# Patient Record
Sex: Female | Born: 1945
Health system: Southern US, Community
[De-identification: ages and names within clinical notes are randomized; demographics above are authoritative.]

## PROBLEM LIST (undated history)

## (undated) DIAGNOSIS — R7611 Nonspecific reaction to tuberculin skin test without active tuberculosis: Secondary | ICD-10-CM

## (undated) DIAGNOSIS — Z8719 Personal history of other diseases of the digestive system: Secondary | ICD-10-CM

## (undated) DIAGNOSIS — K649 Unspecified hemorrhoids: Secondary | ICD-10-CM

## (undated) DIAGNOSIS — K219 Gastro-esophageal reflux disease without esophagitis: Secondary | ICD-10-CM

## (undated) DIAGNOSIS — R112 Nausea with vomiting, unspecified: Secondary | ICD-10-CM

## (undated) DIAGNOSIS — E785 Hyperlipidemia, unspecified: Secondary | ICD-10-CM

## (undated) DIAGNOSIS — Z808 Family history of malignant neoplasm of other organs or systems: Secondary | ICD-10-CM

## (undated) DIAGNOSIS — Z9889 Other specified postprocedural states: Secondary | ICD-10-CM

## (undated) DIAGNOSIS — R898 Other abnormal findings in specimens from other organs, systems and tissues: Secondary | ICD-10-CM

## (undated) DIAGNOSIS — I10 Essential (primary) hypertension: Secondary | ICD-10-CM

## (undated) DIAGNOSIS — C50919 Malignant neoplasm of unspecified site of unspecified female breast: Secondary | ICD-10-CM

## (undated) DIAGNOSIS — M8580 Other specified disorders of bone density and structure, unspecified site: Secondary | ICD-10-CM

## (undated) DIAGNOSIS — M1990 Unspecified osteoarthritis, unspecified site: Secondary | ICD-10-CM

## (undated) DIAGNOSIS — C50911 Malignant neoplasm of unspecified site of right female breast: Secondary | ICD-10-CM

## (undated) DIAGNOSIS — R0602 Shortness of breath: Secondary | ICD-10-CM

## (undated) DIAGNOSIS — K589 Irritable bowel syndrome without diarrhea: Secondary | ICD-10-CM

## (undated) DIAGNOSIS — Z9289 Personal history of other medical treatment: Secondary | ICD-10-CM

## (undated) HISTORY — DX: Other specified disorders of bone density and structure, unspecified site: M85.80

## (undated) HISTORY — PX: TONSILLECTOMY AND ADENOIDECTOMY: SUR1326

## (undated) HISTORY — DX: Unspecified osteoarthritis, unspecified site: M19.90

## (undated) HISTORY — PX: UVULOPALATOPHARYNGOPLASTY, TONSILLECTOMY AND SEPTOPLASTY: SHX2632

## (undated) HISTORY — DX: Malignant neoplasm of unspecified site of right female breast: C50.911

## (undated) HISTORY — DX: Other abnormal findings in specimens from other organs, systems and tissues: R89.8

## (undated) HISTORY — DX: Essential (primary) hypertension: I10

## (undated) HISTORY — DX: Hyperlipidemia, unspecified: E78.5

## (undated) HISTORY — DX: Family history of malignant neoplasm of other organs or systems: Z80.8

---

## 1966-01-02 HISTORY — PX: DILATION AND CURETTAGE OF UTERUS: SHX78

## 1974-01-02 HISTORY — PX: TUBAL LIGATION: SHX77

## 1990-01-02 HISTORY — PX: CHOLECYSTECTOMY: SHX55

## 1992-01-03 HISTORY — PX: ESOPHAGOGASTRODUODENOSCOPY: SHX1529

## 1993-01-02 DIAGNOSIS — C50919 Malignant neoplasm of unspecified site of unspecified female breast: Secondary | ICD-10-CM

## 1993-01-02 HISTORY — PX: BREAST BIOPSY: SHX20

## 1993-01-02 HISTORY — DX: Malignant neoplasm of unspecified site of unspecified female breast: C50.919

## 1993-01-02 HISTORY — PX: BREAST LUMPECTOMY: SHX2

## 1993-11-08 DIAGNOSIS — Z853 Personal history of malignant neoplasm of breast: Secondary | ICD-10-CM

## 1993-12-06 HISTORY — PX: COLONOSCOPY: SHX174

## 1997-01-02 DIAGNOSIS — R7611 Nonspecific reaction to tuberculin skin test without active tuberculosis: Secondary | ICD-10-CM

## 1997-01-02 HISTORY — DX: Nonspecific reaction to tuberculin skin test without active tuberculosis: R76.11

## 1997-06-11 ENCOUNTER — Encounter: Admission: RE | Admit: 1997-06-11 | Discharge: 1997-09-09 | Payer: Self-pay | Admitting: Hematology and Oncology

## 1998-06-01 ENCOUNTER — Other Ambulatory Visit: Admission: RE | Admit: 1998-06-01 | Discharge: 1998-06-01 | Payer: Self-pay | Admitting: Obstetrics and Gynecology

## 1998-08-23 ENCOUNTER — Other Ambulatory Visit: Admission: RE | Admit: 1998-08-23 | Discharge: 1998-08-23 | Payer: Self-pay | Admitting: Obstetrics and Gynecology

## 1998-11-30 ENCOUNTER — Ambulatory Visit (HOSPITAL_COMMUNITY): Admission: RE | Admit: 1998-11-30 | Discharge: 1998-11-30 | Payer: Self-pay | Admitting: Gastroenterology

## 1998-11-30 ENCOUNTER — Encounter (INDEPENDENT_AMBULATORY_CARE_PROVIDER_SITE_OTHER): Payer: Self-pay | Admitting: *Deleted

## 1998-11-30 HISTORY — PX: COLONOSCOPY: SHX174

## 1999-03-16 ENCOUNTER — Other Ambulatory Visit: Admission: RE | Admit: 1999-03-16 | Discharge: 1999-03-16 | Payer: Self-pay | Admitting: Obstetrics and Gynecology

## 2000-05-07 ENCOUNTER — Other Ambulatory Visit: Admission: RE | Admit: 2000-05-07 | Discharge: 2000-05-07 | Payer: Self-pay | Admitting: Obstetrics and Gynecology

## 2001-11-27 ENCOUNTER — Ambulatory Visit: Admission: RE | Admit: 2001-11-27 | Discharge: 2001-11-27 | Payer: Self-pay | Admitting: Cardiovascular Disease

## 2002-04-08 ENCOUNTER — Encounter (HOSPITAL_COMMUNITY): Admission: RE | Admit: 2002-04-08 | Discharge: 2002-05-08 | Payer: Self-pay | Admitting: Pulmonary Disease

## 2002-11-06 ENCOUNTER — Ambulatory Visit (HOSPITAL_COMMUNITY): Admission: RE | Admit: 2002-11-06 | Discharge: 2002-11-06 | Payer: Self-pay | Admitting: Pulmonary Disease

## 2003-06-03 ENCOUNTER — Encounter (HOSPITAL_COMMUNITY): Admission: RE | Admit: 2003-06-03 | Discharge: 2003-07-03 | Payer: Self-pay | Admitting: Pulmonary Disease

## 2003-07-03 ENCOUNTER — Encounter (HOSPITAL_COMMUNITY): Admission: RE | Admit: 2003-07-03 | Discharge: 2003-08-02 | Payer: Self-pay | Admitting: Pulmonary Disease

## 2003-11-18 ENCOUNTER — Ambulatory Visit (HOSPITAL_COMMUNITY): Admission: RE | Admit: 2003-11-18 | Discharge: 2003-11-18 | Payer: Self-pay | Admitting: Obstetrics and Gynecology

## 2004-11-30 ENCOUNTER — Ambulatory Visit (HOSPITAL_COMMUNITY): Admission: RE | Admit: 2004-11-30 | Discharge: 2004-11-30 | Payer: Self-pay | Admitting: Pulmonary Disease

## 2005-05-24 ENCOUNTER — Emergency Department (HOSPITAL_COMMUNITY): Admission: EM | Admit: 2005-05-24 | Discharge: 2005-05-24 | Payer: Self-pay | Admitting: Emergency Medicine

## 2005-12-04 ENCOUNTER — Ambulatory Visit (HOSPITAL_COMMUNITY): Admission: RE | Admit: 2005-12-04 | Discharge: 2005-12-04 | Payer: Self-pay | Admitting: Pulmonary Disease

## 2005-12-28 ENCOUNTER — Ambulatory Visit (HOSPITAL_COMMUNITY): Admission: RE | Admit: 2005-12-28 | Discharge: 2005-12-28 | Payer: Self-pay | Admitting: Pulmonary Disease

## 2006-02-02 ENCOUNTER — Ambulatory Visit (HOSPITAL_COMMUNITY): Admission: RE | Admit: 2006-02-02 | Discharge: 2006-02-03 | Payer: Self-pay | Admitting: Otolaryngology

## 2006-02-02 ENCOUNTER — Encounter (INDEPENDENT_AMBULATORY_CARE_PROVIDER_SITE_OTHER): Payer: Self-pay | Admitting: Specialist

## 2006-02-19 ENCOUNTER — Observation Stay (HOSPITAL_COMMUNITY): Admission: EM | Admit: 2006-02-19 | Discharge: 2006-02-19 | Payer: Self-pay | Admitting: *Deleted

## 2006-03-01 ENCOUNTER — Ambulatory Visit (HOSPITAL_COMMUNITY): Admission: RE | Admit: 2006-03-01 | Discharge: 2006-03-01 | Payer: Self-pay | Admitting: Pulmonary Disease

## 2006-10-19 ENCOUNTER — Ambulatory Visit (HOSPITAL_COMMUNITY): Admission: RE | Admit: 2006-10-19 | Discharge: 2006-10-19 | Payer: Self-pay | Admitting: Pulmonary Disease

## 2006-10-26 ENCOUNTER — Ambulatory Visit (HOSPITAL_COMMUNITY): Admission: RE | Admit: 2006-10-26 | Discharge: 2006-10-26 | Payer: Self-pay | Admitting: Otolaryngology

## 2006-11-16 ENCOUNTER — Ambulatory Visit: Admission: RE | Admit: 2006-11-16 | Discharge: 2006-11-16 | Payer: Self-pay | Admitting: Otolaryngology

## 2006-11-26 ENCOUNTER — Ambulatory Visit (HOSPITAL_COMMUNITY): Admission: RE | Admit: 2006-11-26 | Discharge: 2006-11-26 | Payer: Self-pay | Admitting: Gastroenterology

## 2006-11-26 ENCOUNTER — Encounter (INDEPENDENT_AMBULATORY_CARE_PROVIDER_SITE_OTHER): Payer: Self-pay | Admitting: Gastroenterology

## 2006-11-26 HISTORY — PX: COLONOSCOPY: SHX5424

## 2006-12-06 ENCOUNTER — Ambulatory Visit: Payer: Self-pay | Admitting: Pulmonary Disease

## 2006-12-10 ENCOUNTER — Ambulatory Visit (HOSPITAL_COMMUNITY): Admission: RE | Admit: 2006-12-10 | Discharge: 2006-12-10 | Payer: Self-pay | Admitting: Obstetrics and Gynecology

## 2008-01-21 ENCOUNTER — Ambulatory Visit (HOSPITAL_COMMUNITY): Admission: RE | Admit: 2008-01-21 | Discharge: 2008-01-21 | Payer: Self-pay | Admitting: Obstetrics and Gynecology

## 2009-01-28 ENCOUNTER — Ambulatory Visit (HOSPITAL_COMMUNITY): Admission: RE | Admit: 2009-01-28 | Discharge: 2009-01-28 | Payer: Self-pay | Admitting: Obstetrics and Gynecology

## 2010-01-31 ENCOUNTER — Ambulatory Visit (HOSPITAL_COMMUNITY)
Admission: RE | Admit: 2010-01-31 | Discharge: 2010-01-31 | Payer: Self-pay | Source: Home / Self Care | Attending: Pulmonary Disease | Admitting: Pulmonary Disease

## 2010-03-21 ENCOUNTER — Other Ambulatory Visit (HOSPITAL_COMMUNITY): Payer: Self-pay | Admitting: Pulmonary Disease

## 2010-03-23 ENCOUNTER — Ambulatory Visit (HOSPITAL_COMMUNITY)
Admission: RE | Admit: 2010-03-23 | Discharge: 2010-03-23 | Disposition: A | Discharge status: Home / Self Care | Payer: 59 | Source: Ambulatory Visit | Attending: Pulmonary Disease | Admitting: Pulmonary Disease

## 2010-03-23 ENCOUNTER — Other Ambulatory Visit (HOSPITAL_COMMUNITY): Payer: Self-pay | Admitting: Pulmonary Disease

## 2010-03-23 DIAGNOSIS — Z853 Personal history of malignant neoplasm of breast: Secondary | ICD-10-CM | POA: Insufficient documentation

## 2010-03-23 DIAGNOSIS — N63 Unspecified lump in unspecified breast: Secondary | ICD-10-CM | POA: Insufficient documentation

## 2010-04-07 ENCOUNTER — Other Ambulatory Visit: Payer: Self-pay | Admitting: Surgery

## 2010-05-17 NOTE — Op Note (Signed)
NAMEDANA, DEBO              ACCOUNT NO.:  [id number]   MEDICAL RECORD NO.:  [id number]          PATIENT TYPE:  AMB   LOCATION:  ENDO                         FACILITY:  [facility]   PHYSICIAN:  John C. Madilyn Fireman, M.D.    DATE OF BIRTH:  1945/01/28   DATE OF PROCEDURE:  11/26/2006  DATE OF DISCHARGE:                               OPERATIVE REPORT   PROCEDURE:  Colonoscopy.   INDICATIONS FOR PROCEDURE:  Rectal bleeding in a patient with no colon  screening in the last 8 years.   PROCEDURE:  The patient was placed in the left lateral decubitus  position and placed on the pulse monitor, with continuous low-flow  oxygen delivered by nasal cannula.  She was sedated with 75 mcg IV  fentanyl and 7 mg IV Versed.  The Olympus video colonoscope was inserted  into the rectum and advanced to the cecum, confirmed by  transillumination at McBurney's point and visualization of the ileocecal  valve and appendiceal orifice.  Prep was excellent.  Cecum, ascending,  transverse, and descending colon all appeared normal, with no masses,  polyps, diverticula, or other mucosal abnormalities.  In the sigmoid  colon, there was an 8 mm polyp which was removed by snare.  The  remainder of the sigmoid and rectum appeared normal down to the anus,  where retroflexed view revealed some small internal hemorrhoids.  The  scope was then withdrawn, and the patient returned to the recovery room  in stable condition.  She tolerated the procedure well, and there were  no immediate complications.   IMPRESSION:  1. Sigmoid colon polyp.  2. Small internal hemorrhoids.   PLAN:  We will await histology to determine interval for future colon  screening.   Compartment           ______________________________  Everardo All. Madilyn Fireman, M.D.     J[facility]/MEDQ  D:  11/26/2006  T:  11/26/2006  Job:  [id number]   cc:   Ramon Dredge L. Juanetta Gosling, M.D.

## 2010-05-17 NOTE — Procedures (Signed)
Amy Houston, Amy Houston              ACCOUNT NO.:  [id number]   MEDICAL RECORD NO.:  [id number]          PATIENT TYPE:  OUT   LOCATION:  SLEEP LAB                     FACILITY:  [facility]   PHYSICIAN:  Barbaraann Share, MD,FCCPDATE OF BIRTH:  01/08/1946   DATE OF STUDY:  11/16/2006                            NOCTURNAL POLYSOMNOGRAM   REFERRING PHYSICIAN:  Suzanna Obey, M.D.   REFERRING PHYSICIAN:  Dr. Suzanna Obey.   INDICATIONS FOR STUDY:  Hypersomnia with sleep apnea.  The patient is  status post upper airway surgery.   EPWORTH SCORE:  18   SLEEP ARCHITECTURE:  The patient has a total sleep time of 376 minutes  with very little slow-wave sleep and also decreased REM.  Sleep onset  latency was normal and REM onset was prolonged at 324 minutes.  Sleep  efficiency was fairly good at 92%.   RESPIRATORY DATA:  The patient was found to have 3 obstructive apneas  and 32 obstructive hypopneas for an apnea-hypopnea index of 6 events per  hour.  She was also found to have 12 respiratory effort-related  arousals, which gave her a respiratory disturbance index of 7.5 events  per hour.  The events were not positional and there was mild to moderate  snoring throughout.  The patient did not meet split night criteria  secondary to the small numbers of events.   OXYGEN DATA:  There was O2 desaturation as low as 85% with the patient's  obstructive events.   CARDIAC DATA:  Rare PACs, but no clinically significant arrhythmias were  noted.   MOVEMENT/PARASOMNIA:  The patient was found to have moderate numbers of  leg jerks with no significant sleep disruption.   IMPRESSION/RECOMMENDATIONS:  1. Mild obstructive sleep apnea/hypopnea syndrome with an apnea-      hypopnea index of 6 events per hour and a respiratory disturbance      index when including respiratory effort related arousals to 7.5 per      hour.  Treatment for this degree of sleep apnea can include weight      loss, upper airway surgery,  oral appliance, and also continuous      positive airway pressure.  Clinical correlation is suggested to see      if this is impacting the patient's quality of life.  This mild      degree of sleep apnea is not a cardiovascular risk for the patient.      Split night criteria were not      met secondary to the small numbers of events.  2. Rare premature atrial contractions without clinically significant      arrhythmia.      Barbaraann Share, MD,FCCP  Diplomate, American Board of Sleep  Medicine  Electronically Signed     [facility]/MEDQ  D:  12/06/2006 04:54:09  T:  12/06/2006 09:56:32  Job:  [id number]

## 2010-05-20 NOTE — Op Note (Signed)
Amy Houston, Amy Houston              ACCOUNT NO.:  [id number]   MEDICAL RECORD NO.:  [id number]          PATIENT TYPE:  OIB   LOCATION:  2550                         FACILITY:  [facility]   PHYSICIAN:  Suzanna Obey, M.D.       DATE OF BIRTH:  01/29/1945   DATE OF PROCEDURE:  02/02/2006  DATE OF DISCHARGE:                               OPERATIVE REPORT   PREOPERATIVE DIAGNOSIS:  Obstructive sleep apnea.   POSTOPERATIVE DIAGNOSIS:  Obstructive sleep apnea.   SURGICAL PROCEDURES:  Septoplasty, submucous resection of inferior  turbinates, uvulopharyngeopalatoplasty, and tonsillectomy.   SURGEON:  Suzanna Obey, M.D.   ANESTHESIA:  General.   ESTIMATED BLOOD LOSS:  Approximately 15 mL.   INDICATIONS:  This is a 65 year old, who has had obstructive sleep apnea  that has been refractory to CPAP treatment.  The patient now requires  surgery because she is having quite a lot of symptoms with respect to  the sleep apnea and needs treatment.  She was informed of the risks and  benefits of the procedure, including bleeding, infection, velopharyngeal  insufficiency, change in the voice, chronic pain, septal perforation,  change in the external appearance of the nose, chronic crusting and  drying, numbness of the teeth, and risks of the anesthetic.  All  questions were answered and consent was obtained.   OPERATION:  The patient was taken to the operating room and placed in  supine position.  After adequate general endotracheal tube anesthesia,  she was placed in the supine position and draped in the usual sterile  manner.  The nose was injected with 1% lidocaine with 1:100,000  epinephrine in the septum and inferior turbinates, and then a right  hemitransfixion incision was performed, raising a mucoperichondrial and  ostial flap.  The cartilage was divided about 2 cm posterior to the  caudal strut and the cartilage was removed with a Therapist, nutritional.  The  Jansen-Middleton forceps were used to remove  the deviated portion of the  bone.  A 4-mm osteotome was used to remove the septal spur.  The  hemitransfixion incision was closed with interrupted 4-0 chromic and a 4-  0 plain gut quilting stitch placed through the septum. The turbinates  were outfractured.  A midline incision was made with a 15 blade.  A  mucosal flap was elevated superiorly, and the inferior mucosa and bone  were removed with a turbinate scissors.  The edge was cauterized with  suction cautery and both flaps were laid back down over the raw surface  and outfractured.  Telfa rolls soaked in Bactroban were placed into the  nose bilaterally and secured with a 3-0 nylon.  The patient then had the  Crowe-Davis mouth gag inserted, retracted and suspended from the Mayo  stand.  An incision was begun at the base of the uvula and carried into  the left tonsillar fossa, where the capsule of the tonsil was identified  and removed with electrocautery dissection.  Then, the dissection was  carried across the midline to the right side, where, again, the tonsil  was removed along its capsule with electrocautery  dissection.  The  tonsils and uvula were removed en bloc as 1 specimen.  The suction  cautery was used to gain hemostasis.  The palate and tonsillar fossae  were  closed with interrupted 3-0 Vicryl.  The hypopharynx, esophagus and  stomach were suctioned with an NG tube.  The Crowe-Davis was released  and resuspended, and there was hemostasis present in all locations.  The  patient was awakened and brought to recovery in stable condition.  Counts were correct.           ______________________________  Suzanna Obey, M.D.     JB/MEDQ  D:  02/02/2006  T:  02/02/2006  Job:  [id number]

## 2010-05-20 NOTE — Consult Note (Signed)
Amy Houston, Amy Houston              ACCOUNT NO.:  [id number]   MEDICAL RECORD NO.:  [id number]          PATIENT TYPE:  OBV   LOCATION:  2550                         FACILITY:  [facility]   PHYSICIAN:  Zola Button T. Lazarus Salines, M.D. DATE OF BIRTH:  1945-10-21   DATE OF CONSULTATION:  02/19/2006  DATE OF DISCHARGE:                                 CONSULTATION   CHIEF COMPLAINT:  Right epistaxis.   HISTORY:  A 65 year old white female underwent septoplasty, reduction of  turbinates, tonsillectomy, and uvulectomy on February1 for sleep  apnea.  She was observed overnight in the hospital.  She has been doing  nicely since then was of throat pain exceeding the nose pain.  She has  started to breath though the nose to some degree.  Beginning last night,  she had bleeding from the right side of the nose which was copious.  She  went to the [facility] emergency room after several interventions, they  were unable to get control and sent her down here for further  consideration.  She has no known bleeding tendencies.  With the advice  of Dr. Jearld Fenton' nurse, she has been taking Motrin for additional pain  control over the past week and admits to taking as much as 800 mg q.4 h.  Two or three days ago, prior to the nose bleed, she noted that her stool  had turned dark black.  No belly pain.  No nausea or vomiting.  No  bright red blood in the stool.   PHYSICAL EXAMINATION:  She has blood clots in the right nose and a small  amount of old blood in the left side.  Also some old blood in the  pharynx.  Mental status appropriate.  She is breathing comfortably  through the mouth.   IMPRESSION:  Postoperative epistaxis, right, probably from the  turbinate.   PLAN:  She has been manipulated several times overnight and would prefer  to have a general anesthesia.  I think this is reasonable.  She has been  unable to evacuate the clots voluntarily which we can do better in the  operating room.   I discussed the  proposed procedure, namely cautery under anesthesia,  right nose.  Risks and complications were discussed.  Questions were  answered and informed consent was obtained.  A routine preoperative  history and physical was recorded without contraindications.  She  understands and agrees with the discussion and plans.      Gloris Manchester. Lazarus Salines, M.D.  Electronically Signed     KTW/MEDQ  D:  02/19/2006  T:  02/19/2006  Job:  [id number]   cc:   Suzanna Obey, M.D.  Edward L. Juanetta Gosling, M.D.

## 2010-05-20 NOTE — Procedures (Signed)
[place]  Patient:    Amy Houston                      MRN: [id number] Proc. Date: 11/30/98 Adm. Date:  [id number] Attending:  Louie Bun CC:         Elvina Sidle, M.D.                           Procedure Report  NAME OF PROCEDURE: Colonoscopy with polypectomy.  ENDOSCOPIST: Everardo All. Madilyn Fireman, M.D.  INDICATIONS FOR PROCEDURE: Patient with intermittent rectal bleeding, history of breast cancer and no colon screening times five years.  DESCRIPTION OF PROCEDURE: The patient was placed in the left lateral decubitus position and placed on the pulse monitor with continuous low flow oxygen delivered via nasal cannula. The patient was sedated with 75 mg of intravenous Demerol and 7 mg of intravenous Versed.  The Olympus video colonoscope was inserted into the rectum and advanced to the cecum, confirmed by transillumination in McBurneys point and visualization of the ileocecal valve and appendiceal orifice.  The prep was excellent.  The cecum, ascending, transverse, descending, and sigmoid colon all appeared normal.  There were no masses, polyps, diverticula or other mucosal abnormalities. The rectum likewise appeared normal to about 5 cm from the anal verge where a 5 cm sessile polyp was seen and fulgurated by hot biopsy.  There were also some small hemorrhoids seen on retroflex view.  The colonoscope was then withdrawn.  The patient was taken to the Recovery Room in stable condition.  She tolerated he procedure well and there were no immediate complications.  IMPRESSION: 1. Small rectal polyp. 2. Internal hemorrhoids.  PLAN: 1. Treat hemorrhoids symptomatically. 2. We will await histology on the polyp. DD:  11/30/98 TD:  11/30/98 Job: [id number]

## 2010-05-20 NOTE — Op Note (Signed)
NAMENEGAR, SIELER              ACCOUNT NO.:  [id number]   MEDICAL RECORD NO.:  [id number]          PATIENT TYPE:  OBV   LOCATION:  2550                         FACILITY:  [facility]   PHYSICIAN:  Zola Button T. Lazarus Salines, M.D. DATE OF BIRTH:  [birth year]   DATE OF PROCEDURE:  02/19/2006  DATE OF DISCHARGE:                               OPERATIVE REPORT   PREOPERATIVE DIAGNOSIS:  Right postoperative epistaxis.   POSTOPERATIVE DIAGNOSIS:  Right postoperative epistaxis.   PROCEDURE PERFORMED:  Cautery under anesthesia, right inferior  turbinate.   SURGEON:  Gloris Manchester. Lazarus Salines, M.D.   ANESTHESIA:  General orotracheal.   BLOOD LOSS:  Minimal.   COMPLICATIONS:  None.   FINDINGS:  The right nose filled with the stiff gelatinous clots  attached to clots in the nasopharynx.  These were finally evacuated with  suction.  The septum is still somewhat edematous but basically straight.  Moderate maxillary crest spurring on both sides.  Surgical reduction of  the inferior turbinates.  Bleeding from the mid-posterior aspect of the  mucosal edges of the right inferior turbinate.   PROCEDURE:  With the patient in a comfortable supine position, general  orotracheal anesthesia was induced without difficulty.  At an  appropriate level, the patient was placed in a slight sitting position.  A saline-moistened throat pack was placed.  Using a 10 Frazier tip  suction, clots were evacuated from the right nose piecemeal and finally  from the right pharynx.  There was moderate oozing from the inferior  turbinate.  Using suction cautery, the mucosal edges of the posterior  and midportion of the right inferior turbinate were coagulated, with  control of bleeding.  She was observed for several minutes, with no  additional bleeding.  The left side showed some residual eschar along  the inferior turbinate, which was cleared away, and some early synechia  formation between the inferior turbinate and the maxillary  crest, which  was also lysed, but otherwise no attention required on the left side.   After observing hemostasis, the pharynx was suctioned free, and the  throat pack was removed.  The patient was returned to anesthesia,  awakened, extubated, and transferred to recovery in stable condition.   COMMENT:  A 65 year old white female, now 2 weeks status post surgery to  the nose and throat for sleep apnea with onset of brisk right-sided  epistaxis consistent with turbinate bleeding was the indication for  today's procedure.  Anticipate a routine postoperative recovery with attention to ice,  elevation, and analgesia.  No antibiosis required.  She will need to be  back in touch with Dr. Juanetta Gosling for evaluation of melena probably  secondary to nonsteroidal anti-inflammatory therapy.      Gloris Manchester. Lazarus Salines, M.D.  Electronically Signed     KTW/MEDQ  D:  02/19/2006  T:  02/19/2006  Job:  [id number]   cc:   Ramon Dredge L. Juanetta Gosling, M.D.  Suzanna Obey, M.D.

## 2010-08-01 ENCOUNTER — Other Ambulatory Visit (HOSPITAL_COMMUNITY): Payer: Self-pay | Admitting: Pulmonary Disease

## 2010-08-01 ENCOUNTER — Ambulatory Visit (HOSPITAL_COMMUNITY)
Admission: RE | Admit: 2010-08-01 | Discharge: 2010-08-01 | Disposition: A | Discharge status: Home / Self Care | Payer: 59 | Source: Ambulatory Visit | Attending: Pulmonary Disease | Admitting: Pulmonary Disease

## 2010-08-01 DIAGNOSIS — M25569 Pain in unspecified knee: Secondary | ICD-10-CM | POA: Insufficient documentation

## 2010-08-01 DIAGNOSIS — M25561 Pain in right knee: Secondary | ICD-10-CM

## 2010-08-01 DIAGNOSIS — M6740 Ganglion, unspecified site: Secondary | ICD-10-CM | POA: Insufficient documentation

## 2010-08-03 ENCOUNTER — Other Ambulatory Visit (HOSPITAL_COMMUNITY): Payer: 59

## 2010-08-18 ENCOUNTER — Ambulatory Visit (INDEPENDENT_AMBULATORY_CARE_PROVIDER_SITE_OTHER): Payer: 59 | Admitting: Orthopedic Surgery

## 2010-08-18 ENCOUNTER — Encounter: Payer: Self-pay | Admitting: Orthopedic Surgery

## 2010-08-18 VITALS — Resp 16 | Ht 60.0 in | Wt 164.0 lb

## 2010-08-18 DIAGNOSIS — M7120 Synovial cyst of popliteal space [Baker], unspecified knee: Secondary | ICD-10-CM | POA: Insufficient documentation

## 2010-08-18 NOTE — Progress Notes (Signed)
CONSULT: REQUESTED BY Dr Shaune Pollack  Chief complaint: RIGHT knee pain HPI:(4) The patient reports the sudden onset of pain in the RIGHT knee after getting out of a chair back in July on the 28th.  Otherwise there is no other injury.  For approximately 1 week she took Aleve 3 times a day.  Initially had throbbing and burning with 8/10 pain which was worse at night.  The pain tends to come and go now and is worse when she is lying flat and or walking.  The pain is better when she is bending the knee.  ROS:(2)   She says she feels some tingling in her toes.She does have some wandering in her eyes.  She has some heart palpitations and heartburn.  Stiffness of the joint is reported as well.  PFSH: (1)   Diabetes diet controlled.  Spine arthritis.  Status postcholecystectomy and lumpectomy.  Physical Exam(12) GENERAL: normal development   CDV: pulses are normal   Skin: normal  Lymph: nodes were not palpable/normal  Psychiatric: awake, alert and oriented  Neuro: normal sensation  MSK RIGHT knee evaluation.  Ambulation is normal. 1 Inspection reveals no tenderness along the joint lines but there is some tenderness over the iliotibial band and over the popliteal tendon 2 Knee range of motion is normal 3 Strength is normal 4 Stability is normal 5 McMurray's test is negative  Imaging: MRI [facility]:   Small nonspecific joint effusion with a small ganglion cyst at  the posterior lateral corner which I suspect is leaking. The  possibility of synovitis should be considered since there is no  apparent internal derangement of the knee.   X-rays knee ( office) Very minimal degenerative changes with symmetric mild joint space no narrowing and normal alignment  Separately identifiable x-ray report  Knee pain  AP lateral and sunrise view of the patella  The joint alignment is normal.  The bone quality is normal. The joint spaces are preserved  The impression is normal knee.   Assessment:  Resolving popliteal cyst    Plan: Observation

## 2010-11-01 ENCOUNTER — Other Ambulatory Visit (HOSPITAL_COMMUNITY): Payer: Self-pay | Admitting: Pulmonary Disease

## 2010-11-01 DIAGNOSIS — Z09 Encounter for follow-up examination after completed treatment for conditions other than malignant neoplasm: Secondary | ICD-10-CM

## 2010-11-02 ENCOUNTER — Ambulatory Visit (HOSPITAL_COMMUNITY)
Admission: RE | Admit: 2010-11-02 | Discharge: 2010-11-02 | Disposition: A | Discharge status: Home / Self Care | Payer: 59 | Source: Ambulatory Visit | Attending: Dermatology | Admitting: Dermatology

## 2010-11-02 ENCOUNTER — Other Ambulatory Visit (HOSPITAL_COMMUNITY): Payer: Self-pay | Admitting: Dermatology

## 2010-11-02 DIAGNOSIS — Z09 Encounter for follow-up examination after completed treatment for conditions other than malignant neoplasm: Secondary | ICD-10-CM

## 2010-11-02 DIAGNOSIS — Z853 Personal history of malignant neoplasm of breast: Secondary | ICD-10-CM | POA: Insufficient documentation

## 2010-11-02 DIAGNOSIS — L940 Localized scleroderma [morphea]: Secondary | ICD-10-CM

## 2010-11-02 DIAGNOSIS — N6489 Other specified disorders of breast: Secondary | ICD-10-CM | POA: Insufficient documentation

## 2011-02-15 ENCOUNTER — Encounter: Payer: Self-pay | Admitting: *Deleted

## 2011-02-15 ENCOUNTER — Encounter: Payer: 59 | Attending: Pulmonary Disease | Admitting: *Deleted

## 2011-02-15 NOTE — Patient Instructions (Signed)
Patient will attend Core Diabetes Courses II and III as scheduled or follow up prn.  

## 2011-02-15 NOTE — Progress Notes (Signed)
  Patient was seen on 02/15/2011 for the first of a series of three diabetes self-management courses at the [facility]. The following learning objectives were met by the patient during this course:   Defines the role of glucose and insulin  Identifies type of diabetes and pathophysiology  Defines the diagnostic criteria for diabetes and prediabetes  States the risk factors for Type 2 Diabetes  States the symptoms of Type 2 Diabetes  Defines Type 2 Diabetes treatment goals  Defines Type 2 Diabetes treatment options  States the rationale for glucose monitoring  Identifies A1C, glucose targets, and testing times  Identifies proper sharps disposal  Defines the purpose of a diabetes food plan  Identifies carbohydrate food groups  Defines effects of carbohydrate foods on glucose levels  Identifies carbohydrate choices/grams/food labels  States benefits of physical activity and effect on glucose  Review of suggested activity guidelines  Last A1c: 6.5% (per [facility] referral)  Handouts given during class include:  Type 2 Diabetes: Basics Book  My Food Plan Book  Food and Activity Log  Patient has established the following initial goals:  Increase exercise.  Follow a diabetes meal plan.  Lose weight.  Follow-Up Plan: Patient will attend Core Diabetes Courses II and III March 2013.

## 2011-02-17 ENCOUNTER — Encounter: Payer: Self-pay | Admitting: *Deleted

## 2011-03-16 ENCOUNTER — Encounter: Payer: 59 | Attending: Pulmonary Disease | Admitting: *Deleted

## 2011-03-17 ENCOUNTER — Encounter: Payer: Self-pay | Admitting: *Deleted

## 2011-03-17 NOTE — Progress Notes (Signed)
  Patient was seen on 03/16/2011 for the second of a series of three diabetes self-management courses at the [facility]. The following learning objectives were met by the patient during this course:   Explain basic nutrition maintenance and quality assurance  Describe causes, symptoms and treatment of hypoglycemia and hyperglycemia  Explain how to manage diabetes during illness  Describe the importance of good nutrition for health and healthy eating strategies  List strategies to follow meal plan when dining out  Describe the effects of alcohol on glucose and how to use it safely  Describe problem solving skills for day-to-day glucose challenges  Describe strategies to use when treatment plan needs to change  Identify important factors involved in successful weight loss  Describe ways to remain physically active  Describe the impact of regular activity on insulin resistance  Patient updated their initials goals from Core Class I to include:  Increase my activity at least 5 days a week  Be active 15 minutes or more 3 days a week with Walking Tape  To help manage my stress I will do quiet time 3 times a week   Handouts given in class:  Refrigerator magnet for Sick Day Guidelines  [facility] Oral medication/insulin handout  Follow-Up Plan: Patient will attend the final class of the ADA Diabetes Self-Care Education.

## 2011-03-23 ENCOUNTER — Encounter: Payer: 59 | Admitting: *Deleted

## 2011-03-23 ENCOUNTER — Encounter: Payer: Self-pay | Admitting: *Deleted

## 2011-03-23 NOTE — Progress Notes (Signed)
  Patient was seen on 03/23/2011 for the third of a series of three diabetes self-management courses at the [facility]. The following learning objectives were met by the patient during this course:    Describe how diabetes changes over time   Identify diabetes complications and ways to prevent them   Describe strategies that can promote heart health including lowering blood pressure and cholesterol   Describe strategies to lower dietary fat and sodium in the diet   Identify physical activities that benefit cardiovascular health   Evaluate success in meeting personal goal   Describe the belief that they can live successfully with diabetes day to day   Establish 2-3 goals that they will plan to diligently work on until they return for the free 58-month follow-up visit  The following handouts were given in class:  3 Month Follow Up Visit handout  Goal setting handout  Class evaluation form  Your patient has established the following 3 month goals for diabetes self-care:  Count carbs at most of my meals and snacks      Follow-Up Plan: Patient will attend a 3 month follow-up visit for diabetes self-management education.

## 2011-06-13 LAB — HM DIABETES EYE EXAM

## 2012-02-09 ENCOUNTER — Other Ambulatory Visit (HOSPITAL_COMMUNITY): Payer: Self-pay | Admitting: Pulmonary Disease

## 2012-02-09 DIAGNOSIS — Z139 Encounter for screening, unspecified: Secondary | ICD-10-CM

## 2012-02-12 ENCOUNTER — Ambulatory Visit (HOSPITAL_COMMUNITY): Payer: 59

## 2012-02-12 ENCOUNTER — Ambulatory Visit (HOSPITAL_COMMUNITY)
Admission: RE | Admit: 2012-02-12 | Discharge: 2012-02-12 | Disposition: A | Discharge status: Home / Self Care | Payer: Medicare Other | Source: Ambulatory Visit | Attending: Pulmonary Disease | Admitting: Pulmonary Disease

## 2012-02-12 DIAGNOSIS — Z139 Encounter for screening, unspecified: Secondary | ICD-10-CM

## 2012-02-12 DIAGNOSIS — Z1231 Encounter for screening mammogram for malignant neoplasm of breast: Secondary | ICD-10-CM | POA: Insufficient documentation

## 2012-02-20 ENCOUNTER — Other Ambulatory Visit: Payer: Self-pay | Admitting: Pulmonary Disease

## 2012-02-20 DIAGNOSIS — R928 Other abnormal and inconclusive findings on diagnostic imaging of breast: Secondary | ICD-10-CM

## 2012-02-21 ENCOUNTER — Other Ambulatory Visit: Payer: Self-pay | Admitting: Pulmonary Disease

## 2012-02-21 DIAGNOSIS — R928 Other abnormal and inconclusive findings on diagnostic imaging of breast: Secondary | ICD-10-CM

## 2012-02-28 ENCOUNTER — Ambulatory Visit
Admission: RE | Admit: 2012-02-28 | Discharge: 2012-02-28 | Disposition: A | Discharge status: Home / Self Care | Payer: Medicare Other | Source: Ambulatory Visit | Attending: Pulmonary Disease | Admitting: Pulmonary Disease

## 2012-02-28 ENCOUNTER — Other Ambulatory Visit: Payer: Self-pay | Admitting: Pulmonary Disease

## 2012-02-28 DIAGNOSIS — R928 Other abnormal and inconclusive findings on diagnostic imaging of breast: Secondary | ICD-10-CM

## 2012-03-02 HISTORY — PX: BREAST BIOPSY: SHX20

## 2012-03-04 ENCOUNTER — Ambulatory Visit
Admission: RE | Admit: 2012-03-04 | Discharge: 2012-03-04 | Disposition: A | Discharge status: Home / Self Care | Payer: Medicare Other | Source: Ambulatory Visit | Attending: Pulmonary Disease | Admitting: Pulmonary Disease

## 2012-03-04 DIAGNOSIS — R928 Other abnormal and inconclusive findings on diagnostic imaging of breast: Secondary | ICD-10-CM

## 2012-03-05 ENCOUNTER — Other Ambulatory Visit: Payer: Self-pay | Admitting: Pulmonary Disease

## 2012-03-05 DIAGNOSIS — C50911 Malignant neoplasm of unspecified site of right female breast: Secondary | ICD-10-CM

## 2012-03-06 ENCOUNTER — Other Ambulatory Visit (INDEPENDENT_AMBULATORY_CARE_PROVIDER_SITE_OTHER): Payer: Self-pay | Admitting: Surgery

## 2012-03-06 ENCOUNTER — Encounter (HOSPITAL_COMMUNITY): Payer: 59

## 2012-03-06 ENCOUNTER — Telehealth (INDEPENDENT_AMBULATORY_CARE_PROVIDER_SITE_OTHER): Payer: Self-pay | Admitting: General Surgery

## 2012-03-06 DIAGNOSIS — C50911 Malignant neoplasm of unspecified site of right female breast: Secondary | ICD-10-CM

## 2012-03-06 NOTE — Telephone Encounter (Signed)
MR order signed per Dr Jamey Ripa and faxed to [facility]. Confirmation received.

## 2012-03-06 NOTE — Telephone Encounter (Signed)
Message copied by Liliana Cline on Wed Mar 06, 2012  1:41 PM ------      Message from: Matilde Sprang      Created: Wed Mar 06, 2012  9:24 AM      Regarding: ? order MRI       Olegario Messier, [facility], called to ask if Dr. Jamey Ripa would like to order an MRI for this pt he will see for the first time on Monday?  Dr. Juanetta Gosling (referring MD) is out of the office until 03/11/12 and his office is closed.  The MRI has been prelimanarily scheduled already; they just need the actual order.  Please call Olegario Messier and advise:  [phone].  Thanks, BP ------

## 2012-03-08 ENCOUNTER — Other Ambulatory Visit: Payer: Medicare Other

## 2012-03-12 ENCOUNTER — Other Ambulatory Visit (INDEPENDENT_AMBULATORY_CARE_PROVIDER_SITE_OTHER): Payer: Self-pay | Admitting: Surgery

## 2012-03-12 ENCOUNTER — Telehealth (INDEPENDENT_AMBULATORY_CARE_PROVIDER_SITE_OTHER): Payer: Self-pay | Admitting: General Surgery

## 2012-03-12 ENCOUNTER — Ambulatory Visit
Admission: RE | Admit: 2012-03-12 | Discharge: 2012-03-12 | Disposition: A | Discharge status: Home / Self Care | Payer: Medicare Other | Source: Ambulatory Visit | Attending: Surgery | Admitting: Surgery

## 2012-03-12 ENCOUNTER — Ambulatory Visit (INDEPENDENT_AMBULATORY_CARE_PROVIDER_SITE_OTHER): Payer: MEDICARE | Admitting: Surgery

## 2012-03-12 ENCOUNTER — Encounter (INDEPENDENT_AMBULATORY_CARE_PROVIDER_SITE_OTHER): Payer: Self-pay | Admitting: Surgery

## 2012-03-12 VITALS — BP 160/82 | HR 72 | Temp 97.2°F | Resp 12 | Ht 60.0 in | Wt 171.0 lb

## 2012-03-12 DIAGNOSIS — C50911 Malignant neoplasm of unspecified site of right female breast: Secondary | ICD-10-CM

## 2012-03-12 DIAGNOSIS — C50919 Malignant neoplasm of unspecified site of unspecified female breast: Secondary | ICD-10-CM

## 2012-03-12 DIAGNOSIS — N6320 Unspecified lump in the left breast, unspecified quadrant: Secondary | ICD-10-CM | POA: Insufficient documentation

## 2012-03-12 DIAGNOSIS — N63 Unspecified lump in unspecified breast: Secondary | ICD-10-CM

## 2012-03-12 HISTORY — DX: Malignant neoplasm of unspecified site of right female breast: C50.911

## 2012-03-12 MED ORDER — GADOBENATE DIMEGLUMINE 529 MG/ML IV SOLN
15.0000 mL | Freq: Once | INTRAVENOUS | Status: AC | PRN
  Administered 2012-03-12: 15 mL via INTRAVENOUS

## 2012-03-12 NOTE — Patient Instructions (Signed)
We will arrange a "second look" ultrasound and possible biopsy of the other areas seen on the recent MRI

## 2012-03-12 NOTE — Telephone Encounter (Signed)
Left message for [facility] at [facility] 03/27/12 8 am

## 2012-03-12 NOTE — Progress Notes (Signed)
Patient ID: Amy Houston, female   DOB: Oct 19, 1945, 67 y.o.   MRN: [id number]  Chief Complaint  Patient presents with  . Breast Cancer    rt br DCIS    HPI Amy Houston is a 67 y.o. female.  She recent had a mammogram and a focal area of abnormality was found in the right lower outer quadrant of the breast. A biopsy was done showing DCIS receptor positive. In 1995 she had chemotherapy, lumpectomy, node dissection for breast cancer, invasive ductal, of the upper outer quadrant of the left breast. She has been apparently disease-free since then. She is not having any current breast symptoms. She did develop lymphedema after her radiation.  HPI  Past Medical History  Diagnosis Date  . Arthritis   . Diabetes mellitus   . HTN (hypertension)   . Anxiety and depression   . Sleep apnea   . Cancer 1995    Breast cancer    Past Surgical History  Procedure Laterality Date  . Gallbladder surgery  1992  . Breast lumpectomy  1995  . Tubal ligation  1976  . Tonsillectomy and adenoidectomy      Family History  Problem Relation Age of Onset  . Arthritis    . Cancer    . Diabetes    . Cancer Mother     lung  . Cancer Father     lung  . Cancer Sister     lung  . Cancer Brother     thyroid  . Cancer Brother     lymphoma    Social History History  Substance Use Topics  . Smoking status: Never Smoker   . Smokeless tobacco: Not on file  . Alcohol Use: No    Allergies  Allergen Reactions  . Penicillins Rash    Entire body covered in rash after taking Amoxicillin; told never to take any Penicillins again.    Current Outpatient Prescriptions  Medication Sig Dispense Refill  . LISINOPRIL PO Take 20 mg by mouth daily.       Marland Kitchen METOPROLOL SUCCINATE PO Take 25 mg by mouth daily.       . Venlafaxine HCl (EFFEXOR PO) Take 25 mg by mouth 2 (two) times daily.        No current facility-administered medications for this visit.    Review of Systems Review of Systems    Constitutional: Negative for fever, chills and unexpected weight change.  HENT: Negative for hearing loss, congestion, sore throat, trouble swallowing and voice change.   Eyes: Negative for visual disturbance.  Respiratory: Negative for cough and wheezing.   Cardiovascular: Negative for chest pain, palpitations and leg swelling.  Gastrointestinal: Negative for nausea, vomiting, abdominal pain, diarrhea, constipation, blood in stool, abdominal distention and anal bleeding.  Genitourinary: Negative for hematuria, vaginal bleeding and difficulty urinating.  Musculoskeletal: Negative for arthralgias.  Skin: Negative for rash and wound.  Neurological: Negative for seizures, syncope and headaches.  Hematological: Negative for adenopathy. Does not bruise/bleed easily.  Psychiatric/Behavioral: Negative for confusion.    Blood pressure 160/82, pulse 72, temperature 97.2 F (36.2 C), temperature source Temporal, resp. rate 12, height 5' (1.524 m), weight 171 lb (77.565 kg).  Physical Exam Physical Exam  Vitals reviewed. Constitutional: She is oriented to person, place, and time. She appears well-developed and well-nourished. No distress.  HENT:  Head: Normocephalic and atraumatic.  Mouth/Throat: Oropharynx is clear and moist.  Eyes: Conjunctivae and EOM are normal. Pupils are equal, round, and reactive to  light. No scleral icterus.  Neck: Normal range of motion. Neck supple. No tracheal deviation present. No thyromegaly present.  Cardiovascular: Normal rate, regular rhythm, normal heart sounds and intact distal pulses.  Exam reveals no gallop and no friction rub.   No murmur heard. Pulmonary/Chest: Effort normal and breath sounds normal. No respiratory distress. She has no wheezes. She has no rales. Left breast exhibits no skin change. Breasts are asymmetrical.    Left breast pale and smaller secondary to radiation. No dominalnt mass  Abdominal: Soft. Bowel sounds are normal. She exhibits no  distension and no mass. There is no tenderness. There is no rebound and no guarding.  Musculoskeletal: Normal range of motion. She exhibits no edema and no tenderness.  Lymphadenopathy:    She has no cervical adenopathy.    She has no axillary adenopathy.       Right: No supraclavicular adenopathy present.       Left: No supraclavicular adenopathy present.  Neurological: She is alert and oriented to person, place, and time.  Skin: Skin is warm and dry. No rash noted. She is not diaphoretic. No erythema.     Mild left arm lymphedema  Psychiatric: She has a normal mood and affect. Her behavior is normal. Judgment and thought content normal.    Data Reviewed Mammogram and path noted. MRI shows a seconf worrisome area in the right, UOQ and an even more worrisome area in the left inferior,deeep and abutting the pectoral muscle  Assessment    Clinical Stage 0 right breast cancer, lower outer quadrant History of Left breast cancer, IDC, UOQ (1995) Suspicious area right UOQ Suspicious mass left breast FH cancer First breast cancer dx age 28     Plan    Will obtain second look sono of both areas and schedule genetic evaluation. Patient requests PET scan, but will wait to see result of these studies first.  She would like to see Dr Mariel Sleet for oncologist        STRECK,CHRISTIAN J 03/12/2012, 2:51 PM

## 2012-03-13 NOTE — Addendum Note (Signed)
Addended byLiliana Cline on: 03/13/2012 10:20 AM   Modules accepted: Orders

## 2012-03-14 ENCOUNTER — Telehealth: Payer: Self-pay | Admitting: *Deleted

## 2012-03-14 NOTE — Telephone Encounter (Signed)
Confirmed 05/20/12 genetic appt w/ pt.  Emailed Jade to make her aware.

## 2012-03-19 ENCOUNTER — Ambulatory Visit
Admission: RE | Admit: 2012-03-19 | Discharge: 2012-03-19 | Disposition: A | Discharge status: Home / Self Care | Payer: Medicare Other | Source: Ambulatory Visit | Attending: Surgery | Admitting: Surgery

## 2012-03-19 ENCOUNTER — Other Ambulatory Visit (INDEPENDENT_AMBULATORY_CARE_PROVIDER_SITE_OTHER): Payer: Self-pay | Admitting: Surgery

## 2012-03-19 DIAGNOSIS — N6320 Unspecified lump in the left breast, unspecified quadrant: Secondary | ICD-10-CM

## 2012-03-19 DIAGNOSIS — C50911 Malignant neoplasm of unspecified site of right female breast: Secondary | ICD-10-CM

## 2012-03-20 ENCOUNTER — Encounter (INDEPENDENT_AMBULATORY_CARE_PROVIDER_SITE_OTHER): Payer: Self-pay

## 2012-03-22 ENCOUNTER — Telehealth (INDEPENDENT_AMBULATORY_CARE_PROVIDER_SITE_OTHER): Payer: Self-pay | Admitting: General Surgery

## 2012-03-22 NOTE — Telephone Encounter (Signed)
Dr Manson Passey discussed with patient. Dr Jamey Ripa made aware - but final pathology not available for him to speak with patient about. Patient made aware of that and requested appointment for discussion. Appt made for next Tuesday. She would still like a call from Dr Jamey Ripa today if we get final path prior to the end of the day. I told her I would forward the message to Dr Jamey Ripa and he would call if he could.

## 2012-03-22 NOTE — Telephone Encounter (Signed)
Message copied by Liliana Cline on Fri Mar 22, 2012 11:11 AM ------      Message from: Consuelo Pandy      Created: Fri Mar 22, 2012  9:15 AM       Nedra Hai from [facility] called to let us know that a second cancer has been found in the right breast.  It is 7.3cm away from the first found.  Also a new area was found in the left breast but it is benign.  Dr. Manson Passey is going to call and speak with the patient regarding the new results so at this time patient is not aware.  Nedra Hai wanted to check to see if Streck MD would want to see patient again due to new findings, if so please don't call patient until later today since she is currently not aware.            Thanks      Haig Prophet ------

## 2012-03-26 ENCOUNTER — Encounter (INDEPENDENT_AMBULATORY_CARE_PROVIDER_SITE_OTHER): Payer: Self-pay | Admitting: Surgery

## 2012-03-26 ENCOUNTER — Ambulatory Visit (INDEPENDENT_AMBULATORY_CARE_PROVIDER_SITE_OTHER): Payer: Self-pay | Admitting: Surgery

## 2012-03-26 VITALS — BP 158/72 | HR 76 | Resp 16 | Ht 60.0 in | Wt 172.0 lb

## 2012-03-26 DIAGNOSIS — C50911 Malignant neoplasm of unspecified site of right female breast: Secondary | ICD-10-CM

## 2012-03-26 DIAGNOSIS — C50919 Malignant neoplasm of unspecified site of unspecified female breast: Secondary | ICD-10-CM

## 2012-03-26 NOTE — Patient Instructions (Signed)
We will arrange for a plastic surgeon consultation. Will see if we can get the data genetic counseling moved up. You will followup with her oncologist about recommendations for metastatic workup.

## 2012-03-26 NOTE — Progress Notes (Signed)
NAME: Amy Houston       DOB: 1945/04/07           DATE: 03/26/2012       OZH:[id number]  CC:  Chief Complaint  Patient presents with  . Breast Cancer    discuss surgery    HPI: office a few weeks ago with a newly diagnosed right breast cancer in the lower outer quadrant which appear to be DCIS. Her MRI showed a second suspicious area in the 10:00 position of the right breast the suspicious area in the left breast. She then had both of these areas biopsied.the left biopsy showed some fat necrosis and inflammatory changes. The right breast shows invasive ductal carcinoma with associated DCIS. She comes today to discuss her options. EXAM: Vital signs: BP 158/72  Pulse 76  Resp 16  Ht 5' (1.524 m)  Wt 172 lb (78.019 kg)  BMI 33.59 kg/m2  General: Patient alert, oriented, NAD   IMP: Multicentric right breast cancer  PLAN: had a long discussion with the patient and her family. We have a multicentric ductal carcinoma the right I think she should have a mastectomy. We discussed potential a reconstruction done immediately. She asked about a prophylactic left mastectomy. We discussed that. I did recommend one at this point in time. I do think it would be important to get her genetic testing done to make a decision. We'll try to elucidate up on that. Should like to talk to a plastic surgeon to see what evolves further reconstruction. She also is interested in following up with the oncologist prior to making a final decision about what to do. As noted her current pathology report is not have prognostic indicators yet. 30 minutes in direct face-to-face counseling about all these issues.  Machael Raine J 03/26/2012

## 2012-03-27 ENCOUNTER — Other Ambulatory Visit: Payer: Medicare Other

## 2012-03-27 ENCOUNTER — Telehealth: Payer: Self-pay | Admitting: *Deleted

## 2012-03-27 ENCOUNTER — Telehealth (INDEPENDENT_AMBULATORY_CARE_PROVIDER_SITE_OTHER): Payer: Self-pay | Admitting: General Surgery

## 2012-03-27 NOTE — Telephone Encounter (Signed)
Message copied by Liliana Cline on Wed Mar 27, 2012  9:46 AM ------      Message from: Isaias Sakai K      Created: Wed Mar 27, 2012  9:07 AM      Regarding: Dr Jamey Ripa      Contact: [phone]       Has information about plastic surgeon per you conversation on yesterday ------

## 2012-03-27 NOTE — Telephone Encounter (Signed)
Called pt to schedule a sooner genetic appt and confirmed 04/01/12 appt w/ pt.  Emailed Jani Files, Dr. Jamey Ripa & Dr. Alvy Bimler to make aware.

## 2012-03-27 NOTE — Telephone Encounter (Signed)
Spoke with patient. She wants to see Dr Stephens November. I she is going to check with her insurance to see if he is in network and call me back. She was made aware her ER/PR are not back yet but her HER 2 is negative. She is aware they will call her today about moving up her genetics appt.

## 2012-03-29 ENCOUNTER — Telehealth (INDEPENDENT_AMBULATORY_CARE_PROVIDER_SITE_OTHER): Payer: Self-pay | Admitting: General Surgery

## 2012-03-29 DIAGNOSIS — C50911 Malignant neoplasm of unspecified site of right female breast: Secondary | ICD-10-CM

## 2012-03-29 NOTE — Telephone Encounter (Signed)
Spoke with patient. She has decided she wants to see Dr Kelly Splinter for plastic surgery. We will make the referral. She was made aware that her ER/PR were positive and Ki67 at 60%. Questions answered about this. She will call with any additional questions.

## 2012-04-01 ENCOUNTER — Other Ambulatory Visit: Payer: Medicare Other

## 2012-04-01 ENCOUNTER — Ambulatory Visit (HOSPITAL_BASED_OUTPATIENT_CLINIC_OR_DEPARTMENT_OTHER): Payer: Medicare Other | Admitting: Genetic Counselor

## 2012-04-01 DIAGNOSIS — IMO0002 Reserved for concepts with insufficient information to code with codable children: Secondary | ICD-10-CM

## 2012-04-01 DIAGNOSIS — C50911 Malignant neoplasm of unspecified site of right female breast: Secondary | ICD-10-CM

## 2012-04-01 DIAGNOSIS — C50519 Malignant neoplasm of lower-outer quadrant of unspecified female breast: Secondary | ICD-10-CM

## 2012-04-01 DIAGNOSIS — Z807 Family history of other malignant neoplasms of lymphoid, hematopoietic and related tissues: Secondary | ICD-10-CM

## 2012-04-03 ENCOUNTER — Encounter: Payer: Self-pay | Admitting: Genetic Counselor

## 2012-04-03 NOTE — Progress Notes (Signed)
Dr.  Cyndia Bent requested a consultation for genetic counseling and risk assessment for Amy Houston, a 67 y.o. female, for discussion of her personal history of breast cancer and family history of lymphoma, leukemia, thyroid, lung and breast cancer. She presents to clinic today to discuss the possibility of a genetic predisposition to cancer, and to further clarify her risks, as well as her family members' risks for cancer.   HISTORY OF PRESENT ILLNESS: In 1996, at the age of 22, Amy Houston was diagnosed with breast cancer of the left breast.  This was treated with lumpectomy.  In 2014, at the age of 10 she was diagnosed with right breast cancer.    Past Medical History  Diagnosis Date  . Arthritis   . Diabetes mellitus   . HTN (hypertension)   . Anxiety and depression   . Sleep apnea   . Cancer 1995    Breast cancer  . Breast cancer, right breast, IDC, Multifocal 03/12/2012    Multifocal, 10 and 8 left breast     Past Surgical History  Procedure Laterality Date  . Gallbladder surgery  1992  . Breast lumpectomy  1995  . Tubal ligation  1976  . Tonsillectomy and adenoidectomy      History  Substance Use Topics  . Smoking status: Never Smoker   . Smokeless tobacco: Not on file  . Alcohol Use: Yes     Comment: occ    REPRODUCTIVE HISTORY AND PERSONAL RISK ASSESSMENT FACTORS: Menarche was at age 4.   Menopause at 52 Uterus Intact: Yes Ovaries Intact: Yes G3P2A1 , first live birth at age 16  She has not previously undergone treatment for infertility.   Never used OCPs   She has not used HRT in the past.    FAMILY HISTORY:  We obtained a detailed, 4-generation family history.  Significant diagnoses are listed below: Family History  Problem Relation Age of Onset  . Arthritis    . Cancer    . Diabetes    . Lung cancer Mother 79    smoker  . Lymphoma Father   . Leukemia Father   . Lung cancer Sister 50  . Thyroid cancer Brother 1  . Lymphoma  Brother 60  . Breast cancer Maternal Aunt     mother's paternal half sister  . Cancer Maternal Uncle     unknown type; mother's paternal half brother  . Lung cancer Maternal Grandmother   . Cancer Maternal Aunt     unknown cancer; mother's maternal half sister  The patient was diagnosed with breast cancer at age 84 and again ag 24.  She has three brothers and two sisters.  One sister died of lung cancer at 68, a brother died of thyroid cancer at 66 and another brother was diagnosed with lymphoma at 53.  Her father was diagnosed with leukemia and then eventually was diagnosed with lymphoma.  He had one brother who was diagnosed with a cancer (unknown type) in his 26s.  The patient's mother was a heavy smoker and was diagnosed with lung cancer at 69.  She has a paternal half sister who had breast cancer and a brother with an unknown cancer.  She also had a maternal half sister with an unknown cancer in her 34s.  There is no other reported history of cancer on either side of the family.  Patient's maternal ancestors are of unknown descent, and paternal ancestors are of unknown descent. There is no reported Ashkenazi  Jewish ancestry. There is no  known consanguinity.  GENETIC COUNSELING RISK ASSESSMENT, DISCUSSION, AND SUGGESTED FOLLOW UP: We reviewed the natural history and genetic etiology of sporadic, familial and hereditary cancer syndromes.  About 5-10% of breast cancer is hereditary.  Of this, about 85% is the result of a BRCA1 or BRCA2 mutation.  We reviewed the red flags of hereditary cancer syndromes and the dominant inheritance patterns.  If the BRCA testing is negative, we discussed that we could be testing for the wrong gene.  We discussed gene panels, and that several cancer genes that are associated with different cancers can be tested at the same time.  Because of the different types of cancer that are in the patient's family, we will consider one of the panel tests if she is negative for  BRCA mutations.   The patient's personal history of bilateral breast cancer and family history of thyroid, lymphoma, and breast cancer is suggestive of the following possible diagnosis: hereditary cancer syndrome  We discussed that identification of a hereditary cancer syndrome may help her care providers tailor the patients medical management. If a mutation indicating BRCA is detected in this case, the [employer] recommendations would include increased cancer surveillance and possible prophylactic surgery. If a mutation is detected, the patient will be referred back to the referring provider and to any additional appropriate care providers to discuss the relevant options.   If a mutation is not found in the patient, this will decrease the likelihood of a hereditary cancer syndroem as the explanation for her bilateral breast cancer. Cancer surveillance options would be discussed for the patient according to the appropriate standard National Comprehensive Cancer Network and American Cancer Society guidelines, with consideration of their personal and family history risk factors. In this case, the patient will be referred back to their care providers for discussions of management.   After considering the risks, benefits, and limitations, the patient provided informed consent for  the following  testing: [facility]isk through [facility].  Although [facility] called and indicated that she would have $3400 out of pocket because she has not met her deductible.  We will try to preauthorize her test through [facility]. Patient understands that the test result turn around time is longer through [place].  Per the patient's request, we will contact her by telephone to discuss these results. A follow up genetic counseling visit will be scheduled if indicated.  The patient was seen for a total of 60 minutes, greater than 50% of which was spent face-to-face counseling.  This plan is being  carried out per Dr. Barbaraann Share recommendations.  This note will also be sent to the referring provider via the electronic medical record. The patient will be supplied with a summary of this genetic counseling discussion as well as educational information on the discussed hereditary cancer syndromes following the conclusion of their visit.   Patient was discussed with Dr. Drue Second.   _______________________________________________________________________ For Office Staff:  Number of people involved in session: 1 Was an Intern/ student involved with case: no }

## 2012-04-05 ENCOUNTER — Telehealth (INDEPENDENT_AMBULATORY_CARE_PROVIDER_SITE_OTHER): Payer: Self-pay | Admitting: *Deleted

## 2012-04-05 NOTE — Telephone Encounter (Signed)
Patient called to state that there are some issues with the genetic testing and her insurance.  Patient wants to know if she wants too can she go ahead and move forward with a bilateral mastectomy with no reconstruction without having the genetic testing back.

## 2012-04-08 ENCOUNTER — Other Ambulatory Visit (INDEPENDENT_AMBULATORY_CARE_PROVIDER_SITE_OTHER): Payer: Self-pay | Admitting: Surgery

## 2012-04-08 ENCOUNTER — Other Ambulatory Visit (HOSPITAL_COMMUNITY): Payer: Self-pay | Admitting: Obstetrics and Gynecology

## 2012-04-08 DIAGNOSIS — Z780 Asymptomatic menopausal state: Secondary | ICD-10-CM

## 2012-04-08 DIAGNOSIS — C50911 Malignant neoplasm of unspecified site of right female breast: Secondary | ICD-10-CM

## 2012-04-08 DIAGNOSIS — Z139 Encounter for screening, unspecified: Secondary | ICD-10-CM

## 2012-04-08 NOTE — Telephone Encounter (Signed)
Spoke with patient and aware we are sending orders to our scheduling department. She wants to meet with Dr Stephens November instead of Dr Kelly Splinter since she can not see her until the end of [place]. I told her I would look into this and try to get her referred. She also states she did get the insurance straightened out with the genetic testing and she is going through with it but she does not want it to delay her surgery further. She will call back with any other questions.

## 2012-04-09 ENCOUNTER — Telehealth (INDEPENDENT_AMBULATORY_CARE_PROVIDER_SITE_OTHER): Payer: Self-pay | Admitting: General Surgery

## 2012-04-09 NOTE — Telephone Encounter (Signed)
Message copied by Liliana Cline on Tue Apr 09, 2012  9:43 AM ------      Message from: Docia Chuck      Created: Mon Apr 08, 2012  3:32 PM      Regarding: Please call pt       We have the pt sch for 07 May but she has some questions about how long before she will be able to do anything.  Can you please call her.  If she can not do these events we may need to change the date so please let me know after you have spoken to her.  Her cell number is [phone]. ------

## 2012-04-09 NOTE — Telephone Encounter (Signed)
Spoke with patient and she has an event on 05/15/2012 and 05/18/2012. Her surgery is scheduled for 05/08/2012. I made her aware she still may have drains in place and may not feel like going to an event a week after surgery and I would advise against it. She is going to think it over and contact surgery scheduling back when she has made up her mind about a surgery date. She will keep date she has for now.

## 2012-04-10 ENCOUNTER — Ambulatory Visit (HOSPITAL_COMMUNITY)
Admission: RE | Admit: 2012-04-10 | Discharge: 2012-04-10 | Disposition: A | Discharge status: Home / Self Care | Payer: Medicare Other | Source: Ambulatory Visit | Attending: Obstetrics and Gynecology | Admitting: Obstetrics and Gynecology

## 2012-04-10 DIAGNOSIS — Z780 Asymptomatic menopausal state: Secondary | ICD-10-CM

## 2012-04-10 DIAGNOSIS — Z853 Personal history of malignant neoplasm of breast: Secondary | ICD-10-CM | POA: Insufficient documentation

## 2012-04-10 DIAGNOSIS — Z139 Encounter for screening, unspecified: Secondary | ICD-10-CM

## 2012-04-10 DIAGNOSIS — M810 Age-related osteoporosis without current pathological fracture: Secondary | ICD-10-CM | POA: Insufficient documentation

## 2012-04-10 DIAGNOSIS — M899 Disorder of bone, unspecified: Secondary | ICD-10-CM | POA: Insufficient documentation

## 2012-04-10 DIAGNOSIS — E559 Vitamin D deficiency, unspecified: Secondary | ICD-10-CM | POA: Insufficient documentation

## 2012-04-23 ENCOUNTER — Telehealth (INDEPENDENT_AMBULATORY_CARE_PROVIDER_SITE_OTHER): Payer: Self-pay | Admitting: General Surgery

## 2012-04-23 NOTE — Telephone Encounter (Signed)
Called Dr Stephens November at [phone] to check on getting a note from patient's 04/17/2012 appt. They state patient cancelled appt because they were out of network.

## 2012-04-24 ENCOUNTER — Encounter (INDEPENDENT_AMBULATORY_CARE_PROVIDER_SITE_OTHER): Payer: Self-pay | Admitting: Surgery

## 2012-04-24 ENCOUNTER — Encounter (HOSPITAL_COMMUNITY): Payer: Self-pay | Admitting: Pharmacy Technician

## 2012-04-26 ENCOUNTER — Encounter (HOSPITAL_COMMUNITY)
Admission: RE | Admit: 2012-04-26 | Discharge: 2012-04-26 | Disposition: A | Discharge status: Home / Self Care | Payer: Medicare Other | Source: Ambulatory Visit | Attending: Surgery | Admitting: Surgery

## 2012-04-26 ENCOUNTER — Encounter (HOSPITAL_COMMUNITY): Payer: Self-pay

## 2012-04-26 ENCOUNTER — Ambulatory Visit (HOSPITAL_COMMUNITY)
Admission: RE | Admit: 2012-04-26 | Discharge: 2012-04-26 | Disposition: A | Discharge status: Home / Self Care | Payer: Medicare Other | Source: Ambulatory Visit | Attending: Anesthesiology | Admitting: Anesthesiology

## 2012-04-26 DIAGNOSIS — E119 Type 2 diabetes mellitus without complications: Secondary | ICD-10-CM | POA: Insufficient documentation

## 2012-04-26 DIAGNOSIS — I10 Essential (primary) hypertension: Secondary | ICD-10-CM | POA: Insufficient documentation

## 2012-04-26 DIAGNOSIS — Z01810 Encounter for preprocedural cardiovascular examination: Secondary | ICD-10-CM | POA: Insufficient documentation

## 2012-04-26 DIAGNOSIS — Z01818 Encounter for other preprocedural examination: Secondary | ICD-10-CM | POA: Insufficient documentation

## 2012-04-26 DIAGNOSIS — Z01812 Encounter for preprocedural laboratory examination: Secondary | ICD-10-CM | POA: Insufficient documentation

## 2012-04-26 DIAGNOSIS — C50919 Malignant neoplasm of unspecified site of unspecified female breast: Secondary | ICD-10-CM | POA: Insufficient documentation

## 2012-04-26 HISTORY — DX: Unspecified hemorrhoids: K64.9

## 2012-04-26 HISTORY — DX: Nonspecific reaction to tuberculin skin test without active tuberculosis: R76.11

## 2012-04-26 HISTORY — DX: Personal history of other medical treatment: Z92.89

## 2012-04-26 HISTORY — DX: Irritable bowel syndrome, unspecified: K58.9

## 2012-04-26 HISTORY — DX: Gastro-esophageal reflux disease without esophagitis: K21.9

## 2012-04-26 LAB — BASIC METABOLIC PANEL
CO2: 27 mEq/L (ref 19–32)
Calcium: 9.9 mg/dL (ref 8.4–10.5)
Chloride: 101 mEq/L (ref 96–112)
Glucose, Bld: 170 mg/dL — ABNORMAL HIGH (ref 70–99)
Potassium: 3.9 mEq/L (ref 3.5–5.1)
Sodium: 138 mEq/L (ref 135–145)

## 2012-04-26 LAB — CBC
HCT: 37.4 % (ref 36.0–46.0)
Hemoglobin: 12.8 g/dL (ref 12.0–15.0)
MCH: 31.2 pg (ref 26.0–34.0)
MCV: 91.2 fL (ref 78.0–100.0)
RBC: 4.1 MIL/uL (ref 3.87–5.11)
WBC: 8.2 10*3/uL (ref 4.0–10.5)

## 2012-04-26 LAB — SURGICAL PCR SCREEN: Staphylococcus aureus: NEGATIVE

## 2012-04-26 NOTE — Pre-Procedure Instructions (Addendum)
TAEJA DEBELLIS  04/26/2012   Your procedure is scheduled on:  Wednesday, May 7th.  Report to [facility] at 6:30AM.  Call this number if you have problems the morning of surgery: [phone]   Remember:   Do not eat food or drink liquids after midnight.   Take these medicines the morning of surgery with A SIP OF WATER:  metoprolol succinate (TOPROL-XL) venlafaxine [facility])   Stop taking Aspirin, Coumadin, Plavix, Effient and Herbal medications.  Do not take any NSAIDs ie: Ibuprofen,  Advil,Naproxen or any medication containing Aspirin.   Do not wear jewelry, make-up or nail polish.  Do not wear lotions, powders, or perfumes. You may wear deodorant.  Do not shave 48 hours prior to surgery.   Do not bring valuables to the hospital.  Contacts, dentures or bridgework may not be worn into surgery.  Leave suitcase in the car. After surgery it may be brought to your room.  For patients admitted to the hospital, checkout time is 11:00 AM the day of discharge.     Special Instructions: Shower using CHG 2 nights before surgery and the night before surgery.  If you shower the day of surgery use CHG.  Use special wash - you have one bottle of CHG for all showers.  You should use approximately 1/3 of the bottle for each shower.   Please read over the following fact sheets that you were given: Pain Booklet, Coughing and Deep Breathing and Surgical Site Infection Prevention

## 2012-04-30 ENCOUNTER — Encounter (HOSPITAL_COMMUNITY): Payer: Self-pay | Admitting: *Deleted

## 2012-05-03 ENCOUNTER — Ambulatory Visit (INDEPENDENT_AMBULATORY_CARE_PROVIDER_SITE_OTHER): Payer: Medicare Other | Admitting: Surgery

## 2012-05-03 ENCOUNTER — Encounter (INDEPENDENT_AMBULATORY_CARE_PROVIDER_SITE_OTHER): Payer: Self-pay | Admitting: Surgery

## 2012-05-03 VITALS — BP 142/66 | HR 76 | Temp 96.8°F | Resp 16 | Ht 60.0 in | Wt 170.0 lb

## 2012-05-03 DIAGNOSIS — C50911 Malignant neoplasm of unspecified site of right female breast: Secondary | ICD-10-CM

## 2012-05-03 DIAGNOSIS — C50919 Malignant neoplasm of unspecified site of unspecified female breast: Secondary | ICD-10-CM

## 2012-05-03 NOTE — Progress Notes (Signed)
NAME: Amy Houston       DOB: Mar 17, 1945           DATE: 05/03/2012       AVW:[id number]  CC:  Chief Complaint  Patient presents with  . Pre-op Exam    discuss masty    HPI: she was diagnosed a few weeks ago with multicentric right breast cancer, one area of invasive ductal and one area of DCIS. This prior history of a left breast cancer with a benign mass now in the left breast. She's elected to have bilateral mastectomies. She comes in for preoperative evaluation and consultation about her options.  EXAM: VITAL SIGNS:  BP 142/66  Pulse 76  Temp(Src) 96.8 F (36 C) (Oral)  Resp 16  Ht 5' (1.524 m)  Wt 170 lb (77.111 kg)  BMI 33.2 kg/m2  GENERAL:  The patient is alert, oriented, and generally healthy-appearing, NAD. Mood and affect are normal.  HEENT:  The head is normocephalic, the eyes nonicteric, the pupils were round regular and equal. EOMs are normal. Pharynx normal. Dentition good.  NECK:  The neck is supple and there are no masses or thyromegaly.  LUNGS: Normal respirations and clear to auscultation.  HEART: Regular rhythm, with no murmurs rubs or gallops. Pulses are intact carotid dorsalis pedis and posterior tibial. No significant varicosities are noted.  BREASTS:  the left breast is much smaller than the right due to her prior surgery and radiation. There is no palpable mass in either breast.  LYMPHATICS:no axillary or supraclavicular adenopathy noted  ABDOMEN: Soft, flat, and nontender. No masses or organomegaly is noted. No hernias are noted. Bowel sounds are normal.  EXTREMITIES:  Good range of motion, no edema.  IMP: multifocal right breast cancer, receptor positive; history of left breast cancer  PLAN: we plan bilateral total mastectomies with right sentinel lymph node evaluation and no reconstructions. I spent a lengthy time with the patient and her friend going overall of her questions reviewing options et Karie Soda.  [name]  J 05/03/2012

## 2012-05-06 ENCOUNTER — Telehealth: Payer: Self-pay | Admitting: Genetic Counselor

## 2012-05-06 NOTE — Telephone Encounter (Signed)
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  Patient decided to have a dbl mastectomy and will have that on Wed, 5/7.  She wants to bring family members in to discuss the test results so she will call them and coordinate a time.

## 2012-05-07 MED ORDER — CIPROFLOXACIN IN D5W 400 MG/200ML IV SOLN
400.0000 mg | INTRAVENOUS | Status: AC
  Administered 2012-05-08: 400 mg via INTRAVENOUS
  Filled 2012-05-07: qty 200

## 2012-05-08 ENCOUNTER — Observation Stay (HOSPITAL_COMMUNITY)
Admission: RE | Admit: 2012-05-08 | Discharge: 2012-05-09 | Disposition: A | Discharge status: Home / Self Care | Payer: Medicare Other | Source: Ambulatory Visit | Attending: Surgery | Admitting: Surgery

## 2012-05-08 ENCOUNTER — Encounter (HOSPITAL_COMMUNITY): Payer: Self-pay | Admitting: Anesthesiology

## 2012-05-08 ENCOUNTER — Encounter (HOSPITAL_COMMUNITY): Payer: Self-pay | Admitting: *Deleted

## 2012-05-08 ENCOUNTER — Encounter (HOSPITAL_COMMUNITY)
Admission: RE | Admit: 2012-05-08 | Discharge: 2012-05-08 | Disposition: A | Discharge status: Home / Self Care | Payer: Medicare Other | Source: Ambulatory Visit | Attending: Surgery | Admitting: Surgery

## 2012-05-08 ENCOUNTER — Ambulatory Visit (HOSPITAL_COMMUNITY): Payer: Medicare Other | Admitting: Anesthesiology

## 2012-05-08 ENCOUNTER — Encounter (HOSPITAL_COMMUNITY)
Admission: RE | Disposition: A | Discharge status: Home / Self Care | Payer: Self-pay | Source: Ambulatory Visit | Attending: Surgery

## 2012-05-08 DIAGNOSIS — Z170 Estrogen receptor positive status [ER+]: Secondary | ICD-10-CM | POA: Insufficient documentation

## 2012-05-08 DIAGNOSIS — C50911 Malignant neoplasm of unspecified site of right female breast: Secondary | ICD-10-CM

## 2012-05-08 DIAGNOSIS — Z853 Personal history of malignant neoplasm of breast: Secondary | ICD-10-CM | POA: Insufficient documentation

## 2012-05-08 DIAGNOSIS — I10 Essential (primary) hypertension: Secondary | ICD-10-CM | POA: Insufficient documentation

## 2012-05-08 DIAGNOSIS — E119 Type 2 diabetes mellitus without complications: Secondary | ICD-10-CM | POA: Insufficient documentation

## 2012-05-08 DIAGNOSIS — D0590 Unspecified type of carcinoma in situ of unspecified breast: Secondary | ICD-10-CM

## 2012-05-08 DIAGNOSIS — C50919 Malignant neoplasm of unspecified site of unspecified female breast: Principal | ICD-10-CM | POA: Insufficient documentation

## 2012-05-08 HISTORY — DX: Personal history of other diseases of the digestive system: Z87.19

## 2012-05-08 HISTORY — DX: Nausea with vomiting, unspecified: Z98.890

## 2012-05-08 HISTORY — PX: MASTECTOMY COMPLETE / SIMPLE W/ SENTINEL NODE BIOPSY: SUR846

## 2012-05-08 HISTORY — PX: SIMPLE MASTECTOMY WITH AXILLARY SENTINEL NODE BIOPSY: SHX6098

## 2012-05-08 HISTORY — PX: MASTECTOMY COMPLETE / SIMPLE: SUR845

## 2012-05-08 HISTORY — DX: Malignant neoplasm of unspecified site of unspecified female breast: C50.919

## 2012-05-08 HISTORY — DX: Shortness of breath: R06.02

## 2012-05-08 HISTORY — DX: Nausea with vomiting, unspecified: R11.2

## 2012-05-08 LAB — GLUCOSE, CAPILLARY: Glucose-Capillary: 124 mg/dL — ABNORMAL HIGH (ref 70–99)

## 2012-05-08 SURGERY — SIMPLE MASTECTOMY WITH AXILLARY SENTINEL NODE BIOPSY
Anesthesia: General | Site: Breast | Laterality: Right | Wound class: Clean

## 2012-05-08 MED ORDER — PROPOFOL 10 MG/ML IV BOLUS
INTRAVENOUS | Status: DC | PRN
  Administered 2012-05-08: 100 mg via INTRAVENOUS
  Administered 2012-05-08: 70 mg via INTRAVENOUS
  Administered 2012-05-08: 200 mg via INTRAVENOUS
  Administered 2012-05-08: 30 mg via INTRAVENOUS

## 2012-05-08 MED ORDER — LACTATED RINGERS IV SOLN
INTRAVENOUS | Status: DC | PRN
  Administered 2012-05-08 (×2): via INTRAVENOUS

## 2012-05-08 MED ORDER — LACTATED RINGERS IV SOLN
INTRAVENOUS | Status: DC | PRN
  Administered 2012-05-08: 09:00:00 via INTRAVENOUS

## 2012-05-08 MED ORDER — NEOSTIGMINE METHYLSULFATE 1 MG/ML IJ SOLN
INTRAMUSCULAR | Status: DC | PRN
  Administered 2012-05-08: 3 mg via INTRAVENOUS

## 2012-05-08 MED ORDER — ESMOLOL HCL 10 MG/ML IV SOLN
INTRAVENOUS | Status: DC | PRN
  Administered 2012-05-08 (×3): 10 mg via INTRAVENOUS

## 2012-05-08 MED ORDER — MIDAZOLAM HCL 5 MG/5ML IJ SOLN
INTRAMUSCULAR | Status: DC | PRN
  Administered 2012-05-08: 2 mg via INTRAVENOUS

## 2012-05-08 MED ORDER — METHYLENE BLUE 1 % INJ SOLN
INTRAMUSCULAR | Status: AC
  Filled 2012-05-08: qty 10

## 2012-05-08 MED ORDER — ARTIFICIAL TEARS OP OINT
TOPICAL_OINTMENT | OPHTHALMIC | Status: DC | PRN
  Administered 2012-05-08: 1 via OPHTHALMIC

## 2012-05-08 MED ORDER — OXYCODONE HCL 5 MG PO TABS: 5.0000 mg | ORAL_TABLET | Freq: Once | ORAL | Status: DC | PRN

## 2012-05-08 MED ORDER — ROCURONIUM BROMIDE 100 MG/10ML IV SOLN
INTRAVENOUS | Status: DC | PRN
  Administered 2012-05-08: 50 mg via INTRAVENOUS

## 2012-05-08 MED ORDER — KCL IN DEXTROSE-NACL 20-5-0.45 MEQ/L-%-% IV SOLN
INTRAVENOUS | Status: DC
  Administered 2012-05-08: 16:00:00 via INTRAVENOUS
  Filled 2012-05-08 (×5): qty 1000

## 2012-05-08 MED ORDER — OXYCODONE-ACETAMINOPHEN 5-325 MG PO TABS
1.0000 | ORAL_TABLET | ORAL | Status: DC | PRN
  Administered 2012-05-09: 2 via ORAL
  Administered 2012-05-09: 1 via ORAL
  Filled 2012-05-08: qty 2
  Filled 2012-05-08: qty 1

## 2012-05-08 MED ORDER — LIDOCAINE HCL (CARDIAC) 20 MG/ML IV SOLN
INTRAVENOUS | Status: DC | PRN
  Administered 2012-05-08: 100 mg via INTRAVENOUS

## 2012-05-08 MED ORDER — ONDANSETRON HCL 4 MG/2ML IJ SOLN
4.0000 mg | Freq: Four times a day (QID) | INTRAMUSCULAR | Status: DC | PRN
  Administered 2012-05-08: 4 mg via INTRAVENOUS
  Filled 2012-05-08: qty 2

## 2012-05-08 MED ORDER — CIPROFLOXACIN IN D5W 400 MG/200ML IV SOLN
400.0000 mg | Freq: Two times a day (BID) | INTRAVENOUS | Status: AC
  Administered 2012-05-08: 400 mg via INTRAVENOUS
  Filled 2012-05-08 (×2): qty 200

## 2012-05-08 MED ORDER — HYDROMORPHONE HCL PF 1 MG/ML IJ SOLN
INTRAMUSCULAR | Status: AC
  Filled 2012-05-08: qty 1

## 2012-05-08 MED ORDER — SODIUM CHLORIDE 0.9 % IJ SOLN
INTRAMUSCULAR | Status: DC | PRN
  Administered 2012-05-08: 11:00:00

## 2012-05-08 MED ORDER — LIDOCAINE HCL 4 % MT SOLN
OROMUCOSAL | Status: DC | PRN
  Administered 2012-05-08: 4 mL via TOPICAL

## 2012-05-08 MED ORDER — 0.9 % SODIUM CHLORIDE (POUR BTL) OPTIME
TOPICAL | Status: DC | PRN
  Administered 2012-05-08: 1000 mL

## 2012-05-08 MED ORDER — METOPROLOL SUCCINATE ER 25 MG PO TB24
25.0000 mg | ORAL_TABLET | Freq: Every day | ORAL | Status: DC
  Administered 2012-05-08: 25 mg via ORAL
  Filled 2012-05-08 (×2): qty 1

## 2012-05-08 MED ORDER — CHLORHEXIDINE GLUCONATE 4 % EX LIQD: 1.0000 "application " | Freq: Once | CUTANEOUS | Status: DC

## 2012-05-08 MED ORDER — PHENOL 1.4 % MT LIQD
1.0000 | OROMUCOSAL | Status: DC | PRN
  Administered 2012-05-08: 1 via OROMUCOSAL
  Filled 2012-05-08: qty 177

## 2012-05-08 MED ORDER — HYDROMORPHONE HCL PF 1 MG/ML IJ SOLN
2.0000 mg | INTRAMUSCULAR | Status: DC | PRN
Start: 2012-05-08 — End: 2012-05-09
  Administered 2012-05-08: 2 mg via INTRAVENOUS
  Filled 2012-05-08: qty 2

## 2012-05-08 MED ORDER — EPHEDRINE SULFATE 50 MG/ML IJ SOLN
INTRAMUSCULAR | Status: DC | PRN
  Administered 2012-05-08 (×2): 10 mg via INTRAVENOUS

## 2012-05-08 MED ORDER — GLYCOPYRROLATE 0.2 MG/ML IJ SOLN
INTRAMUSCULAR | Status: DC | PRN
  Administered 2012-05-08: 0.2 mg via INTRAVENOUS
  Administered 2012-05-08: 0.6 mg via INTRAVENOUS

## 2012-05-08 MED ORDER — ONDANSETRON HCL 4 MG PO TABS
4.0000 mg | ORAL_TABLET | Freq: Four times a day (QID) | ORAL | Status: DC | PRN
  Administered 2012-05-09: 4 mg via ORAL
  Filled 2012-05-08: qty 1

## 2012-05-08 MED ORDER — HYDROMORPHONE HCL PF 1 MG/ML IJ SOLN
0.2500 mg | INTRAMUSCULAR | Status: DC | PRN
  Administered 2012-05-08 (×2): 0.5 mg via INTRAVENOUS

## 2012-05-08 MED ORDER — VENLAFAXINE HCL 50 MG PO TABS
50.0000 mg | ORAL_TABLET | Freq: Two times a day (BID) | ORAL | Status: DC
  Administered 2012-05-08 – 2012-05-09 (×3): 50 mg via ORAL
  Filled 2012-05-08 (×5): qty 1

## 2012-05-08 MED ORDER — HEPARIN SODIUM (PORCINE) 5000 UNIT/ML IJ SOLN
5000.0000 [IU] | Freq: Three times a day (TID) | INTRAMUSCULAR | Status: DC
  Administered 2012-05-09: 5000 [IU] via SUBCUTANEOUS
  Filled 2012-05-08 (×3): qty 1

## 2012-05-08 MED ORDER — FENTANYL CITRATE 0.05 MG/ML IJ SOLN
INTRAMUSCULAR | Status: DC | PRN
  Administered 2012-05-08: 50 ug via INTRAVENOUS
  Administered 2012-05-08: 100 ug via INTRAVENOUS
  Administered 2012-05-08: 50 ug via INTRAVENOUS
  Administered 2012-05-08: 100 ug via INTRAVENOUS

## 2012-05-08 MED ORDER — TECHNETIUM TC 99M SULFUR COLLOID FILTERED
1.0000 | Freq: Once | INTRAVENOUS | Status: AC | PRN
  Administered 2012-05-08: 1 via INTRADERMAL

## 2012-05-08 MED ORDER — ONDANSETRON HCL 4 MG/2ML IJ SOLN: 4.0000 mg | Freq: Four times a day (QID) | INTRAMUSCULAR | Status: DC | PRN

## 2012-05-08 MED ORDER — LISINOPRIL 20 MG PO TABS
20.0000 mg | ORAL_TABLET | Freq: Every day | ORAL | Status: DC
  Administered 2012-05-08: 20 mg via ORAL
  Filled 2012-05-08 (×2): qty 1

## 2012-05-08 MED ORDER — ONDANSETRON HCL 4 MG/2ML IJ SOLN
INTRAMUSCULAR | Status: DC | PRN
  Administered 2012-05-08 (×2): 4 mg via INTRAVENOUS

## 2012-05-08 MED ORDER — OXYCODONE HCL 5 MG/5ML PO SOLN: 5.0000 mg | Freq: Once | ORAL | Status: DC | PRN

## 2012-05-08 SURGICAL SUPPLY — 58 items
APPLIER CLIP 9.375 MED OPEN (MISCELLANEOUS) ×3
APPLIER CLIP 9.375 SM OPEN (CLIP) ×3
BINDER BREAST LRG (GAUZE/BANDAGES/DRESSINGS) ×6 IMPLANT
BINDER BREAST XLRG (GAUZE/BANDAGES/DRESSINGS) IMPLANT
BIOPATCH RED 1 DISK 7.0 (GAUZE/BANDAGES/DRESSINGS) ×9 IMPLANT
CANISTER SUCTION 2500CC (MISCELLANEOUS) ×3 IMPLANT
CHLORAPREP W/TINT 26ML (MISCELLANEOUS) ×6 IMPLANT
CLIP APPLIE 9.375 MED OPEN (MISCELLANEOUS) ×2 IMPLANT
CLIP APPLIE 9.375 SM OPEN (CLIP) ×2 IMPLANT
CLOTH BEACON ORANGE TIMEOUT ST (SAFETY) ×3 IMPLANT
CONT SPEC 4OZ CLIKSEAL STRL BL (MISCELLANEOUS) ×3 IMPLANT
COVER LIGHT HANDLE  DEROYL (MISCELLANEOUS) ×3 IMPLANT
COVER PROBE W GEL 5X96 (DRAPES) ×6 IMPLANT
COVER SURGICAL LIGHT HANDLE (MISCELLANEOUS) ×3 IMPLANT
DERMABOND ADHESIVE PROPEN (GAUZE/BANDAGES/DRESSINGS) ×1
DERMABOND ADVANCED (GAUZE/BANDAGES/DRESSINGS) ×1
DERMABOND ADVANCED .7 DNX12 (GAUZE/BANDAGES/DRESSINGS) ×2 IMPLANT
DERMABOND ADVANCED .7 DNX6 (GAUZE/BANDAGES/DRESSINGS) ×2 IMPLANT
DRAIN CHANNEL 19F RND (DRAIN) ×9 IMPLANT
DRAPE CHEST BREAST 15X10 FENES (DRAPES) ×3 IMPLANT
DRAPE PROXIMA HALF (DRAPES) ×3 IMPLANT
DRAPE SURG 17X23 STRL (DRAPES) ×12 IMPLANT
DRAPE UTILITY 15X26 W/TAPE STR (DRAPE) ×6 IMPLANT
DRSG PAD ABDOMINAL 8X10 ST (GAUZE/BANDAGES/DRESSINGS) ×3 IMPLANT
DRSG TEGADERM 4X4.75 (GAUZE/BANDAGES/DRESSINGS) ×3 IMPLANT
ELECT BLADE 4.0 EZ CLEAN MEGAD (MISCELLANEOUS) ×3
ELECT CAUTERY BLADE 6.4 (BLADE) ×3 IMPLANT
ELECT REM PT RETURN 9FT ADLT (ELECTROSURGICAL) ×3
ELECTRODE BLDE 4.0 EZ CLN MEGD (MISCELLANEOUS) ×2 IMPLANT
ELECTRODE REM PT RTRN 9FT ADLT (ELECTROSURGICAL) ×2 IMPLANT
EVACUATOR SILICONE 100CC (DRAIN) ×9 IMPLANT
GAUZE VASELINE 3X9 (GAUZE/BANDAGES/DRESSINGS) ×3 IMPLANT
GLOVE BIOGEL PI IND STRL 7.0 (GLOVE) ×8 IMPLANT
GLOVE BIOGEL PI INDICATOR 7.0 (GLOVE) ×4
GLOVE EUDERMIC 7 POWDERFREE (GLOVE) ×3 IMPLANT
GOWN PREVENTION PLUS XLARGE (GOWN DISPOSABLE) ×3 IMPLANT
GOWN STRL NON-REIN LRG LVL3 (GOWN DISPOSABLE) ×6 IMPLANT
KIT BASIN OR (CUSTOM PROCEDURE TRAY) ×3 IMPLANT
KIT ROOM TURNOVER OR (KITS) ×3 IMPLANT
NEEDLE 18GX1X1/2 (RX/OR ONLY) (NEEDLE) ×3 IMPLANT
NEEDLE HYPO 25GX1X1/2 BEV (NEEDLE) ×3 IMPLANT
NEEDLE SPNL 22GX3.5 QUINCKE BK (NEEDLE) ×3 IMPLANT
NS IRRIG 1000ML POUR BTL (IV SOLUTION) ×3 IMPLANT
PACK GENERAL/GYN (CUSTOM PROCEDURE TRAY) ×3 IMPLANT
PAD ARMBOARD 7.5X6 YLW CONV (MISCELLANEOUS) ×3 IMPLANT
SPECIMEN JAR X LARGE (MISCELLANEOUS) ×3 IMPLANT
SPONGE GAUZE 4X4 12PLY (GAUZE/BANDAGES/DRESSINGS) ×3 IMPLANT
SPONGE LAP 18X18 X RAY DECT (DISPOSABLE) ×6 IMPLANT
SPONGE LAP 4X18 X RAY DECT (DISPOSABLE) ×3 IMPLANT
STAPLER VISISTAT 35W (STAPLE) ×6 IMPLANT
SUT ETHILON 2 0 FS 18 (SUTURE) ×6 IMPLANT
SUT ETHILON 3 0 FSL (SUTURE) ×3 IMPLANT
SUT MNCRL AB 3-0 PS2 18 (SUTURE) ×3 IMPLANT
SUT VIC AB 3-0 SH 18 (SUTURE) ×3 IMPLANT
SYR 50ML LL SCALE MARK (SYRINGE) ×3 IMPLANT
SYR CONTROL 10ML LL (SYRINGE) ×3 IMPLANT
TOWEL OR 17X24 6PK STRL BLUE (TOWEL DISPOSABLE) ×3 IMPLANT
TOWEL OR 17X26 10 PK STRL BLUE (TOWEL DISPOSABLE) ×3 IMPLANT

## 2012-05-08 NOTE — Interval H&P Note (Signed)
History and Physical Interval Note:  05/08/2012 8:07 AM  Amy Houston  has presented today for surgery, with the diagnosis of right breast cancer, history left breast cancer  The various methods of treatment have been discussed with the patient and family. After consideration of risks, benefits and other options for treatment, the patient has consented to  Procedure(s): RIGHT TOTAL MASTECTOMY WITH AXILLARY SENTINEL NODE BIOPSY (Right) LEFT TOTAL MASTECTOMY (Left) as a surgical intervention .  The patient's history has been reviewed, patient examined, no change in status, stable for surgery.  I have reviewed the patient's chart and labs.  Questions were answered to the patient's satisfaction.    I have marked the right side as the side for the sentinel node. I discussed her genetic results briefly with her > she was already aware of them  [name]

## 2012-05-08 NOTE — Anesthesia Preprocedure Evaluation (Signed)
Anesthesia Evaluation  Patient identified by MRN, date of birth, ID band Patient awake    Reviewed: Allergy & Precautions, H&P , NPO status , Patient's Chart, lab work & pertinent test results  Airway Mallampati: II  Neck ROM: full    Dental   Pulmonary sleep apnea ,          Cardiovascular hypertension,     Neuro/Psych    GI/Hepatic GERD-  ,  Endo/Other  diabetes, Type 2  Renal/GU      Musculoskeletal  (+) Arthritis -,   Abdominal   Peds  Hematology   Anesthesia Other Findings   Reproductive/Obstetrics                           Anesthesia Physical Anesthesia Plan  ASA: II  Anesthesia Plan: General   Post-op Pain Management:    Induction: Intravenous  Airway Management Planned: Oral ETT  Additional Equipment:   Intra-op Plan:   Post-operative Plan: Extubation in OR  Informed Consent: I have reviewed the patients History and Physical, chart, labs and discussed the procedure including the risks, benefits and alternatives for the proposed anesthesia with the patient or authorized representative who has indicated his/her understanding and acceptance.     Plan Discussed with: CRNA, Anesthesiologist and Surgeon  Anesthesia Plan Comments:         Anesthesia Quick Evaluation

## 2012-05-08 NOTE — Op Note (Signed)
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 Axel Filler  Anesthesia: General   Clinical History and Indications: this patient was treated for a left breast cancer many years ago with lumpectomy and radiation. She has a resultant small contracted hard breast. A recent malady was seen on MRI but appeared to be fat necrosis. She also is found to have both DCIS and invasive ductal carcinoma in the right breast in separate quadrants. After discussion with the patient she elected to proceed to bilateral total mastectomies, right sentinel node excision, without reconstructions.    Description of Procedure: the patient is seen in the preoperative area in the right breast marked as the side for the sentinel node.the patient was then taken to the operating room and after satisfactory general endotracheal anesthesia was obtained a timeout was done. Of note is she was a difficult intubation. After the timeout was done I injected 5 cc of methylene blue in the subareolar area on the right. A full prep and drape was then done.  I did the left mastectomy first. I outlined an elliptical incision raise the usual skin flaps to sternum, clavicle, inframammary fold, and latissimus. There significant radiation changes in the subjacent tissue with edema. The breast was then removed from medial to lateral. There significant fibrotic change from the radiation here as well. Once the breast was disconnected I irrigated and made sure everything was dry. I placed a 19 Blake drain, irrigated again, and  closed staples.  I then approached the right side. I outlined an elliptical incision and made the superior incision. A recent superior skin flap until I could get into the axillary area were identified 3 sentinel lymph nodes which were sent to pathology. They eventually came back as all negative. They all seem to be soft and somewhat friable.  While waiting for the pathologist report on [name] and made the inferior incision and racing inferior flap and remove the breast from medial to lateral.I spent several minutes irrigating and making sure everything was dry. 219 Blake drains were placed and secured with 20 nylons. A final irrigation and checked for hemostasis was done and the wound was closed with staples. The pathologist reported the nodes were negative procedure was complete. Sterile dressings were applied. The patient tolerated the procedure well. Counts were correct.estimated blood loss was about 100 cc. There were no complications. Currie Paris, MD, FACS 05/08/2012 11:16 AM

## 2012-05-08 NOTE — Anesthesia Procedure Notes (Addendum)
Procedure Name: Intubation Date/Time: 05/08/2012 8:48 AM Performed by: Fransisca Kaufmann Pre-anesthesia Checklist: Patient identified, Emergency Drugs available, Suction available, Patient being monitored and Timeout performed Patient Re-evaluated:Patient Re-evaluated prior to inductionOxygen Delivery Method: Circle system utilized Preoxygenation: Pre-oxygenation with 100% oxygen Intubation Type: IV induction Ventilation: Mask ventilation without difficulty Laryngoscope Size: Miller, 2, Mac and 3 Grade View: Grade III Tube type: Oral Tube size: 6.5 mm Number of attempts: 4 Airway Equipment and Method: Stylet and Video-laryngoscopy Placement Confirmation: ETT inserted through vocal cords under direct vision,  positive ETCO2 and breath sounds checked- equal and bilateral Secured at: 21 cm Tube secured with: Tape Dental Injury: Teeth and Oropharynx as per pre-operative assessment  Difficulty Due To: Difficult Airway- due to reduced neck mobility, Difficult Airway- due to dentition, Difficult Airway- due to limited oral opening, Difficult Airway- due to anterior larynx and Difficulty was unanticipated Comments: Pt had decreased ROM, protruding upper dentition. E T Tube larger than 6.5 would not advance. Easy to ventilate with mask. Sao2 remained 98 to 100 %

## 2012-05-08 NOTE — Preoperative (Signed)
Beta Blockers   Reason not to administer Beta Blockers:Not Applicable 

## 2012-05-08 NOTE — Transfer of Care (Signed)
Immediate Anesthesia Transfer of Care Note  Patient: Amy Houston  Procedure(s) Performed: Procedure(s): RIGHT TOTAL MASTECTOMY WITH AXILLARY SENTINEL NODE BIOPSY (Right) LEFT TOTAL MASTECTOMY (Left)  Patient Location: PACU  Anesthesia Type:General  Level of Consciousness: awake, alert , oriented and sedated  Airway & Oxygen Therapy: Patient Spontanous Breathing and Patient connected to nasal cannula oxygen  Post-op Assessment: Report given to PACU RN, Post -op Vital signs reviewed and stable and Patient moving all extremities  Post vital signs: Reviewed and stable  Complications: No apparent anesthesia complications

## 2012-05-08 NOTE — H&P (View-Only) (Signed)
NAME: Amy Houston       DOB: 05/12/1945           DATE: 05/03/2012       MRN:[id number]  CC:  Chief Complaint  Patient presents with  . Pre-op Exam    discuss masty    HPI: she was diagnosed a few weeks ago with multicentric right breast cancer, one area of invasive ductal and one area of DCIS. This prior history of a left breast cancer with a benign mass now in the left breast. She's elected to have bilateral mastectomies. She comes in for preoperative evaluation and consultation about her options.  EXAM: VITAL SIGNS:  BP 142/66  Pulse 76  Temp(Src) 96.8 F (36 C) (Oral)  Resp 16  Ht 5' (1.524 m)  Wt 170 lb (77.111 kg)  BMI 33.2 kg/m2  GENERAL:  The patient is alert, oriented, and generally healthy-appearing, NAD. Mood and affect are normal.  HEENT:  The head is normocephalic, the eyes nonicteric, the pupils were round regular and equal. EOMs are normal. Pharynx normal. Dentition good.  NECK:  The neck is supple and there are no masses or thyromegaly.  LUNGS: Normal respirations and clear to auscultation.  HEART: Regular rhythm, with no murmurs rubs or gallops. Pulses are intact carotid dorsalis pedis and posterior tibial. No significant varicosities are noted.  BREASTS:  the left breast is much smaller than the right due to her prior surgery and radiation. There is no palpable mass in either breast.  LYMPHATICS:no axillary or supraclavicular adenopathy noted  ABDOMEN: Soft, flat, and nontender. No masses or organomegaly is noted. No hernias are noted. Bowel sounds are normal.  EXTREMITIES:  Good range of motion, no edema.  IMP: multifocal right breast cancer, receptor positive; history of left breast cancer  PLAN: we plan bilateral total mastectomies with right sentinel lymph node evaluation and no reconstructions. I spent a lengthy time with the patient and her friend going overall of her questions reviewing options et cetera.  Sawyer Mentzer  J 05/03/2012   

## 2012-05-09 ENCOUNTER — Encounter (HOSPITAL_COMMUNITY): Payer: Self-pay | Admitting: Surgery

## 2012-05-09 LAB — GLUCOSE, CAPILLARY
Glucose-Capillary: 168 mg/dL — ABNORMAL HIGH (ref 70–99)
Glucose-Capillary: 122 mg/dL — ABNORMAL HIGH (ref 70–99)
Glucose-Capillary: 118 mg/dL — ABNORMAL HIGH (ref 70–99)

## 2012-05-09 MED ORDER — LISINOPRIL 20 MG PO TABS: 20.0000 mg | ORAL_TABLET | Freq: Every day | ORAL | Status: DC

## 2012-05-09 MED ORDER — OXYCODONE-ACETAMINOPHEN 5-325 MG PO TABS: 1.0000 | ORAL_TABLET | ORAL | Status: DC | PRN

## 2012-05-09 NOTE — Progress Notes (Signed)
1 Day Post-Op   Assessment: s/p Procedure(s): RIGHT TOTAL MASTECTOMY WITH AXILLARY SENTINEL NODE BIOPSY LEFT TOTAL MASTECTOMY Patient Active Problem List   Diagnosis Date Noted  . Breast cancer, right breast, IDC, Multifocal 03/12/2012    Priority: High  . Breast mass, left 03/12/2012  . Popliteal cyst 08/18/2010  . Hx Left breast cancer, UOQ, IDC, receptor negative 11/08/1993    Overall doing well, BP (diastolic in particular) has been a bit low, but has been taken in leg and forearm. She has no hypotensive sx and systolic has been OK. Ambulating well, knnows JP drain management  Plan: Discharge If stable the rest of the AM Will hold AM Lisinopril  Subjective: Feels OK, ambulating, minimal pain, tolerating diet,  Objective: Vital signs in last 24 hours: Temp:  [97.3 F (36.3 C)-98.6 F (37 C)] 98.2 F (36.8 C) (05/08 0859) Pulse Rate:  [70-101] 83 (05/08 0859) Resp:  [9-24] 18 (05/08 0859) BP: (95-178)/(28-80) 129/40 mmHg (05/08 0859) SpO2:  [92 %-100 %] 98 % (05/08 0859) Weight:  [183 lb 10.3 oz (83.3 kg)] 183 lb 10.3 oz (83.3 kg) (05/07 1334)   Intake/Output from previous day: 05/07 0701 - 05/08 0700 In: 3007.5 [I.V.:3007.5] Out: 1295 [Urine:900; Drains:270; Blood:125]  General appearance: alert, cooperative and no distress Resp: clear to auscultation bilaterally  Incision: Dressing dry, JP's thin  Lab Results:  No results found for this basename: WBC, HGB, HCT, PLT,  in the last 72 hours BMET No results found for this basename: NA, K, CL, CO2, GLUCOSE, BUN, CREATININE, CALCIUM,  in the last 72 hours  MEDS, Scheduled . heparin  5,000 Units Subcutaneous Q8H  . lisinopril  20 mg Oral Daily  . metoprolol succinate  25 mg Oral Daily  . venlafaxine  50 mg Oral BID    Studies/Results: Nm Sentinel Node Inj-no Rpt (breast)  05/08/2012  CLINICAL DATA: right breast cancer   Sulfur colloid was injected intradermally by the nuclear medicine  technologist for breast  cancer sentinel node localization.        LOS: 1 day     Currie Paris, MD, [facility], [place] [phone]   05/09/2012 9:07 AM

## 2012-05-09 NOTE — Progress Notes (Signed)
Pt feeling much better this afternoon/evening.  Last BP 133/54  HR=90.  Minimal c/o pain.  Pt ambulating in the halls well.  Pt and husband given JP drain instructions/demonstrations and they verbalized understanding of JP care.  Also gave other discharge instructions and prescription for Percocet.  No questions at this time.  Pt taken down for discharge via wheelchair to go home with husband.

## 2012-05-09 NOTE — Progress Notes (Signed)
Pt's bp's taken repeatedly from left leg using dinamap have been low.  Attempted to take manually in LLE but unsure if reading is accurate.  Dr. Lindie Spruce notified of pt being hypotensive, of pt's iv that infiltrated, and that pt is tolerating fluid w/o n/v, and is voiding large amounts w/o difficulty.  Pt asymptomatic of low b/p.  Order received to take b/p in right arm.  Read to dr. Lindie Spruce about axillary sentinel node taken from rt extremity.  B/p reading from arm read 112/60.  Pt and daughter in room laughing and in high spirits.  No c/o pain from pt.

## 2012-05-09 NOTE — Anesthesia Postprocedure Evaluation (Signed)
Anesthesia Post Note  Patient: Amy Houston  Procedure(s) Performed: Procedure(s) (LRB): RIGHT TOTAL MASTECTOMY WITH AXILLARY SENTINEL NODE BIOPSY (Right) LEFT TOTAL MASTECTOMY (Left)  Anesthesia type: General  Patient location: PACU  Post pain: Pain level controlled and Adequate analgesia  Post assessment: Post-op Vital signs reviewed, Patient's Cardiovascular Status Stable, Respiratory Function Stable, Patent Airway and Pain level controlled  Last Vitals:  Filed Vitals:   05/09/12 0643  BP: 118/45  Pulse: 88  Temp: 36.8 C  Resp: 17    Post vital signs: Reviewed and stable  Level of consciousness: awake, alert  and oriented  Complications: No apparent anesthesia complications

## 2012-05-13 NOTE — Discharge Summary (Signed)
  Patient ID: Amy Houston [id number] 67 y.o. 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  Consults: None  Significant Diagnostic Studies: PATH Diagnosis 1. Lymph node, sentinel, biopsy, Right axillary #1 - THERE IS NO EVIDENCE OF CARCINOMA IN 1 OF 1 LYMPH NODE (0/1). 2. Lymph node, sentinel, biopsy, Right axillary #2 - THERE IS NO EVIDENCE OF CARCINOMA IN 1 OF 1 LYMPH NODE (0/1). 3. Lymph node, sentinel, biopsy, Right axillary #3 - THERE IS NO EVIDENCE OF CARCINOMA IN 1 OF 1 LYMPH NODE (0/1). 4. Breast, simple mastectomy, Left - FINDINGS SUGGESTIVE OF IGG-4 DISEASE. - BACKGROUND BENIGN BREAST PARENCHYMA. - SEE COMMENT. 5. Lymph nodes, regional resection, Right axillary - THERE IS NO EVIDENCE OF CARCINOMA IN 1 OF 1 LYMPH NODE (0/1). 6. Breast, simple mastectomy, Right - INVASIVE DUCTAL CARCINOMA, GRADE II/III, SPANNING 1.2 CM. - DUCTAL CARCINOMA IN SITU, HIGH GRADE. - LYMPHOVASCULAR INVASION IS IDENTIFIED. - THE SURGICAL RESECTION MARGINS ARE NEGATIVE FOR CARCINOMA. - SEE ONCOLOGY TABLE BELOW.   Disposition: Home

## 2012-05-14 ENCOUNTER — Encounter (INDEPENDENT_AMBULATORY_CARE_PROVIDER_SITE_OTHER): Payer: Self-pay | Admitting: Surgery

## 2012-05-14 ENCOUNTER — Ambulatory Visit (INDEPENDENT_AMBULATORY_CARE_PROVIDER_SITE_OTHER): Payer: Medicare Other | Admitting: Surgery

## 2012-05-14 ENCOUNTER — Encounter: Payer: Self-pay | Admitting: Genetic Counselor

## 2012-05-14 VITALS — BP 138/62 | HR 72 | Temp 97.2°F | Resp 16 | Ht 60.0 in | Wt 170.0 lb

## 2012-05-14 DIAGNOSIS — Z09 Encounter for follow-up examination after completed treatment for conditions other than malignant neoplasm: Secondary | ICD-10-CM

## 2012-05-14 NOTE — Patient Instructions (Addendum)
Keep an antibioitc ointment and sterile gauze on the drain sites See me Friday

## 2012-05-14 NOTE — Progress Notes (Signed)
CARNETTA LOSADA    [id number] 05/14/2012    30-Sep-1945   CC:   Chief Complaint  Patient presents with  . Routine Post Op    1st po bil maste     HPI:  The patient returns for post op follow-up. She underwent a bilataeral mastectomy and right SLN on 05/08/2012. Over all she feels that she is doing well. No particular problems  PE: VITAL SIGNS: BP 138/62  Pulse 72  Temp(Src) 97.2 F (36.2 C) (Temporal)  Resp 16  Ht 5' (1.524 m)  Wt 170 lb (77.111 kg)  BMI 33.2 kg/m2  Breast: The incision is healing nicely and there is no evidence of infection or hematoma.  The drains are still too much to remove, sero sang drainage.  DATA REVIEWED: Pathology report: Diagnosis 1. Lymph node, sentinel, biopsy, Right axillary #1 - THERE IS NO EVIDENCE OF CARCINOMA IN 1 OF 1 LYMPH NODE (0/1). 2. Lymph node, sentinel, biopsy, Right axillary #2 - THERE IS NO EVIDENCE OF CARCINOMA IN 1 OF 1 LYMPH NODE (0/1). 3. Lymph node, sentinel, biopsy, Right axillary #3 - THERE IS NO EVIDENCE OF CARCINOMA IN 1 OF 1 LYMPH NODE (0/1). 4. Breast, simple mastectomy, Left - FINDINGS SUGGESTIVE OF IGG-4 DISEASE. - BACKGROUND BENIGN BREAST PARENCHYMA. - SEE COMMENT. 5. Lymph nodes, regional resection, Right axillary - THERE IS NO EVIDENCE OF CARCINOMA IN 1 OF 1 LYMPH NODE (0/1). 6. Breast, simple mastectomy, Right - INVASIVE DUCTAL CARCINOMA, GRADE II/III, SPANNING 1.2 CM. - DUCTAL CARCINOMA IN SITU, HIGH GRADE. - LYMPHOVASCULAR INVASION IS IDENTIFIED. - THE SURGICAL RESECTION MARGINS ARE NEGATIVE FOR CARCINOMA. - SEE ONCOLOGY TABLE BELOW.  IMPRESSION: Patient doing well.   PLAN: Her next visit will be in three days to see if we can get at least one drain and some staples out. She will keep sterile guaze and antibiotic ointment on each drain site. Gave her a copy of path and genetic testing

## 2012-05-17 ENCOUNTER — Ambulatory Visit (INDEPENDENT_AMBULATORY_CARE_PROVIDER_SITE_OTHER): Payer: Medicare Other | Admitting: Surgery

## 2012-05-17 ENCOUNTER — Encounter (HOSPITAL_COMMUNITY): Payer: Medicare Other | Attending: Oncology | Admitting: Oncology

## 2012-05-17 ENCOUNTER — Telehealth (INDEPENDENT_AMBULATORY_CARE_PROVIDER_SITE_OTHER): Payer: Self-pay

## 2012-05-17 ENCOUNTER — Encounter (INDEPENDENT_AMBULATORY_CARE_PROVIDER_SITE_OTHER): Payer: Self-pay | Admitting: Surgery

## 2012-05-17 VITALS — BP 108/72 | HR 87 | Temp 96.2°F | Ht 60.0 in | Wt 167.4 lb

## 2012-05-17 VITALS — BP 180/80 | HR 86 | Resp 18 | Wt 168.7 lb

## 2012-05-17 DIAGNOSIS — C50419 Malignant neoplasm of upper-outer quadrant of unspecified female breast: Secondary | ICD-10-CM

## 2012-05-17 DIAGNOSIS — E559 Vitamin D deficiency, unspecified: Secondary | ICD-10-CM

## 2012-05-17 DIAGNOSIS — Z853 Personal history of malignant neoplasm of breast: Secondary | ICD-10-CM

## 2012-05-17 DIAGNOSIS — C50911 Malignant neoplasm of unspecified site of right female breast: Secondary | ICD-10-CM

## 2012-05-17 DIAGNOSIS — Z1501 Genetic susceptibility to malignant neoplasm of breast: Secondary | ICD-10-CM

## 2012-05-17 DIAGNOSIS — Z09 Encounter for follow-up examination after completed treatment for conditions other than malignant neoplasm: Secondary | ICD-10-CM

## 2012-05-17 NOTE — Progress Notes (Signed)
#1 right sided breast cancer,GRADE II, 1.2 cm invasive ductal carcinoma, ER +99%, PR +72%, grade 2, LV I positive, Ki-67 marker high at 60%, but 4 sentinel lymph nodes negative for metastatic disease. HER-2/neu nonamplified.  #2 left-sided breast cancer 1996, treated with lumpectomy followed by chemotherapy consisting of CMF for 8 cycles. followed by radiation therapy. She was not given tamoxifen. There was some question of Megace one tablet a day but she is not sure of the duration. This may have been for hot flashes which she remembers were severe. She was eventually placed on Effexor for hot flashes but is now on the Effexor for depression.  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 #4 Two positive gene mutations,Chek2 and Palb1 #5 excess weight #6 history of a popliteal cyst #7 vitamin D deficiency on replacement  Very pleasant 67 year old woman who I she works here in [employer] who was found to have abnormalities on growth breast exams. Eventual workup led to definitive surgery consisting of bilateral mastectomies in 05/08/2012. She had a prior history as mentioned above of left breast cancer.  She now has an invasive ductal carcinoma with the above-mentioned findings. She has a family history of thyroid cancer brother who is deceased and a sister who had lung cancer, nonsmoker who is also deceased. Father died of lymphoma and her mother died of lung cancer. Her mother however was a smoker. She has a brother who survived lymphoma   She is accompanied today by her one daughter. She has 1 other daughter. Her husband is also with her today. Her daughter who is with her has had a leiomyoma that was large resected from her chest by Dr. Dewayne Shorter. This was in 2008.  Jalee is recovering from the surgery well. Her oncology review of systems is noncontributory otherwise. She is not a smoker. She's not a drinker.  BP 180/80  Pulse 86  Resp 18  Wt 168 lb 11.2 oz (76.522  kg)  BMI 32.95 kg/m2  Pleasant woman in no acute distress. She is alert oriented and very bright. She has no obvious head and neck lesions. Throat is clear. Tongue is normal in the midline. Facial symmetry is intact. She is a right-handed woman. She has no lymphadenopathy in cervical, supraclavicular, infraclavicular, axillary or inguinal areas. She has no arm edema or leg edema. Pulses are 1+ and symmetrical. She has tubes draining from both chest walls bilaterally. Scars appear to be healing well. There does not appear to be tumor nodularity anywhere. Heart shows a regular rhythm and rate. Skin shows multiple benign lesions. Lungs are clear to auscultation and percussion. Abdomen is obese but without obvious hepatosplenomegaly. Bowel sounds are normal.  This lady does have a stage I cancer which has several worrisome features namely grade 2, LV I. positivity, and a high Ki-67 marker. An Oncotype is pending but I suspect she should be treated with adjuvant chemotherapy. She was treated with CMF as well as radiation. I would like to avoid Cytoxan or other alkylating agents. I would prefer her to consider carboplatinum and Taxotere for 6 cycles followed by tamoxifen or an AI for 5 years.  She is very amenable to this. It will still be of interest to see her Oncotype.  I would like to stage her CAT scans of the chest abdomen and pelvis and a bone scan. She of course was very worried after discussing her findings with Dr. Jamey Ripa about hematogenously for an from the  positive LV I.  Staging her we'll also gives a chance to look at her pancreas, and other organs since she is genetically abnormal.  We will stage her in early June try to get her treatment started by mid June to the third week in June

## 2012-05-17 NOTE — Telephone Encounter (Signed)
Pt will need a PAC placed.

## 2012-05-17 NOTE — Patient Instructions (Addendum)
[facility] Discharge Instructions  RECOMMENDATIONS MADE BY THE CONSULTANT AND ANY TEST RESULTS WILL BE SENT TO YOUR REFERRING PHYSICIAN.  EXAM FINDINGS BY THE PHYSICIAN TODAY AND SIGNS OR SYMPTOMS TO REPORT TO CLINIC OR PRIMARY PHYSICIAN: Exam and discussion by MD.  Oncotyping is pending - that will be looking at your genes to determine your risk for recurrence.  Your prognostic factors (ER, PR receptors and HER 2) are good but your tumor grade is a little worrisome.  Goal would be to get scans for staging, port a cath placed, then chemotherapy administration using Carboplatin and Taxotere then hormonal therapy for 5 years.  Your chemotherapy would be every 21 days for 6 cycles and would also give your neulasta following chemotherapy.  MEDICATIONS PRESCRIBED:  none  INSTRUCTIONS GIVEN AND DISCUSSED: Will also get an appointment with Rosana Berger our nurse navigator for teaching about your chemotherapy.  SPECIAL INSTRUCTIONS/FOLLOW-UP: CT of chest, abdomen and pelvis and Bone Scan, then chemo teaching.  Will schedule follow-up after your first cycle of chemotherapy.  Thank you for choosing [facility] to provide your oncology and hematology care.  To afford each patient quality time with our providers, please arrive at least 15 minutes before your scheduled appointment time.  With your help, our goal is to use those 15 minutes to complete the necessary work-up to ensure our physicians have the information they need to help with your evaluation and healthcare recommendations.    Effective January 1st, 2014, we ask that you re-schedule your appointment with our physicians should you arrive 10 or more minutes late for your appointment.  We strive to give you quality time with our providers, and arriving late affects you and other patients whose appointments are after yours.    Again, thank you for choosing [facility].  Our hope is that these requests  will decrease the amount of time that you wait before being seen by our physicians.       _____________________________________________________________  Should you have questions after your visit to [facility], please contact our office at [phone] between the hours of 8:30 a.m. and 5:00 p.m.  Voicemails left after 4:30 p.m. will not be returned until the following business day.  For prescription refill requests, have your pharmacy contact our office with your prescription refill request.     Carboplatin injection What is this medicine? CARBOPLATIN (KAR boe pla tin) is a chemotherapy drug. It targets fast dividing cells, like cancer cells, and causes these cells to die. This medicine is used to treat ovarian cancer and many other cancers. This medicine may be used for other purposes; ask your health care provider or pharmacist if you have questions. What should I tell my health care provider before I take this medicine? They need to know if you have any of these conditions: -blood disorders -hearing problems -kidney disease -recent or ongoing radiation therapy -an unusual or allergic reaction to carboplatin, cisplatin, other chemotherapy, other medicines, foods, dyes, or preservatives -pregnant or trying to get pregnant -breast-feeding How should I use this medicine? This drug is usually given as an infusion into a vein. It is administered in a hospital or clinic by a specially trained health care professional. Talk to your pediatrician regarding the use of this medicine in children. Special care may be needed. Overdosage: If you think you have taken too much of this medicine contact a poison control center or emergency room at once. NOTE:  This medicine is only for you. Do not share this medicine with others. What if I miss a dose? It is important not to miss a dose. Call your doctor or health care professional if you are unable to keep an appointment. What may interact  with this medicine? -medicines for seizures -medicines to increase blood counts like filgrastim, pegfilgrastim, sargramostim -some antibiotics like amikacin, gentamicin, neomycin, streptomycin, tobramycin -vaccines Talk to your doctor or health care professional before taking any of these medicines: -acetaminophen -aspirin -ibuprofen -ketoprofen -naproxen This list may not describe all possible interactions. Give your health care provider a list of all the medicines, herbs, non-prescription drugs, or dietary supplements you use. Also tell them if you smoke, drink alcohol, or use illegal drugs. Some items may interact with your medicine. What should I watch for while using this medicine? Your condition will be monitored carefully while you are receiving this medicine. You will need important blood work done while you are taking this medicine. This drug may make you feel generally unwell. This is not uncommon, as chemotherapy can affect healthy cells as well as cancer cells. Report any side effects. Continue your course of treatment even though you feel ill unless your doctor tells you to stop. In some cases, you may be given additional medicines to help with side effects. Follow all directions for their use. Call your doctor or health care professional for advice if you get a fever, chills or sore throat, or other symptoms of a cold or flu. Do not treat yourself. This drug decreases your body's ability to fight infections. Try to avoid being around people who are sick. This medicine may increase your risk to bruise or bleed. Call your doctor or health care professional if you notice any unusual bleeding. Be careful brushing and flossing your teeth or using a toothpick because you may get an infection or bleed more easily. If you have any dental work done, tell your dentist you are receiving this medicine. Avoid taking products that contain aspirin, acetaminophen, ibuprofen, naproxen, or ketoprofen  unless instructed by your doctor. These medicines may hide a fever. Do not become pregnant while taking this medicine. Women should inform their doctor if they wish to become pregnant or think they might be pregnant. There is a potential for serious side effects to an unborn child. Talk to your health care professional or pharmacist for more information. Do not breast-feed an infant while taking this medicine. What side effects may I notice from receiving this medicine? Side effects that you should report to your doctor or health care professional as soon as possible: -allergic reactions like skin rash, itching or hives, swelling of the face, lips, or tongue -signs of infection - fever or chills, cough, sore throat, pain or difficulty passing urine -signs of decreased platelets or bleeding - bruising, pinpoint red spots on the skin, black, tarry stools, nosebleeds -signs of decreased red blood cells - unusually weak or tired, fainting spells, lightheadedness -breathing problems -changes in hearing -changes in vision -chest pain -high blood pressure -low blood counts - This drug may decrease the number of white blood cells, red blood cells and platelets. You may be at increased risk for infections and bleeding. -nausea and vomiting -pain, swelling, redness or irritation at the injection site -pain, tingling, numbness in the hands or feet -problems with balance, talking, walking -trouble passing urine or change in the amount of urine Side effects that usually do not require medical attention (report to your doctor or  health care professional if they continue or are bothersome): -hair loss -loss of appetite -metallic taste in the mouth or changes in taste This list may not describe all possible side effects. Call your doctor for medical advice about side effects. You may report side effects to FDA at 1-800-FDA-1088. Where should I keep my medicine? This drug is given in a hospital or clinic and  will not be stored at home. NOTE: This sheet is a summary. It may not cover all possible information. If you have questions about this medicine, talk to your doctor, pharmacist, or health care provider.  2012, Elsevier/Gold Standard. (03/26/2007 2:38:05 PM)Docetaxel injection What is this medicine? DOCETAXEL (doe se TAX el) is a chemotherapy drug. It targets fast dividing cells, like cancer cells, and causes these cells to die. This medicine is used to treat many types of cancers like breast cancer, certain stomach cancers, head and neck cancer, lung cancer, and prostate cancer. This medicine may be used for other purposes; ask your health care provider or pharmacist if you have questions. What should I tell my health care provider before I take this medicine? They need to know if you have any of these conditions: -infection (especially a virus infection such as chickenpox, cold sores, or herpes) -liver disease -low blood counts, like low white cell, platelet, or red cell counts -an unusual or allergic reaction to docetaxel, polysorbate 80, other chemotherapy agents, other medicines, foods, dyes, or preservatives -pregnant or trying to get pregnant -breast-feeding How should I use this medicine? This drug is given as an infusion into a vein. It is administered in a hospital or clinic by a specially trained health care professional. Talk to your pediatrician regarding the use of this medicine in children. Special care may be needed. Overdosage: If you think you have taken too much of this medicine contact a poison control center or emergency room at once. NOTE: This medicine is only for you. Do not share this medicine with others. What if I miss a dose? It is important not to miss your dose. Call your doctor or health care professional if you are unable to keep an appointment. What may interact with this medicine? -cyclosporine -erythromycin -ketoconazole -medicines to increase blood counts  like filgrastim, pegfilgrastim, sargramostim -vaccines Talk to your doctor or health care professional before taking any of these medicines: -acetaminophen -aspirin -ibuprofen -ketoprofen -naproxen This list may not describe all possible interactions. Give your health care provider a list of all the medicines, herbs, non-prescription drugs, or dietary supplements you use. Also tell them if you smoke, drink alcohol, or use illegal drugs. Some items may interact with your medicine. What should I watch for while using this medicine? Your condition will be monitored carefully while you are receiving this medicine. You will need important blood work done while you are taking this medicine. This drug may make you feel generally unwell. This is not uncommon, as chemotherapy can affect healthy cells as well as cancer cells. Report any side effects. Continue your course of treatment even though you feel ill unless your doctor tells you to stop. In some cases, you may be given additional medicines to help with side effects. Follow all directions for their use. Call your doctor or health care professional for advice if you get a fever, chills or sore throat, or other symptoms of a cold or flu. Do not treat yourself. This drug decreases your body's ability to fight infections. Try to avoid being around people who are sick.  This medicine may increase your risk to bruise or bleed. Call your doctor or health care professional if you notice any unusual bleeding. Be careful brushing and flossing your teeth or using a toothpick because you may get an infection or bleed more easily. If you have any dental work done, tell your dentist you are receiving this medicine. Avoid taking products that contain aspirin, acetaminophen, ibuprofen, naproxen, or ketoprofen unless instructed by your doctor. These medicines may hide a fever. Do not become pregnant while taking this medicine. Women should inform their doctor if they  wish to become pregnant or think they might be pregnant. There is a potential for serious side effects to an unborn child. Talk to your health care professional or pharmacist for more information. Do not breast-feed an infant while taking this medicine. What side effects may I notice from receiving this medicine? Side effects that you should report to your doctor or health care professional as soon as possible: -allergic reactions like skin rash, itching or hives, swelling of the face, lips, or tongue -low blood counts - This drug may decrease the number of white blood cells, red blood cells and platelets. You may be at increased risk for infections and bleeding. -signs of infection - fever or chills, cough, sore throat, pain or difficulty passing urine -signs of decreased platelets or bleeding - bruising, pinpoint red spots on the skin, black, tarry stools, nosebleeds -signs of decreased red blood cells - unusually weak or tired, fainting spells, lightheadedness -breathing problems -fast or irregular heartbeat -low blood pressure -mouth sores -nausea and vomiting -pain, swelling, redness or irritation at the injection site -pain, tingling, numbness in the hands or feet -swelling of the ankle, feet, hands -weight gain Side effects that usually do not require medical attention (report to your prescriber or health care professional if they continue or are bothersome): -bone pain -complete hair loss including hair on your head, underarms, pubic hair, eyebrows, and eyelashes -diarrhea -excessive tearing -changes in the color of fingernails -loosening of the fingernails -nausea -muscle pain -red flush to skin -sweating -weak or tired This list may not describe all possible side effects. Call your doctor for medical advice about side effects. You may report side effects to FDA at 1-800-FDA-1088. Where should I keep my medicine? This drug is given in a hospital or clinic and will not be  stored at home. NOTE: This sheet is a summary. It may not cover all possible information. If you have questions about this medicine, talk to your doctor, pharmacist, or health care provider.  2013, Elsevier/Gold Standard. (12/02/2007 11:52:10 AM)

## 2012-05-17 NOTE — Patient Instructions (Signed)
Synetta Fail back next week to see if we can remove the other drains and some more of the wound staples

## 2012-05-17 NOTE — Progress Notes (Signed)
The patient comes back for followup after bilateral mastectomies. She feels that she's doing okay.  On exam the wounds are healing okay. There is a small area of skin necrosis on the left side I think this will heal without problems. The drainage is slowing down and I removed the medial one from the right side.  She'll come back next week and we will hopefully get the remaining drains out and perhaps more of the staples. Alternate staples were removed today.

## 2012-05-20 ENCOUNTER — Encounter: Payer: Medicare Other | Admitting: Genetic Counselor

## 2012-05-20 ENCOUNTER — Other Ambulatory Visit: Payer: Medicare Other | Admitting: Lab

## 2012-05-21 ENCOUNTER — Ambulatory Visit (INDEPENDENT_AMBULATORY_CARE_PROVIDER_SITE_OTHER): Payer: Medicare Other | Admitting: General Surgery

## 2012-05-21 ENCOUNTER — Encounter (INDEPENDENT_AMBULATORY_CARE_PROVIDER_SITE_OTHER): Payer: Self-pay | Admitting: General Surgery

## 2012-05-21 VITALS — BP 104/70 | HR 72 | Resp 14 | Ht 60.0 in | Wt 167.6 lb

## 2012-05-21 DIAGNOSIS — Z9889 Other specified postprocedural states: Secondary | ICD-10-CM

## 2012-05-21 NOTE — Progress Notes (Signed)
She returns to have her drains removed and staples taken out following her bilateral mastectomies. She feels a little tightness around the scars in her chest.  Output from the 2 remaining drains is less than 30 cc per day.  On exam, both chest wall incisions are healing nicely. Some staples are in. All drains were removed.  Remaining staples were removed and some benzoin Steri-Strips were applied.  Assessment: Both remaining drains have been removed his staples have been removed from the chest wall incisions.  Plan: Activities as tolerated. Return visit with Dr. Jamey Ripa in one month.

## 2012-05-21 NOTE — Patient Instructions (Signed)
Activities as tolerated. 

## 2012-05-22 ENCOUNTER — Encounter (INDEPENDENT_AMBULATORY_CARE_PROVIDER_SITE_OTHER): Payer: Self-pay | Admitting: Surgery

## 2012-05-22 ENCOUNTER — Ambulatory Visit (INDEPENDENT_AMBULATORY_CARE_PROVIDER_SITE_OTHER): Payer: Medicare Other | Admitting: Surgery

## 2012-05-22 ENCOUNTER — Telehealth (INDEPENDENT_AMBULATORY_CARE_PROVIDER_SITE_OTHER): Payer: Self-pay | Admitting: *Deleted

## 2012-05-22 VITALS — BP 122/74 | HR 66 | Temp 97.9°F | Resp 16 | Ht 60.0 in | Wt 168.8 lb

## 2012-05-22 DIAGNOSIS — Z9889 Other specified postprocedural states: Secondary | ICD-10-CM

## 2012-05-22 MED ORDER — DOXYCYCLINE HYCLATE 50 MG PO CAPS: 100.0000 mg | ORAL_CAPSULE | Freq: Two times a day (BID) | ORAL | Status: DC

## 2012-05-22 NOTE — Patient Instructions (Signed)
Place maxi pad over drain site and change as needed.

## 2012-05-22 NOTE — Progress Notes (Signed)
Patient returns after bilateral mastectomies by Dr. Jamey Ripa 9 days ago. Dr. Abbey Chatters  removed drains from  left side yesterday. She has had significant drainage from the drain sites  On the left and wished to be checked.  Exam: Mild erythema of the skin on the left chest wall. Serous-appearing drainage from the drain site. Small amount of fibrinous exudate noted  At site. No palpable seroma. Right chest wall incision clean dry and intact. No seroma.  Impression: Questionable mild cellulitis left chest skin after bilateral mastectomy with history of left chest wall radiation 18 years ago and increased drainage from her drain site  Plan: I think most of her issues are secondary to radiation and will place her on doxycycline 100 mg by mouth twice a day since she has a mild amount of redness. I've asked her place of maxi pad over the site for drainage and is diffusely serous. Return in 7-10 days to see Dr. Jamey Ripa unless symptoms worsen.

## 2012-05-22 NOTE — Telephone Encounter (Signed)
Patient called to state she is still having some drainage from where her drain was pulled yesterday.  Encouraged patient to watch the drainage and call back tomorrow morning with an update.  This RN truly believes that the drainage will start to tamper off.  Patient describes no signs of infection; no redness or fevers per patient.

## 2012-05-22 NOTE — Telephone Encounter (Signed)
Patient called back to state that the drainage is actually increasing in amount.  Patient states it not looks like a small amount of pus coming out with the drainage.  Patient scheduled for urgent office this afternoon.

## 2012-05-23 ENCOUNTER — Telehealth (INDEPENDENT_AMBULATORY_CARE_PROVIDER_SITE_OTHER): Payer: Self-pay | Admitting: General Surgery

## 2012-05-23 NOTE — Telephone Encounter (Signed)
Message copied by Liliana Cline on Thu May 23, 2012 11:31 AM ------      Message from: Currie Paris      Created: Thu May 23, 2012 10:55 AM       No - wanted to see her to be sure no seromas or wound issues before scheduling it.      ----- Message -----         From: Liliana Cline, CMA         Sent: 05/23/2012  10:36 AM           To: Currie Paris, MD            Did you send orders for [facility] placement on patient?            Delmont Prosch       ------

## 2012-05-23 NOTE — Telephone Encounter (Signed)
We will see patient on 05/31/2012 to evaluate wounds and schedule PAC surgery at that time if wounds look well healed.

## 2012-05-28 ENCOUNTER — Telehealth (INDEPENDENT_AMBULATORY_CARE_PROVIDER_SITE_OTHER): Payer: Self-pay | Admitting: *Deleted

## 2012-05-28 NOTE — Telephone Encounter (Signed)
Patient called to ask about going into a hot tub while on vacation since she is still on an antibiotic.  Explained to patient that the antibiotic will make her more sensitive to the heat of a hot tub and could cause her to become light headed.  Also reminded her to be careful in the sun since she will be more sensitive.  Patient also stated she is having some mild swelling in her ankles and feet, patient asking about taking something OTC.  This RN advised her not to try OTC medications since she has no history of this and to just stay away from salt, sodas, and other foods that cause retention.  Patient also advised to put her feet up to help with the swelling.  Patient states understanding and agreeable.

## 2012-05-30 ENCOUNTER — Ambulatory Visit: Payer: Medicare Other | Admitting: Gastroenterology

## 2012-05-30 ENCOUNTER — Encounter: Payer: Self-pay | Admitting: *Deleted

## 2012-05-31 ENCOUNTER — Encounter (INDEPENDENT_AMBULATORY_CARE_PROVIDER_SITE_OTHER): Payer: Self-pay | Admitting: Surgery

## 2012-05-31 ENCOUNTER — Encounter (INDEPENDENT_AMBULATORY_CARE_PROVIDER_SITE_OTHER): Payer: Self-pay

## 2012-05-31 ENCOUNTER — Ambulatory Visit (INDEPENDENT_AMBULATORY_CARE_PROVIDER_SITE_OTHER): Payer: Medicare Other | Admitting: Surgery

## 2012-05-31 ENCOUNTER — Telehealth (HOSPITAL_COMMUNITY): Payer: Self-pay

## 2012-05-31 VITALS — BP 110/78 | HR 72 | Temp 97.9°F | Resp 18 | Ht 60.0 in | Wt 171.0 lb

## 2012-05-31 DIAGNOSIS — Z09 Encounter for follow-up examination after completed treatment for conditions other than malignant neoplasm: Secondary | ICD-10-CM

## 2012-05-31 MED ORDER — DOXYCYCLINE HYCLATE 50 MG PO CAPS: 100.0000 mg | ORAL_CAPSULE | Freq: Two times a day (BID) | ORAL | Status: DC

## 2012-05-31 NOTE — Patient Instructions (Signed)
Continue the antibiotic and see me again next week

## 2012-05-31 NOTE — Progress Notes (Signed)
Patient comes in for recheck status post bilateral mastectomies. At her last visit to serve as doxycycline because her some concern about developing infection in the left mastectomy site. However the changes were thought more likely to be due to radiation. She's had no fever. She continues to have some serous drainage from the old drain site.  Exam: She has a seroma the right side. I aspirate that and got 60 cc of clear fluid.on the left there is a smaller stroma aspirated and received 20 cc  Impression: Bilateral postmastectomy seromatous  Plan: I would continue her on antibiotics because of concern about the potential for infection developing in this fluid. I will see her in a week for another aspiration. Hopefully this will only need to be done one or 2 more times. Will need

## 2012-05-31 NOTE — Telephone Encounter (Signed)
Call from patient.  Saw Dr. Jamey Ripa today and was told she needed to stay on antibiotics for infection left mastectomy site and that it would be 2 - 3 weeks before he could place a port a cath.

## 2012-06-03 ENCOUNTER — Ambulatory Visit (INDEPENDENT_AMBULATORY_CARE_PROVIDER_SITE_OTHER): Payer: Medicare Other | Admitting: Gastroenterology

## 2012-06-03 ENCOUNTER — Encounter: Payer: Self-pay | Admitting: Gastroenterology

## 2012-06-03 ENCOUNTER — Encounter (HOSPITAL_COMMUNITY): Payer: Medicare Other | Attending: Oncology | Admitting: Oncology

## 2012-06-03 VITALS — BP 132/63 | HR 88 | Temp 98.0°F | Ht 60.0 in | Wt 168.8 lb

## 2012-06-03 DIAGNOSIS — C50919 Malignant neoplasm of unspecified site of unspecified female breast: Secondary | ICD-10-CM | POA: Insufficient documentation

## 2012-06-03 DIAGNOSIS — D369 Benign neoplasm, unspecified site: Secondary | ICD-10-CM | POA: Insufficient documentation

## 2012-06-03 DIAGNOSIS — Z170 Estrogen receptor positive status [ER+]: Secondary | ICD-10-CM

## 2012-06-03 DIAGNOSIS — C50419 Malignant neoplasm of upper-outer quadrant of unspecified female breast: Secondary | ICD-10-CM

## 2012-06-03 DIAGNOSIS — C50911 Malignant neoplasm of unspecified site of right female breast: Secondary | ICD-10-CM

## 2012-06-03 DIAGNOSIS — K219 Gastro-esophageal reflux disease without esophagitis: Secondary | ICD-10-CM

## 2012-06-03 MED ORDER — EXEMESTANE 25 MG PO TABS: 25.0000 mg | ORAL_TABLET | Freq: Every day | ORAL | Status: DC

## 2012-06-03 MED ORDER — PEG 3350-KCL-NA BICARB-NACL 420 G PO SOLR: 4000.0000 mL | ORAL | Status: DC

## 2012-06-03 MED ORDER — OMEPRAZOLE 20 MG PO CPDR: 20.0000 mg | DELAYED_RELEASE_CAPSULE | Freq: Every day | ORAL | Status: DC

## 2012-06-03 NOTE — Patient Instructions (Addendum)
We have scheduled you for a colonoscopy and upper endoscopy with Dr. Darrick Penna in the near future.  For reflux, start taking Prilosec each morning, 30 minutes before breakfast. I have sent this to your pharmacy.

## 2012-06-03 NOTE — Progress Notes (Signed)
Referring Provider: Bing Plume, MD Primary Care Physician:  Fredirick Maudlin, MD Primary Gastroenterologist:  Dr. Darrick Penna   Chief Complaint  Patient presents with  . Colonoscopy    HPI:   67 year old pleasant female, works at Psychologist, sport and exercise at [employer], presents today at the request of Dr. Mariel Sleet for surveillance colonoscopy and upper endoscopy. Recently had bilateral mastectomy May 2014. Cancer is prevalent in her family to include thyroid, lung, leukemia, lymphoma. No FH of colon cancer. Genetic testing revealed elevated risk for pancreatic and colorectal cancer. No abdominal pain. Hx of IBS. Intermittent constipation noted. Rare rectal bleeding, specifically with straining. Has a history of anal fissures and "terrible" hemorrhoids. +reflux, only takes Tums every night. No dysphagia. Has to clear her throat a lot. Occasional nausea in the morning, specifically on waking, better after eating. No weight loss or lack of appetite.   Last colonoscopy by Dr. Madilyn Fireman in 2008 with adenomatous polyp.   Past Medical History  Diagnosis Date  . HTN (hypertension)   . Anxiety and depression   . GERD (gastroesophageal reflux disease)     tums  . IBS (irritable bowel syndrome)   . Hemorrhoid   . History of blood transfusion     post child birth  . PPD positive, treated 1999  . PONV (postoperative nausea and vomiting)   . Exertional shortness of breath   . Sleep apnea     "gone after I had OR" (05/08/2012)  . Diabetes mellitus 1999    "dx; never on meds" (05/08/2012)  . H/O hiatal hernia   . Arthritis     "of the spine" (05/08/2012)  . Anxiety   . Depression   . Breast cancer 1995    "left" (05/08/2012)  . Breast cancer, right breast, IDC, Multifocal 03/12/2012    Multifocal, 10 and 8 left breast     Past Surgical History  Procedure Laterality Date  . Tubal ligation  1976  . Mastectomy complete / simple w/ sentinel node biopsy Right 05/08/2012  . Mastectomy complete / simple Left  05/08/2012  . Uvulopalatopharyngoplasty, tonsillectomy and septoplasty  ~ 2008  . Cholecystectomy  1992  . Dilation and curettage of uterus  1968  . Tonsillectomy and adenoidectomy  ~ 2008  . Breast lumpectomy Left 1995  . Breast biopsy Left 1995  . Breast biopsy Bilateral 03/2012    "one on the left; 2 on the right" (05/08/2012)  . Simple mastectomy with axillary sentinel node biopsy Right 05/08/2012    Procedure: RIGHT TOTAL MASTECTOMY WITH AXILLARY SENTINEL NODE BIOPSY;  Surgeon: Currie Paris, MD;  Location: [facility] OR;  Service: General;  Laterality: Right;  . Simple mastectomy with axillary sentinel node biopsy Left 05/08/2012    Procedure: LEFT TOTAL MASTECTOMY;  Surgeon: Currie Paris, MD;  Location: [facility] OR;  Service: General;  Laterality: Left;  . Colonoscopy  12/06/93    Dr. Hayes:single diminutive polyp of the descending colon/extrernal hemorrhoids, benign path  . Colonoscopy  11/30/98    Dr Hayes:small rectal polyp/internal hemorrhoids, benign path  . Colonoscopy  11/26/2006    Dr. Hayes:sigmoid polyp/small interal hemorrhoids, adenomatous  . Esophagogastroduodenoscopy  1994    Dr. Matthias Hughs: small hiatal hernia and Schatzki's ring widely patent, CLO test negative    Current Outpatient Prescriptions  Medication Sig Dispense Refill  . Calcium Carbonate-Vitamin D (CALCIUM 600+D3 PO) Take 1 tablet by mouth 2 (two) times daily.      . cholecalciferol (VITAMIN D) 1000 UNITS tablet Take 2,000  Units by mouth daily.      Marland Kitchen doxycycline (VIBRAMYCIN) 50 MG capsule Take 2 capsules (100 mg total) by mouth 2 (two) times daily.  40 capsule  0  . lisinopril (PRINIVIL,ZESTRIL) 20 MG tablet Take 20 mg by mouth daily.      . metoprolol succinate (TOPROL-XL) 25 MG 24 hr tablet Take 25 mg by mouth daily.      . Multiple Vitamin (MULTIVITAMIN WITH MINERALS) TABS Take 1 tablet by mouth daily.      Marland Kitchen venlafaxine (EFFEXOR) 25 MG tablet Take 50 mg by mouth 2 (two) times daily.      Marland Kitchen exemestane (AROMASIN)  25 MG tablet Take 1 tablet (25 mg total) by mouth daily after breakfast.  30 tablet  12  . omeprazole (PRILOSEC) 20 MG capsule Take 1 capsule (20 mg total) by mouth daily.  30 capsule  3  . polyethylene glycol-electrolytes (TRILYTE) 420 G solution Take 4,000 mLs by mouth as directed.  4000 mL  0   No current facility-administered medications for this visit.    Allergies as of 06/03/2012 - Review Complete 06/03/2012  Allergen Reaction Noted  . Penicillins Rash 08/18/2010    Family History  Problem Relation Age of Onset  . Arthritis    . Cancer    . Diabetes    . Lung cancer Mother 41    smoker  . Lymphoma Father   . Leukemia Father   . Lung cancer Sister 44  . Thyroid cancer Brother 65  . Lymphoma Brother 60  . Breast cancer Maternal Aunt     mother's paternal half sister  . Cancer Maternal Uncle     unknown type; mother's paternal half brother  . Lung cancer Maternal Grandmother   . Cancer Maternal Aunt     unknown cancer; mother's maternal half sister  . Colon cancer Neg Hx     History   Social History  . Marital Status: Married    Spouse Name: N/A    Number of Children: N/A  . Years of Education: 12th grade   Occupational History  . 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 Smoking status: Never Smoker   . Smokeless tobacco: Never Used  . Alcohol Use: Yes     Comment: 05/08/2012 "margarita or glass of wine once or twice/yr"  . Drug Use: No  . Sexually Active: No   Other Topics Concern  . Not on file   Social History Narrative  . No narrative on file    Review of Systems: Gen: SEE HPI CV: Denies chest pain, heart palpitations, syncope, peripheral edema. Resp: Denies shortness of breath with rest, cough, wheezing GI: SEE HPI GU : Denies urinary burning, urinary frequency, urinary incontinence.  MS: legs ache at night Derm: +dry skin Psych: Denies depression, anxiety, confusion or memory loss  Heme: Denies bruising, bleeding, and  enlarged lymph nodes.  Physical Exam: BP 132/63  Pulse 88  Temp(Src) 98 F (36.7 C) (Oral)  Ht 5' (1.524 m)  Wt 168 lb 12.8 oz (76.567 kg)  BMI 32.97 kg/m2 General:   Alert and oriented. Well-developed, well-nourished, pleasant and cooperative. Head:  Normocephalic and atraumatic. Eyes:  Conjunctiva pink, sclera clear, no icterus.   Ears:  Normal auditory acuity. Nose:  No deformity, discharge,  or lesions. Mouth:  No deformity or lesions, mucosa pink and moist.  Neck:  Supple, without mass or thyromegaly. Lungs:  Clear to auscultation bilaterally, without wheezing, rales,  or rhonchi.  Heart:  S1, S2 present without murmurs noted.  Abdomen:  +BS, soft, non-tender and non-distended. Without mass or HSM. No rebound or guarding. No hernias noted. Rectal:  Deferred  Msk:  Symmetrical without gross deformities. Normal posture. Extremities:  Without clubbing or edema. Neurologic:  Alert and  oriented x4;  grossly normal neurologically. Skin:  Intact, warm and dry without significant lesions or rashes Cervical Nodes:  No significant cervical adenopathy. Psych:  Alert and cooperative. Normal mood and affect.

## 2012-06-03 NOTE — Progress Notes (Signed)
#  1 stage I, grade 2, breast cancer 1.2 cm, ER +99%, PR +72%, with LV I but 4 negative sentinel lymph nodes. HER-2/neu was nonamplified. Ki-67 marker was 60%.  Her Oncotype DX score has returned at 14 giving her recurrence rate of 9% at 5 years of hormonal therapy. With that in mind she is certainly not the best candidate for systemic chemotherapy.  She therefore would like to not be treated with chemotherapy and just take an aromatase inhibitor at this juncture. At the end of 5 years we should certainly have much more information about 10 years versus 5 years but I think she would be a candidate for switching to tamoxifen at the completion of 5 years of an AI.  She of course also has 2 other gene mutations and we will continue to followup with her staging scans either way. With her history of these mutations a thyroid scan will also be performed.  She had numerous questions which I think we have answered. We've gone over the side effects of therapy she wants to pursue the aromatase inhibitor/hormonal therapy only at this juncture.  We'll start with Aromasin since I think she will tolerate that better but we can switch her if need be to either letrozole, Arimidex, or tamoxifen if need be.

## 2012-06-03 NOTE — Patient Instructions (Addendum)
[facility] Discharge Instructions  RECOMMENDATIONS MADE BY THE CONSULTANT AND ANY TEST RESULTS WILL BE SENT TO YOUR REFERRING PHYSICIAN.  EXAM FINDINGS BY THE PHYSICIAN TODAY AND SIGNS OR SYMPTOMS TO REPORT TO CLINIC OR PRIMARY PHYSICIAN: Discussion of Gene assay by  MD.  Bonita Quin have low recurrence score and with chemotherapy you would only get additional benefit by 1 - 1.5 %.  Recommendation are to take an aromatase inhibitor for 5 years.  MEDICATIONS PRESCRIBED:  Aromasin 25 mg take 1 daily - Side effects discussed by MD.  INSTRUCTIONS GIVEN AND DISCUSSED: Report any problems with the aromasin, any new lumps, bone pain or shortness of breath.  SPECIAL INSTRUCTIONS/FOLLOW-UP: Keep your appointments for the scans that are scheduled, we will get you scheduled for the Thyroid scan and will let you know the date and time of the scan.  We will see you back in 6 months.  Thank you for choosing [facility] to provide your oncology and hematology care.  To afford each patient quality time with our providers, please arrive at least 15 minutes before your scheduled appointment time.  With your help, our goal is to use those 15 minutes to complete the necessary work-up to ensure our physicians have the information they need to help with your evaluation and healthcare recommendations.    Effective January 1st, 2014, we ask that you re-schedule your appointment with our physicians should you arrive 10 or more minutes late for your appointment.  We strive to give you quality time with our providers, and arriving late affects you and other patients whose appointments are after yours.    Again, thank you for choosing [facility].  Our hope is that these requests will decrease the amount of time that you wait before being seen by our physicians.       _____________________________________________________________  Should you have questions after your visit to [facility], please contact our office at [phone] between the hours of 8:30 a.m. and 5:00 p.m.  Voicemails left after 4:30 p.m. will not be returned until the following business day.  For prescription refill requests, have your pharmacy contact our office with your prescription refill request.

## 2012-06-04 ENCOUNTER — Other Ambulatory Visit (HOSPITAL_COMMUNITY): Payer: Self-pay

## 2012-06-04 ENCOUNTER — Ambulatory Visit (INDEPENDENT_AMBULATORY_CARE_PROVIDER_SITE_OTHER): Payer: Medicare Other | Admitting: Surgery

## 2012-06-04 ENCOUNTER — Encounter (INDEPENDENT_AMBULATORY_CARE_PROVIDER_SITE_OTHER): Payer: Self-pay | Admitting: Surgery

## 2012-06-04 VITALS — BP 142/74 | HR 78 | Temp 97.5°F | Resp 16 | Ht 60.0 in | Wt 167.6 lb

## 2012-06-04 DIAGNOSIS — Z09 Encounter for follow-up examination after completed treatment for conditions other than malignant neoplasm: Secondary | ICD-10-CM

## 2012-06-04 NOTE — Progress Notes (Signed)
Patient comes in for recheck status post bilateral mastectomies. She continues to have some serous drainage from the medial and of the incision  Exam: She has a seroma the right side. I aspirated it and got 20 cc of clear fluid.on the left there is a smaller seroma aspirated and received 20 cc  Impression: Bilateral postmastectomy seromatous  Plan:she will come back next week for a recheck and possible re\re aspiration

## 2012-06-04 NOTE — Patient Instructions (Signed)
See me again in a week

## 2012-06-05 ENCOUNTER — Encounter (HOSPITAL_COMMUNITY): Payer: Self-pay | Admitting: *Deleted

## 2012-06-05 ENCOUNTER — Encounter: Payer: Self-pay | Admitting: Gastroenterology

## 2012-06-05 NOTE — Progress Notes (Signed)
Cc PCP 

## 2012-06-05 NOTE — Assessment & Plan Note (Signed)
67 year old pleasant female with history of adenomatous polyps, last surveillance in 2008 by Dr. Madilyn Fireman, presents at the request of Dr. Mariel Sleet for updated lower GI evaluation. She has recently undergone bilateral mastectomy secondary to recurrent breast cancer. Genetic testing revealed an elevated risk of pancreatic and colorectal cancer. She notes rare, low-volume hematochezia in the presence of constipation. Likely benign anorectal source.   Proceed with colonoscopy with Dr. Darrick Penna in the near future. The risks, benefits, and alternatives have been discussed in detail with the patient. They state understanding and desire to proceed.  EGD at time of TCS: see GERD

## 2012-06-05 NOTE — Progress Notes (Signed)
Pt will not be taking chemotherapy d/t oncotype scoring and will only be taking an oral AI. Disregard previous chemo instructions printout.

## 2012-06-05 NOTE — Assessment & Plan Note (Signed)
EGD requested by Oncology due to recent diagnosis of breast cancer. She notes chronic GERD, with occasional nausea in the morning, clearing of her throat but no dysphagia. She is not on a PPI and takes Tums every day. Will start Prilosec daily. May have underlying mild gastroparesis as culprit of vague nausea.   Proceed with upper endoscopy at time of TCS with Dr. Darrick Penna. The risks, benefits, and alternatives have been discussed in detail with patient. They have stated understanding and desire to proceed.

## 2012-06-06 ENCOUNTER — Other Ambulatory Visit (HOSPITAL_COMMUNITY): Payer: Medicare Other

## 2012-06-07 ENCOUNTER — Inpatient Hospital Stay (HOSPITAL_COMMUNITY): Payer: Medicare Other

## 2012-06-07 ENCOUNTER — Encounter (HOSPITAL_COMMUNITY)
Admission: RE | Admit: 2012-06-07 | Discharge: 2012-06-07 | Disposition: A | Discharge status: Home / Self Care | Payer: Medicare Other | Source: Ambulatory Visit | Attending: Oncology | Admitting: Oncology

## 2012-06-07 ENCOUNTER — Encounter (HOSPITAL_COMMUNITY): Payer: Self-pay | Admitting: Pharmacy Technician

## 2012-06-07 ENCOUNTER — Encounter (HOSPITAL_BASED_OUTPATIENT_CLINIC_OR_DEPARTMENT_OTHER): Payer: Medicare Other

## 2012-06-07 ENCOUNTER — Encounter (HOSPITAL_COMMUNITY): Payer: Self-pay

## 2012-06-07 DIAGNOSIS — C50911 Malignant neoplasm of unspecified site of right female breast: Secondary | ICD-10-CM

## 2012-06-07 DIAGNOSIS — C50419 Malignant neoplasm of upper-outer quadrant of unspecified female breast: Secondary | ICD-10-CM

## 2012-06-07 DIAGNOSIS — C50919 Malignant neoplasm of unspecified site of unspecified female breast: Secondary | ICD-10-CM | POA: Insufficient documentation

## 2012-06-07 LAB — CBC WITH DIFFERENTIAL/PLATELET
Basophils Absolute: 0 10*3/uL (ref 0.0–0.1)
HCT: 35.8 % — ABNORMAL LOW (ref 36.0–46.0)
Hemoglobin: 11.8 g/dL — ABNORMAL LOW (ref 12.0–15.0)
Lymphocytes Relative: 37 % (ref 12–46)
Monocytes Absolute: 0.6 10*3/uL (ref 0.1–1.0)
Monocytes Relative: 6 % (ref 3–12)
Neutro Abs: 5.1 10*3/uL (ref 1.7–7.7)
RDW: 12.6 % (ref 11.5–15.5)
WBC: 9.2 10*3/uL (ref 4.0–10.5)

## 2012-06-07 LAB — COMPREHENSIVE METABOLIC PANEL
AST: 17 U/L (ref 0–37)
Alkaline Phosphatase: 58 U/L (ref 39–117)
CO2: 24 mEq/L (ref 19–32)
Chloride: 100 mEq/L (ref 96–112)
Creatinine, Ser: 1.07 mg/dL (ref 0.50–1.10)
GFR calc non Af Amer: 53 mL/min — ABNORMAL LOW (ref >90)
Potassium: 4.3 mEq/L (ref 3.5–5.1)
Total Bilirubin: 0.3 mg/dL (ref 0.3–1.2)

## 2012-06-07 MED ORDER — SODIUM PERTECHNETATE TC 99M INJECTION
10.0000 | Freq: Once | INTRAVENOUS | Status: AC | PRN
  Administered 2012-06-07: 11 via INTRAVENOUS

## 2012-06-07 NOTE — Progress Notes (Signed)
Labs drawn today for cbc/diff,cmp 

## 2012-06-10 ENCOUNTER — Ambulatory Visit (HOSPITAL_COMMUNITY)
Admission: RE | Admit: 2012-06-10 | Discharge: 2012-06-10 | Disposition: A | Discharge status: Home / Self Care | Payer: Medicare Other | Source: Ambulatory Visit | Attending: Oncology | Admitting: Oncology

## 2012-06-10 ENCOUNTER — Ambulatory Visit (HOSPITAL_COMMUNITY): Admission: RE | Admit: 2012-06-10 | Payer: Medicare Other | Source: Ambulatory Visit

## 2012-06-10 DIAGNOSIS — M899 Disorder of bone, unspecified: Secondary | ICD-10-CM | POA: Insufficient documentation

## 2012-06-10 DIAGNOSIS — C50919 Malignant neoplasm of unspecified site of unspecified female breast: Secondary | ICD-10-CM | POA: Insufficient documentation

## 2012-06-10 DIAGNOSIS — Z9221 Personal history of antineoplastic chemotherapy: Secondary | ICD-10-CM | POA: Insufficient documentation

## 2012-06-10 DIAGNOSIS — M5137 Other intervertebral disc degeneration, lumbosacral region: Secondary | ICD-10-CM | POA: Insufficient documentation

## 2012-06-10 DIAGNOSIS — Z9010 Acquired absence of unspecified breast and nipple: Secondary | ICD-10-CM | POA: Insufficient documentation

## 2012-06-10 DIAGNOSIS — M51379 Other intervertebral disc degeneration, lumbosacral region without mention of lumbar back pain or lower extremity pain: Secondary | ICD-10-CM | POA: Insufficient documentation

## 2012-06-10 DIAGNOSIS — C50911 Malignant neoplasm of unspecified site of right female breast: Secondary | ICD-10-CM

## 2012-06-10 DIAGNOSIS — Z923 Personal history of irradiation: Secondary | ICD-10-CM | POA: Insufficient documentation

## 2012-06-10 DIAGNOSIS — K7689 Other specified diseases of liver: Secondary | ICD-10-CM | POA: Insufficient documentation

## 2012-06-10 MED ORDER — IOHEXOL 300 MG/ML  SOLN
100.0000 mL | Freq: Once | INTRAMUSCULAR | Status: AC | PRN
  Administered 2012-06-10: 100 mL via INTRAVENOUS

## 2012-06-11 ENCOUNTER — Ambulatory Visit (INDEPENDENT_AMBULATORY_CARE_PROVIDER_SITE_OTHER): Payer: Medicare Other | Admitting: Surgery

## 2012-06-11 ENCOUNTER — Other Ambulatory Visit (HOSPITAL_COMMUNITY): Payer: Self-pay | Admitting: Oncology

## 2012-06-11 ENCOUNTER — Encounter (INDEPENDENT_AMBULATORY_CARE_PROVIDER_SITE_OTHER): Payer: Self-pay | Admitting: Surgery

## 2012-06-11 VITALS — BP 150/62 | HR 76 | Temp 97.3°F | Resp 16 | Ht 60.0 in | Wt 169.8 lb

## 2012-06-11 DIAGNOSIS — E041 Nontoxic single thyroid nodule: Secondary | ICD-10-CM

## 2012-06-11 DIAGNOSIS — Z09 Encounter for follow-up examination after completed treatment for conditions other than malignant neoplasm: Secondary | ICD-10-CM

## 2012-06-11 NOTE — Patient Instructions (Addendum)
Call me Friday to let me know how you are doing

## 2012-06-11 NOTE — Progress Notes (Signed)
Patient comes in for recheck status post bilateral mastectomies. She continues to have some serous drainage from the medial and of the incision on the left  Exam: She has a seroma the right side. I aspirated it and got 15cc of clear fluid.on the left there is a smaller seroma aspirated and received 5cc I opened and debrides a narrow section of skin on the medial left to allow better drainage  Impression: Bilateral postmastectomy seromatous  Plan:she will come back next week for a recheck and possible re\re aspiration She will call me Friday to let me know if there is nanymor accumulation of fluid

## 2012-06-12 ENCOUNTER — Encounter (HOSPITAL_COMMUNITY): Payer: Self-pay

## 2012-06-12 ENCOUNTER — Encounter (HOSPITAL_COMMUNITY)
Admission: RE | Admit: 2012-06-12 | Discharge: 2012-06-12 | Disposition: A | Discharge status: Home / Self Care | Payer: Medicare Other | Source: Ambulatory Visit | Attending: Oncology | Admitting: Oncology

## 2012-06-12 ENCOUNTER — Ambulatory Visit (HOSPITAL_COMMUNITY)
Admission: RE | Admit: 2012-06-12 | Discharge: 2012-06-12 | Disposition: A | Discharge status: Home / Self Care | Payer: Medicare Other | Source: Ambulatory Visit | Attending: Oncology | Admitting: Oncology

## 2012-06-12 DIAGNOSIS — E041 Nontoxic single thyroid nodule: Secondary | ICD-10-CM | POA: Insufficient documentation

## 2012-06-12 DIAGNOSIS — C50919 Malignant neoplasm of unspecified site of unspecified female breast: Secondary | ICD-10-CM | POA: Insufficient documentation

## 2012-06-12 DIAGNOSIS — Z808 Family history of malignant neoplasm of other organs or systems: Secondary | ICD-10-CM | POA: Insufficient documentation

## 2012-06-12 DIAGNOSIS — C50911 Malignant neoplasm of unspecified site of right female breast: Secondary | ICD-10-CM

## 2012-06-12 DIAGNOSIS — Z853 Personal history of malignant neoplasm of breast: Secondary | ICD-10-CM | POA: Insufficient documentation

## 2012-06-12 MED ORDER — TECHNETIUM TC 99M MEDRONATE IV KIT
25.0000 | PACK | Freq: Once | INTRAVENOUS | Status: AC | PRN
  Administered 2012-06-12: 25 via INTRAVENOUS

## 2012-06-13 ENCOUNTER — Telehealth (INDEPENDENT_AMBULATORY_CARE_PROVIDER_SITE_OTHER): Payer: Self-pay

## 2012-06-13 NOTE — Telephone Encounter (Signed)
Returned pt's call - pt had a thyroid scan yesterday through her oncologist at [facility], was calling to see if Dr. Jamey Ripa had received the report.  Pt stated that she just wanted to "get on the ball" by calling our office to let Dr. Jamey Ripa know what dates she will be available for Bx.

## 2012-06-13 NOTE — Telephone Encounter (Signed)
Patient wanted to inform us of dates she is available for Biopsy or other test to be done. Availability is 06-13-12 thru 06-21-12.

## 2012-06-14 ENCOUNTER — Telehealth (INDEPENDENT_AMBULATORY_CARE_PROVIDER_SITE_OTHER): Payer: Self-pay

## 2012-06-14 ENCOUNTER — Encounter (INDEPENDENT_AMBULATORY_CARE_PROVIDER_SITE_OTHER): Payer: Medicare Other | Admitting: Surgery

## 2012-06-14 NOTE — Telephone Encounter (Signed)
Patient states masty incision site  Is better . Patient reported drainage is much better today. Patient is asking if she should have a 3 rd round of antibiotics she completed 2nd round of ABT 612-14. Patient states she had thyroid biopsy  this am @ [facility] . The oncologist told her she would need a U/S and Dr. Jamey Ripa  Most likely be ordering it. Please Advise

## 2012-06-17 NOTE — Telephone Encounter (Signed)
As long as open and draiing and improving should not need additional antibiotic. I am unclear on what she thinks the oncologist wants [place] to order.

## 2012-06-21 ENCOUNTER — Other Ambulatory Visit (INDEPENDENT_AMBULATORY_CARE_PROVIDER_SITE_OTHER): Payer: Self-pay

## 2012-06-21 ENCOUNTER — Ambulatory Visit (INDEPENDENT_AMBULATORY_CARE_PROVIDER_SITE_OTHER): Payer: Medicare Other | Admitting: Surgery

## 2012-06-21 ENCOUNTER — Encounter (INDEPENDENT_AMBULATORY_CARE_PROVIDER_SITE_OTHER): Payer: Self-pay | Admitting: Surgery

## 2012-06-21 ENCOUNTER — Encounter: Payer: Self-pay | Admitting: Oncology

## 2012-06-21 VITALS — BP 140/84 | HR 70 | Temp 97.0°F | Resp 16 | Ht 60.0 in | Wt 167.2 lb

## 2012-06-21 DIAGNOSIS — Z09 Encounter for follow-up examination after completed treatment for conditions other than malignant neoplasm: Secondary | ICD-10-CM

## 2012-06-21 DIAGNOSIS — E041 Nontoxic single thyroid nodule: Secondary | ICD-10-CM

## 2012-06-21 MED ORDER — DOXYCYCLINE HYCLATE 50 MG PO CAPS: 100.0000 mg | ORAL_CAPSULE | Freq: Two times a day (BID) | ORAL | Status: DC

## 2012-06-21 NOTE — Progress Notes (Signed)
Patient comes in for recheck status post bilateral mastectomies. She continues to have some serous drainage from the medial and of the incision on the left In addition, she has a thyroid nodule that was recently found and radiology recommended a FNA. Of note is that her brother had medullary thyroid cancer.  Exam: She has a seroma the right side. I aspirated it and got 30ccof clear fluid.on the left there is a smaller seroma aspirated and received 5cc I opened and debrides a narrow section of skin on the medial left to allow better drainage and put a wick in as there is a bit of a cavity  Impression: Bilateral postmastectomy seromatous  Plan:She will see me again in two weeks; will order a thyroid FNA based on the sono report.She has a Astronomer appointment scheduled already. Discussed sono report with her. Will continue antibiotic until wound heals

## 2012-06-24 ENCOUNTER — Other Ambulatory Visit (HOSPITAL_COMMUNITY): Payer: Self-pay

## 2012-06-24 DIAGNOSIS — C50911 Malignant neoplasm of unspecified site of right female breast: Secondary | ICD-10-CM

## 2012-06-24 DIAGNOSIS — Z853 Personal history of malignant neoplasm of breast: Secondary | ICD-10-CM

## 2012-06-25 ENCOUNTER — Ambulatory Visit (HOSPITAL_COMMUNITY)
Admission: RE | Admit: 2012-06-25 | Discharge: 2012-06-25 | Disposition: A | Discharge status: Home / Self Care | Payer: Medicare Other | Source: Ambulatory Visit | Attending: Surgery | Admitting: Surgery

## 2012-06-25 ENCOUNTER — Other Ambulatory Visit (HOSPITAL_COMMUNITY): Payer: Self-pay | Admitting: Surgery

## 2012-06-25 ENCOUNTER — Ambulatory Visit (HOSPITAL_COMMUNITY): Admission: RE | Admit: 2012-06-25 | Payer: Medicare Other | Source: Ambulatory Visit

## 2012-06-25 ENCOUNTER — Other Ambulatory Visit (INDEPENDENT_AMBULATORY_CARE_PROVIDER_SITE_OTHER): Payer: Self-pay | Admitting: Surgery

## 2012-06-25 DIAGNOSIS — E041 Nontoxic single thyroid nodule: Secondary | ICD-10-CM | POA: Insufficient documentation

## 2012-06-25 DIAGNOSIS — Z808 Family history of malignant neoplasm of other organs or systems: Secondary | ICD-10-CM | POA: Insufficient documentation

## 2012-06-25 MED ORDER — LIDOCAINE HCL (PF) 2 % IJ SOLN
INTRAMUSCULAR | Status: AC
  Administered 2012-06-25: 3 mL via INTRADERMAL
  Filled 2012-06-25: qty 10

## 2012-06-25 NOTE — Procedures (Signed)
PreOperative Dx: RIGHT thyroid nodule Postoperative Dx: RIGHT thyroid nodule Procedure:   US guided FNA RIGHT thyroid nodule Radiologist:  Tyron Russell Anesthesia:  1.5 ml of 2 lidocaine Specimen:  FNA x 3  EBL:   None Complications: None

## 2012-06-25 NOTE — Progress Notes (Signed)
Lidocaine 2%              3mL injected                 Right thyroid biopsies performed

## 2012-06-26 ENCOUNTER — Telehealth (HOSPITAL_COMMUNITY): Payer: Self-pay

## 2012-06-26 NOTE — Telephone Encounter (Signed)
Amy Houston wanted to let Dr. Mariel Sleet know that she had the biopsy of her thyroid yesterday and wants to know if it would be appropriate/ possible to biopsy one of the pulmonary nodules or do a PET scan to make sure that those areas are not cancer?

## 2012-06-28 ENCOUNTER — Telehealth (HOSPITAL_COMMUNITY): Payer: Self-pay

## 2012-06-28 NOTE — Telephone Encounter (Signed)
Discussed with Dr. Mariel Sleet patient's concerns regarding pulmonary nodules.  Per MD nodules are too small for PET scan and nothing else other than CT in December needs to be done.  Patient notified.

## 2012-07-01 ENCOUNTER — Telehealth: Payer: Self-pay | Admitting: Gastroenterology

## 2012-07-01 ENCOUNTER — Ambulatory Visit (HOSPITAL_COMMUNITY)
Admission: RE | Admit: 2012-07-01 | Discharge: 2012-07-01 | Disposition: A | Discharge status: Home / Self Care | Payer: Medicare Other | Source: Ambulatory Visit | Attending: Gastroenterology | Admitting: Gastroenterology

## 2012-07-01 ENCOUNTER — Other Ambulatory Visit: Payer: Self-pay | Admitting: Gastroenterology

## 2012-07-01 ENCOUNTER — Encounter (HOSPITAL_COMMUNITY)
Admission: RE | Disposition: A | Discharge status: Home / Self Care | Payer: Self-pay | Source: Ambulatory Visit | Attending: Gastroenterology

## 2012-07-01 ENCOUNTER — Encounter (HOSPITAL_COMMUNITY): Payer: Self-pay | Admitting: *Deleted

## 2012-07-01 DIAGNOSIS — Z807 Family history of other malignant neoplasms of lymphoid, hematopoietic and related tissues: Secondary | ICD-10-CM | POA: Insufficient documentation

## 2012-07-01 DIAGNOSIS — K2990 Gastroduodenitis, unspecified, without bleeding: Secondary | ICD-10-CM

## 2012-07-01 DIAGNOSIS — R197 Diarrhea, unspecified: Secondary | ICD-10-CM

## 2012-07-01 DIAGNOSIS — R1310 Dysphagia, unspecified: Secondary | ICD-10-CM

## 2012-07-01 DIAGNOSIS — Z79899 Other long term (current) drug therapy: Secondary | ICD-10-CM | POA: Insufficient documentation

## 2012-07-01 DIAGNOSIS — D369 Benign neoplasm, unspecified site: Secondary | ICD-10-CM

## 2012-07-01 DIAGNOSIS — F329 Major depressive disorder, single episode, unspecified: Secondary | ICD-10-CM | POA: Insufficient documentation

## 2012-07-01 DIAGNOSIS — K5730 Diverticulosis of large intestine without perforation or abscess without bleeding: Secondary | ICD-10-CM | POA: Insufficient documentation

## 2012-07-01 DIAGNOSIS — D126 Benign neoplasm of colon, unspecified: Secondary | ICD-10-CM | POA: Insufficient documentation

## 2012-07-01 DIAGNOSIS — C50919 Malignant neoplasm of unspecified site of unspecified female breast: Secondary | ICD-10-CM | POA: Insufficient documentation

## 2012-07-01 DIAGNOSIS — Z9010 Acquired absence of unspecified breast and nipple: Secondary | ICD-10-CM | POA: Insufficient documentation

## 2012-07-01 DIAGNOSIS — F3289 Other specified depressive episodes: Secondary | ICD-10-CM | POA: Insufficient documentation

## 2012-07-01 DIAGNOSIS — F411 Generalized anxiety disorder: Secondary | ICD-10-CM | POA: Insufficient documentation

## 2012-07-01 DIAGNOSIS — Z806 Family history of leukemia: Secondary | ICD-10-CM | POA: Insufficient documentation

## 2012-07-01 DIAGNOSIS — K219 Gastro-esophageal reflux disease without esophagitis: Secondary | ICD-10-CM

## 2012-07-01 DIAGNOSIS — K648 Other hemorrhoids: Secondary | ICD-10-CM | POA: Insufficient documentation

## 2012-07-01 DIAGNOSIS — K2970 Gastritis, unspecified, without bleeding: Secondary | ICD-10-CM

## 2012-07-01 DIAGNOSIS — G4730 Sleep apnea, unspecified: Secondary | ICD-10-CM | POA: Insufficient documentation

## 2012-07-01 DIAGNOSIS — K3189 Other diseases of stomach and duodenum: Secondary | ICD-10-CM | POA: Insufficient documentation

## 2012-07-01 DIAGNOSIS — E119 Type 2 diabetes mellitus without complications: Secondary | ICD-10-CM | POA: Insufficient documentation

## 2012-07-01 DIAGNOSIS — Z801 Family history of malignant neoplasm of trachea, bronchus and lung: Secondary | ICD-10-CM | POA: Insufficient documentation

## 2012-07-01 DIAGNOSIS — K2940 Chronic atrophic gastritis without bleeding: Secondary | ICD-10-CM | POA: Insufficient documentation

## 2012-07-01 DIAGNOSIS — Z860100 Personal history of colon polyps, unspecified: Secondary | ICD-10-CM | POA: Insufficient documentation

## 2012-07-01 DIAGNOSIS — K589 Irritable bowel syndrome without diarrhea: Secondary | ICD-10-CM | POA: Insufficient documentation

## 2012-07-01 DIAGNOSIS — K222 Esophageal obstruction: Secondary | ICD-10-CM

## 2012-07-01 DIAGNOSIS — I10 Essential (primary) hypertension: Secondary | ICD-10-CM | POA: Insufficient documentation

## 2012-07-01 DIAGNOSIS — Z809 Family history of malignant neoplasm, unspecified: Secondary | ICD-10-CM | POA: Insufficient documentation

## 2012-07-01 DIAGNOSIS — Q393 Congenital stenosis and stricture of esophagus: Secondary | ICD-10-CM | POA: Insufficient documentation

## 2012-07-01 DIAGNOSIS — Q391 Atresia of esophagus with tracheo-esophageal fistula: Secondary | ICD-10-CM | POA: Insufficient documentation

## 2012-07-01 HISTORY — PX: COLONOSCOPY WITH ESOPHAGOGASTRODUODENOSCOPY (EGD): SHX5779

## 2012-07-01 HISTORY — PX: ESOPHAGEAL DILATION: SHX303

## 2012-07-01 SURGERY — COLONOSCOPY WITH ESOPHAGOGASTRODUODENOSCOPY (EGD)
Anesthesia: Moderate Sedation

## 2012-07-01 MED ORDER — MIDAZOLAM HCL 5 MG/5ML IJ SOLN
INTRAMUSCULAR | Status: AC
  Filled 2012-07-01: qty 10

## 2012-07-01 MED ORDER — STERILE WATER FOR IRRIGATION IR SOLN
Status: DC | PRN
  Administered 2012-07-01: 12:00:00

## 2012-07-01 MED ORDER — MEPERIDINE HCL 100 MG/ML IJ SOLN
INTRAMUSCULAR | Status: AC
  Filled 2012-07-01: qty 1

## 2012-07-01 MED ORDER — OMEPRAZOLE 20 MG PO CPDR: DELAYED_RELEASE_CAPSULE | ORAL | Status: DC

## 2012-07-01 MED ORDER — BUTAMBEN-TETRACAINE-BENZOCAINE 2-2-14 % EX AERO
INHALATION_SPRAY | CUTANEOUS | Status: DC | PRN
  Administered 2012-07-01: 2 via TOPICAL

## 2012-07-01 MED ORDER — MIDAZOLAM HCL 5 MG/5ML IJ SOLN
INTRAMUSCULAR | Status: DC | PRN
  Administered 2012-07-01 (×2): 1 mg via INTRAVENOUS
  Administered 2012-07-01: 2 mg via INTRAVENOUS
  Administered 2012-07-01: 1 mg via INTRAVENOUS
  Administered 2012-07-01: 2 mg via INTRAVENOUS

## 2012-07-01 MED ORDER — SODIUM CHLORIDE 0.9 % IV SOLN
INTRAVENOUS | Status: DC
  Administered 2012-07-01: 11:00:00 via INTRAVENOUS

## 2012-07-01 MED ORDER — MEPERIDINE HCL 100 MG/ML IJ SOLN
INTRAMUSCULAR | Status: DC | PRN
  Administered 2012-07-01 (×5): 25 mg via INTRAVENOUS

## 2012-07-01 NOTE — H&P (Addendum)
Primary Care Physician:  HAWKINS,EDWARD L, MD Primary Gastroenterologist:  Dr. Jahnasia Tatum  Pre-Procedure History & Physical: HPI:  Amy Houston is a 67 y.o. female here for DYSPEPSIA/personal history of polyps/CHRONIC INTERMITTENT DIARRHEA. RECENT DIAGNOSIS OF BREAST CA.   Past Medical History  Diagnosis Date  . HTN (hypertension)   . Anxiety and depression   . GERD (gastroesophageal reflux disease)     tums  . IBS (irritable bowel syndrome)   . Hemorrhoid   . History of blood transfusion     post child birth  . PPD positive, treated 1999  . PONV (postoperative nausea and vomiting)   . Exertional shortness of breath   . Sleep apnea     "gone after I had OR" (05/08/2012)  . Diabetes mellitus 1999    "dx; never on meds" (05/08/2012)  . H/O hiatal hernia   . Arthritis     "of the spine" (05/08/2012)  . Anxiety   . Depression   . Breast cancer 1995    "left" (05/08/2012)  . Breast cancer, right breast, IDC, Multifocal 03/12/2012    Multifocal, 10 and 8 left breast     Past Surgical History  Procedure Laterality Date  . Tubal ligation  1976  . Mastectomy complete / simple w/ sentinel node biopsy Right 05/08/2012  . Mastectomy complete / simple Left 05/08/2012  . Uvulopalatopharyngoplasty, tonsillectomy and septoplasty  ~ 2008  . Cholecystectomy  1992  . Dilation and curettage of uterus  1968  . Tonsillectomy and adenoidectomy  ~ 2008  . Breast lumpectomy Left 1995  . Breast biopsy Left 1995  . Breast biopsy Bilateral 03/2012    "one on the left; 2 on the right" (05/08/2012)  . Simple mastectomy with axillary sentinel node biopsy Right 05/08/2012    Procedure: RIGHT TOTAL MASTECTOMY WITH AXILLARY SENTINEL NODE BIOPSY;  Surgeon: Christian J Streck, MD;  Location: [facility] OR;  Service: General;  Laterality: Right;  . Simple mastectomy with axillary sentinel node biopsy Left 05/08/2012    Procedure: LEFT TOTAL MASTECTOMY;  Surgeon: Christian J Streck, MD;  Location: [facility] OR;  Service: General;   Laterality: Left;  . Colonoscopy  12/06/93    Dr. Hayes:single diminutive polyp of the descending colon/extrernal hemorrhoids, benign path  . Colonoscopy  11/30/98    Dr Hayes:small rectal polyp/internal hemorrhoids, benign path  . Colonoscopy  11/26/2006    Dr. Hayes:sigmoid polyp/small interal hemorrhoids, adenomatous  . Esophagogastroduodenoscopy  1994    Dr. Buccini: small hiatal hernia and Schatzki's ring widely patent, CLO test negative    Prior to Admission medications   Medication Sig Start Date End Date Taking? Authorizing Provider  Calcium Carbonate-Vitamin D (CALCIUM 600+D3 PO) Take 1 tablet by mouth 2 (two) times daily.   Yes Historical Provider, MD  cholecalciferol (VITAMIN D) 1000 UNITS tablet Take 2,000 Units by mouth daily.   Yes Historical Provider, MD  exemestane (AROMASIN) 25 MG tablet Take 1 tablet (25 mg total) by mouth daily after breakfast. 06/03/12  Yes Eric S Neijstrom, MD  lisinopril (PRINIVIL,ZESTRIL) 20 MG tablet Take 20 mg by mouth daily.   Yes Historical Provider, MD  metoprolol succinate (TOPROL-XL) 25 MG 24 hr tablet Take 25 mg by mouth daily.   Yes Historical Provider, MD  Multiple Vitamin (MULTIVITAMIN WITH MINERALS) TABS Take 1 tablet by mouth daily.   Yes Historical Provider, MD  omeprazole (PRILOSEC) 20 MG capsule Take 1 capsule (20 mg total) by mouth daily. 06/03/12  Yes Anna W Sams, NP    polyethylene glycol-electrolytes (TRILYTE) 420 G solution Take 4,000 mLs by mouth as directed. 06/03/12  Yes Khalidah Herbold L Mackenzey Crownover, MD  venlafaxine (EFFEXOR) 25 MG tablet Take 50 mg by mouth 2 (two) times daily.   Yes Historical Provider, MD  doxycycline (VIBRAMYCIN) 50 MG capsule Take 2 capsules (100 mg total) by mouth 2 (two) times daily. 06/21/12   Christian J Streck, MD    Allergies as of 06/03/2012 - Review Complete 06/03/2012  Allergen Reaction Noted  . Penicillins Rash 08/18/2010    Family History  Problem Relation Age of Onset  . Arthritis    . Cancer    . Diabetes     . Lung cancer Mother 60    smoker  . Lymphoma Father   . Leukemia Father   . Lung cancer Sister 47  . Thyroid cancer Brother 46  . Lymphoma Brother 60  . Breast cancer Maternal Aunt     mother's paternal half sister  . Cancer Maternal Uncle     unknown type; mother's paternal half brother  . Lung cancer Maternal Grandmother   . Cancer Maternal Aunt     unknown cancer; mother's maternal half sister  . Colon cancer Neg Hx     History   Social History  . Marital Status: Married    Spouse Name: N/A    Number of Children: N/A  . Years of Education: 12th grade   Occupational History  . 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 Smoking status: Never Smoker   . Smokeless tobacco: Never Used  . Alcohol Use: Yes     Comment: 05/08/2012 "margarita or glass of wine once or twice/yr"  . Drug Use: No  . Sexually Active: No   Other Topics Concern  . Not on file   Social History Narrative  . No narrative on file    Review of Systems: See HPI, otherwise negative ROS   Physical Exam: BP 168/76  Pulse 79  Temp(Src) 98.6 F (37 C) (Oral)  Resp 18  SpO2 92% General:   Alert,  pleasant and cooperative in NAD Head:  Normocephalic and atraumatic. Neck:  Supple; Lungs:  Clear throughout to auscultation.    Heart:  Regular rate and rhythm. Abdomen:  Soft, nontender and nondistended. Normal bowel sounds, without guarding, and without rebound.   Neurologic:  Alert and  oriented x4;  grossly normal neurologically.  Impression/Plan:     DYSPEPSIA/personal history of polyps/CHRONIC INTERMITTENT DIARRHEA  PLAN:  TCS/EGD TODAY  

## 2012-07-01 NOTE — Telephone Encounter (Signed)
Patient is scheduled for EGD/ED w/SLF on 7/22 at 8:45

## 2012-07-01 NOTE — Telephone Encounter (Signed)
Amy Houston from short stay called to set up pp fu in 4 months with SF in E30 and also to schedule a EGD w/ED in 2-3 weeks. I have nic'ed the follow up visit in epic, but need someone to call patient to schedule procedure.

## 2012-07-02 ENCOUNTER — Encounter (HOSPITAL_COMMUNITY): Payer: Self-pay | Admitting: Gastroenterology

## 2012-07-02 ENCOUNTER — Other Ambulatory Visit: Payer: Self-pay | Admitting: Gastroenterology

## 2012-07-02 ENCOUNTER — Telehealth: Payer: Self-pay | Admitting: Gastroenterology

## 2012-07-02 DIAGNOSIS — K222 Esophageal obstruction: Secondary | ICD-10-CM

## 2012-07-02 NOTE — Op Note (Addendum)
[facility] [address] [place], [address]   COLONOSCOPY PROCEDURE REPORT  PATIENT: Amy Houston, Amy Houston  MR#: [id number] BIRTHDATE: [birth year] , 67  yrs. old GENDER: Female ENDOSCOPIST: Jonette Eva, MD REFERRED XB:JYNWGN Juanetta Gosling, M.D.  Glenford Peers, M.D. PROCEDURE DATE:  07/01/2012 PROCEDURE:   Colonoscopy with cold biopsy polypectomy  & RANDOM COLD FORCEPS BIOPSIES INDICATIONS:High risk patient with personal history of colonic polyps.  CHRONIC INTERMITTENT DIARRHEA MEDICATIONS: Demerol 100 mg IV and Versed 5 mg IV  DESCRIPTION OF PROCEDURE:    Physical exam was performed.  Informed consent was obtained from the patient after explaining the benefits, risks, and alternatives to procedure.  The patient was connected to monitor and placed in left lateral position. Continuous oxygen was provided by nasal cannula and IV medicine administered through an indwelling cannula.  After administration of sedation and rectal exam, the patients rectum was intubated and the EC-3890Li ([id number]) and EC-3890Li ([id number])  colonoscope was advanced under direct visualization to the ileum.  The scope was removed slowly by carefully examining the color, texture, anatomy, and integrity mucosa on the way out.  The patient was recovered in endoscopy and discharged home in satisfactory condition.    COLON FINDINGS: The mucosa appeared normal in the terminal ileum.  10-15 CM VISUALIZED.  , Two sessile polyps measuring 2 mm in size were found in the transverse colon.  A polypectomy was performed with cold forceps.  , Mild diverticulosis was noted in the sigmoid colon.  OTHERWISE NORMAL COLON. RANDOM BIOPSIES OBTAINED VIA COLD FORCEPS TO EVALUATE FOR MICROSCOPIC COLITIS.  Small internal hemorrhoids were found.  PREP QUALITY: good.  CECAL W/D TIME: 10 minutes      COMPLICATIONS: PT AGITATED DURING TCS/EGD BUT NO RECALL IN POSTOP  ENDOSCOPIC IMPRESSION: 1.   Normal mucosa in the  terminal ileum 2.   Two SMALL COLON POLYPS REMOVED 3.   Mild diverticulosis was noted in the sigmoid colon 4.   Small internal hemorrhoids  RECOMMENDATIONS: FOLLOW A HIGH FIBER/LOW FAT DIET.  AVOID ITEMS THAT CAUSE BLOATING.  BIOPSY WILL BE BACK IN 7 DAYS. Next colonoscopy in 5 years. CONSIDER OVERTUBE.       _______________________________ Rosalie DoctorJonette Eva, MD 07/02/2012 8:58 AM Revised: 07/02/2012 8:58 AM    PATIENT NAME:  Amy, Houston MR#: [id number]

## 2012-07-02 NOTE — Telephone Encounter (Signed)
Message copied by Glendora Score on Tue Jul 02, 2012 11:41 AM ------      Message from: West Bali      Created: Tue Jul 02, 2012  9:04 AM       NEEDS EGD/DIL IN 2-3 WEEKS FOR ESOPHAGEAL STRICTURE            OPV IN 4 MOS E30 DIARRHEA/DYSPHAGIA ------

## 2012-07-02 NOTE — Op Note (Signed)
[facility] [address] [place], [address]   ENDOSCOPY PROCEDURE REPORT  PATIENT: Trace, Cederberg  MR#: [id number] BIRTHDATE: May 09, 1945 , 67  yrs. old GENDER: Female  ENDOSCOPIST: Jonette Eva, MD REFFERED XB:JYNWGN Juanetta Gosling, M.D.  Glenford Peers, M.D.  PROCEDURE DATE:  07/01/2012 PROCEDURE:   EGD with biopsy and EGD with dilatation over guidewire   INDICATIONS:1.  dyspepsia.   2.  dysphagia.   3.  CHRONIC INTERMITTENT DIARRHEA. MEDICATIONS: TCS + Demerol 25 mg IV and Versed 2 mg IV TOPICAL ANESTHETIC: Cetacaine Spray  DESCRIPTION OF PROCEDURE:   After the risks benefits and alternatives of the procedure were thoroughly explained, informed consent was obtained.  The EC-3890Li ([id number]), EG-[id number] ([id number]), and GE-[id number] ([id number])  endoscope was introduced through the mouth and advanced to the second portion of the duodenum. The instrument was slowly withdrawn as the mucosa was carefully examined.  Prior to withdrawal of the scope, the guidwire was placed.  The esophagus was dilated successfully.  The patient was recovered in endoscopy and discharged home in satisfactory condition.   ESOPHAGUS: UNABLE TO INTUBATE ESOPHAGUS WITH DIAGNOSTIC GASTROSCOPE DUE TO PROXIMAL ESOPHAGEAL WEB.  ABLE TO PASS PEDITARIC GASTROSCOPE WITH MILD DIFFICULTY.   A moderately severe Schatzki ring was found at the gastroesophageal junction.   STOMACH: Mild non-erosive gastritis (inflammation) was found in the gastric antrum.  Multiple biopsies were performed using cold forceps.   DUODENUM: The duodenal mucosa showed no abnormalities in the bulb and second portion of the duodenum.  Cold forcep biopsies were taken in the second portion TO EVALUATE FOR CELIAC SPRUE.  Dilation was then performed at the proximal esophagus Dilator: Savary over guidewire Size(s): 10-12 MM Resistance: moderate  COMPLICATIONS: PT AGITATED DURING EGD/TCS WITH NO RECALL IN POST-OP.  ENDOSCOPIC  IMPRESSION: 1.   PROXIMAL ESOPHAGEAL WEB 2.   Schatzki ring at the gastroesophageal junction 3.   MILD Non-erosive gastritis  RECOMMENDATIONS: CONTINUE OMEPRAZOLE once daily 30 MINUTES PRIOR TO BREAKFAST FOREVER. FOLLOW A HIGH FIBER/LOW FAT DIET.  AVOID ITEMS THAT CAUSE BLOATING.  BIOPSY WILL BE BACK IN 7 DAYS. REPEAT EGD/DIL IN 2-3 WEEKS. OPV IN 4 MOS. IF SWALLOWING PROBLEMS CONTINUE, PT NEEDS BPE.      _______________________________ Rosalie DoctorJonette Eva, MD 07/02/2012 9:08 AM      PATIENT NAME:  Kassia, Demarinis MR#: [id number]

## 2012-07-02 NOTE — Telephone Encounter (Signed)
REVIEWED.  

## 2012-07-02 NOTE — Telephone Encounter (Signed)
Patient is scheduled for EGD/ED on July 15th an I have mailed her instructions

## 2012-07-03 ENCOUNTER — Encounter (HOSPITAL_COMMUNITY): Payer: Self-pay

## 2012-07-04 ENCOUNTER — Telehealth: Payer: Self-pay

## 2012-07-04 NOTE — Telephone Encounter (Signed)
Pt would like to talk with you about her up coming EGD. She has some question about it. She is also wanting to know about the bx from the last EGD. She can be reached at  [phone].Thanks

## 2012-07-08 ENCOUNTER — Encounter (INDEPENDENT_AMBULATORY_CARE_PROVIDER_SITE_OTHER): Payer: Self-pay | Admitting: Surgery

## 2012-07-08 ENCOUNTER — Ambulatory Visit (INDEPENDENT_AMBULATORY_CARE_PROVIDER_SITE_OTHER): Payer: Medicare Other | Admitting: Surgery

## 2012-07-08 ENCOUNTER — Telehealth: Payer: Self-pay

## 2012-07-08 VITALS — BP 130/70 | HR 82 | Temp 97.4°F | Resp 16 | Ht 60.0 in | Wt 170.2 lb

## 2012-07-08 DIAGNOSIS — Z09 Encounter for follow-up examination after completed treatment for conditions other than malignant neoplasm: Secondary | ICD-10-CM

## 2012-07-08 NOTE — Telephone Encounter (Signed)
Pt called and asked why she has to have a repeat EGD/ED so soon. She said if it is because scoping was not complete, she is fine with that. However, if it is because of swallowing, she DOES NOT have any problems with swallowing and therefore would not want to do it again. She would like for Dr. Darrick Penna to call her on her cell at [phone] at her convenience. ( She is rescheduled for 07/23/2012 ).

## 2012-07-08 NOTE — Patient Instructions (Signed)
See me again in about a month, but if he get more fluid under the right mastectomy incision or more problems on the left call me and I will see you sooner

## 2012-07-08 NOTE — Progress Notes (Signed)
Patient comes in for recheck status post bilateral mastectomies. She continues to have some serous drainage from the medial and of the incision on the left In addition, she has a thyroid nodule that was recently found and radiology recommended a FNA. Of note is that her brother had medullary thyroid cancer.  Exam: She has a seroma the right side. I aspirated it and got 20ccof clear fluid.on the left there is no residual seroma, although the area is still red. The open area stopped draining  Impression: Bilateral postmastectomy seromas, resolving Thyroid c/w goiter Neg endo and colonoscopy for cancer  Plan RTC one month, although sooner if fluid re accumulates on right or more problems on left.

## 2012-07-10 ENCOUNTER — Encounter (HOSPITAL_COMMUNITY): Payer: Self-pay | Admitting: Pharmacy Technician

## 2012-07-10 ENCOUNTER — Encounter: Payer: Self-pay | Admitting: Genetic Counselor

## 2012-07-10 ENCOUNTER — Ambulatory Visit (HOSPITAL_BASED_OUTPATIENT_CLINIC_OR_DEPARTMENT_OTHER): Payer: Medicare Other | Admitting: Genetic Counselor

## 2012-07-10 DIAGNOSIS — C50911 Malignant neoplasm of unspecified site of right female breast: Secondary | ICD-10-CM

## 2012-07-10 DIAGNOSIS — IMO0002 Reserved for concepts with insufficient information to code with codable children: Secondary | ICD-10-CM

## 2012-07-10 DIAGNOSIS — C50419 Malignant neoplasm of upper-outer quadrant of unspecified female breast: Secondary | ICD-10-CM

## 2012-07-10 NOTE — Telephone Encounter (Addendum)
Please call pt. She had ONE simple adenoma removed from her colon. HER stomach Bx shows gastritis.  HER SMALL BOWEL/RANDOM COLON BIOPSIES ARE NORMAL. HER DIARRHEA IS MOST LIKELY DUE TO IBS.   CONTINUE OMEPRAZOLE once daily 30 MINUTES PRIOR TO BREAKFAST FOREVER.  FOLLOW A HIGH FIBER/LOW FAT DIET. AVOID ITEMS THAT CAUSE BLOATING.   REPEAT EGD ON JUL 22 TO COMPLETE DILATING YOUR ESOPHAGUS.  OPV IN OCT 2014 E30 DIARRHEA, ESOPHAGEAL STRICTURE  Next colonoscopy in 5 years.

## 2012-07-10 NOTE — Telephone Encounter (Signed)
REVIEWED.  

## 2012-07-10 NOTE — Progress Notes (Signed)
Amy Houston, a 67 y.o. 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  She presents to clinic today to discuss the possibility of a genetic predisposition to cancer, and to further clarify her risks, as well as her family members' risks for cancer.   HISTORY OF PRESENT ILLNESS: In 1996, at the age of 15, Amy Houston was diagnosed with breast cancer of the left breast. This was treated with lumpectomy. In 2014, at the age of 40 she was diagnosed with DCIS of the right breast. This was treated with a double mastectomy.  Her tumor was ER+/PR+.  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  Recently she had a colonoscopy where polyps were found.  Past Medical History  Diagnosis Date  . HTN (hypertension)   . Anxiety and depression   . GERD (gastroesophageal reflux disease)     tums  . IBS (irritable bowel syndrome)   . Hemorrhoid   . History of blood transfusion     post child birth  . PPD positive, treated 1999  . PONV (postoperative nausea and vomiting)   . Exertional shortness of breath   . Sleep apnea     "gone after I had OR" (05/08/2012)  . Diabetes mellitus 1999    "dx; never on meds" (05/08/2012)  . H/O hiatal hernia   . Arthritis     "of the spine" (05/08/2012)  . Anxiety   . Depression   . Breast cancer 1995    "left" (05/08/2012)  . Breast cancer, right breast, IDC, Multifocal 03/12/2012    Multifocal, 10 and 8 left breast     Past Surgical History  Procedure Laterality Date  . Tubal ligation  1976  . Mastectomy complete / simple w/ sentinel node biopsy Right 05/08/2012  . Mastectomy complete / simple Left 05/08/2012  . Uvulopalatopharyngoplasty, tonsillectomy and septoplasty  ~ 2008  . Cholecystectomy  1992  . Dilation and curettage of uterus  1968  . Tonsillectomy and adenoidectomy  ~ 2008  . Breast lumpectomy Left 1995  .  Breast biopsy Left 1995  . Breast biopsy Bilateral 03/2012    "one on the left; 2 on the right" (05/08/2012)  . Simple mastectomy with axillary sentinel node biopsy Right 05/08/2012    Procedure: RIGHT TOTAL MASTECTOMY WITH AXILLARY SENTINEL NODE BIOPSY;  Surgeon: Currie Paris, MD;  Location: [facility] OR;  Service: General;  Laterality: Right;  . Simple mastectomy with axillary sentinel node biopsy Left 05/08/2012    Procedure: LEFT TOTAL MASTECTOMY;  Surgeon: Currie Paris, MD;  Location: [facility] OR;  Service: General;  Laterality: Left;  . Colonoscopy  12/06/93    Dr. Hayes:single diminutive polyp of the descending colon/extrernal hemorrhoids, benign path  . Colonoscopy  11/30/98    Dr Hayes:small rectal polyp/internal hemorrhoids, benign path  . Colonoscopy  11/26/2006    Dr. Hayes:sigmoid polyp/small interal hemorrhoids, adenomatous  . Esophagogastroduodenoscopy  1994    Dr. Matthias Hughs: small hiatal hernia and Schatzki's ring widely patent, CLO test negative  . Colonoscopy with esophagogastroduodenoscopy (egd) N/A 07/01/2012    Procedure: COLONOSCOPY WITH ESOPHAGOGASTRODUODENOSCOPY (EGD);  Surgeon: West Bali, MD;  Location: [facility] END[facility] SUITE;  Service: Endoscopy;  Laterality: N/A;  12:30-rescheduled to 1130 [address] notified pt  . Esophageal dilation  07/01/2012    Procedure: ESOPHAGEAL DILATION;  Surgeon: West Bali, MD;  Location: [facility] END[facility];  Service: Endoscopy;;    History   Social History  . Marital Status: Married    Spouse Name: N/A    Number of Children: 2  . Years of Education: 12th grade   Occupational History  . 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 Smoking status: Never Smoker   . Smokeless tobacco: Never Used  . Alcohol Use: Yes     Comment: 05/08/2012 "margarita or glass of wine once or twice/yr"  . Drug Use: No  . Sexually Active: No   Other Topics Concern  . None   Social History Narrative  . None    FAMILY HISTORY:  We obtained a  detailed, 4-generation family history.  Significant diagnoses are listed below: Family History  Problem Relation Age of Onset  . Lung cancer Mother 2    smoker  . Leukemia Father 83    Hairy Cell at 64, CLL at 15  . Lymphoma Father 60    Hodgkins lymphoma  . Lung cancer Sister 67  . Thyroid cancer Brother 46    Medullary  . Lymphoma Brother 60    Follicular lymphoma  . Breast cancer Maternal Aunt     mother's paternal half sister  . Lung cancer Maternal Uncle     mother's paternal half brother  . Lung cancer Maternal Grandmother     smoker  . Cancer Paternal Uncle     oral cancer dx in his 67s  . Other Daughter     leiomyoma of esophagus  . Colon cancer Maternal Aunt     mother's maternal half sister  . Lung cancer Cousin     paternal cousin  . Breast cancer Cousin     paternal cousin died in her 69s  Her family history was updated and several other family members had their cancer's clarified.  Of note, her brother was diagnosed at age 48 with Medullary thyroid cancer, rather than follicular thyroid cancer.  Patient's ancestors are of unknown descent. There is no reported Ashkenazi Jewish ancestry. There is no  known consanguinity.  GENETIC COUNSELING RISK ASSESSMENT, DISCUSSION, AND SUGGESTED FOLLOW UP: We reviewed the natural history and genetic etiology of sporadic, familial and hereditary cancer syndromes.  PALB2 and CHEK2 are moderate risk genes.  Her mutation in CHEK2 is a common deletion called c.1100del, which is typically seen in the [place] european population.  We reviewed the risks for cancer based on these mutations.  We discussed that CHEK2 is associated with an increased risk for breast cancer in both men (~1%) and women (~20-48%), as well as prostate (24-44%) and colon cancer (up to ~10%).  PALB2 mutations increase the risk for breast cancer (25-40%), as well as prostate (not quantified), ovarian (not quantified) and pancreatic cancers (not  quantified).  Neither of these genes has clinical guidelines, so we would follow her based on the general risk for cancer and our experience following for other syndromes.  We discussed that having two deleterious mutations, also called double heterozygote, is uncommon.  There is not a lot of information about the risks associated with this state.  Most of the studies involving double heterozygosity involves the BRCA genes, or a BRCA gene with a moderate risk gene such as CHEK2 or PALB2.  These studies have found that there is not an increased risk in severity for breast cancer specifically, more than that of having one mutation. We discussed that there are  no studies of individuals with PALB2 and CHEK2 mutations to know if this risk is similar. Based on the literature, we would follow Ms. Kuipers, in regards to her breast cancer risk, based on the CHEK2 mutation, as that convey's the greatest risk.  Since Ms. Alvira has had a double mastectomy, she has reduced her risk for breast cancer to the greatest extent.    We also discussed the risk for other family members.  Each of her siblings and daughters has a 50% chance of having a CHEK2 mutation and a 50% chance of having a PALB2 mutation. We also discussed that they each have an increased risk (about a 25% chance) of also being a double heterozygote for these mutations. We reviewed the medical management changes that we would recommend, and that genetic testing would involve a blood test to look for the specific mutations found in the patient, although it would not be performed at the same lab ([facility]) as they do not do single site testing of these two genes.. They voiced understanding of this information.  Per the patient's request, we will contact her by telephone to discuss further testing of other family members once we have found a lab who will do single site testing. A follow up genetic counseling visit will be scheduled if indicated.  The  patient was seen for a total of 45 minutes, greater than 50% of which was spent face-to-face counseling.  This plan is being carried out per Dr. Glenford Peers recommendations.  This note will also be sent to the referring provider via the electronic medical record. The patient will be supplied with a summary of this genetic counseling discussion as well as educational information on the discussed hereditary cancer syndromes following the conclusion of their visit.   Patient was discussed with Dr. Drue Second.   _______________________________________________________________________ For Office Staff:  Number of people involved in session: 4 Was an Intern/ student involved with case: no

## 2012-07-10 NOTE — Telephone Encounter (Signed)
DISCUSSED WITH PT. NEEDS REPEAT EGD/DIL BECAUSE HER ESOPHAGUS IS TOO SMALL AND LARGE PIECES OF FOOD MAY GET STUCK IN HER ESOPHAGUS. SHE NEEDS TO COMPLETE HER DILATION.  PT VOICED HER UNDERSTANDING.

## 2012-07-11 NOTE — Telephone Encounter (Signed)
Pt aware.

## 2012-07-11 NOTE — Telephone Encounter (Signed)
Cc PCP 

## 2012-07-15 NOTE — Telephone Encounter (Signed)
Reminder in epic °

## 2012-07-16 ENCOUNTER — Telehealth (INDEPENDENT_AMBULATORY_CARE_PROVIDER_SITE_OTHER): Payer: Self-pay

## 2012-07-16 ENCOUNTER — Ambulatory Visit: Admit: 2012-07-16 | Payer: Self-pay | Admitting: Gastroenterology

## 2012-07-16 SURGERY — ESOPHAGOGASTRODUODENOSCOPY (EGD) WITH ESOPHAGEAL DILATION
Anesthesia: Moderate Sedation

## 2012-07-16 NOTE — Telephone Encounter (Signed)
Patient states she was instructed to call after she completed her antibiotic doxycycline to informed Dr. Jamey Ripa about her Left breast incision which  remains pale pink in color denies fever, drainage, odor, warmth . She is asking should she start another ABT if so she would like it call to [facility] pharmacy  in [place]. Please advised   # [phone]

## 2012-07-16 NOTE — Telephone Encounter (Signed)
I don't think she should start another antibiotic unless this changes. The area may stay with the light pink discoloration for a while

## 2012-07-16 NOTE — Telephone Encounter (Signed)
Advised patient DR. Streck did not feel she should start another ABT at this time . Advised to call if her condition changes

## 2012-07-23 ENCOUNTER — Encounter (HOSPITAL_COMMUNITY): Payer: Self-pay | Admitting: *Deleted

## 2012-07-23 ENCOUNTER — Ambulatory Visit (HOSPITAL_COMMUNITY)
Admission: RE | Admit: 2012-07-23 | Discharge: 2012-07-23 | Disposition: A | Discharge status: Home / Self Care | Payer: Medicare Other | Source: Ambulatory Visit | Attending: Gastroenterology | Admitting: Gastroenterology

## 2012-07-23 ENCOUNTER — Encounter (HOSPITAL_COMMUNITY)
Admission: RE | Disposition: A | Discharge status: Home / Self Care | Payer: Self-pay | Source: Ambulatory Visit | Attending: Gastroenterology

## 2012-07-23 DIAGNOSIS — K222 Esophageal obstruction: Secondary | ICD-10-CM

## 2012-07-23 DIAGNOSIS — Z860100 Personal history of colon polyps, unspecified: Secondary | ICD-10-CM | POA: Insufficient documentation

## 2012-07-23 DIAGNOSIS — Q393 Congenital stenosis and stricture of esophagus: Secondary | ICD-10-CM

## 2012-07-23 DIAGNOSIS — M479 Spondylosis, unspecified: Secondary | ICD-10-CM | POA: Insufficient documentation

## 2012-07-23 DIAGNOSIS — K3189 Other diseases of stomach and duodenum: Secondary | ICD-10-CM | POA: Insufficient documentation

## 2012-07-23 DIAGNOSIS — Q391 Atresia of esophagus with tracheo-esophageal fistula: Secondary | ICD-10-CM | POA: Insufficient documentation

## 2012-07-23 DIAGNOSIS — K2960 Other gastritis without bleeding: Secondary | ICD-10-CM

## 2012-07-23 DIAGNOSIS — C50919 Malignant neoplasm of unspecified site of unspecified female breast: Secondary | ICD-10-CM | POA: Insufficient documentation

## 2012-07-23 DIAGNOSIS — K449 Diaphragmatic hernia without obstruction or gangrene: Secondary | ICD-10-CM | POA: Insufficient documentation

## 2012-07-23 DIAGNOSIS — Z79899 Other long term (current) drug therapy: Secondary | ICD-10-CM | POA: Insufficient documentation

## 2012-07-23 DIAGNOSIS — K219 Gastro-esophageal reflux disease without esophagitis: Secondary | ICD-10-CM | POA: Insufficient documentation

## 2012-07-23 DIAGNOSIS — I10 Essential (primary) hypertension: Secondary | ICD-10-CM | POA: Insufficient documentation

## 2012-07-23 DIAGNOSIS — Z923 Personal history of irradiation: Secondary | ICD-10-CM | POA: Insufficient documentation

## 2012-07-23 DIAGNOSIS — G4730 Sleep apnea, unspecified: Secondary | ICD-10-CM | POA: Insufficient documentation

## 2012-07-23 DIAGNOSIS — K589 Irritable bowel syndrome without diarrhea: Secondary | ICD-10-CM | POA: Insufficient documentation

## 2012-07-23 DIAGNOSIS — E119 Type 2 diabetes mellitus without complications: Secondary | ICD-10-CM | POA: Insufficient documentation

## 2012-07-23 DIAGNOSIS — Z853 Personal history of malignant neoplasm of breast: Secondary | ICD-10-CM | POA: Insufficient documentation

## 2012-07-23 DIAGNOSIS — Z880 Allergy status to penicillin: Secondary | ICD-10-CM | POA: Insufficient documentation

## 2012-07-23 DIAGNOSIS — Z9010 Acquired absence of unspecified breast and nipple: Secondary | ICD-10-CM | POA: Insufficient documentation

## 2012-07-23 HISTORY — PX: ESOPHAGOGASTRODUODENOSCOPY (EGD) WITH ESOPHAGEAL DILATION: SHX5812

## 2012-07-23 SURGERY — ESOPHAGOGASTRODUODENOSCOPY (EGD) WITH ESOPHAGEAL DILATION
Anesthesia: Moderate Sedation

## 2012-07-23 MED ORDER — MEPERIDINE HCL 100 MG/ML IJ SOLN
INTRAMUSCULAR | Status: AC
  Filled 2012-07-23: qty 1

## 2012-07-23 MED ORDER — MINERAL OIL PO OIL
TOPICAL_OIL | ORAL | Status: AC
  Filled 2012-07-23: qty 30

## 2012-07-23 MED ORDER — SODIUM CHLORIDE 0.9 % IV SOLN
INTRAVENOUS | Status: DC
  Administered 2012-07-23: 08:00:00 via INTRAVENOUS

## 2012-07-23 MED ORDER — STERILE WATER FOR IRRIGATION IR SOLN
Status: DC | PRN
  Administered 2012-07-23: 09:00:00

## 2012-07-23 MED ORDER — BUTAMBEN-TETRACAINE-BENZOCAINE 2-2-14 % EX AERO
INHALATION_SPRAY | CUTANEOUS | Status: DC | PRN
  Administered 2012-07-23: 2 via TOPICAL

## 2012-07-23 MED ORDER — MIDAZOLAM HCL 5 MG/5ML IJ SOLN
INTRAMUSCULAR | Status: AC
  Filled 2012-07-23: qty 10

## 2012-07-23 MED ORDER — MIDAZOLAM HCL 5 MG/5ML IJ SOLN
INTRAMUSCULAR | Status: DC | PRN
  Administered 2012-07-23: 1 mg via INTRAVENOUS
  Administered 2012-07-23 (×2): 2 mg via INTRAVENOUS
  Administered 2012-07-23: 1 mg via INTRAVENOUS

## 2012-07-23 MED ORDER — MEPERIDINE HCL 100 MG/ML IJ SOLN
INTRAMUSCULAR | Status: DC | PRN
  Administered 2012-07-23: 25 mg via INTRAVENOUS
  Administered 2012-07-23: 50 mg via INTRAVENOUS
  Administered 2012-07-23: 25 mg via INTRAVENOUS

## 2012-07-23 NOTE — Interval H&P Note (Signed)
History and Physical Interval Note:  07/23/2012 7:51 AM  Amy Houston  has presented today for surgery, with the diagnosis of Esophageal Stricture  The various methods of treatment have been discussed with the patient and family. After consideration of risks, benefits and other options for treatment, the patient has consented to  Procedure(s) with comments: ESOPHAGOGASTRODUODENOSCOPY (EGD) WITH ESOPHAGEAL DILATION (N/A) - 8:45 as a surgical intervention .  The patient's history has been reviewed, patient examined, no change in status, stable for surgery.  I have reviewed the patient's chart and labs.  Questions were answered to the patient's satisfaction.     [employer]

## 2012-07-23 NOTE — H&P (View-Only) (Signed)
Primary Care Physician:  Fredirick Maudlin, MD Primary Gastroenterologist:  Dr. Darrick Penna  Pre-Procedure History & Physical: HPI:  Amy Houston is a 67 y.o. female here for DYSPEPSIA/personal history of polyps/CHRONIC INTERMITTENT DIARRHEA. RECENT DIAGNOSIS OF BREAST CA.   Past Medical History  Diagnosis Date  . HTN (hypertension)   . Anxiety and depression   . GERD (gastroesophageal reflux disease)     tums  . IBS (irritable bowel syndrome)   . Hemorrhoid   . History of blood transfusion     post child birth  . PPD positive, treated 1999  . PONV (postoperative nausea and vomiting)   . Exertional shortness of breath   . Sleep apnea     "gone after I had OR" (05/08/2012)  . Diabetes mellitus 1999    "dx; never on meds" (05/08/2012)  . H/O hiatal hernia   . Arthritis     "of the spine" (05/08/2012)  . Anxiety   . Depression   . Breast cancer 1995    "left" (05/08/2012)  . Breast cancer, right breast, IDC, Multifocal 03/12/2012    Multifocal, 10 and 8 left breast     Past Surgical History  Procedure Laterality Date  . Tubal ligation  1976  . Mastectomy complete / simple w/ sentinel node biopsy Right 05/08/2012  . Mastectomy complete / simple Left 05/08/2012  . Uvulopalatopharyngoplasty, tonsillectomy and septoplasty  ~ 2008  . Cholecystectomy  1992  . Dilation and curettage of uterus  1968  . Tonsillectomy and adenoidectomy  ~ 2008  . Breast lumpectomy Left 1995  . Breast biopsy Left 1995  . Breast biopsy Bilateral 03/2012    "one on the left; 2 on the right" (05/08/2012)  . Simple mastectomy with axillary sentinel node biopsy Right 05/08/2012    Procedure: RIGHT TOTAL MASTECTOMY WITH AXILLARY SENTINEL NODE BIOPSY;  Surgeon: Currie Paris, MD;  Location: [facility] OR;  Service: General;  Laterality: Right;  . Simple mastectomy with axillary sentinel node biopsy Left 05/08/2012    Procedure: LEFT TOTAL MASTECTOMY;  Surgeon: Currie Paris, MD;  Location: [facility] OR;  Service: General;   Laterality: Left;  . Colonoscopy  12/06/93    Dr. Hayes:single diminutive polyp of the descending colon/extrernal hemorrhoids, benign path  . Colonoscopy  11/30/98    Dr Hayes:small rectal polyp/internal hemorrhoids, benign path  . Colonoscopy  11/26/2006    Dr. Hayes:sigmoid polyp/small interal hemorrhoids, adenomatous  . Esophagogastroduodenoscopy  1994    Dr. Matthias Hughs: small hiatal hernia and Schatzki's ring widely patent, CLO test negative    Prior to Admission medications   Medication Sig Start Date End Date Taking? Authorizing Provider  Calcium Carbonate-Vitamin D (CALCIUM 600+D3 PO) Take 1 tablet by mouth 2 (two) times daily.   Yes Historical Provider, MD  cholecalciferol (VITAMIN D) 1000 UNITS tablet Take 2,000 Units by mouth daily.   Yes Historical Provider, MD  exemestane (AROMASIN) 25 MG tablet Take 1 tablet (25 mg total) by mouth daily after breakfast. 06/03/12  Yes Randall An, MD  lisinopril (PRINIVIL,ZESTRIL) 20 MG tablet Take 20 mg by mouth daily.   Yes Historical Provider, MD  metoprolol succinate (TOPROL-XL) 25 MG 24 hr tablet Take 25 mg by mouth daily.   Yes Historical Provider, MD  Multiple Vitamin (MULTIVITAMIN WITH MINERALS) TABS Take 1 tablet by mouth daily.   Yes Historical Provider, MD  omeprazole (PRILOSEC) 20 MG capsule Take 1 capsule (20 mg total) by mouth daily. 06/03/12  Yes Nira Retort, NP  polyethylene glycol-electrolytes (TRILYTE) 420 G solution Take 4,000 mLs by mouth as directed. 06/03/12  Yes West Bali, MD  venlafaxine (EFFEXOR) 25 MG tablet Take 50 mg by mouth 2 (two) times daily.   Yes Historical Provider, MD  doxycycline (VIBRAMYCIN) 50 MG capsule Take 2 capsules (100 mg total) by mouth 2 (two) times daily. 06/21/12   Currie Paris, MD    Allergies as of 06/03/2012 - Review Complete 06/03/2012  Allergen Reaction Noted  . Penicillins Rash 08/18/2010    Family History  Problem Relation Age of Onset  . Arthritis    . Cancer    . Diabetes     . Lung cancer Mother 29    smoker  . Lymphoma Father   . Leukemia Father   . Lung cancer Sister 72  . Thyroid cancer Brother 17  . Lymphoma Brother 60  . Breast cancer Maternal Aunt     mother's paternal half sister  . Cancer Maternal Uncle     unknown type; mother's paternal half brother  . Lung cancer Maternal Grandmother   . Cancer Maternal Aunt     unknown cancer; mother's maternal half sister  . Colon cancer Neg Hx     History   Social History  . Marital Status: Married    Spouse Name: N/A    Number of Children: N/A  . Years of Education: 12th grade   Occupational History  . 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 Smoking status: Never Smoker   . Smokeless tobacco: Never Used  . Alcohol Use: Yes     Comment: 05/08/2012 "margarita or glass of wine once or twice/yr"  . Drug Use: No  . Sexually Active: No   Other Topics Concern  . Not on file   Social History Narrative  . No narrative on file    Review of Systems: See HPI, otherwise negative ROS   Physical Exam: BP 168/76  Pulse 79  Temp(Src) 98.6 F (37 C) (Oral)  Resp 18  SpO2 92% General:   Alert,  pleasant and cooperative in NAD Head:  Normocephalic and atraumatic. Neck:  Supple; Lungs:  Clear throughout to auscultation.    Heart:  Regular rate and rhythm. Abdomen:  Soft, nontender and nondistended. Normal bowel sounds, without guarding, and without rebound.   Neurologic:  Alert and  oriented x4;  grossly normal neurologically.  Impression/Plan:     DYSPEPSIA/personal history of polyps/CHRONIC INTERMITTENT DIARRHEA  PLAN:  TCS/EGD TODAY

## 2012-07-23 NOTE — Op Note (Signed)
[facility] [address] [place], [address]   ENDOSCOPY PROCEDURE REPORT  PATIENT: Amy Houston, Amy Houston  MR#: [id number] BIRTHDATE: 10/08/1945 , 67  yrs. old GENDER: Female  ENDOSCOPIST: Jonette Eva, MD REFFERED XB:JYNWGN Juanetta Gosling, M.D.  Drue Second, M.D.  PROCEDURE DATE:  07/23/2012 PROCEDURE:   EGD with biopsy and EGD with dilatation over guidewire   INDICATIONS:1.  dyspepsia.   2.  known web v.  stricture at UES. EGD/SAVARY DIL JUN 2014 TO 12 MM MEDICATIONS: Demerol 100 mg IV and Versed 6 mg IV TOPICAL ANESTHETIC: Cetacaine Spray  DESCRIPTION OF PROCEDURE:   After the risks benefits and alternatives of the procedure were thoroughly explained, informed consent was obtained.  The [id number] ([id number]) and [id number] ([id number])  endoscope was introduced through the mouth and advanced to the second portion of the duodenum. The instrument was slowly withdrawn as the mucosa was carefully examined.  Prior to withdrawal of the scope, the guidwire was placed.  The esophagus was dilated successfully.  The patient was recovered in endoscopy and discharged home in satisfactory condition.   ESOPHAGUS: An esophageal web was found in the upper third of the esophagus.   A Schatzki ring was found at the gastroesophageal junction and was widely open.   A small hiatal hernia was noted. STOMACH: Mild erosive gastritis (inflammation) was found in the gastric antrum.  Multiple biopsies were performed using cold forceps.   DUODENUM: The duodenal mucosa showed no abnormalities in the bulb and second portion of the duodenum.   Dilation was then performed at the proximal esophagus  Dilator: Savary over guidewire Size(s): 12.8-16 MM Resistance: moderate Heme: none  COMPLICATIONS: There were no complications.  ENDOSCOPIC IMPRESSION: 1.  PROXIMAL Esophageal web V. RADIATION-INDUCED STRICTURE 2.   Schatzki ring at the gastroesophageal junction 3.   Small hiatal hernia 4.   MILD  Erosive gastritis  RECOMMENDATIONS: CONTINUE OMEPRAZOLE once daily 30 MINUTES PRIOR TO BREAKFAST FOREVER. FOLLOW A HIGH FIBER/LOW FAT DIET.  AVOID ITEMS THAT CAUSE BLOATING.  BIOPSY WILL BE BACK IN 7 DAYS FOLLOW UP IN OCT 2014.      _______________________________ eSignedJonette Eva, MD 07/23/2012 2:21 PM

## 2012-07-24 ENCOUNTER — Encounter (HOSPITAL_COMMUNITY): Payer: Self-pay | Admitting: Gastroenterology

## 2012-07-30 NOTE — Telephone Encounter (Signed)
Cc PCP 

## 2012-07-30 NOTE — Telephone Encounter (Signed)
Please call pt. HER STOMACH BIOPSIES SHOW BENIGN STOMACH POLYPS.    CONTINUE OMEPRAZOLE once daily 30 MINUTES PRIOR TO BREAKFAST FOREVER.  FOLLOW A HIGH FIBER/LOW FAT DIET. AVOID ITEMS THAT CAUSE BLOATING.   OPV IN OCT 2014 E30 DIARRHEA, ESOPHAGEAL STRICTURE

## 2012-07-30 NOTE — Telephone Encounter (Signed)
Reminder in epic °

## 2012-07-31 NOTE — Telephone Encounter (Signed)
Called and informed pt.  

## 2012-08-05 ENCOUNTER — Encounter (INDEPENDENT_AMBULATORY_CARE_PROVIDER_SITE_OTHER): Payer: Self-pay | Admitting: Surgery

## 2012-08-05 ENCOUNTER — Ambulatory Visit (INDEPENDENT_AMBULATORY_CARE_PROVIDER_SITE_OTHER): Payer: Medicare Other | Admitting: Surgery

## 2012-08-05 VITALS — BP 126/72 | HR 66 | Temp 97.8°F | Resp 14 | Ht 60.0 in | Wt 167.4 lb

## 2012-08-05 DIAGNOSIS — Z9889 Other specified postprocedural states: Secondary | ICD-10-CM

## 2012-08-05 NOTE — Progress Notes (Signed)
Patient comes in for recheck status post bilateral mastectomies. She continues to have some serous drainage from the medial and of the incision on the left In addition, she has a thyroid nodule that was recently found and radiology recommended a FNA. Of note is that her brother had medullary thyroid cancer.  Exam: There is no residual fluid and all has healed well. There is some redness on the left inferior flap, but this is unchanged over the last several weeks  Impression: Bilateral postmastectomy seromas, resolved Thyroid c/w goiter Neg endo and colonoscopy for cancer  Plan RTCthree months

## 2012-08-05 NOTE — Patient Instructions (Signed)
See me again in three months 

## 2012-08-07 ENCOUNTER — Other Ambulatory Visit: Payer: Self-pay

## 2012-09-04 ENCOUNTER — Other Ambulatory Visit (INDEPENDENT_AMBULATORY_CARE_PROVIDER_SITE_OTHER): Payer: Self-pay | Admitting: *Deleted

## 2012-09-04 ENCOUNTER — Telehealth (INDEPENDENT_AMBULATORY_CARE_PROVIDER_SITE_OTHER): Payer: Self-pay | Admitting: General Surgery

## 2012-09-04 DIAGNOSIS — C50919 Malignant neoplasm of unspecified site of unspecified female breast: Secondary | ICD-10-CM

## 2012-09-04 MED ORDER — UNABLE TO FIND: Status: DC

## 2012-09-04 NOTE — Telephone Encounter (Signed)
Pt called to ask for prescription for bilateral breast prostheses and bras be FAXed to [facility]) at [phone], Attn:  Doris.  She has appt next week and they asked to have the prescription there in advance.

## 2012-09-26 ENCOUNTER — Ambulatory Visit (HOSPITAL_COMMUNITY): Admission: RE | Admit: 2012-09-26 | Payer: Medicare Other | Source: Ambulatory Visit | Admitting: Pulmonary Disease

## 2012-09-26 ENCOUNTER — Other Ambulatory Visit (HOSPITAL_COMMUNITY): Payer: Self-pay | Admitting: Pulmonary Disease

## 2012-09-26 ENCOUNTER — Ambulatory Visit (HOSPITAL_COMMUNITY)
Admission: EM | Admit: 2012-09-26 | Discharge: 2012-09-26 | Disposition: A | Discharge status: Home / Self Care | Payer: Medicare Other | Source: Ambulatory Visit | Attending: Pulmonary Disease | Admitting: Pulmonary Disease

## 2012-09-26 DIAGNOSIS — R52 Pain, unspecified: Secondary | ICD-10-CM

## 2012-09-26 DIAGNOSIS — M25569 Pain in unspecified knee: Secondary | ICD-10-CM | POA: Insufficient documentation

## 2012-10-01 ENCOUNTER — Telehealth: Payer: Self-pay | Admitting: Genetic Counselor

## 2012-10-01 NOTE — Telephone Encounter (Signed)
Wants to get her daughter's tested for the PALB2 and CHEK2 mutations that were found in her.  I gave her Lisa's name and phone number so that they can call and set up appointments.

## 2012-11-07 ENCOUNTER — Other Ambulatory Visit: Payer: Self-pay

## 2012-11-08 ENCOUNTER — Ambulatory Visit (INDEPENDENT_AMBULATORY_CARE_PROVIDER_SITE_OTHER): Payer: Medicare Other | Admitting: Surgery

## 2012-11-08 ENCOUNTER — Encounter (INDEPENDENT_AMBULATORY_CARE_PROVIDER_SITE_OTHER): Payer: Self-pay | Admitting: Surgery

## 2012-11-08 VITALS — BP 130/86 | HR 86 | Resp 18 | Ht 60.0 in | Wt 168.0 lb

## 2012-11-08 DIAGNOSIS — Z853 Personal history of malignant neoplasm of breast: Secondary | ICD-10-CM

## 2012-11-08 NOTE — Progress Notes (Signed)
NAME: Amy Houston       DOB: 18-Jan-1945           DATE: 11/08/2012        MRN: [id number]  CC:   Chief Complaint  Patient presents with  . Breast Cancer Long Term Follow Up    Amy Houston is a 67 y.o.Marland Kitchenfemale who presents for routine followup of her Left breast cancer and right breast cancer diagnosed in 1995 and 2014 respectively and treated with initially a left lumpectomy in 1995, and bilateral mastectomies in 2014. She has no problems or concerns on either side.of note is that she has genetic abnormality predisposing to breast and pancreatic cancer.  PFSH: She has had no significant changes since the last visit here.  ROS: There have been no significant changes since the last visit here  EXAM:  VS: BP 130/86  Pulse 86  Resp 18  Ht 5' (1.524 m)  Wt 168 lb (76.204 kg)  BMI 32.81 kg/m2  General: The patient is alert, oriented, generally healthy appearing, NAD. Mood and affect are normal.  Breasts:  2 status post bilateral mastectomies. MSIR healed nicely. There are post radiation changes on the left from the radiation done in 1995. There is no evidence of local recurrence  Lymphatics: She has no axillary or supraclavicular adenopathy on either side.  Extremities: Full ROM of the surgical side with no lymphedema noted.  Data Reviewed: No new data  Impression: Doing well, with no evidence of recurrent cancer or new cancer  Plan: Will see here PRN as she is followe in [place]. Discussed with her what to look for in terms of a local recurrence

## 2012-11-08 NOTE — Patient Instructions (Signed)
Continue follow up with your primary physician and medical oncologist. We will see you again on an as needed basis. Please call the office at [phone] if you have any questions or concerns. Thank you for allowing [place] to take care of you.

## 2012-12-03 ENCOUNTER — Ambulatory Visit (HOSPITAL_COMMUNITY): Payer: Medicare Other | Admitting: Oncology

## 2012-12-10 ENCOUNTER — Ambulatory Visit (HOSPITAL_COMMUNITY)
Admission: RE | Admit: 2012-12-10 | Discharge: 2012-12-10 | Disposition: A | Discharge status: Home / Self Care | Payer: Medicare Other | Source: Ambulatory Visit | Attending: Oncology | Admitting: Oncology

## 2012-12-10 DIAGNOSIS — Z9010 Acquired absence of unspecified breast and nipple: Secondary | ICD-10-CM | POA: Insufficient documentation

## 2012-12-10 DIAGNOSIS — Z09 Encounter for follow-up examination after completed treatment for conditions other than malignant neoplasm: Secondary | ICD-10-CM | POA: Insufficient documentation

## 2012-12-10 DIAGNOSIS — R911 Solitary pulmonary nodule: Secondary | ICD-10-CM | POA: Insufficient documentation

## 2012-12-10 DIAGNOSIS — Z853 Personal history of malignant neoplasm of breast: Secondary | ICD-10-CM | POA: Insufficient documentation

## 2012-12-10 DIAGNOSIS — C50911 Malignant neoplasm of unspecified site of right female breast: Secondary | ICD-10-CM

## 2012-12-11 ENCOUNTER — Encounter (HOSPITAL_COMMUNITY): Payer: Self-pay | Admitting: Oncology

## 2012-12-11 DIAGNOSIS — M8580 Other specified disorders of bone density and structure, unspecified site: Secondary | ICD-10-CM

## 2012-12-11 HISTORY — DX: Other specified disorders of bone density and structure, unspecified site: M85.80

## 2012-12-11 NOTE — Progress Notes (Signed)
Amy Maudlin, MD [address] [place] [address]  Breast cancer, right breast, IDC, Multifocal - Plan: CBC with Differential, Comprehensive metabolic panel, CBC with Differential, Comprehensive metabolic panel, CT Chest Wo Contrast, DG Bone Density, CBC with Differential, Comprehensive metabolic panel  Hx Left breast cancer, UOQ, IDC, receptor negative - Plan: CT Chest Wo Contrast  Osteopenia  Pulmonary nodules - Plan: CT Chest Wo Contrast  CURRENT THERAPY: Aromasin daily beginning on 06/03/2012 and she will take this lifelong due to increased recurrence rate with PALB2 and CHEK2 positivity.  INTERVAL HISTORY: Amy Houston 67 y.o. female returns for  regular  visit for followup of  right sided breast cancer,GRADE II, 1.2 cm invasive ductal carcinoma, ER +99%, PR +72%, grade 2, LV I positive, Ki-67 marker high at 60%, but 4 sentinel lymph nodes negative for metastatic disease. HER-2/neu nonamplified.  S/P Bilateral mastectomy on 05/08/2012 by Dr. Cicero Duck.  Her Oncotype DX score was 14 giving her recurrence rate of 9% at 5 years of hormonal therapy. With that in mind she was not considered a candidate for sytemic chemotherapy. Now on Aromasin beginning on 06/03/2012 and she will take this lifelong due to her PALB2  And CHEK 2 positivity.   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 followed by radiation therapy. She was not given tamoxifen. There was some question of Megace one tablet a day but she is not sure of the duration. This may have been for hot flashes which she remembers were severe. She was eventually placed on Effexor for hot flashes but is now on the Effexor for depression.  We spent time discussing the implications of her PALB2 and CHEK2 mutation positivity.  She is well educated from her  appointment with the [facility].  According to Maylon Cos, genetics counselor: We reviewed the natural history and genetic etiology of sporadic, familial and hereditary cancer syndromes. PALB2 and CHEK2 are moderate risk genes. Her mutation in CHEK2 is a common deletion called c.1100del, which is typically seen in the [place] and [place] european population. We reviewed the risks for cancer based on these mutations. We discussed that CHEK2 is associated with an increased risk for breast cancer in both men (~1%) and women (~20-48%), as well as prostate (24-44%) and colon cancer (up to ~10%). PALB2 mutations increase the risk for breast cancer (25-40%), as well as prostate (not quantified), ovarian (not quantified) and pancreatic cancers (not quantified). Neither of these genes has clinical guidelines, so we would follow her based on the general risk for cancer and our experience following for other syndromes. We discussed that having two deleterious mutations, also called double heterozygote, is uncommon. There is not a lot of information about the risks associated with this state. Most of the studies involving double heterozygosity involves the BRCA genes, or a BRCA gene with a moderate risk gene such as CHEK2 or PALB2. These studies have found that there is not an increased risk in severity for breast cancer specifically, more than that of having one mutation. We discussed that there are no studies of individuals with PALB2 and CHEK2 mutations to know if this risk is similar. Based on the literature, we would follow [name], in regards to her breast cancer risk, based on the CHEK2 mutation, as that convey's the greatest risk. Since [name] has  had a double mastectomy, she has reduced her risk for breast cancer to the greatest extent.   We also discussed the risk for other family members. Each of her siblings and daughters has a 50% chance of having a CHEK2 mutation and a 50% chance of  having a PALB2 mutation. We also discussed that they each have an increased risk (about a 25% chance) of also being a double heterozygote for these mutations. We reviewed the medical management changes that we would recommend, and that genetic testing would involve a blood test to look for the specific mutations found in the patient, although it would not be performed at the same lab ([facility]) as they do not do single site testing of these two genes.. They voiced understanding of this information.   Per the patient's request, we will contact her by telephone to discuss further testing of other family members once we have found a lab who will do single site testing. A follow up genetic counseling visit will be scheduled if indicated.  I personally reviewed and went over laboratory results with the patient.  These are outdated from June 2014 and therefore we will up date her labs today with CBC diff, CMET today.  I personally reviewed and went over radiographic studies with the patient.  CT scan of chest is benign and does note benign appearing pulmonary nodules.  These will need follow-up in the future.  We reviewed her CT scan report in detail.    She reports that her oldest daughter who is 24 yo tested positive for one of the genetic mutations that [name] has (either PALB2 or CHEK2).  Her oldest daughter has an upcoming appointment with Dr. Welton Flakes (Hem/Onc) to discuss this finding and develop a surveillance program.    Oncologically, she denies any complaints and ROS questioning is negative.   Past Medical History  Diagnosis Date  . HTN (hypertension)   . Anxiety and depression   . GERD (gastroesophageal reflux disease)     tums  . IBS (irritable bowel syndrome)   . Hemorrhoid   . History of blood transfusion     post child birth  . PPD positive, treated 1999  . PONV (postoperative nausea and vomiting)   . Exertional shortness of breath   . Sleep apnea     "gone after I had OR"  (05/08/2012)  . Diabetes mellitus 1999    "dx; never on meds" (05/08/2012)  . H/O hiatal hernia   . Arthritis     "of the spine" (05/08/2012)  . Anxiety   . Depression   . Breast cancer 1995    "left" (05/08/2012)  . Breast cancer, right breast, IDC, Multifocal 03/12/2012    Multifocal, 10 and 8 left breast   . Osteopenia 12/11/2012    On Ca++ and Vit D. Bone density in April 2014.  Next bone density due in April 2016.    has Popliteal cyst; Breast cancer, right breast, IDC, Multifocal; Hx Left breast cancer, UOQ, IDC, receptor negative; GERD (gastroesophageal reflux disease); Adenomatous polyps; Thyroid nodule; and Osteopenia on her problem list.     is allergic to penicillins.  Ms. Mednick does not currently have medications on file.  Past Surgical History  Procedure Laterality Date  . Tubal ligation  1976  . Mastectomy complete / simple w/ sentinel node biopsy Right 05/08/2012  . Mastectomy complete / simple Left 05/08/2012  . Uvulopalatopharyngoplasty, tonsillectomy and septoplasty  ~ 2008  . Cholecystectomy  1992  . Dilation  and curettage of uterus  1968  . Tonsillectomy and adenoidectomy  ~ 2008  . Breast lumpectomy Left 1995  . Breast biopsy Left 1995  . Breast biopsy Bilateral 03/2012    "one on the left; 2 on the right" (05/08/2012)  . Simple mastectomy with axillary sentinel node biopsy Right 05/08/2012    Procedure: RIGHT TOTAL MASTECTOMY WITH AXILLARY SENTINEL NODE BIOPSY;  Surgeon: Currie Paris, MD;  Location: [facility] OR;  Service: General;  Laterality: Right;  . Simple mastectomy with axillary sentinel node biopsy Left 05/08/2012    Procedure: LEFT TOTAL MASTECTOMY;  Surgeon: Currie Paris, MD;  Location: [facility] OR;  Service: General;  Laterality: Left;  . Colonoscopy  12/06/93    Dr. Hayes:single diminutive polyp of the descending colon/extrernal hemorrhoids, benign path  . Colonoscopy  11/30/98    Dr Hayes:small rectal polyp/internal hemorrhoids, benign path  . Colonoscopy   11/26/2006    Dr. Hayes:sigmoid polyp/small interal hemorrhoids, adenomatous  . Esophagogastroduodenoscopy  1994    Dr. Matthias Hughs: small hiatal hernia and Schatzki's ring widely patent, CLO test negative  . Colonoscopy with esophagogastroduodenoscopy (egd) N/A 07/01/2012    Procedure: COLONOSCOPY WITH ESOPHAGOGASTRODUODENOSCOPY (EGD);  Surgeon: West Bali, MD;  Location: [facility] END[facility] SUITE;  Service: Endoscopy;  Laterality: N/A;  12:30-rescheduled to 1130 Leigh Ann notified pt  . Esophageal dilation  07/01/2012    Procedure: ESOPHAGEAL DILATION;  Surgeon: West Bali, MD;  Location: [facility];  Service: Endoscopy;;  . Esophagogastroduodenoscopy (egd) with esophageal dilation N/A 07/23/2012    Procedure: ESOPHAGOGASTRODUODENOSCOPY (EGD) WITH ESOPHAGEAL DILATION;  Surgeon: West Bali, MD;  Location: [facility];  Service: Endoscopy;  Laterality: N/A;  8:45    Denies any headaches, dizziness, double vision, fevers, chills, night sweats, nausea, vomiting, diarrhea, constipation, chest pain, heart palpitations, shortness of breath, blood in stool, black tarry stool, urinary pain, urinary burning, urinary frequency, hematuria.   PHYSICAL EXAMINATION  ECOG PERFORMANCE STATUS: 0 - Asymptomatic  Filed Vitals:   12/12/12 1410  BP: 137/74  Pulse: 86  Temp: 97.3 F (36.3 C)  Resp: 18    GENERAL:alert, healthy, no distress, well nourished, well developed, comfortable, cooperative, obese and smiling SKIN: skin color, texture, turgor are normal, no rashes or significant lesions HEAD: Normocephalic, No masses, lesions, tenderness or abnormalities EYES: normal, PERRLA, EOMI, Conjunctiva are pink and non-injected EARS: External ears normal OROPHARYNX:mucous membranes are moist  NECK: supple, no adenopathy, thyroid normal size, non-tender, without nodularity, no stridor, non-tender, trachea midline LYMPH:  no palpable lymphadenopathy, no hepatosplenomegaly BREAST:B/L post-mastectomy site well  healed and free of suspicious changes LUNGS: clear to auscultation and percussion HEART: regular rate & rhythm, no gallops, S1 normal, S2 normal and systolic ejection murmur noted throughout the precordium ABDOMEN:abdomen soft, non-tender, obese, normal bowel sounds, no masses or organomegaly and no hepatosplenomegaly BACK: Back symmetric, no curvature., No CVA tenderness EXTREMITIES:less then 2 second capillary refill, no joint deformities, effusion, or inflammation, no edema, no skin discoloration, no clubbing, no cyanosis  NEURO: alert & oriented x 3 with fluent speech, no focal motor/sensory deficits, gait normal    LABORATORY DATA: CBC    Component Value Date/Time   WBC 9.2 06/07/2012 1013   RBC 3.75* 06/07/2012 1013   HGB 11.8* 06/07/2012 1013   HCT 35.8* 06/07/2012 1013   PLT 351 06/07/2012 1013   MCV 95.5 06/07/2012 1013   MCH 31.5 06/07/2012 1013   MCHC 33.0 06/07/2012 1013   RDW 12.6 06/07/2012 1013  LYMPHSABS 3.4 06/07/2012 1013   MONOABS 0.6 06/07/2012 1013   EOSABS 0.1 06/07/2012 1013   BASOSABS 0.0 06/07/2012 1013      Chemistry      Component Value Date/Time   NA 137 06/07/2012 1013   K 4.3 06/07/2012 1013   CL 100 06/07/2012 1013   CO2 24 06/07/2012 1013   BUN 23 06/07/2012 1013   CREATININE 1.07 06/07/2012 1013      Component Value Date/Time   CALCIUM 9.7 06/07/2012 1013   ALKPHOS 58 06/07/2012 1013   AST 17 06/07/2012 1013   ALT 18 06/07/2012 1013   BILITOT 0.3 06/07/2012 1013        RADIOGRAPHIC STUDIES:  12/11/2012  CLINICAL DATA: Follow up pulmonary nodule. History of right breast  cancer with double mastectomy.  EXAM:  CT CHEST WITHOUT CONTRAST  TECHNIQUE:  Multidetector CT imaging of the chest was performed following the  standard protocol without IV contrast.  COMPARISON: Chest CT 06/10/2012 and 12/28/2005. Bone scan  06/12/2012.  FINDINGS:  As evaluated in the noncontrast state, the mediastinum has a stable  appearance. No pathologically enlarged mediastinal, hilar or    internal mammary lymph nodes are identified. There is coronary  artery atherosclerosis.  Postsurgical changes and fluid collections in both axilla have  improved. There is no adenopathy or focal soft tissue mass.  There is no pleural or pericardial effusion. The 2 mm subpleural  nodule in the right upper lobe on image 19 is stable. The  triangular-shaped nodular density more anteriorly in the right upper  lobe on images 17 and 18 is also stable. There is a focal  ground-glass opacity measuring 6 mm in the right upper lobe on image  16. There is a 3 mm left upper lobe nodule on image 11. No new or  enlarging pulmonary nodules are identified.  The visualized upper abdomen is notable for moderate hepatic  steatosis and prior cholecystectomy. There is no adrenal mass. There  are no worrisome osseous findings.  IMPRESSION:  1. Interval resolution of fluid collections in both axilla. No chest  wall mass or axillary adenopathy.  2. Stable small pulmonary nodules bilaterally. These do not appear  significantly changed from the original study and are felt to be  benign.  3. No new findings demonstrated.  Electronically Signed  By: Roxy Horseman M.D.  On: 12/10/2012 16:09      ASSESSMENT:  1. Right sided breast cancer,GRADE II, 1.2 cm invasive ductal carcinoma, ER +99%, PR +72%, grade 2, LV I positive, Ki-67 marker high at 60%, but 4 sentinel lymph nodes negative for metastatic disease. HER-2/neu nonamplified.  S/P Bilateral mastectomy on 05/08/2012 by Dr. Cicero Duck.  Her Oncotype DX score was 14 giving her recurrence rate of 9% at 5 years of hormonal therapy. With that in mind she was not considered a candidate for sytemic chemotherapy. Now on Aromasin beginning on 06/03/2012 and she will take this lifelong due to her PALB2  And CHEK 2 positivity.   2. PALB2 + mutation 3. CHEK2 + mutation 4. left-sided breast cancer 1996, treated with lumpectomy followed by chemotherapy consisting of CMF for 8  cycles. followed by radiation therapy. She was not given tamoxifen. There was some question of Megace one tablet a day but she is not sure of the duration. This may have been for hot flashes which she remembers were severe. She was eventually placed on Effexor for hot flashes but is now on the Effexor for depression. 5. Excess weight  6. History of a popliteal cyst  7. Vitamin D deficiency on replacement 8. Osteopenia, next bone density due in April 2016.  On Ca++ and Vit D.  Patient Active Problem List   Diagnosis Date Noted  . Osteopenia 12/11/2012  . Thyroid nodule 06/21/2012  . GERD (gastroesophageal reflux disease) 06/03/2012  . Adenomatous polyps 06/03/2012  . Breast cancer, right breast, IDC, Multifocal 03/12/2012  . Popliteal cyst 08/18/2010  . Hx Left breast cancer, UOQ, IDC, receptor negative 11/08/1993     PLAN:  1. I personally reviewed and went over laboratory results with the patient. 2. I personally reviewed and went over radiographic studies with the patient. 3. Labs today: CBC diff, CMET 4. Reviewed NCCN guidelines pertaining to surveillance.  5. Bone density due in April 2016.  Order placed.  6. Continue Aromasin daily.  She will require this lifelong due to increased risk of recurrence with PALB2 and CHEK2 mutations  7. Agree with Total Hysterectomy in the future 8. CT of chest wo contrast in 12 months 9. Return in 6 months for follow-up.   THERAPY PLAN:  Encouraged compliance with Aromasin which we recommend at this time she continue lifelong due to PALB2 and CHEK2 positivity.  We will follow NCCN guidelines pertaining to surveillance.  NCCN guidelines recommends the following surveillance for invasive breast cancer:  A. History and Physical exam every 4-6 months for 5 years and then every 12 months.  B. Mammography every 12 months  C. Women on Tamoxifen: annual gynecologic assessment every 12 months if uterus is present.  D. Women on aromatase inhibitor or who  experience ovarian failure secondary to treatment should have monitoring of bone health with a bone mineral density determination at baseline and periodically thereafter.  E. Assess and encourage adherence to adjuvant endocrine therapy.  F. Evidence suggests that active lifestyle and achieving and maintaining an ideal body weight (20-25 BMI) may lead to optimal breast cancer outcomes.  All questions were answered. The patient knows to call the clinic with any problems, questions or concerns. We can certainly see the patient much sooner if necessary.  Patient and plan discussed with Dr. Alla German and he is in agreement with the aforementioned.   I spent 30 minutes counseling the patient face to face. The total time spent in the appointment was 40 minutes.  KEFALAS,THOMAS

## 2012-12-12 ENCOUNTER — Encounter (HOSPITAL_COMMUNITY): Payer: Self-pay | Admitting: Oncology

## 2012-12-12 ENCOUNTER — Encounter (HOSPITAL_COMMUNITY): Payer: Medicare Other | Attending: Oncology | Admitting: Oncology

## 2012-12-12 VITALS — BP 137/74 | HR 86 | Temp 97.3°F | Resp 18

## 2012-12-12 DIAGNOSIS — M899 Disorder of bone, unspecified: Secondary | ICD-10-CM

## 2012-12-12 DIAGNOSIS — C50419 Malignant neoplasm of upper-outer quadrant of unspecified female breast: Secondary | ICD-10-CM

## 2012-12-12 DIAGNOSIS — E559 Vitamin D deficiency, unspecified: Secondary | ICD-10-CM

## 2012-12-12 DIAGNOSIS — R918 Other nonspecific abnormal finding of lung field: Secondary | ICD-10-CM

## 2012-12-12 DIAGNOSIS — Z170 Estrogen receptor positive status [ER+]: Secondary | ICD-10-CM

## 2012-12-12 DIAGNOSIS — Z853 Personal history of malignant neoplasm of breast: Secondary | ICD-10-CM | POA: Insufficient documentation

## 2012-12-12 DIAGNOSIS — M8580 Other specified disorders of bone density and structure, unspecified site: Secondary | ICD-10-CM

## 2012-12-12 DIAGNOSIS — C50911 Malignant neoplasm of unspecified site of right female breast: Secondary | ICD-10-CM

## 2012-12-12 DIAGNOSIS — Z09 Encounter for follow-up examination after completed treatment for conditions other than malignant neoplasm: Secondary | ICD-10-CM | POA: Insufficient documentation

## 2012-12-12 LAB — COMPREHENSIVE METABOLIC PANEL
ALT: 26 U/L (ref 0–35)
Alkaline Phosphatase: 62 U/L (ref 39–117)
CO2: 22 mEq/L (ref 19–32)
Calcium: 10.1 mg/dL (ref 8.4–10.5)
GFR calc Af Amer: 46 mL/min — ABNORMAL LOW (ref >90)
GFR calc non Af Amer: 40 mL/min — ABNORMAL LOW (ref >90)
Glucose, Bld: 135 mg/dL — ABNORMAL HIGH (ref 70–99)
Sodium: 139 mEq/L (ref 135–145)

## 2012-12-12 LAB — CBC WITH DIFFERENTIAL/PLATELET
Eosinophils Relative: 1 % (ref 0–5)
HCT: 37.5 % (ref 36.0–46.0)
Hemoglobin: 12.2 g/dL (ref 12.0–15.0)
Lymphocytes Relative: 32 % (ref 12–46)
Lymphs Abs: 2.4 10*3/uL (ref 0.7–4.0)
MCV: 94.9 fL (ref 78.0–100.0)
Monocytes Relative: 6 % (ref 3–12)
Platelets: 337 10*3/uL (ref 150–400)
RBC: 3.95 MIL/uL (ref 3.87–5.11)
WBC: 7.3 10*3/uL (ref 4.0–10.5)

## 2012-12-12 NOTE — Patient Instructions (Addendum)
.  [facility] Discharge Instructions  RECOMMENDATIONS MADE BY THE CONSULTANT AND ANY TEST RESULTS WILL BE SENT TO YOUR REFERRING PHYSICIAN.  EXAM FINDINGS BY THE PHYSICIAN TODAY AND SIGNS OR SYMPTOMS TO REPORT TO CLINIC OR PRIMARY PHYSICIAN:  Exam and findings as discussed by T.Kefalas PA.   INSTRUCTIONS/FOLLOW-UP: Lab work today. Bone density in April 2016 CT of chest in 12 months We support your decision to move forward with a total hysterectomy. Continue Aromasin daily.  At this time, we would recommend lifelong Aromasin due to PALB2 and CHEK2 mutation findings. Continue to be an advocate for your daughters as you are doing. Return in 6 months for follow-up.  Thank you for choosing [facility] to provide your oncology and hematology care.  To afford each patient quality time with our providers, please arrive at least 15 minutes before your scheduled appointment time.  With your help, our goal is to use those 15 minutes to complete the necessary work-up to ensure our physicians have the information they need to help with your evaluation and healthcare recommendations.    Effective January 1st, 2014, we ask that you re-schedule your appointment with our physicians should you arrive 10 or more minutes late for your appointment.  We strive to give you quality time with our providers, and arriving late affects you and other patients whose appointments are after yours.    Again, thank you for choosing [facility].  Our hope is that these requests will decrease the amount of time that you wait before being seen by our physicians.       _____________________________________________________________  Should you have questions after your visit to [facility], please contact our office at [phone] between the hours of 8:30 a.m. and 5:00 p.m.  Voicemails left after 4:30 p.m. will not be returned until the following business day.  For  prescription refill requests, have your pharmacy contact our office with your prescription refill request.

## 2012-12-12 NOTE — Progress Notes (Signed)
Amy Houston presented for labwork. Labs per MD order drawn via Peripheral Line 23 gauge needle inserted in rt ac.  Good blood return present. Procedure without incident.  Needle removed intact. Patient tolerated procedure well.

## 2012-12-17 ENCOUNTER — Encounter: Payer: Self-pay | Admitting: Gastroenterology

## 2012-12-24 ENCOUNTER — Other Ambulatory Visit (HOSPITAL_COMMUNITY): Payer: Medicare Other

## 2012-12-30 ENCOUNTER — Ambulatory Visit (HOSPITAL_COMMUNITY): Payer: Medicare Other | Admitting: Oncology

## 2013-01-03 NOTE — Progress Notes (Signed)
REVIEWED. Results TCS/EGD/DIL JUL 2014 SIMPLE ADENOMA(1), NL COLON/DUO bX   EGD/DIL JUL 2014 AGSTRITIS, FUNDIC GLAND POLYPS

## 2013-01-20 ENCOUNTER — Encounter (HOSPITAL_COMMUNITY): Payer: Self-pay | Admitting: Pharmacist

## 2013-01-22 ENCOUNTER — Other Ambulatory Visit: Payer: Self-pay | Admitting: Obstetrics and Gynecology

## 2013-01-23 ENCOUNTER — Other Ambulatory Visit: Payer: Self-pay | Admitting: Obstetrics and Gynecology

## 2013-01-29 ENCOUNTER — Encounter (HOSPITAL_COMMUNITY): Payer: Self-pay

## 2013-01-29 ENCOUNTER — Encounter (HOSPITAL_COMMUNITY)
Admission: RE | Admit: 2013-01-29 | Discharge: 2013-01-29 | Disposition: A | Discharge status: Home / Self Care | Payer: Medicare Other | Source: Ambulatory Visit | Attending: Obstetrics and Gynecology | Admitting: Obstetrics and Gynecology

## 2013-01-29 DIAGNOSIS — K219 Gastro-esophageal reflux disease without esophagitis: Secondary | ICD-10-CM | POA: Diagnosis not present

## 2013-01-29 DIAGNOSIS — E119 Type 2 diabetes mellitus without complications: Secondary | ICD-10-CM | POA: Diagnosis not present

## 2013-01-29 DIAGNOSIS — Z853 Personal history of malignant neoplasm of breast: Secondary | ICD-10-CM | POA: Diagnosis not present

## 2013-01-29 DIAGNOSIS — G4730 Sleep apnea, unspecified: Secondary | ICD-10-CM | POA: Diagnosis not present

## 2013-01-29 DIAGNOSIS — Z1589 Genetic susceptibility to other disease: Secondary | ICD-10-CM | POA: Diagnosis not present

## 2013-01-29 DIAGNOSIS — Z4002 Encounter for prophylactic removal of ovary: Secondary | ICD-10-CM | POA: Diagnosis not present

## 2013-01-29 DIAGNOSIS — I10 Essential (primary) hypertension: Secondary | ICD-10-CM | POA: Diagnosis not present

## 2013-01-29 LAB — CBC WITH DIFFERENTIAL/PLATELET
BASOS PCT: 0 % (ref 0–1)
Basophils Absolute: 0 10*3/uL (ref 0.0–0.1)
EOS ABS: 0.1 10*3/uL (ref 0.0–0.7)
Eosinophils Relative: 1 % (ref 0–5)
HEMATOCRIT: 36.3 % (ref 36.0–46.0)
Hemoglobin: 12.2 g/dL (ref 12.0–15.0)
Lymphocytes Relative: 38 % (ref 12–46)
Lymphs Abs: 3 10*3/uL (ref 0.7–4.0)
MCH: 30.7 pg (ref 26.0–34.0)
MCHC: 33.6 g/dL (ref 30.0–36.0)
MCV: 91.4 fL (ref 78.0–100.0)
MONO ABS: 0.4 10*3/uL (ref 0.1–1.0)
MONOS PCT: 5 % (ref 3–12)
NEUTROS ABS: 4.5 10*3/uL (ref 1.7–7.7)
Neutrophils Relative %: 56 % (ref 43–77)
Platelets: 292 10*3/uL (ref 150–400)
RBC: 3.97 MIL/uL (ref 3.87–5.11)
RDW: 12.4 % (ref 11.5–15.5)
WBC: 8 10*3/uL (ref 4.0–10.5)

## 2013-01-29 LAB — COMPREHENSIVE METABOLIC PANEL
ALT: 27 U/L (ref 0–35)
AST: 24 U/L (ref 0–37)
Albumin: 4.2 g/dL (ref 3.5–5.2)
Alkaline Phosphatase: 63 U/L (ref 39–117)
BUN: 17 mg/dL (ref 6–23)
CO2: 26 mEq/L (ref 19–32)
CREATININE: 0.94 mg/dL (ref 0.50–1.10)
Calcium: 10 mg/dL (ref 8.4–10.5)
Chloride: 101 mEq/L (ref 96–112)
GFR calc Af Amer: 72 mL/min — ABNORMAL LOW (ref >90)
GFR calc non Af Amer: 62 mL/min — ABNORMAL LOW (ref >90)
Glucose, Bld: 165 mg/dL — ABNORMAL HIGH (ref 70–99)
Potassium: 4.1 mEq/L (ref 3.7–5.3)
SODIUM: 141 meq/L (ref 137–147)
TOTAL PROTEIN: 7.5 g/dL (ref 6.0–8.3)
Total Bilirubin: 0.4 mg/dL (ref 0.3–1.2)

## 2013-01-29 MED ORDER — DEXTROSE 5 % IV SOLN
INTRAVENOUS | Status: AC
  Administered 2013-01-30: 90 mL via INTRAVENOUS
  Filled 2013-01-29: qty 2.25

## 2013-01-29 NOTE — H&P (Signed)
NAMESKYELER, SCALESE NO.:  [id number]  MEDICAL RECORD NO.:  [id number]  LOCATION:  [place]                           FACILITY:  [place]  PHYSICIAN:  Lucille Passy. Ulanda Edison, M.D. DATE OF BIRTH:  1945/01/08  DATE OF ADMISSION:  01/30/2013 DATE OF DISCHARGE:                             HISTORY & PHYSICAL   HISTORY OF PRESENT ILLNESS:  This is a 68 year old white female, para 2- 0-1-2, who has a genetic abnormality that predisposes her to ovarian cancer and is admitted for abdominal hysterectomy and bilateral salpingo- oophorectomy.  The patient is having no problems at the present time. During her evaluation for why she had bilateral breast cancer.  She was found to have a genetic mutation that placed her at increased risk for ovarian cancer.  I spoke to Dr. Marti Sleigh at the [facility] and he referred me to Dr. Nancy Marus at the [place] [facility] and she said that the patient should have bilateral oophorectomy.  The patient herself wants removal of the uterus, so at the time of removing the ovaries, we will remove the uterus.  She is having no postmenopausal bleeding.  She has recently undergone bilateral mastectomy.  PAST MEDICAL HISTORY:  Reveals left breast cancer in 1996, right breast cancer in 2014.  She does have a history of high blood pressure and diabetes, but has never been on medication for the diabetes.  She has a history of hemorrhoids and irritable bowel syndrome and hiatus hernia. She has GERD.  Does have sleep apnea and osteopenia.  She has had history of depression.  The genetic mutation is she is positive for CAG K2 and PALB2 mutation.  PAST SURGICAL HISTORY:  She has had the tonsillectomy and adenoidectomy. Bilateral mastectomy in 2014, lumpectomy in 1995, cholecystectomy in 1992, tubal ligation in 1976 and D and C in 1968.  ALLERGIES:  Penicillin causes severe rash.  SOCIAL HISTORY:  She is never a smoker.   Drinks alcohol occasionally. Has 12 years of education.  She works as a Research scientist (physical sciences) at [employer].  She is married.  FAMILY HISTORY:  Mother with cancer of the lung, onset age 24.  Father with lymphoma at age 67 leukemia.  Sister malignancy of the lung died at age 42.  Brother malignant tumor of the thyroid gland, deceased at age 55.  Another brother had a lymphoma died at age 13.  Maternal aunt with malignant tumor of the breast, malignant tumor of  the colon.  Maternal grandmother with tumor of the lung.  Paternal uncle with tumor of the oral cavity.  Maternal uncle tumor of the lung.  Patient's daughter has a leiomyoma of the esophagus.  MEDICATIONS:  Exemestane 25 mg, lisinopril 20 mg, metoprolol succinate ER 25 mg, omeprazole 20 mg, venlafaxine 25 mg.  PHYSICAL EXAMINATION:  GENERAL:  Well developed, well nourished, somewhat obese white female, in no distress. VITAL SIGNS:  Weight is 169 pounds.  Height 5 feet, blood pressure 138/70, pulse 80. HEAD, EYES, NOSE, AND THROAT:  Normal.  The mouth is somewhat narrowed. NECK:  Supple without thyromegaly. LUNGS:  Clear to auscultation. HEART:  Normal size and sounds.  No murmurs.  ABDOMEN:  Soft, slightly obese, nontender.  No masses are palpable. PELVIC:  Quite difficult.  The patient screams from pain with insertion of only a small speculum.  The cervix is seen with difficulty.  The vagina is atrophic.  The uterus is difficult to feel.  The adnexa are clear.  ADMITTING IMPRESSION:  Genetic mutation predisposing her to ovarian cancer.  History of hypertension, diabetes, gastroesophageal reflux disease, and osteopenia.  The patient is admitted for hysterectomy and removal of tubes and ovaries.  I had initially thought I would do an LAVH and BSO, but after examining the patient's atrophic constricted vagina which has been contributed to by the fact that she has not been able to take hormone therapy.  She has not been sexually  active.  I probably will do abdominal hysterectomy and bilateral salpingo- oophorectomy.  The patient has been counseled and she understands and agrees to this.  She has also been counseled about the risks of surgery including, but not limited to heart attack, stroke, pulmonary embolus, wound disruption, hemorrhage with need for reoperation and/or transfusion, damage to surrounding structures, including the bowel, and urinary tract.  She has been counseled about this and she understands and agrees.     Lucille Passy. Ulanda Edison, M.D.     TFH/MEDQ  D:  01/29/2013  T:  01/29/2013  Job:  [id number]

## 2013-01-29 NOTE — Patient Instructions (Signed)
20 Amy Houston  01/29/2013   Your procedure is scheduled on:  01/30/13  Enter through the Main Entrance of [facility] at [place] up the phone at the desk and dial [phone].   Call this number if you have problems the morning of surgery: [phone]   Remember:   Do not eat food:After Midnight.  Do not drink clear liquids: After Midnight.  Take these medicines the morning of surgery with A SIP OF WATER: Prilosec, Lisinopril   Do not wear jewelry, make-up or nail polish.  Do not wear lotions, powders, or perfumes. You may wear deodorant.  Do not shave 48 hours prior to surgery.  Do not bring valuables to the hospital.  [facility] is not   responsible for any belongings or valuables brought to the hospital.  Contacts, dentures or bridgework may not be worn into surgery.  Leave suitcase in the car. After surgery it may be brought to your room.  For patients admitted to the hospital, checkout time is 11:00 AM the day of              discharge.   Patients discharged the day of surgery will not be allowed to drive             home.  Name and phone number of your driver: NA  Special Instructions:   Shower using CHG 2 nights before surgery and the night before surgery.  If you shower the day of surgery use CHG.  Use special wash - you have one bottle of CHG for all showers.  You should use approximately 1/3 of the bottle for each shower.   Please read over the following fact sheets that you were given:   Surgical Site Infection Prevention

## 2013-01-30 ENCOUNTER — Ambulatory Visit (HOSPITAL_COMMUNITY): Payer: Medicare Other | Admitting: Anesthesiology

## 2013-01-30 ENCOUNTER — Encounter (HOSPITAL_COMMUNITY): Payer: Self-pay | Admitting: Anesthesiology

## 2013-01-30 ENCOUNTER — Encounter (HOSPITAL_COMMUNITY)
Admission: RE | Disposition: A | Discharge status: Home / Self Care | Payer: Self-pay | Source: Ambulatory Visit | Attending: Obstetrics and Gynecology

## 2013-01-30 ENCOUNTER — Encounter (HOSPITAL_COMMUNITY): Payer: Medicare Other | Admitting: Anesthesiology

## 2013-01-30 ENCOUNTER — Ambulatory Visit (HOSPITAL_COMMUNITY)
Admission: RE | Admit: 2013-01-30 | Discharge: 2013-02-01 | Disposition: A | Discharge status: Home / Self Care | Payer: Medicare Other | Source: Ambulatory Visit | Attending: Obstetrics and Gynecology | Admitting: Obstetrics and Gynecology

## 2013-01-30 DIAGNOSIS — Z4002 Encounter for prophylactic removal of ovary: Secondary | ICD-10-CM | POA: Diagnosis not present

## 2013-01-30 DIAGNOSIS — Z853 Personal history of malignant neoplasm of breast: Secondary | ICD-10-CM | POA: Insufficient documentation

## 2013-01-30 DIAGNOSIS — Z1589 Genetic susceptibility to other disease: Secondary | ICD-10-CM | POA: Insufficient documentation

## 2013-01-30 DIAGNOSIS — I10 Essential (primary) hypertension: Secondary | ICD-10-CM | POA: Insufficient documentation

## 2013-01-30 DIAGNOSIS — G4730 Sleep apnea, unspecified: Secondary | ICD-10-CM | POA: Insufficient documentation

## 2013-01-30 DIAGNOSIS — E119 Type 2 diabetes mellitus without complications: Secondary | ICD-10-CM | POA: Insufficient documentation

## 2013-01-30 DIAGNOSIS — K219 Gastro-esophageal reflux disease without esophagitis: Secondary | ICD-10-CM | POA: Insufficient documentation

## 2013-01-30 HISTORY — PX: SALPINGOOPHORECTOMY: SHX82

## 2013-01-30 HISTORY — PX: ABDOMINAL HYSTERECTOMY: SHX81

## 2013-01-30 LAB — URINALYSIS, ROUTINE W REFLEX MICROSCOPIC
Bilirubin Urine: NEGATIVE
Glucose, UA: NEGATIVE mg/dL
Hgb urine dipstick: NEGATIVE
Ketones, ur: NEGATIVE mg/dL
NITRITE: NEGATIVE
PH: 5.5 (ref 5.0–8.0)
Protein, ur: NEGATIVE mg/dL
Specific Gravity, Urine: 1.025 (ref 1.005–1.030)
UROBILINOGEN UA: 0.2 mg/dL (ref 0.0–1.0)

## 2013-01-30 LAB — URINE MICROSCOPIC-ADD ON

## 2013-01-30 LAB — GLUCOSE, CAPILLARY: Glucose-Capillary: 172 mg/dL — ABNORMAL HIGH (ref 70–99)

## 2013-01-30 SURGERY — HYSTERECTOMY, ABDOMINAL
Anesthesia: General | Site: Abdomen

## 2013-01-30 MED ORDER — DIPHENHYDRAMINE HCL 50 MG/ML IJ SOLN: 12.5000 mg | Freq: Four times a day (QID) | INTRAMUSCULAR | Status: DC | PRN

## 2013-01-30 MED ORDER — METOPROLOL SUCCINATE ER 25 MG PO TB24
25.0000 mg | ORAL_TABLET | Freq: Every day | ORAL | Status: DC
  Administered 2013-01-31 – 2013-02-01 (×2): 25 mg via ORAL
  Filled 2013-01-30 (×2): qty 1

## 2013-01-30 MED ORDER — PHENYLEPHRINE HCL 10 MG/ML IJ SOLN
INTRAMUSCULAR | Status: AC
  Filled 2013-01-30: qty 1

## 2013-01-30 MED ORDER — LACTATED RINGERS IV SOLN
INTRAVENOUS | Status: DC
  Administered 2013-01-30: 19:00:00 via INTRAVENOUS

## 2013-01-30 MED ORDER — FENTANYL CITRATE 0.05 MG/ML IJ SOLN
INTRAMUSCULAR | Status: AC
  Administered 2013-01-30: 25 ug via INTRAVENOUS
  Filled 2013-01-30: qty 2

## 2013-01-30 MED ORDER — NALOXONE HCL 0.4 MG/ML IJ SOLN: 0.4000 mg | INTRAMUSCULAR | Status: DC | PRN

## 2013-01-30 MED ORDER — OXYCODONE-ACETAMINOPHEN 5-325 MG PO TABS
1.0000 | ORAL_TABLET | Freq: Four times a day (QID) | ORAL | Status: DC | PRN
  Administered 2013-01-31: 1 via ORAL
  Administered 2013-01-31: 2 via ORAL
  Administered 2013-01-31: 1 via ORAL
  Administered 2013-02-01: 2 via ORAL
  Administered 2013-02-01: 1 via ORAL
  Filled 2013-01-30 (×2): qty 1
  Filled 2013-01-30: qty 2
  Filled 2013-01-30: qty 1
  Filled 2013-01-30: qty 2

## 2013-01-30 MED ORDER — LIDOCAINE HCL (CARDIAC) 20 MG/ML IV SOLN
INTRAVENOUS | Status: DC | PRN
  Administered 2013-01-30: 40 mg via INTRAVENOUS
  Administered 2013-01-30: 30 mg via INTRAVENOUS

## 2013-01-30 MED ORDER — PHENYLEPHRINE HCL 10 MG/ML IJ SOLN
10.0000 mg | INTRAVENOUS | Status: DC | PRN
  Administered 2013-01-30: 10 ug/min via INTRAVENOUS

## 2013-01-30 MED ORDER — ONDANSETRON HCL 4 MG/2ML IJ SOLN
INTRAMUSCULAR | Status: AC
  Filled 2013-01-30: qty 2

## 2013-01-30 MED ORDER — VENLAFAXINE HCL 25 MG PO TABS
25.0000 mg | ORAL_TABLET | Freq: Two times a day (BID) | ORAL | Status: DC
  Administered 2013-01-30 – 2013-02-01 (×4): 25 mg via ORAL
  Filled 2013-01-30 (×4): qty 1

## 2013-01-30 MED ORDER — ONDANSETRON HCL 4 MG/2ML IJ SOLN: 4.0000 mg | Freq: Four times a day (QID) | INTRAMUSCULAR | Status: DC | PRN

## 2013-01-30 MED ORDER — LIDOCAINE HCL (CARDIAC) 20 MG/ML IV SOLN
INTRAVENOUS | Status: AC
  Filled 2013-01-30: qty 5

## 2013-01-30 MED ORDER — PHENYLEPHRINE 40 MCG/ML (10ML) SYRINGE FOR IV PUSH (FOR BLOOD PRESSURE SUPPORT)
PREFILLED_SYRINGE | INTRAVENOUS | Status: AC
  Filled 2013-01-30: qty 5

## 2013-01-30 MED ORDER — PHENYLEPHRINE HCL 10 MG/ML IJ SOLN
INTRAMUSCULAR | Status: DC | PRN
  Administered 2013-01-30: 8 ug via INTRAVENOUS
  Administered 2013-01-30: 40 ug via INTRAVENOUS
  Administered 2013-01-30: 8 ug via INTRAVENOUS
  Administered 2013-01-30 (×2): 40 ug via INTRAVENOUS
  Administered 2013-01-30: 64 ug via INTRAVENOUS

## 2013-01-30 MED ORDER — KETOROLAC TROMETHAMINE 30 MG/ML IJ SOLN
15.0000 mg | Freq: Once | INTRAMUSCULAR | Status: AC | PRN
  Administered 2013-01-30: 15 mg via INTRAVENOUS

## 2013-01-30 MED ORDER — NEOSTIGMINE METHYLSULFATE 1 MG/ML IJ SOLN
INTRAMUSCULAR | Status: AC
  Filled 2013-01-30: qty 1

## 2013-01-30 MED ORDER — EXEMESTANE 25 MG PO TABS
25.0000 mg | ORAL_TABLET | Freq: Every day | ORAL | Status: DC
  Administered 2013-01-31 – 2013-02-01 (×2): 25 mg via ORAL
  Filled 2013-01-30 (×2): qty 1

## 2013-01-30 MED ORDER — ROCURONIUM BROMIDE 100 MG/10ML IV SOLN
INTRAVENOUS | Status: AC
  Filled 2013-01-30: qty 1

## 2013-01-30 MED ORDER — SODIUM CHLORIDE 0.9 % IJ SOLN: 9.0000 mL | INTRAMUSCULAR | Status: DC | PRN

## 2013-01-30 MED ORDER — HYDROMORPHONE 0.3 MG/ML IV SOLN
INTRAVENOUS | Status: DC
  Administered 2013-01-30: 14:00:00 via INTRAVENOUS
  Administered 2013-01-30: 0.999 mg via INTRAVENOUS
  Filled 2013-01-30: qty 25

## 2013-01-30 MED ORDER — ONDANSETRON HCL 4 MG PO TABS
4.0000 mg | ORAL_TABLET | Freq: Four times a day (QID) | ORAL | Status: DC | PRN
  Administered 2013-02-01: 4 mg via ORAL
  Filled 2013-01-30: qty 1

## 2013-01-30 MED ORDER — CLINDAMYCIN PHOSPHATE 900 MG/50ML IV SOLN
900.0000 mg | Freq: Three times a day (TID) | INTRAVENOUS | Status: AC
  Administered 2013-01-30 (×2): 900 mg via INTRAVENOUS
  Filled 2013-01-30 (×2): qty 50

## 2013-01-30 MED ORDER — DEXAMETHASONE SODIUM PHOSPHATE 10 MG/ML IJ SOLN
INTRAMUSCULAR | Status: DC | PRN
  Administered 2013-01-30: 10 mg via INTRAVENOUS

## 2013-01-30 MED ORDER — ONDANSETRON HCL 4 MG/2ML IJ SOLN
INTRAMUSCULAR | Status: DC | PRN
  Administered 2013-01-30: 4 mg via INTRAVENOUS

## 2013-01-30 MED ORDER — KETOROLAC TROMETHAMINE 30 MG/ML IJ SOLN
INTRAMUSCULAR | Status: AC
  Filled 2013-01-30: qty 1

## 2013-01-30 MED ORDER — PROPOFOL 10 MG/ML IV BOLUS
INTRAVENOUS | Status: DC | PRN
  Administered 2013-01-30: 180 mg via INTRAVENOUS

## 2013-01-30 MED ORDER — MIDAZOLAM HCL 2 MG/2ML IJ SOLN: 0.5000 mg | Freq: Once | INTRAMUSCULAR | Status: DC | PRN

## 2013-01-30 MED ORDER — PANTOPRAZOLE SODIUM 40 MG PO TBEC
40.0000 mg | DELAYED_RELEASE_TABLET | Freq: Every day | ORAL | Status: DC
  Administered 2013-01-31 – 2013-02-01 (×2): 40 mg via ORAL
  Filled 2013-01-30 (×2): qty 1

## 2013-01-30 MED ORDER — 0.9 % SODIUM CHLORIDE (POUR BTL) OPTIME
TOPICAL | Status: DC | PRN
  Administered 2013-01-30: 2000 mL

## 2013-01-30 MED ORDER — FENTANYL CITRATE 0.05 MG/ML IJ SOLN
INTRAMUSCULAR | Status: AC
  Filled 2013-01-30: qty 2

## 2013-01-30 MED ORDER — LACTATED RINGERS IV SOLN
INTRAVENOUS | Status: DC
  Administered 2013-01-30 (×3): via INTRAVENOUS

## 2013-01-30 MED ORDER — DEXAMETHASONE SODIUM PHOSPHATE 10 MG/ML IJ SOLN
INTRAMUSCULAR | Status: AC
  Filled 2013-01-30: qty 1

## 2013-01-30 MED ORDER — NEOSTIGMINE METHYLSULFATE 1 MG/ML IJ SOLN
INTRAMUSCULAR | Status: DC | PRN
  Administered 2013-01-30: 4 mg via INTRAVENOUS

## 2013-01-30 MED ORDER — MIDAZOLAM HCL 2 MG/2ML IJ SOLN
INTRAMUSCULAR | Status: AC
  Filled 2013-01-30: qty 2

## 2013-01-30 MED ORDER — GLYCOPYRROLATE 0.2 MG/ML IJ SOLN
INTRAMUSCULAR | Status: AC
  Filled 2013-01-30: qty 3

## 2013-01-30 MED ORDER — MIDAZOLAM HCL 2 MG/2ML IJ SOLN
INTRAMUSCULAR | Status: DC | PRN
  Administered 2013-01-30 (×2): 1 mg via INTRAVENOUS

## 2013-01-30 MED ORDER — GLYCOPYRROLATE 0.2 MG/ML IJ SOLN
INTRAMUSCULAR | Status: DC | PRN
  Administered 2013-01-30: 0.3 mg via INTRAVENOUS
  Administered 2013-01-30: 0.6 mg via INTRAVENOUS

## 2013-01-30 MED ORDER — MEPERIDINE HCL 25 MG/ML IJ SOLN: 6.2500 mg | INTRAMUSCULAR | Status: DC | PRN

## 2013-01-30 MED ORDER — FENTANYL CITRATE 0.05 MG/ML IJ SOLN
INTRAMUSCULAR | Status: DC | PRN
  Administered 2013-01-30: 50 ug via INTRAVENOUS
  Administered 2013-01-30: 25 ug via INTRAVENOUS
  Administered 2013-01-30 (×5): 50 ug via INTRAVENOUS
  Administered 2013-01-30: 25 ug via INTRAVENOUS

## 2013-01-30 MED ORDER — FENTANYL CITRATE 0.05 MG/ML IJ SOLN
25.0000 ug | INTRAMUSCULAR | Status: DC | PRN
  Administered 2013-01-30: 25 ug via INTRAVENOUS

## 2013-01-30 MED ORDER — CLINDAMYCIN PHOSPHATE 900 MG/50ML IV SOLN
INTRAVENOUS | Status: DC | PRN
  Administered 2013-01-30: 900 mg via INTRAVENOUS

## 2013-01-30 MED ORDER — ROCURONIUM BROMIDE 100 MG/10ML IV SOLN
INTRAVENOUS | Status: DC | PRN
  Administered 2013-01-30 (×2): 10 mg via INTRAVENOUS
  Administered 2013-01-30: 40 mg via INTRAVENOUS
  Administered 2013-01-30: 10 mg via INTRAVENOUS

## 2013-01-30 MED ORDER — GLYCOPYRROLATE 0.2 MG/ML IJ SOLN
INTRAMUSCULAR | Status: AC
  Filled 2013-01-30: qty 2

## 2013-01-30 MED ORDER — PROMETHAZINE HCL 25 MG/ML IJ SOLN: 6.2500 mg | INTRAMUSCULAR | Status: DC | PRN

## 2013-01-30 MED ORDER — DIPHENHYDRAMINE HCL 12.5 MG/5ML PO ELIX: 12.5000 mg | ORAL_SOLUTION | Freq: Four times a day (QID) | ORAL | Status: DC | PRN

## 2013-01-30 MED ORDER — LISINOPRIL 20 MG PO TABS
20.0000 mg | ORAL_TABLET | Freq: Every day | ORAL | Status: DC
  Administered 2013-01-31 – 2013-02-01 (×2): 20 mg via ORAL
  Filled 2013-01-30 (×2): qty 1

## 2013-01-30 MED ORDER — FENTANYL CITRATE 0.05 MG/ML IJ SOLN
INTRAMUSCULAR | Status: AC
  Filled 2013-01-30: qty 5

## 2013-01-30 SURGICAL SUPPLY — 56 items
BAG DECANTER FOR FLEXI CONT (MISCELLANEOUS) IMPLANT
BLADE SURG 15 STRL LF C SS BP (BLADE) IMPLANT
BLADE SURG 15 STRL SS (BLADE)
CABLE HIGH FREQUENCY MONO STRZ (ELECTRODE) IMPLANT
CANISTER SUCT 3000ML (MISCELLANEOUS) ×3 IMPLANT
CATH ROBINSON RED A/P 16FR (CATHETERS) IMPLANT
CLOTH BEACON ORANGE TIMEOUT ST (SAFETY) ×3 IMPLANT
CONT PATH 16OZ SNAP LID 3702 (MISCELLANEOUS) ×3 IMPLANT
COVER TABLE BACK 60X90 (DRAPES) ×3 IMPLANT
DECANTER SPIKE VIAL GLASS SM (MISCELLANEOUS) IMPLANT
DRAPE WARM FLUID 44X44 (DRAPE) ×3 IMPLANT
DRSG OPSITE POSTOP 4X10 (GAUZE/BANDAGES/DRESSINGS) ×3 IMPLANT
DRSG VASELINE 3X18 (GAUZE/BANDAGES/DRESSINGS) ×3 IMPLANT
ELECT REM PT RETURN 9FT ADLT (ELECTROSURGICAL) ×3
ELECTRODE REM PT RTRN 9FT ADLT (ELECTROSURGICAL) ×2 IMPLANT
EVACUATOR SMOKE 8.L (FILTER) IMPLANT
FORCEPS CUTTING 33CM 5MM (CUTTING FORCEPS) IMPLANT
GLOVE BIO SURGEON STRL SZ7.5 (GLOVE) ×6 IMPLANT
GOWN STRL REUS W/ TWL XL LVL3 (GOWN DISPOSABLE) ×2 IMPLANT
GOWN STRL REUS W/TWL LRG LVL3 (GOWN DISPOSABLE) ×9 IMPLANT
GOWN STRL REUS W/TWL XL LVL3 (GOWN DISPOSABLE) ×4 IMPLANT
NS IRRIG 1000ML POUR BTL (IV SOLUTION) ×3 IMPLANT
PACK ABDOMINAL GYN (CUSTOM PROCEDURE TRAY) ×3 IMPLANT
PACK LAVH (CUSTOM PROCEDURE TRAY) ×3 IMPLANT
PAD OB MATERNITY 4.3X12.25 (PERSONAL CARE ITEMS) ×3 IMPLANT
PROTECTOR NERVE ULNAR (MISCELLANEOUS) ×3 IMPLANT
RETRACTOR WND ALEXIS 25 LRG (MISCELLANEOUS) ×2 IMPLANT
RTRCTR WOUND ALEXIS 25CM LRG (MISCELLANEOUS) ×3
SET IRRIG TUBING LAPAROSCOPIC (IRRIGATION / IRRIGATOR) IMPLANT
SOLUTION ELECTROLUBE (MISCELLANEOUS) IMPLANT
SPONGE GAUZE 4X4 12PLY STER LF (GAUZE/BANDAGES/DRESSINGS) IMPLANT
SPONGE LAP 18X18 X RAY DECT (DISPOSABLE) ×6 IMPLANT
SPONGE LAP 4X18 X RAY DECT (DISPOSABLE) ×3 IMPLANT
STAPLER VISISTAT 35W (STAPLE) ×3 IMPLANT
STRIP CLOSURE SKIN 1/4X3 (GAUZE/BANDAGES/DRESSINGS) ×3 IMPLANT
SUT PDS AB 0 CTX 60 (SUTURE) IMPLANT
SUT PLAIN 3 0 FS2 (SUTURE) IMPLANT
SUT PROLENE 0 CT 1 30 (SUTURE) IMPLANT
SUT VIC AB 0 CT1 18XCR BRD8 (SUTURE) ×6 IMPLANT
SUT VIC AB 0 CT1 27 (SUTURE)
SUT VIC AB 0 CT1 27XBRD ANBCTR (SUTURE) IMPLANT
SUT VIC AB 0 CT1 36 (SUTURE) ×15 IMPLANT
SUT VIC AB 0 CT1 8-18 (SUTURE) ×3
SUT VIC AB 2-0 UR6 27 (SUTURE) IMPLANT
SUT VIC AB 3-0 CTX 36 (SUTURE) ×3 IMPLANT
SUT VIC AB 3-0 SH 27 (SUTURE) ×1
SUT VIC AB 3-0 SH 27X BRD (SUTURE) ×2 IMPLANT
SUT VICRYL 0 TIES 12 18 (SUTURE) ×3 IMPLANT
SUT VICRYL 0 UR6 27IN ABS (SUTURE) IMPLANT
TAPE CLOTH SURG 4X10 WHT LF (GAUZE/BANDAGES/DRESSINGS) ×3 IMPLANT
TOWEL OR 17X24 6PK STRL BLUE (TOWEL DISPOSABLE) ×6 IMPLANT
TRAY FOLEY CATH 14FR (SET/KITS/TRAYS/PACK) ×3 IMPLANT
TROCAR BALLN 12MMX100 BLUNT (TROCAR) IMPLANT
TROCAR XCEL NON-BLD 11X100MML (ENDOMECHANICALS) IMPLANT
TROCAR XCEL NON-BLD 5MMX100MML (ENDOMECHANICALS) IMPLANT
WATER STERILE IRR 1000ML POUR (IV SOLUTION) IMPLANT

## 2013-01-30 NOTE — Anesthesia Postprocedure Evaluation (Signed)
Anesthesia Post Note  Patient: Amy Houston  Procedure(s) Performed: Procedure(s) (LRB): SALPINGO OOPHORECTOMY (Bilateral) HYSTERECTOMY ABDOMINAL (Bilateral)  Anesthesia type: GA  Patient location: PACU  Post pain: Pain level controlled  Post assessment: Post-op Vital signs reviewed  Last Vitals:  Filed Vitals:   01/30/13 1230  BP: 131/50  Pulse: 59  Temp:   Resp: 12    Post vital signs: Reviewed  Level of consciousness: sedated  Complications: No apparent anesthesia complications

## 2013-01-30 NOTE — Progress Notes (Signed)
Patient ID: Amy Houston, female   DOB: February 09, 1945, 68 y.o.   MRN: [id number] Pt was examined 01-29-13 and she reports no change in her health since that time

## 2013-01-30 NOTE — Op Note (Signed)
NAMEVANITA, CANNELL NO.:  [id number]  MEDICAL RECORD NO.:  [id number]  LOCATION:  [facility]                          FACILITY:  [place]  PHYSICIAN:  Lucille Passy. Ulanda Edison, M.D. DATE OF BIRTH:  1945-12-04  DATE OF PROCEDURE:  01/30/2013 DATE OF DISCHARGE:                              OPERATIVE REPORT   PREOPERATIVE DIAGNOSIS:  Genetic abnormality predisposing her to ovarian cancer.  POSTOPERATIVE DIAGNOSIS:  Genetic abnormality predisposing her to ovarian cancer with pelvic adhesions, abdominal adhesions, and adhesions of loops of bowel to each other.  OPERATION:  Laparotomy, minimal lysis of adhesions, abdominal hysterectomy, bilateral salpingo-oophorectomy.  OPERATOR:  Lucille Passy. Ulanda Edison, M.D.  ASSISTANT:  Thornell Sartorius, MD.  ANESTHESIA:  General anesthesia.  Patient was brought to the operating room and was placed under satisfactory general anesthesia.  Intubation was difficulty, so was done without a laryngoscope using a light, but not the video laryngoscope. This was done by Dr. Lyndle Herrlich.  Patient was brought to the operating room and placed under general anesthesia.  As stated, she was placed in a frogleg position, supine on the table.  The abdomen was prepped with Betadine solution.  The vagina was prepped with Betadine.  The urethra was prepped and a Foley catheter was inserted to straight drain.  Patient was then placed supine.  The abdomen was draped as a sterile field.  A marking pen had been used to identify where I wanted to make the incision to avoid the crease in her abdominal wall.  A time-out was done, and then an incision was made through the lower abdomen through the skin, subcutaneous tissue down to the fascia.  The fascia was incised transversely.  The rectus muscle was split in the midline.  Peritoneum opened vertically and it was apparent, but she had a lot of omental adhesions to the peritoneum and these prohibited without removing them,  examination of the upper abdomen. Some bowel loops came out and it was apparent that she had adherence of the bowel loops to each other.  There were quite extensive.  A couple were released, but I decided not release anymore because she was asymptomatic and no bowel dilatation was apparent.  Exploration of the pelvis revealed the left adnexa to be adherent to the bowel.  The right adnexa seemed to be free.  The uterus was small.  The anterior and posterior cul-de-sacs were free of disease.  I initially placed an Alexis retractor, but I decided against that because I could not confirm superiorly that no bowel loops were called.  I removed the Alexis retractor, used the Balfour retractor and then packed away the bowel using 3 packs.  The upper pedicles of the uterus were clamped across. The adhesions on the left were divided by blunt dissection and Bovie dissection.  Then the operation was begun by bovieing the right round ligament, clamping the right infundibulopelvic ligament, doubly suture ligating it, repeating the same process on the left.  Then I worked my way down the sides of the uterus with interrupted sutures, clamping, cutting, and interrupted sutures of 0 Vicryl.  When I got to the uterosacral ligament; I clamped, cut, and suture ligated them and  held them.  Then entered the right side of the vagina, clamped the left side, and removed the cervix and uterus.  I placed angle sutures bilaterally and then closed the central portion of the cuff with interrupted figure- of-eight sutures of 0 Vicryl.  There was no bleeding at this point. Liberal irrigation confirmed hemostasis.  I was unable to palpate the ureters, but I felt we stayed well away from the ureters at all times. I did try to feel them enough and I thought I might have, but I was unable to definitely identify them.  All bleeders on the omentum were coagulated.  The packs and retractor were removed and the rectus muscle and  peritoneum were closed with interrupted sutures of 0 Vicryl in 1 layer.  The fascia was closed with 2 running sutures of 0 Vicryl. Subcutaneous tissue with 3-0 Vicryl and the skin was closed with automatic staples.  The right side of the abdominal peritoneum was not present because of the dense adhesions of the omentum.  Blood loss for the procedure was estimated by me at 200 mL.  The nurse anesthetist felt that it had been only 50 mL, but I will stick with 200.  Sponge and needle counts were correct, and patient was returned to recovery in satisfactory condition.     Lucille Passy. Ulanda Edison, M.D.     TFH/MEDQ  D:  01/30/2013  T:  01/30/2013  Job:  [id number]

## 2013-01-30 NOTE — Transfer of Care (Signed)
Immediate Anesthesia Transfer of Care Note  Patient: Amy Houston  Procedure(s) Performed: Procedure(s): SALPINGO OOPHORECTOMY (Bilateral) HYSTERECTOMY ABDOMINAL (Bilateral)  Patient Location: PACU  Anesthesia Type:General  Level of Consciousness: awake, alert , oriented and patient cooperative  Airway & Oxygen Therapy: Patient Spontanous Breathing and Patient connected to nasal cannula oxygen  Post-op Assessment: Report given to PACU RN and Post -op Vital signs reviewed and stable  Post vital signs: Reviewed and stable  Complications: No apparent anesthesia complications

## 2013-01-30 NOTE — Anesthesia Preprocedure Evaluation (Signed)
Anesthesia Evaluation  Patient identified by MRN, date of birth, ID band Patient awake    Reviewed: Allergy & Precautions, H&P , NPO status , Patient's Chart, lab work & pertinent test results, reviewed documented beta blocker date and time   History of Anesthesia Complications (+) DIFFICULT AIRWAY  Airway Mallampati: III TM Distance: >3 FB Neck ROM: full  Mouth opening: Limited Mouth Opening  Dental no notable dental hx.    Pulmonary sleep apnea ,  breath sounds clear to auscultation  Pulmonary exam normal       Cardiovascular hypertension, Pt. on medications and Pt. on home beta blockers Rhythm:regular Rate:Normal     Neuro/Psych    GI/Hepatic GERD-  ,  Endo/Other  diabetes, Type 2  Renal/GU      Musculoskeletal  (+) Arthritis -,   Abdominal   Peds  Hematology   Anesthesia Other Findings Easy mask per old chart Will use glidescope if light wand is not successful UPP relieved sleep apnea component  Reproductive/Obstetrics                           Anesthesia Physical  Anesthesia Plan  ASA: II  Anesthesia Plan: General   Post-op Pain Management:    Induction: Intravenous  Airway Management Planned: Oral ETT  Additional Equipment:   Intra-op Plan:   Post-operative Plan: Extubation in OR  Informed Consent: I have reviewed the patients History and Physical, chart, labs and discussed the procedure including the risks, benefits and alternatives for the proposed anesthesia with the patient or authorized representative who has indicated his/her understanding and acceptance.   Dental Advisory Given  Plan Discussed with: CRNA, Anesthesiologist and Surgeon  Anesthesia Plan Comments:         Anesthesia Quick Evaluation

## 2013-01-30 NOTE — Anesthesia Postprocedure Evaluation (Signed)
Anesthesia Post Note  Patient: Amy Houston  Procedure(s) Performed: Procedure(s) (LRB): SALPINGO OOPHORECTOMY (Bilateral) HYSTERECTOMY ABDOMINAL (Bilateral)  Anesthesia type: General  Patient location: Women's Unit  Post pain: Pain level controlled  Post assessment: Post-op Vital signs reviewed  Last Vitals:  Filed Vitals:   01/30/13 1627  BP: 152/64  Pulse: 76  Temp: 36.6 C  Resp: 11    Post vital signs: Reviewed  Level of consciousness: sedated  Complications: No apparent anesthesia complicationsf Anesthesia Post-op Note  Patient: Amy Houston  Procedure(s) Performed: Procedure(s): SALPINGO OOPHORECTOMY (Bilateral) HYSTERECTOMY ABDOMINAL (Bilateral)  Patient Location: PACU and Women's Unit  Anesthesia Type:General  Level of Consciousness: oriented and patient cooperative  Airway and Oxygen Therapy: Patient Spontanous Breathing and Patient connected to nasal cannula oxygen  Post-op Pain: mild  Post-op Assessment: Post-op Vital signs reviewed, Patient's Cardiovascular Status Stable, No signs of Nausea or vomiting, Adequate PO intake and Pain level controlled  Post-op Vital Signs: Reviewed and stable  Complications: No apparent anesthesia complications

## 2013-01-30 NOTE — Progress Notes (Signed)
Patient ID: Amy Houston, female   DOB: May 22, 1945, 68 y.o.   MRN: [id number] Brief op note:  TAH BSO for genetic abnormality predisposing her to ovarian cancer General anesthesia,Op [name] Asst Bovard EBL 200 cc's. Dense omental adhesions to the abdominal wall and adhesions between loops of bowel Adhesions of the bowel to the left adnexa. Atrophic uterus and ovaries.

## 2013-01-31 ENCOUNTER — Encounter (HOSPITAL_COMMUNITY): Payer: Self-pay | Admitting: Obstetrics and Gynecology

## 2013-01-31 DIAGNOSIS — Z4002 Encounter for prophylactic removal of ovary: Secondary | ICD-10-CM | POA: Diagnosis not present

## 2013-01-31 LAB — CBC WITH DIFFERENTIAL/PLATELET
BASOS ABS: 0 10*3/uL (ref 0.0–0.1)
Basophils Relative: 0 % (ref 0–1)
EOS PCT: 0 % (ref 0–5)
Eosinophils Absolute: 0 10*3/uL (ref 0.0–0.7)
HCT: 31.2 % — ABNORMAL LOW (ref 36.0–46.0)
Hemoglobin: 10.2 g/dL — ABNORMAL LOW (ref 12.0–15.0)
LYMPHS PCT: 12 % (ref 12–46)
Lymphs Abs: 1.5 10*3/uL (ref 0.7–4.0)
MCH: 30.7 pg (ref 26.0–34.0)
MCHC: 32.7 g/dL (ref 30.0–36.0)
MCV: 94 fL (ref 78.0–100.0)
Monocytes Absolute: 1.3 10*3/uL — ABNORMAL HIGH (ref 0.1–1.0)
Monocytes Relative: 10 % (ref 3–12)
NEUTROS ABS: 9.3 10*3/uL — AB (ref 1.7–7.7)
Neutrophils Relative %: 77 % (ref 43–77)
PLATELETS: 263 10*3/uL (ref 150–400)
RBC: 3.32 MIL/uL — ABNORMAL LOW (ref 3.87–5.11)
RDW: 12.6 % (ref 11.5–15.5)
WBC: 12.1 10*3/uL — ABNORMAL HIGH (ref 4.0–10.5)

## 2013-01-31 MED ORDER — DOCUSATE SODIUM 100 MG PO CAPS
100.0000 mg | ORAL_CAPSULE | Freq: Two times a day (BID) | ORAL | Status: DC
  Administered 2013-01-31 – 2013-02-01 (×3): 100 mg via ORAL
  Filled 2013-01-31 (×3): qty 1

## 2013-01-31 NOTE — Progress Notes (Signed)
Patient ID: Amy Houston, female   DOB: March 24, 1945, 68 y.o.   MRN: [id number] #1 afebrile BP normal output excellent HGB acceptable. Catheter out and has voided Has tolerated a diet but does not feel ready for d/c

## 2013-02-01 DIAGNOSIS — Z4002 Encounter for prophylactic removal of ovary: Secondary | ICD-10-CM | POA: Diagnosis not present

## 2013-02-01 MED ORDER — DSS 100 MG PO CAPS: 100.0000 mg | ORAL_CAPSULE | Freq: Two times a day (BID) | ORAL | Status: DC

## 2013-02-01 NOTE — Progress Notes (Signed)
Pt d/c home ambulatory, stable with family to private car. D/c instructions and prescriptions reviewed with pt. Pt verbalized understanding. Pt has f/u appt with MD. No further questions for RN.

## 2013-02-01 NOTE — Discharge Summary (Signed)
NAMEBRYNJA, MARKER NO.:  [id number]  MEDICAL RECORD NO.:  [id number]  LOCATION:  9305                          FACILITY:  [place]  PHYSICIAN:  Lucille Passy. Ulanda Edison, M.D. DATE OF BIRTH:  Apr 29, 1945  DATE OF ADMISSION:  01/30/2013 DATE OF DISCHARGE:  02/01/2013                              DISCHARGE SUMMARY   HISTORY OF PRESENT ILLNESS:  This is a 68 year old white female, para 2- 0-1-2, who was admitted for abdominal hysterectomy and bilateral salpingo-oophorectomy because of a gene mutation predisposed her to ovarian cancer.  She underwent abdominal hysterectomy, bilateral salpingo-oophorectomy, lysis of adhesions by Dr. Ulanda Edison with Dr. Melba Coon assisting under general anesthesia.  Notable findings were adherence of bowel loops to each other and dense omental adhesions to the lower anterior abdominal wall.  The origin of these was not known.  It is possible they resulted from prior tubal ligation.  Postoperatively, the patient did well.  Her initial hemoglobin was 12.  Followup hemoglobin 10.2, hematocrit 31.2, white count 12,100, platelet count 263,000, normal differential.  Pathology report showed cervix with squamous metaplasia, atrophic endometrium, no hyperplasia or carcinoma, myometrium, adenomyosis, uterine serosa unremarkable.  Right and left ovaries are unremarkable.  Right and left fallopian tubes were unremarkable.  FINAL DIAGNOSES:  Genetic mutation predisposing her to ovarian cancer, adenomyosis, dense adhesions.  OPERATION:  Abdominal hysterectomy, bilateral salpingo-oophorectomy.  FINAL CONDITION:  Improved.  INSTRUCTIONS:  Include our regular discharge instructions.  No vaginal entrance, no heavy lifting, or strenuous activity.  Call with any fever above 100.4 degrees.  Call with any heavy vaginal bleeding, or any significant problems.  She is to return to the office in 5 days to have her staples removed and bandage removed.     Lucille Passy.  Ulanda Edison, M.D.     TFH/MEDQ  D:  02/01/2013  T:  02/01/2013  Job:  [id number]

## 2013-02-01 NOTE — Discharge Instructions (Signed)
No vaginal entrance. Call with temp> 100.4 degrees, with heavy vaginal bleeding , severe pain or any problems.

## 2013-02-01 NOTE — Progress Notes (Signed)
Patient ID: Amy Houston, female   DOB: 1945/11/26, 68 y.o.   MRN: [id number] #2 afebrile BP normal Has passed flatus and is ready for d/c. She has pain meds at home from her mastectomy

## 2013-04-09 ENCOUNTER — Other Ambulatory Visit (HOSPITAL_COMMUNITY): Payer: Self-pay | Admitting: Oncology

## 2013-04-09 ENCOUNTER — Encounter (HOSPITAL_COMMUNITY): Payer: Self-pay | Admitting: Oncology

## 2013-04-09 DIAGNOSIS — Z853 Personal history of malignant neoplasm of breast: Secondary | ICD-10-CM

## 2013-04-09 DIAGNOSIS — R898 Other abnormal findings in specimens from other organs, systems and tissues: Secondary | ICD-10-CM | POA: Insufficient documentation

## 2013-04-09 DIAGNOSIS — C50911 Malignant neoplasm of unspecified site of right female breast: Secondary | ICD-10-CM

## 2013-04-09 HISTORY — DX: Other abnormal findings in specimens from other organs, systems and tissues: R89.8

## 2013-05-06 ENCOUNTER — Encounter: Payer: Self-pay | Admitting: Orthopedic Surgery

## 2013-05-06 ENCOUNTER — Ambulatory Visit (INDEPENDENT_AMBULATORY_CARE_PROVIDER_SITE_OTHER): Payer: Medicare Other | Admitting: Orthopedic Surgery

## 2013-05-06 VITALS — BP 125/73 | Ht 60.0 in | Wt 165.0 lb

## 2013-05-06 DIAGNOSIS — M6530 Trigger finger, unspecified finger: Secondary | ICD-10-CM

## 2013-05-06 NOTE — Progress Notes (Signed)
Patient ID: Amy Houston, female   DOB: Nov 25, 1945, 68 y.o.   MRN: [id number]  Chief Complaint  Patient presents with  . Hand Pain    Left long trigger finger    68 years old 3-4 month history of catching and locking left long finger no history of previous injury no history of carpal tunnel does complain of numbness and tingling with catching. She pulls the finger out in the morning time catch it. Fatigue watering of the eyes palpitations snoring heartburn nausea diarrhea stiffness numbness and tingling nervousness noted under system review  Penicillin allergy  Tubal ligation cholecystectomy lateral mastectomy tonsillectomy hysterectomy  Arthritis cancer diabetes family history.  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 drinking  Vital signs:   General the patient is well-developed and well-nourished grooming and hygiene are normal Oriented x3 Mood and affect normal Ambulation normal  Inspection of the left long finger tenderness over the A1 pulley catching and popping on range of motion which is full  All joints are stable Motor exam is normal Skin clean dry and intact  Cardiovascular exam is normal Sensory exam normal  Trigger finger  Injection   Procedure note trigger finger injection  Diagnosis trigger finger Postop diagnosis trigger finger Procedure injection of trigger finger Finger injected left long finger  Details of procedure: After verbal consent and timeout to confirm site the leftlong finger was injected with 1 cc of 40 mg of Depo-Medrol and 1 cc of 1% lidocaine The procedure was tolerated well without complication

## 2013-05-06 NOTE — Patient Instructions (Addendum)
Joint Injection  Care After  Refer to this sheet in the next few days. These instructions provide you with information on caring for yourself after you have had a joint injection. Your caregiver also may give you more specific instructions. Your treatment has been planned according to current medical practices, but problems sometimes occur. Call your caregiver if you have any problems or questions after your procedure.  After any type of joint injection, it is not uncommon to experience:  Soreness, swelling, or bruising around the injection site.  Mild numbness, tingling, or weakness around the injection site caused by the numbing medicine used before or with the injection. It also is possible to experience the following effects associated with the specific agent after injection:  Iodine-based contrast agents:  Allergic reaction (itching, hives, widespread redness, and swelling beyond the injection site).  Corticosteroids (These effects are rare.):  Allergic reaction.  Increased blood sugar levels (If you have diabetes and you notice that your blood sugar levels have increased, notify your caregiver).  Increased blood pressure levels.  Mood swings.  Hyaluronic acid in the use of viscosupplementation.  Temporary heat or redness.  Temporary rash and itching.  Increased fluid accumulation in the injected joint. These effects all should resolve within a day after your procedure.  HOME CARE INSTRUCTIONS  Limit yourself to light activity the day of your procedure. Avoid lifting heavy objects, bending, stooping, or twisting.  Take prescription or over-the-counter pain medication as directed by your caregiver.  You may apply ice to your injection site to reduce pain and swelling the day of your procedure. Ice may be applied 3-4 times:  Put ice in a plastic bag.  Place a towel between your skin and the bag.  Leave the ice on for no longer than 15-20 minutes each time. SEEK IMMEDIATE MEDICAL CARE IF:   Pain and swelling get worse rather than better or extend beyond the injection site.  Numbness does not go away.  Blood or fluid continues to leak from the injection site.  You have chest pain.  You have swelling of your face or tongue.  You have trouble breathing or you become dizzy.  You develop a fever, chills, or severe tenderness at the injection site that last longer than 1 day. MAKE SURE YOU:  Understand these instructions.  Watch your condition.  Get help right away if you are not doing well or if you get worse. Document Released: 09/01/2010 Document Revised: 03/13/2011 Document Reviewed: 09/01/2010  [facility] Patient Information 2014 [place].      Trigger Finger Trigger finger (digital tendinitis and stenosing tenosynovitis) is a common disorder that causes an often painful catching of the fingers or thumb. It occurs as a clicking, snapping, or locking of a finger in the palm of the hand. This is caused by a problem with the tendons that flex or bend the fingers sliding smoothly through their sheaths. The condition may occur in any finger or a couple fingers at the same time.  The finger may lock with the finger curled or suddenly straighten out with a snap. This is more common in patients with rheumatoid arthritis and diabetes. Left untreated, the condition may get worse to the point where the finger becomes locked in flexion, like making a fist, or less commonly locked with the finger straightened out. CAUSES   Inflammation and scarring that lead to swelling around the tendon sheath.  Repeated or forceful movements.  Rheumatoid arthritis, an autoimmune disease that affects joints.  Gout.  Diabetes mellitus. SIGNS AND SYMPTOMS  Soreness and swelling of your finger.  A painful clicking or snapping as you bend and straighten your finger. DIAGNOSIS  Your health care provider will do a physical exam of your finger to diagnose trigger finger. TREATMENT    Splinting for 6 8 weeks may be helpful.  Nonsteroidal anti-inflammatory medicines (NSAIDs) can help to relieve the pain and inflammation.  Cortisone injections, along with splinting, may speed up recovery. Several injections may be required. Cortisone may give relief after one injection.  Surgery is another treatment that may be used if conservative treatments do not work. Surgery can be minor, without incisions (a cut does not have to be made), and can be done with a needle through the skin.  Other surgical choices involve an open procedure in which the surgeon opens the hand through a small incision and cuts the pulley so the tendon can again slide smoothly. Your hand will still work fine. HOME CARE INSTRUCTIONS  Apply ice to the injured area, twice per day:  Put ice in a plastic bag.  Place a towel between your skin and the bag.  Leave the ice on for 20 minutes, 3 4 times a day.  Rest your hand often. MAKE SURE YOU:   Understand these instructions.  Will watch your condition.  Will get help right away if you are not doing well or get worse. Document Released: 10/09/2003 Document Revised: 08/21/2012 Document Reviewed: 05/21/2012 [facility] Patient Information 2014 [place].

## 2013-05-08 ENCOUNTER — Ambulatory Visit
Admission: RE | Admit: 2013-05-08 | Discharge: 2013-05-08 | Disposition: A | Discharge status: Home / Self Care | Payer: Medicare Other | Source: Ambulatory Visit | Attending: Oncology | Admitting: Oncology

## 2013-05-08 DIAGNOSIS — R898 Other abnormal findings in specimens from other organs, systems and tissues: Secondary | ICD-10-CM

## 2013-05-08 DIAGNOSIS — Z853 Personal history of malignant neoplasm of breast: Secondary | ICD-10-CM

## 2013-05-08 DIAGNOSIS — C50911 Malignant neoplasm of unspecified site of right female breast: Secondary | ICD-10-CM

## 2013-05-08 MED ORDER — GADOBENATE DIMEGLUMINE 529 MG/ML IV SOLN
15.0000 mL | Freq: Once | INTRAVENOUS | Status: AC | PRN
  Administered 2013-05-08: 15 mL via INTRAVENOUS

## 2013-06-04 ENCOUNTER — Telehealth: Payer: Self-pay | Admitting: *Deleted

## 2013-06-04 NOTE — Telephone Encounter (Signed)
Pt called stating that her daughter is having genetics completed and Dr. Nori Riis need her genetic results.  Faxed results to Dorian Pod at Dr. Verlon Au office per pts request.  Mailed copy to pt as well.

## 2013-06-11 NOTE — Progress Notes (Signed)
Amy Bogus, MD [address] [place] [address]  Breast cancer, right breast, IDC, Multifocal - Plan: CBC with Differential, Comprehensive metabolic panel, CBC with Differential, Comprehensive metabolic panel, Cancer antigen 27.29  Abnormal genetic test  CURRENT THERAPY: Aromasin daily beginning on 06/03/2012 and she will take this lifelong due to increased recurrence rate with PALB2 and CHEK2 positivity.   INTERVAL HISTORY: Amy Houston 68 y.o. female returns for  regular visit for followup of right sided breast cancer,GRADE II, 1.2 cm invasive ductal carcinoma, ER +99%, PR +72%, grade 2, LV I positive, Ki-67 marker high at 60%, but 4 sentinel lymph nodes negative for metastatic disease. HER-2/neu nonamplified. S/P Bilateral mastectomy on 05/08/2012 by Dr. Osborn Coho. Her Oncotype DX score was 14 giving her recurrence rate of 9% at 5 years of hormonal therapy. With that in mind she was not considered a candidate for sytemic chemotherapy. Now on Aromasin beginning on 06/03/2012 and she will take this lifelong due to her PALB2 And CHEK 2 positivity. Additionally, she is S/P bilateral salpingo-oophorectomy on 01/30/2013. 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 followed by radiation therapy. She was not given tamoxifen. There was some question of Megace one tablet a day but she is not sure of the duration. This may have been for hot flashes which she remembers were severe. She was eventually placed on Effexor for hot flashes but is now on the Effexor for depression.    Breast cancer, right breast, IDC, Multifocal   03/19/2012 Initial Diagnosis Breast cancer, right breast, IDC, Multifocal   05/08/2012 Surgery Bilateral simple mastectomies by Dr. Margot Chimes   06/03/2012 -  Chemotherapy Aromatase inhibitor.  PALB2 and  CHEK2+ and therefore would benefit from lifelong AI therapy.   01/30/2013 Surgery Laparotomy, minimal lysis of adhesions, abdominal hysterectomy, bilateral salpingo-oophorectomy.   We spent time discussing the implications of her PALB2 and CHEK2 mutation positivity. She is well educated from her appointment with the [facility]. According to Roma Kayser, genetics counselor:  We reviewed the natural history and genetic etiology of sporadic, familial and hereditary cancer syndromes. PALB2 and CHEK2 are moderate risk genes. Her mutation in [place] is a common deletion called c.1100del, which is typically seen in the [place] and [place] european population. We reviewed the risks for cancer based on these mutations. We discussed that CHEK2 is associated with an increased risk for breast cancer in both men (~1%) and women (~20-48%), as well as prostate (24-44%) and colon cancer (up to ~10%). PALB2 mutations increase the risk for breast cancer (25-40%), as well as prostate (not quantified), ovarian (not quantified) and pancreatic cancers (not quantified). Neither of these genes has clinical guidelines, so we would follow her based on the general risk for cancer and our experience following for other syndromes. We discussed that having two deleterious mutations, also called double heterozygote, is uncommon. There is not a lot of information about the risks associated with this state. Most of the studies involving double heterozygosity involves the BRCA genes, or a BRCA gene with a moderate risk gene such as CHEK2 or PALB2. These studies have found that there is not an increased risk in severity for breast cancer specifically, more than that of having one mutation. We discussed that there are no studies of individuals with PALB2 and CHEK2 mutations to  know if this risk is similar. Based on the literature, we would follow [name], in regards to her breast cancer risk, based on the CHEK2 mutation, as  that convey's the greatest risk. Since [name] has had a double mastectomy, she has reduced her risk for breast cancer to the greatest extent.   We also discussed the risk for other family members. Each of her siblings and daughters has a 50% chance of having a CHEK2 mutation and a 50% chance of having a PALB2 mutation. We also discussed that they each have an increased risk (about a 25% chance) of also being a double heterozygote for these mutations. We reviewed the medical management changes that we would recommend, and that genetic testing would involve a blood test to look for the specific mutations found in the patient, although it would not be performed at the same lab ([facility]) as they do not do single site testing of these two genes.. They voiced understanding of this information.  I personally reviewed and went over radiographic studies with the patient.  The results are noted within this dictation.    Alexxis's daughter was recently diagnosed with breast cancer.  She reports that they found one lesion.  MRI of breast was negative any other lesions.  Dr. Excell Seltzer is her surgeon and he will be performing a lumpectomy in the near future.  Of course, I have offered the services of [facility] to Amy Houston and she is welcome to relay that information to her (if we are more convenient for her daughter).  This is the daughter that did not undergo genetic testing and now may be a good time for her to do so now that she has been diagnosed at a young age.  [name] shows me an erythematous rash on her left arm that is on the flexor surface of her olecranon (lateral antecubital area) with a central raised area.  She denies pruritis.  She denies pain.  I recommended OTC hydrocortisone to this area and is worsened, she will contact the clinic or report to her primary care provider.  Oncologically, she denies any complaints and ROS questioning is negative.  Past Medical History    Diagnosis Date  . HTN (hypertension)   . GERD (gastroesophageal reflux disease)     tums  . IBS (irritable bowel syndrome)   . Hemorrhoid   . History of blood transfusion     post child birth  . PPD positive, treated 1999  . Sleep apnea     "gone after I had OR" (05/08/2012)  . Diabetes mellitus 1999    "dx; never on meds" (05/08/2012)  . H/O hiatal hernia   . Arthritis     "of the spine" (05/08/2012)  . Breast cancer 1995    "left" (05/08/2012)  . Breast cancer, right breast, IDC, Multifocal 03/12/2012    Multifocal, 10 and 8 left breast   . Osteopenia 12/11/2012    On Ca++ and Vit D. Bone density in April 2014.  Next bone density due in April 2016.  Marland Kitchen PONV (postoperative nausea and vomiting)   . Exertional shortness of breath   . Abnormal genetic test 04/09/2013    PALB2 and CHEK2 positivity     has Popliteal cyst; Breast cancer, right breast, IDC, Multifocal; Hx Left breast cancer, UOQ, IDC, receptor negative; GERD (gastroesophageal reflux disease); Adenomatous polyps; Thyroid nodule; Osteopenia; Abnormal genetic test; and Acquired trigger finger on her problem list.     is allergic  to penicillins.  Ms. Banales does not currently have medications on file.  Past Surgical History  Procedure Laterality Date  . Tubal ligation  1976  . Mastectomy complete / simple w/ sentinel node biopsy Right 05/08/2012  . Mastectomy complete / simple Left 05/08/2012  . Uvulopalatopharyngoplasty, tonsillectomy and septoplasty  ~ 2008  . Cholecystectomy  1992  . Dilation and curettage of uterus  1968  . Tonsillectomy and adenoidectomy  ~ 2008  . Breast lumpectomy Left 1995  . Breast biopsy Left 1995  . Breast biopsy Bilateral 03/2012    "one on the left; 2 on the right" (05/08/2012)  . Simple mastectomy with axillary sentinel node biopsy Right 05/08/2012    Procedure: RIGHT TOTAL MASTECTOMY WITH AXILLARY SENTINEL NODE BIOPSY;  Surgeon: Haywood Lasso, MD;  Location: [place];  Service: General;   Laterality: Right;  . Simple mastectomy with axillary sentinel node biopsy Left 05/08/2012    Procedure: LEFT TOTAL MASTECTOMY;  Surgeon: Haywood Lasso, MD;  Location: [place];  Service: General;  Laterality: Left;  . Colonoscopy  12/06/93    Dr. Hayes:single diminutive polyp of the descending colon/extrernal hemorrhoids, benign path  . Colonoscopy  11/30/98    Dr Hayes:small rectal polyp/internal hemorrhoids, benign path  . Colonoscopy  11/26/2006    Dr. Hayes:sigmoid polyp/small interal hemorrhoids, adenomatous  . Esophagogastroduodenoscopy  1994    Dr. Cristina Gong: small hiatal hernia and Schatzki's ring widely patent, CLO test negative  . Colonoscopy with esophagogastroduodenoscopy (egd) N/A 07/01/2012    Procedure: COLONOSCOPY WITH ESOPHAGOGASTRODUODENOSCOPY (EGD);  Surgeon: Danie Binder, MD;  [facility];  Service: Endoscopy;  Laterality: N/A;  12:30-rescheduled to [place] notified pt  . Esophageal dilation  07/01/2012    Procedure: ESOPHAGEAL DILATION;  Surgeon: Danie Binder, MD;  [facility];  Service: Endoscopy;;  . Esophagogastroduodenoscopy (egd) with esophageal dilation N/A 07/23/2012    Procedure: ESOPHAGOGASTRODUODENOSCOPY (EGD) WITH ESOPHAGEAL DILATION;  Surgeon: Danie Binder, MD;  [facility];  Service: Endoscopy;  Laterality: N/A;  8:45  . Salpingoophorectomy Bilateral 01/30/2013    Procedure: SALPINGO OOPHORECTOMY;  Surgeon: Melina Schools, MD;  Location: [facility];  Service: Gynecology;  Laterality: Bilateral;  . Abdominal hysterectomy Bilateral 01/30/2013    Procedure: HYSTERECTOMY ABDOMINAL;  Surgeon: Melina Schools, MD;  Location: [facility];  Service: Gynecology;  Laterality: Bilateral;    Denies any headaches, dizziness, double vision, fevers, chills, night sweats, nausea, vomiting, diarrhea, constipation, chest pain, heart palpitations, shortness of breath, blood in stool, black tarry stool, urinary pain, urinary burning, urinary  frequency, hematuria.   PHYSICAL EXAMINATION  ECOG PERFORMANCE STATUS: 0 - Asymptomatic  Filed Vitals:   06/12/13 1400  BP: 159/81  Pulse: 81  Temp: 98.4 F (36.9 C)  Resp: 18    GENERAL:alert, healthy, no distress, well nourished, well developed, comfortable, cooperative and smiling SKIN: skin color, texture, turgor are normal, no rashes or significant lesions HEAD: Normocephalic, No masses, lesions, tenderness or abnormalities EYES: normal, PERRLA, EOMI, Conjunctiva are pink and non-injected EARS: External ears normal OROPHARYNX:lips, buccal mucosa, and tongue normal and mucous membranes are moist  NECK: supple, no adenopathy, thyroid normal size, non-tender, without nodularity, no stridor, non-tender, trachea midline LYMPH:  no palpable lymphadenopathy, no hepatosplenomegaly BREAST:not examined LUNGS: clear to auscultation and percussion HEART: regular rate & rhythm, no murmurs, no gallops, S1 normal and S2 normal ABDOMEN:abdomen soft, non-tender, obese, normal bowel sounds, no masses or organomegaly and no hepatosplenomegaly BACK: Back  symmetric, no curvature., No CVA tenderness EXTREMITIES:less then 2 second capillary refill, no joint deformities, effusion, or inflammation, no skin discoloration, no clubbing, no cyanosis, positive findings:  Left lateral antecubital erythematous rash with a centrally raised area  NEURO: alert & oriented x 3 with fluent speech, no focal motor/sensory deficits, gait normal    LABORATORY DATA: CBC    Component Value Date/Time   WBC 12.1* 01/31/2013 0525   RBC 3.32* 01/31/2013 0525   HGB 10.2* 01/31/2013 0525   HCT 31.2* 01/31/2013 0525   PLT 263 01/31/2013 0525   MCV 94.0 01/31/2013 0525   MCH 30.7 01/31/2013 0525   MCHC 32.7 01/31/2013 0525   RDW 12.6 01/31/2013 0525   LYMPHSABS 1.5 01/31/2013 0525   MONOABS 1.3* 01/31/2013 0525   EOSABS 0.0 01/31/2013 0525   BASOSABS 0.0 01/31/2013 0525      Chemistry      Component Value Date/Time   NA  141 01/29/2013 1405   K 4.1 01/29/2013 1405   CL 101 01/29/2013 1405   CO2 26 01/29/2013 1405   BUN 17 01/29/2013 1405   CREATININE 0.94 01/29/2013 1405      Component Value Date/Time   CALCIUM 10.0 01/29/2013 1405   ALKPHOS 63 01/29/2013 1405   AST 24 01/29/2013 1405   ALT 27 01/29/2013 1405   BILITOT 0.4 01/29/2013 1405        RADIOGRAPHIC STUDIES:  05/08/2013  CLINICAL DATA: Patient is a 68 year old female with a personal  history of left breast cancer treated with conservation therapy in  1995. She subsequently had a diagnosis of right breast cancer in  2014 and underwent bilateral mastectomy. She is currently  asymptomatic.  LABS: BUN 15, creatinine 0.9  EXAM:  BILATERAL BREAST MRI WITH AND WITHOUT CONTRAST  TECHNIQUE:  Multiplanar, multisequence MR images of both breasts were obtained  prior to and following the intravenous administration of 27m of  MultiHance.  THREE-DIMENSIONAL MR IMAGE RENDERING ON INDEPENDENT WORKSTATION:  Three-dimensional MR images were rendered by post-processing of the  original MR data on an independent workstation. The  three-dimensional MR images were interpreted, and findings are  reported in the following complete MRI report for this study. Three  dimensional images were evaluated at the independent DynaCad  workstation  COMPARISON: Previous exams  FINDINGS:  Breast composition: Patient is status post bilateral mastectomy.  Background parenchymal enhancement: No residual parenchymal  enhancement identified.  Right breast: Status post mastectomy. No abnormal contrast  enhancement.  Left breast: Status post mastectomy. No abnormal contrast  enhancement.  Lymph nodes: The axillary lymph node chains are symmetric and  unremarkable. Incidental note is made of a left internal mammary  lymph node. This is not significantly changed from comparison MR  performed March 2014.  Ancillary findings: None.  IMPRESSION:  Status post bilateral  mastectomies without MR findings of recurrent  malignancy.  RECOMMENDATION:  Continued clinical management.  BI-RADS CATEGORY 2: Benign.  Electronically Signed  By: AAndres Shad On: 05/08/2013 13:01    ASSESSMENT:  1. Right sided breast cancer,GRADE II, 1.2 cm invasive ductal carcinoma, ER +99%, PR +72%, grade 2, LV I positive, Ki-67 marker high at 60%, but 4 sentinel lymph nodes negative for metastatic disease. HER-2/neu nonamplified. S/P Bilateral mastectomy on 05/08/2012 by Dr. COsborn Coho Her Oncotype DX score was 14 giving her recurrence rate of 9% at 5 years of hormonal therapy. With that in mind she was not considered a candidate for sytemic chemotherapy. Now on Aromasin beginning on  06/03/2012 and she will take this lifelong due to her PALB2 And CHEK 2 positivity. S/P bilateral salpingo-oophorectomy on 01/30/2013. 2. PALB2 + mutation  3. CHEK2 + mutation  4. left-sided breast cancer 1996, treated with lumpectomy followed by chemotherapy consisting of CMF for 8 cycles. followed by radiation therapy. She was not given tamoxifen. There was some question of Megace one tablet a day but she is not sure of the duration. This may have been for hot flashes which she remembers were severe. She was eventually placed on Effexor for hot flashes but is now on the Effexor for depression.  5. Excess weight  6. History of a popliteal cyst  7. Vitamin D deficiency on replacement  8. Osteopenia, next bone density due in April 2016. On Ca++ and Vit D. 9. Left lateral antecubital rash, likely secondary to mosquito bite.  Patient Active Problem List   Diagnosis Date Noted  . Acquired trigger finger 05/06/2013  . Abnormal genetic test 04/09/2013  . Osteopenia 12/11/2012  . Thyroid nodule 06/21/2012  . GERD (gastroesophageal reflux disease) 06/03/2012  . Adenomatous polyps 06/03/2012  . Breast cancer, right breast, IDC, Multifocal 03/12/2012  . Popliteal cyst 08/18/2010  . Hx Left breast cancer, UOQ,  IDC, receptor negative 11/08/1993    PLAN:  1. I personally reviewed and went over laboratory results with the patient.  The results are noted within this dictation. 2. I personally reviewed and went over radiographic studies with the patient.  The results are noted within this dictation.   3. Next bone density is due in April 2016. 4. Reviewed NCCN guidelines pertaining to surveillance.  5. Continue Aromasin daily. She will require this lifelong due to increased risk of recurrence with PALB2 and CHEK2 mutations  6. CT of chest in 6 months 7. Labs today: CBC diff, CMET 8. Labs in 6 months: CBC diff, CMET, CA 27.29 9. Recommend OTC Hydrocortisone to rash of left antecubital area. 10. Return in 6 months for follow-up.   THERAPY PLAN:  Encouraged compliance with Aromasin which we recommend at this time she continue lifelong due to [place] and CHEK2 positivity. We will follow NCCN guidelines pertaining to surveillance.  NCCN guidelines recommends the following surveillance for invasive breast cancer:  A. History and Physical exam every 4-6 months for 5 years and then every 12 months.  B. Mammography every 12 months  C. Women on Tamoxifen: annual gynecologic assessment every 12 months if uterus is present.  D. Women on aromatase inhibitor or who experience ovarian failure secondary to treatment should have monitoring of bone health with a bone mineral density determination at baseline and periodically thereafter.  E. Assess and encourage adherence to adjuvant endocrine therapy.  F. Evidence suggests that active lifestyle and achieving and maintaining an ideal body weight (20-25 BMI) may lead to optimal breast cancer outcomes.  All questions were answered. The patient knows to call the clinic with any problems, questions or concerns. We can certainly see the patient much sooner if necessary.  Patient and plan discussed with Dr. Farrel Gobble and he is in agreement with the aforementioned.    KEFALAS,THOMAS 06/12/2013

## 2013-06-12 ENCOUNTER — Encounter (HOSPITAL_COMMUNITY): Payer: Self-pay | Admitting: Oncology

## 2013-06-12 ENCOUNTER — Encounter (HOSPITAL_COMMUNITY): Payer: Medicare Other | Attending: Oncology | Admitting: Oncology

## 2013-06-12 VITALS — BP 159/81 | HR 81 | Temp 98.4°F | Resp 18 | Wt 167.4 lb

## 2013-06-12 DIAGNOSIS — E559 Vitamin D deficiency, unspecified: Secondary | ICD-10-CM

## 2013-06-12 DIAGNOSIS — F3289 Other specified depressive episodes: Secondary | ICD-10-CM | POA: Insufficient documentation

## 2013-06-12 DIAGNOSIS — Z853 Personal history of malignant neoplasm of breast: Secondary | ICD-10-CM

## 2013-06-12 DIAGNOSIS — K219 Gastro-esophageal reflux disease without esophagitis: Secondary | ICD-10-CM | POA: Insufficient documentation

## 2013-06-12 DIAGNOSIS — Z9079 Acquired absence of other genital organ(s): Secondary | ICD-10-CM | POA: Insufficient documentation

## 2013-06-12 DIAGNOSIS — Z9221 Personal history of antineoplastic chemotherapy: Secondary | ICD-10-CM | POA: Insufficient documentation

## 2013-06-12 DIAGNOSIS — C50419 Malignant neoplasm of upper-outer quadrant of unspecified female breast: Secondary | ICD-10-CM

## 2013-06-12 DIAGNOSIS — R21 Rash and other nonspecific skin eruption: Secondary | ICD-10-CM | POA: Insufficient documentation

## 2013-06-12 DIAGNOSIS — C50919 Malignant neoplasm of unspecified site of unspecified female breast: Secondary | ICD-10-CM | POA: Insufficient documentation

## 2013-06-12 DIAGNOSIS — F329 Major depressive disorder, single episode, unspecified: Secondary | ICD-10-CM | POA: Insufficient documentation

## 2013-06-12 DIAGNOSIS — C50911 Malignant neoplasm of unspecified site of right female breast: Secondary | ICD-10-CM

## 2013-06-12 DIAGNOSIS — R898 Other abnormal findings in specimens from other organs, systems and tissues: Secondary | ICD-10-CM

## 2013-06-12 DIAGNOSIS — Z923 Personal history of irradiation: Secondary | ICD-10-CM | POA: Insufficient documentation

## 2013-06-12 DIAGNOSIS — M899 Disorder of bone, unspecified: Secondary | ICD-10-CM

## 2013-06-12 DIAGNOSIS — Z90710 Acquired absence of both cervix and uterus: Secondary | ICD-10-CM | POA: Insufficient documentation

## 2013-06-12 DIAGNOSIS — Z9089 Acquired absence of other organs: Secondary | ICD-10-CM | POA: Insufficient documentation

## 2013-06-12 DIAGNOSIS — Z9010 Acquired absence of unspecified breast and nipple: Secondary | ICD-10-CM | POA: Insufficient documentation

## 2013-06-12 DIAGNOSIS — M949 Disorder of cartilage, unspecified: Secondary | ICD-10-CM

## 2013-06-12 DIAGNOSIS — Z170 Estrogen receptor positive status [ER+]: Secondary | ICD-10-CM

## 2013-06-12 LAB — CBC WITH DIFFERENTIAL/PLATELET
BASOS ABS: 0 10*3/uL (ref 0.0–0.1)
Basophils Relative: 0 % (ref 0–1)
Eosinophils Absolute: 0.1 10*3/uL (ref 0.0–0.7)
Eosinophils Relative: 1 % (ref 0–5)
HCT: 37.4 % (ref 36.0–46.0)
HEMOGLOBIN: 12.5 g/dL (ref 12.0–15.0)
LYMPHS PCT: 39 % (ref 12–46)
Lymphs Abs: 3 10*3/uL (ref 0.7–4.0)
MCH: 31.2 pg (ref 26.0–34.0)
MCHC: 33.4 g/dL (ref 30.0–36.0)
MCV: 93.3 fL (ref 78.0–100.0)
MONO ABS: 0.3 10*3/uL (ref 0.1–1.0)
Monocytes Relative: 4 % (ref 3–12)
NEUTROS ABS: 4.3 10*3/uL (ref 1.7–7.7)
Neutrophils Relative %: 56 % (ref 43–77)
Platelets: 300 10*3/uL (ref 150–400)
RBC: 4.01 MIL/uL (ref 3.87–5.11)
RDW: 12.6 % (ref 11.5–15.5)
WBC: 7.7 10*3/uL (ref 4.0–10.5)

## 2013-06-12 LAB — COMPREHENSIVE METABOLIC PANEL
ALK PHOS: 61 U/L (ref 39–117)
ALT: 25 U/L (ref 0–35)
AST: 21 U/L (ref 0–37)
Albumin: 4.1 g/dL (ref 3.5–5.2)
BILIRUBIN TOTAL: 0.5 mg/dL (ref 0.3–1.2)
BUN: 19 mg/dL (ref 6–23)
CHLORIDE: 100 meq/L (ref 96–112)
CO2: 26 meq/L (ref 19–32)
CREATININE: 0.9 mg/dL (ref 0.50–1.10)
Calcium: 9.7 mg/dL (ref 8.4–10.5)
GFR calc Af Amer: 76 mL/min — ABNORMAL LOW (ref >90)
GFR, EST NON AFRICAN AMERICAN: 65 mL/min — AB (ref >90)
Glucose, Bld: 109 mg/dL — ABNORMAL HIGH (ref 70–99)
POTASSIUM: 4.2 meq/L (ref 3.7–5.3)
Sodium: 139 mEq/L (ref 137–147)
Total Protein: 7.7 g/dL (ref 6.0–8.3)

## 2013-06-12 NOTE — Progress Notes (Signed)
Amy Houston's reason for visit today is for labs as scheduled per MD orders.  Venipuncture performed with a 23 gauge butterfly needle to R Antecubital.  Amy Ramus Munley tolerated procedure well and without incident; questions were answered and patient was discharged.

## 2013-06-12 NOTE — Patient Instructions (Signed)
[facility] Discharge Instructions  RECOMMENDATIONS MADE BY THE CONSULTANT AND ANY TEST RESULTS WILL BE SENT TO YOUR REFERRING PHYSICIAN.  We will see you in 6 months for labs and doctor's appointment.   Ct scan in 6 months.  Thank you for choosing [facility] to provide your oncology and hematology care.  To afford each patient quality time with our providers, please arrive at least 15 minutes before your scheduled appointment time.  With your help, our goal is to use those 15 minutes to complete the necessary work-up to ensure our physicians have the information they need to help with your evaluation and healthcare recommendations.    Effective January 1st, 2014, we ask that you re-schedule your appointment with our physicians should you arrive 10 or more minutes late for your appointment.  We strive to give you quality time with our providers, and arriving late affects you and other patients whose appointments are after yours.    Again, thank you for choosing [facility].  Our hope is that these requests will decrease the amount of time that you wait before being seen by our physicians.       _____________________________________________________________  Should you have questions after your visit to [facility], please contact our office at [phone] between the hours of 8:30 a.m. and 5:00 p.m.  Voicemails left after 4:30 p.m. will not be returned until the following business day.  For prescription refill requests, have your pharmacy contact our office with your prescription refill request.

## 2013-06-13 ENCOUNTER — Ambulatory Visit (HOSPITAL_COMMUNITY): Payer: Medicare Other | Admitting: Oncology

## 2013-06-16 ENCOUNTER — Ambulatory Visit (HOSPITAL_COMMUNITY): Payer: Medicare Other | Admitting: Oncology

## 2013-07-04 ENCOUNTER — Other Ambulatory Visit: Payer: Self-pay | Admitting: Gastroenterology

## 2013-07-04 ENCOUNTER — Other Ambulatory Visit (HOSPITAL_COMMUNITY): Payer: Self-pay | Admitting: Oncology

## 2013-11-10 ENCOUNTER — Other Ambulatory Visit (HOSPITAL_COMMUNITY): Payer: Self-pay | Admitting: Obstetrics and Gynecology

## 2013-11-10 DIAGNOSIS — R140 Abdominal distension (gaseous): Secondary | ICD-10-CM

## 2013-11-13 ENCOUNTER — Ambulatory Visit (HOSPITAL_COMMUNITY)
Admission: RE | Admit: 2013-11-13 | Discharge: 2013-11-13 | Disposition: A | Discharge status: Home / Self Care | Payer: Medicare Other | Source: Ambulatory Visit | Attending: Obstetrics and Gynecology | Admitting: Obstetrics and Gynecology

## 2013-11-13 DIAGNOSIS — R140 Abdominal distension (gaseous): Secondary | ICD-10-CM | POA: Insufficient documentation

## 2013-11-13 LAB — POCT I-STAT CREATININE: Creatinine, Ser: 1 mg/dL (ref 0.50–1.10)

## 2013-11-13 MED ORDER — IOHEXOL 300 MG/ML  SOLN
100.0000 mL | Freq: Once | INTRAMUSCULAR | Status: AC | PRN
  Administered 2013-11-13: 100 mL via INTRAVENOUS

## 2013-11-14 ENCOUNTER — Ambulatory Visit (HOSPITAL_COMMUNITY): Payer: Medicare Other

## 2013-11-21 ENCOUNTER — Emergency Department (HOSPITAL_COMMUNITY)
Admission: EM | Admit: 2013-11-21 | Discharge: 2013-11-21 | Disposition: A | Discharge status: Home / Self Care | Payer: Medicare Other | Attending: Emergency Medicine | Admitting: Emergency Medicine

## 2013-11-21 ENCOUNTER — Encounter (HOSPITAL_COMMUNITY): Payer: Self-pay | Admitting: Emergency Medicine

## 2013-11-21 ENCOUNTER — Emergency Department (HOSPITAL_COMMUNITY): Payer: Medicare Other

## 2013-11-21 DIAGNOSIS — S80211A Abrasion, right knee, initial encounter: Secondary | ICD-10-CM | POA: Insufficient documentation

## 2013-11-21 DIAGNOSIS — M1990 Unspecified osteoarthritis, unspecified site: Secondary | ICD-10-CM | POA: Insufficient documentation

## 2013-11-21 DIAGNOSIS — W19XXXA Unspecified fall, initial encounter: Secondary | ICD-10-CM | POA: Diagnosis not present

## 2013-11-21 DIAGNOSIS — Z79899 Other long term (current) drug therapy: Secondary | ICD-10-CM | POA: Insufficient documentation

## 2013-11-21 DIAGNOSIS — G4730 Sleep apnea, unspecified: Secondary | ICD-10-CM | POA: Diagnosis not present

## 2013-11-21 DIAGNOSIS — K589 Irritable bowel syndrome without diarrhea: Secondary | ICD-10-CM | POA: Diagnosis not present

## 2013-11-21 DIAGNOSIS — E119 Type 2 diabetes mellitus without complications: Secondary | ICD-10-CM | POA: Diagnosis not present

## 2013-11-21 DIAGNOSIS — Z880 Allergy status to penicillin: Secondary | ICD-10-CM | POA: Diagnosis not present

## 2013-11-21 DIAGNOSIS — M8580 Other specified disorders of bone density and structure, unspecified site: Secondary | ICD-10-CM | POA: Insufficient documentation

## 2013-11-21 DIAGNOSIS — S3991XA Unspecified injury of abdomen, initial encounter: Secondary | ICD-10-CM | POA: Diagnosis not present

## 2013-11-21 DIAGNOSIS — I10 Essential (primary) hypertension: Secondary | ICD-10-CM | POA: Diagnosis not present

## 2013-11-21 DIAGNOSIS — S99922A Unspecified injury of left foot, initial encounter: Secondary | ICD-10-CM | POA: Diagnosis present

## 2013-11-21 DIAGNOSIS — S93602A Unspecified sprain of left foot, initial encounter: Secondary | ICD-10-CM | POA: Diagnosis not present

## 2013-11-21 DIAGNOSIS — Y9289 Other specified places as the place of occurrence of the external cause: Secondary | ICD-10-CM | POA: Diagnosis not present

## 2013-11-21 DIAGNOSIS — T1490XA Injury, unspecified, initial encounter: Secondary | ICD-10-CM

## 2013-11-21 DIAGNOSIS — Y998 Other external cause status: Secondary | ICD-10-CM | POA: Diagnosis not present

## 2013-11-21 DIAGNOSIS — S80212A Abrasion, left knee, initial encounter: Secondary | ICD-10-CM | POA: Diagnosis not present

## 2013-11-21 DIAGNOSIS — Y9301 Activity, walking, marching and hiking: Secondary | ICD-10-CM | POA: Insufficient documentation

## 2013-11-21 DIAGNOSIS — Z853 Personal history of malignant neoplasm of breast: Secondary | ICD-10-CM | POA: Insufficient documentation

## 2013-11-21 DIAGNOSIS — Z23 Encounter for immunization: Secondary | ICD-10-CM | POA: Insufficient documentation

## 2013-11-21 DIAGNOSIS — T07XXXA Unspecified multiple injuries, initial encounter: Secondary | ICD-10-CM

## 2013-11-21 DIAGNOSIS — K219 Gastro-esophageal reflux disease without esophagitis: Secondary | ICD-10-CM | POA: Diagnosis not present

## 2013-11-21 MED ORDER — HYDROCODONE-ACETAMINOPHEN 5-325 MG PO TABS
1.0000 | ORAL_TABLET | Freq: Once | ORAL | Status: AC
  Administered 2013-11-21: 1 via ORAL
  Filled 2013-11-21: qty 1

## 2013-11-21 MED ORDER — TETANUS-DIPHTH-ACELL PERTUSSIS 5-2.5-18.5 LF-MCG/0.5 IM SUSP
0.5000 mL | Freq: Once | INTRAMUSCULAR | Status: AC
  Administered 2013-11-21: 0.5 mL via INTRAMUSCULAR
  Filled 2013-11-21: qty 0.5

## 2013-11-21 MED ORDER — DOUBLE ANTIBIOTIC 500-10000 UNIT/GM EX OINT
TOPICAL_OINTMENT | Freq: Once | CUTANEOUS | Status: AC
  Administered 2013-11-21: 12:00:00 via TOPICAL
  Filled 2013-11-21: qty 1

## 2013-11-21 MED ORDER — ONDANSETRON HCL 4 MG PO TABS
4.0000 mg | ORAL_TABLET | Freq: Once | ORAL | Status: AC
  Administered 2013-11-21: 4 mg via ORAL
  Filled 2013-11-21: qty 1

## 2013-11-21 NOTE — Discharge Instructions (Signed)
Please keep your foot elevated above your waist. Please apply ice, use the postoperative shoe until you can safely wear your regular shoes. Your x-ray is negative for fracture or dislocation. Please see Dr. Luan Pulling for additional evaluation and management if not improving. Foot Sprain The muscles and cord like structures which attach muscle to bone (tendons) that surround the feet are made up of units. A foot sprain can occur at the weakest spot in any of these units. This condition is most often caused by injury to or overuse of the foot, as from playing contact sports, or aggravating a previous injury, or from poor conditioning, or obesity. SYMPTOMS  Pain with movement of the foot.  Tenderness and swelling at the injury site.  Loss of strength is present in moderate or severe sprains. THE THREE GRADES OR SEVERITY OF FOOT SPRAIN ARE:  Mild (Grade I): Slightly pulled muscle without tearing of muscle or tendon fibers or loss of strength.  Moderate (Grade II): Tearing of fibers in a muscle, tendon, or at the attachment to bone, with small decrease in strength.  Severe (Grade III): Rupture of the muscle-tendon-bone attachment, with separation of fibers. Severe sprain requires surgical repair. Often repeating (chronic) sprains are caused by overuse. Sudden (acute) sprains are caused by direct injury or over-use. DIAGNOSIS  Diagnosis of this condition is usually by your own observation. If problems continue, a caregiver may be required for further evaluation and treatment. X-rays may be required to make sure there are not breaks in the bones (fractures) present. Continued problems may require physical therapy for treatment. PREVENTION  Use strength and conditioning exercises appropriate for your sport.  Warm up properly prior to working out.  Use athletic shoes that are made for the sport you are participating in.  Allow adequate time for healing. Early return to activities makes repeat injury  more likely, and can lead to an unstable arthritic foot that can result in prolonged disability. Mild sprains generally heal in 3 to 10 days, with moderate and severe sprains taking 2 to 10 weeks. Your caregiver can help you determine the proper time required for healing. HOME CARE INSTRUCTIONS   Apply ice to the injury for 15-20 minutes, 03-04 times per day. Put the ice in a plastic bag and place a towel between the bag of ice and your skin.  An elastic wrap (like an Ace bandage) may be used to keep swelling down.  Keep foot above the level of the heart, or at least raised on a footstool, when swelling and pain are present.  Try to avoid use other than gentle range of motion while the foot is painful. Do not resume use until instructed by your caregiver. Then begin use gradually, not increasing use to the point of pain. If pain does develop, decrease use and continue the above measures, gradually increasing activities that do not cause discomfort, until you gradually achieve normal use.  Use crutches if and as instructed, and for the length of time instructed.  Keep injured foot and ankle wrapped between treatments.  Massage foot and ankle for comfort and to keep swelling down. Massage from the toes up towards the knee.  Only take over-the-counter or prescription medicines for pain, discomfort, or fever as directed by your caregiver. SEEK IMMEDIATE MEDICAL CARE IF:   Your pain and swelling increase, or pain is not controlled with medications.  You have loss of feeling in your foot or your foot turns cold or blue.  You develop new, unexplained  symptoms, or an increase of the symptoms that brought you to your caregiver. MAKE SURE YOU:   Understand these instructions.  Will watch your condition.  Will get help right away if you are not doing well or get worse. Document Released: 06/10/2001 Document Revised: 03/13/2011 Document Reviewed: 08/08/2007 [facility] Patient Information 2015  [place], [place]. This information is not intended to replace advice given to you by your health care provider. Make sure you discuss any questions you have with your health care provider.  Abrasions An abrasion is a cut or scrape of the skin. Abrasions do not go through all layers of the skin. HOME CARE  If a bandage (dressing) was put on your wound, change it as told by your doctor. If the bandage sticks, soak it off with warm.  Wash the area with water and soap 2 times a day. Rinse off the soap. Pat the area dry with a clean towel.  Put on medicated cream (ointment) as told by your doctor.  Change your bandage right away if it gets wet or dirty.  Only take medicine as told by your doctor.  See your doctor within 24-48 hours to get your wound checked.  Check your wound for redness, puffiness (swelling), or yellowish-white fluid (pus). GET HELP RIGHT AWAY IF:   You have more pain in the wound.  You have redness, swelling, or tenderness around the wound.  You have pus coming from the wound.  You have a fever or lasting symptoms for more than 2-3 days.  You have a fever and your symptoms suddenly get worse.  You have a bad smell coming from the wound or bandage. MAKE SURE YOU:   Understand these instructions.  Will watch your condition.  Will get help right away if you are not doing well or get worse. Document Released: 06/07/2007 Document Revised: 09/13/2011 Document Reviewed: 11/22/2010 [facility] Patient Information 2015 [place], [place]. This information is not intended to replace advice given to you by your health care provider. Make sure you discuss any questions you have with your health care provider.

## 2013-11-21 NOTE — ED Notes (Addendum)
Patient states she fell last night injuring her left foot. Patient has swelling and bruising noted to left big toe and foot area. Patient stated she took two tylenol prior to arrival to ED today.

## 2013-11-21 NOTE — ED Provider Notes (Signed)
CSN: [id number]     Arrival date & time 11/21/13  6808 History   First MD Initiated Contact with Patient 11/21/13 [phone]     Chief Complaint  Patient presents with  . Foot Injury     (Consider location/radiation/quality/duration/timing/severity/associated sxs/prior Treatment) HPI Comments: [place] last night and injured left foot and skinned both knees.  Patient is a 68 y.o. female presenting with foot injury. The history is provided by the patient.  Foot Injury Location:  Foot Time since incident:  1 day Injury: yes   Mechanism of injury: fall   Fall:    Fall occurred:  Walking   Point of impact:  Feet   Entrapped after fall: no   Foot location:  L foot Pain details:    Radiates to:  Does not radiate   Severity:  Severe   Onset quality:  Sudden   Duration:  1 day   Timing:  Intermittent   Progression:  Worsening Chronicity:  New Dislocation: no   Foreign body present:  No foreign bodies Prior injury to area:  No Relieved by:  Nothing Worsened by:  Rotation and bearing weight Ineffective treatments:  Rest Associated symptoms: itching and swelling   Associated symptoms: no back pain and no neck pain   Risk factors: no frequent fractures and no known bone disorder     Past Medical History  Diagnosis Date  . HTN (hypertension)   . GERD (gastroesophageal reflux disease)     tums  . IBS (irritable bowel syndrome)   . Hemorrhoid   . History of blood transfusion     post child birth  . PPD positive, treated 1999  . Sleep apnea     "gone after I had OR" (05/08/2012)  . Diabetes mellitus 1999    "dx; never on meds" (05/08/2012)  . H/O hiatal hernia   . Arthritis     "of the spine" (05/08/2012)  . Breast cancer 1995    "left" (05/08/2012)  . Breast cancer, right breast, IDC, Multifocal 03/12/2012    Multifocal, 10 and 8 left breast   . Osteopenia 12/11/2012    On Ca++ and Vit D. Bone density in April 2014.  Next bone density due in April 2016.  Marland Kitchen PONV (postoperative nausea  and vomiting)   . Exertional shortness of breath   . Abnormal genetic test 04/09/2013    PALB2 and CHEK2 positivity    Past Surgical History  Procedure Laterality Date  . Tubal ligation  1976  . Mastectomy complete / simple w/ sentinel node biopsy Right 05/08/2012  . Mastectomy complete / simple Left 05/08/2012  . Uvulopalatopharyngoplasty, tonsillectomy and septoplasty  ~ 2008  . Cholecystectomy  1992  . Dilation and curettage of uterus  1968  . Tonsillectomy and adenoidectomy  ~ 2008  . Breast lumpectomy Left 1995  . Breast biopsy Left 1995  . Breast biopsy Bilateral 03/2012    "one on the left; 2 on the right" (05/08/2012)  . Simple mastectomy with axillary sentinel node biopsy Right 05/08/2012    Procedure: RIGHT TOTAL MASTECTOMY WITH AXILLARY SENTINEL NODE BIOPSY;  Surgeon: Haywood Lasso, MD;  Location: [facility];  Service: General;  Laterality: Right;  . Simple mastectomy with axillary sentinel node biopsy Left 05/08/2012    Procedure: LEFT TOTAL MASTECTOMY;  Surgeon: Haywood Lasso, MD;  Location: [facility];  Service: General;  Laterality: Left;  . Colonoscopy  12/06/93    Dr. Hayes:single diminutive polyp of the descending colon/extrernal hemorrhoids, benign path  .  Colonoscopy  11/30/98    Dr Hayes:small rectal polyp/internal hemorrhoids, benign path  . Colonoscopy  11/26/2006    Dr. Hayes:sigmoid polyp/small interal hemorrhoids, adenomatous  . Esophagogastroduodenoscopy  1994    Dr. Cristina Gong: small hiatal hernia and Schatzki's ring widely patent, CLO test negative  . Colonoscopy with esophagogastroduodenoscopy (egd) N/A 07/01/2012    Procedure: COLONOSCOPY WITH ESOPHAGOGASTRODUODENOSCOPY (EGD);  Surgeon: Danie Binder, MD;  [facility];  Service: Endoscopy;  Laterality: N/A;  12:30-rescheduled to [place] notified pt  . Esophageal dilation  07/01/2012    Procedure: ESOPHAGEAL DILATION;  Surgeon: Danie Binder, MD;  [facility];  Service: Endoscopy;;  .  Esophagogastroduodenoscopy (egd) with esophageal dilation N/A 07/23/2012    Procedure: ESOPHAGOGASTRODUODENOSCOPY (EGD) WITH ESOPHAGEAL DILATION;  Surgeon: Danie Binder, MD;  [facility];  Service: Endoscopy;  Laterality: N/A;  8:45  . Salpingoophorectomy Bilateral 01/30/2013    Procedure: SALPINGO OOPHORECTOMY;  Surgeon: Melina Schools, MD;  Location: [facility];  Service: Gynecology;  Laterality: Bilateral;  . Abdominal hysterectomy Bilateral 01/30/2013    Procedure: HYSTERECTOMY ABDOMINAL;  Surgeon: Melina Schools, MD;  Location: [facility];  Service: Gynecology;  Laterality: Bilateral;   Family History  Problem Relation Age of Onset  . Lung cancer Mother 74    smoker  . Leukemia Father 26    Hairy Cell at 40, CLL at 24  . Lymphoma Father 10    Hodgkins lymphoma  . Lung cancer Sister 56  . Thyroid cancer Brother 46    Medullary  . Lymphoma Brother 60    Follicular lymphoma  . Breast cancer Maternal Aunt     mother's paternal half sister  . Lung cancer Maternal Uncle     mother's paternal half brother  . Lung cancer Maternal Grandmother     smoker  . Cancer Paternal Uncle     oral cancer dx in his 69s  . Other Daughter     leiomyoma of esophagus  . Colon cancer Maternal Aunt     mother's maternal half sister  . Lung cancer Cousin     paternal cousin  . Breast cancer Cousin     paternal cousin died in her 73s   History  Substance Use Topics  . Smoking status: Never Smoker   . Smokeless tobacco: Never Used  . Alcohol Use: Yes     Comment: 05/08/2012 "margarita or glass of wine once or twice/yr"   OB History    No data available     Review of Systems  Constitutional: Negative for activity change.       All ROS Neg except as noted in HPI  HENT: Negative for nosebleeds.   Eyes: Negative for photophobia and discharge.  Respiratory: Negative for cough, shortness of breath and wheezing.   Cardiovascular: Negative for chest pain and palpitations.  Gastrointestinal:  Positive for abdominal pain. Negative for blood in stool.  Genitourinary: Negative for dysuria, frequency and hematuria.  Musculoskeletal: Positive for arthralgias. Negative for back pain and neck pain.  Skin: Positive for itching.  Neurological: Negative for dizziness, seizures and speech difficulty.  Psychiatric/Behavioral: Negative for hallucinations and confusion.      Allergies  Penicillins  Home Medications   Prior to Admission medications   Medication Sig Start Date End Date Taking? Authorizing Provider  Calcium Carbonate-Vitamin D (CALCIUM 600+D3 PO) Take 1 tablet by mouth 2 (two) times daily.   Yes Historical Provider, MD  cholecalciferol (VITAMIN D) 1000  UNITS tablet Take 1,000 Units by mouth 2 (two) times daily.    Yes Historical Provider, MD  exemestane (AROMASIN) 25 MG tablet TAKE (1) TABLET ONCE DAILY IN THE MORNING, AFTER BREAKFAST. 07/04/13  Yes Manon Hilding Kefalas, PA-C  lisinopril (PRINIVIL,ZESTRIL) 20 MG tablet Take 20 mg by mouth daily.   Yes Historical Provider, MD  metoprolol succinate (TOPROL-XL) 25 MG 24 hr tablet Take 25 mg by mouth daily.   Yes Historical Provider, MD  Multiple Vitamin (MULTIVITAMIN WITH MINERALS) TABS Take 1 tablet by mouth daily.   Yes Historical Provider, MD  omeprazole (PRILOSEC) 20 MG capsule TAKE ONE CAPSULE DAILY.   Yes Mahala Menghini, PA-C  oxybutynin (DITROPAN-XL) 5 MG 24 hr tablet Take 5 mg by mouth at bedtime. 10/27/13  Yes Historical Provider, MD  oxyCODONE-acetaminophen (PERCOCET/ROXICET) 5-325 MG per tablet Take 1 tablet by mouth every 6 (six) hours as needed (pain).   Yes Historical Provider, MD  venlafaxine (EFFEXOR) 50 MG tablet Take 50 mg by mouth 2 (two) times daily.   Yes Historical Provider, MD  docusate sodium 100 MG CAPS Take 100 mg by mouth 2 (two) times daily. Patient not taking: Reported on 11/21/2013 02/01/13   Melina Schools, MD   BP 156/56 mmHg  Pulse 79  Temp(Src) 98.3 F (36.8 C) (Oral)  Resp 16  Ht 5' (1.524 m)   Wt 165 lb (74.844 kg)  BMI 32.22 kg/m2  SpO2 96% Physical Exam  Constitutional: She is oriented to person, place, and time. She appears well-developed and well-nourished.  Non-toxic appearance.  HENT:  Head: Normocephalic.  Right Ear: Tympanic membrane and external ear normal.  Left Ear: Tympanic membrane and external ear normal.  Eyes: EOM and lids are normal. Pupils are equal, round, and reactive to light.  Neck: Normal range of motion. Neck supple. Carotid bruit is not present.  Cardiovascular: Normal rate, regular rhythm, normal heart sounds, intact distal pulses and normal pulses.   Pulmonary/Chest: Breath sounds normal. No respiratory distress.  Abdominal: Soft. Bowel sounds are normal. There is no tenderness. There is no guarding.  Musculoskeletal: Normal range of motion.  Lymphadenopathy:       Head (right side): No submandibular adenopathy present.       Head (left side): No submandibular adenopathy present.    She has no cervical adenopathy.  Neurological: She is alert and oriented to person, place, and time. She has normal strength. No cranial nerve deficit or sensory deficit.  Skin: Skin is warm and dry.  Psychiatric: She has a normal mood and affect. Her speech is normal.  Nursing note and vitals reviewed.   ED Course  Procedures (including critical care time) Labs Review Labs Reviewed - No data to display  Imaging Review Dg Foot Complete Left  11/21/2013   CLINICAL DATA:  Pain and swelling and bruising to the plantar aspect of the foot after a fall last night.  EXAM: LEFT FOOT - COMPLETE 3+ VIEW  COMPARISON:  None.  FINDINGS: There is no fracture or dislocation. There are arthritic changes of the first metatarsal phalangeal joint with soft tissue calcifications both medially and laterally. There is soft tissue swelling over the medial aspect of the first metatarsal phalangeal joint.  Minimal plantar and posterior calcaneal spur. Slight dorsal spurring on the distal  talus.  IMPRESSION: No acute osseous abnormality. Arthritic changes of the first metatarsal phalangeal joint with soft tissue swelling.   Electronically Signed   By: Rozetta Nunnery M.D.   On: 11/21/2013  10:14     EKG Interpretation None      MDM  Xray of the left foot is negative for fx or dislocation. Suspect sprain of the left foot. Pt fitted with splint and post op shoe. Pt to follow up with orthopedics if not improving.   Final diagnoses:  Foot sprain, left, initial encounter  Abrasions of multiple sites    *I have reviewed nursing notes, vital signs, and all appropriate lab and imaging results for this patient.**    Lenox Ahr, PA-C 11/22/13 [place], DO 11/24/13 [phone]

## 2013-12-11 ENCOUNTER — Other Ambulatory Visit (HOSPITAL_COMMUNITY): Payer: Self-pay

## 2013-12-12 ENCOUNTER — Ambulatory Visit (HOSPITAL_COMMUNITY)
Admission: RE | Admit: 2013-12-12 | Discharge: 2013-12-12 | Disposition: A | Discharge status: Home / Self Care | Payer: Medicare Other | Source: Ambulatory Visit | Attending: Oncology | Admitting: Oncology

## 2013-12-12 DIAGNOSIS — Z853 Personal history of malignant neoplasm of breast: Secondary | ICD-10-CM

## 2013-12-12 DIAGNOSIS — C50911 Malignant neoplasm of unspecified site of right female breast: Secondary | ICD-10-CM

## 2013-12-12 DIAGNOSIS — R918 Other nonspecific abnormal finding of lung field: Secondary | ICD-10-CM

## 2013-12-14 NOTE — Progress Notes (Signed)
Amy Bogus, MD [address] [place] [address]  Breast cancer, right breast, IDC, Multifocal - Plan: Vitamin D 25 hydroxy, CBC with Differential, Comprehensive metabolic panel, TSH, Vitamin D 25 hydroxy, TSH, Vitamin D 25 hydroxy, MR Breast Bilateral Wo Contrast  Hx Left breast cancer, UOQ, IDC, receptor negative - Plan: CBC with Differential, Comprehensive metabolic panel, MR Breast Bilateral Wo Contrast  Abnormal genetic test - Plan: MR Breast Bilateral Wo Contrast  CURRENT THERAPY: Aromasin daily beginning on 06/03/2012 and she will take this lifelong due to increased recurrence rate with PALB2 and CHEK2 positivity.  INTERVAL HISTORY: Amy Houston 68 y.o. female returns for  regular  visit for followup of right sided breast cancer,GRADE II, 1.2 cm invasive ductal carcinoma, ER +99%, PR +72%, grade 2, LV I positive, Ki-67 marker high at 60%, but 4 sentinel lymph nodes negative for metastatic disease. HER-2/neu nonamplified. S/P Bilateral mastectomy on 05/08/2012 by Dr. Osborn Houston. Her Oncotype DX score was 14 giving her recurrence rate of 9% at 5 years of hormonal therapy. With that in mind she was not considered a candidate for sytemic chemotherapy. Now on Aromasin beginning on 06/03/2012 and she will take this lifelong due to her PALB2 And CHEK 2 positivity. Additionally, she is S/P bilateral salpingo-oophorectomy on 01/30/2013. 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 followed by radiation therapy. She was not given tamoxifen. There was some question of Megace one tablet a day but she is not sure of the duration. This may have been for hot flashes which she remembers were severe. She was eventually placed on Effexor for hot flashes but is now on the Effexor for depression.     Breast cancer,  right breast, IDC, Multifocal   03/19/2012 Initial Diagnosis Breast cancer, right breast, IDC, Multifocal   05/08/2012 Surgery Bilateral simple mastectomies by Dr. Margot Houston   06/03/2012 -  Chemotherapy Aromatase inhibitor.  PALB2 and CHEK2+ and therefore would benefit from lifelong AI therapy.   01/30/2013 Surgery Laparotomy, minimal lysis of adhesions, abdominal hysterectomy, bilateral salpingo-oophorectomy.   I personally reviewed and went over laboratory results with the patient.  The results are noted within this dictation.  I personally reviewed and went over radiographic studies with the patient.  The results are noted within this dictation.  CT chest on 12/12/2013 demonstrates:  1. Status post bilateral modified radical mastectomy and bilateral axillary nodal dissection, with no evidence to suggest local recurrence of disease or definitive findings to suggest metastatic disease in the lungs at this time. 2. All previously noted pulmonary nodules appear unchanged in size, number and distribution and are favored to be benign. 3. Atherosclerosis, including left main and 3 vessel coronary artery disease. Please note that although the presence of coronary artery calcium documents the presence of coronary artery disease, the severity of this disease and any potential stenosis cannot be assessed on this non-gated CT examination. Assessment for potential risk factor modification, dietary therapy or pharmacologic therapy may be warranted, if clinically indicated. 4. Mild hepatic steatosis.  Chart is reviewed.  She presented to the ED on 11/21/2013 for a foot injury after falling.  Xrays were performed and were negative for fracture or dislocation. Additionally, her CT of chest was fine and we will therefore repeat in one year's time. She is due for bone density  exam and this is going to be scheduled for April 2016.  Her previous bone density shows osteopenia and therefore she be a candidate for  Prolia which we will run through her insurance and see what her cost will be first. She will continue with calcium and vitamin D supplementation.  She is agreeable to this plan. This is an important part of her preventive health maintenance because we anticipate she will be on aromatase inhibitor lifelong due to her genetic abnormalities and therefore we should be treating her osteopenia aggressively to prevent osteoporosis.  Oncologically, the patient denies any complaints and ROS questioning is negative.  Past Medical History  Diagnosis Date  . HTN (hypertension)   . GERD (gastroesophageal reflux disease)     tums  . IBS (irritable bowel syndrome)   . Hemorrhoid   . History of blood transfusion     post child birth  . PPD positive, treated 1999  . Sleep apnea     "gone after I had OR" (05/08/2012)  . Diabetes mellitus 1999    "dx; never on meds" (05/08/2012)  . H/O hiatal hernia   . Arthritis     "of the spine" (05/08/2012)  . Breast cancer 1995    "left" (05/08/2012)  . Breast cancer, right breast, IDC, Multifocal 03/12/2012    Multifocal, 10 and 8 left breast   . Osteopenia 12/11/2012    On Ca++ and Vit D. Bone density in April 2014.  Next bone density due in April 2016.  Marland Kitchen PONV (postoperative nausea and vomiting)   . Exertional shortness of breath   . Abnormal genetic test 04/09/2013    PALB2 and CHEK2 positivity     has Popliteal cyst; Breast cancer, right breast, IDC, Multifocal; Hx Left breast cancer, UOQ, IDC, receptor negative; GERD (gastroesophageal reflux disease); Adenomatous polyps; Thyroid nodule; Osteopenia; Abnormal genetic test; and Acquired trigger finger on her problem list.     is allergic to penicillins.  Amy Houston does not currently have medications on file.  Past Surgical History  Procedure Laterality Date  . Tubal ligation  1976  . Mastectomy complete / simple w/ sentinel node biopsy Right 05/08/2012  . Mastectomy complete / simple Left 05/08/2012  .  Uvulopalatopharyngoplasty, tonsillectomy and septoplasty  ~ 2008  . Cholecystectomy  1992  . Dilation and curettage of uterus  1968  . Tonsillectomy and adenoidectomy  ~ 2008  . Breast lumpectomy Left 1995  . Breast biopsy Left 1995  . Breast biopsy Bilateral 03/2012    "one on the left; 2 on the right" (05/08/2012)  . Simple mastectomy with axillary sentinel node biopsy Right 05/08/2012    Procedure: RIGHT TOTAL MASTECTOMY WITH AXILLARY SENTINEL NODE BIOPSY;  Surgeon: Haywood Lasso, MD;  Location: [facility];  Service: General;  Laterality: Right;  . Simple mastectomy with axillary sentinel node biopsy Left 05/08/2012    Procedure: LEFT TOTAL MASTECTOMY;  Surgeon: Haywood Lasso, MD;  Location: [facility];  Service: General;  Laterality: Left;  . Colonoscopy  12/06/93    Dr. Hayes:single diminutive polyp of the descending colon/extrernal hemorrhoids, benign path  . Colonoscopy  11/30/98    Dr Hayes:small rectal polyp/internal hemorrhoids, benign path  . Colonoscopy  11/26/2006    Dr. Hayes:sigmoid polyp/small interal hemorrhoids, adenomatous  . Esophagogastroduodenoscopy  1994    Dr. Cristina Gong: small hiatal hernia and Schatzki's ring widely patent, CLO test negative  . Colonoscopy with esophagogastroduodenoscopy (egd) N/A 07/01/2012    Procedure: COLONOSCOPY WITH ESOPHAGOGASTRODUODENOSCOPY (EGD);  Surgeon: Danie Binder, MD;  [facility];  Service: Endoscopy;  Laterality: N/A;  12:30-rescheduled to [place] notified pt  . Esophageal dilation  07/01/2012    Procedure: ESOPHAGEAL DILATION;  Surgeon: Danie Binder, MD;  [facility];  Service: Endoscopy;;  . Esophagogastroduodenoscopy (egd) with esophageal dilation N/A 07/23/2012    Procedure: ESOPHAGOGASTRODUODENOSCOPY (EGD) WITH ESOPHAGEAL DILATION;  Surgeon: Danie Binder, MD;  [facility];  Service: Endoscopy;  Laterality: N/A;  8:45  . Salpingoophorectomy Bilateral 01/30/2013    Procedure: SALPINGO  OOPHORECTOMY;  Surgeon: Melina Schools, MD;  Location: [facility];  Service: Gynecology;  Laterality: Bilateral;  . Abdominal hysterectomy Bilateral 01/30/2013    Procedure: HYSTERECTOMY ABDOMINAL;  Surgeon: Melina Schools, MD;  Location: [facility];  Service: Gynecology;  Laterality: Bilateral;    Denies any headaches, dizziness, double vision, fevers, chills, night sweats, nausea, vomiting, diarrhea, constipation, chest pain, heart palpitations, shortness of breath, blood in stool, black tarry stool, urinary pain, urinary burning, urinary frequency, hematuria.   PHYSICAL EXAMINATION  ECOG PERFORMANCE STATUS: 0 - Asymptomatic  Filed Vitals:   12/16/13 1435  BP: 141/47  Pulse: 83  Temp: 98.4 F (36.9 C)  Resp: 16    GENERAL:alert, no distress, well nourished, well developed, comfortable, cooperative and smiling SKIN: skin color, texture, turgor are normal, no rashes or significant lesions HEAD: Normocephalic, No masses, lesions, tenderness or abnormalities EYES: normal, PERRLA, EOMI, Conjunctiva are pink and non-injected EARS: External ears normal OROPHARYNX:mucous membranes are moist  NECK: supple, no adenopathy, thyroid normal size, non-tender, without nodularity, no stridor, non-tender, trachea midline LYMPH:  no palpable lymphadenopathy BREAST:B/L post-mastectomy site well healed and free of suspicious changes LUNGS: clear to auscultation and percussion HEART: regular rate & rhythm, no murmurs, no gallops, S1 normal and S2 normal ABDOMEN:abdomen soft, non-tender, obese, normal bowel sounds, no masses or organomegaly and no hepatosplenomegaly BACK: Back symmetric, no curvature., No CVA tenderness EXTREMITIES:less then 2 second capillary refill, no joint deformities, effusion, or inflammation, no edema, no skin discoloration, no clubbing, no cyanosis  NEURO: alert & oriented x 3 with fluent speech, no focal motor/sensory deficits, gait normal   LABORATORY DATA: CBC    Component  Value Date/Time   WBC 7.8 12/16/2013 1426   RBC 3.98 12/16/2013 1426   HGB 12.4 12/16/2013 1426   HCT 38.2 12/16/2013 1426   PLT 331 12/16/2013 1426   MCV 96.0 12/16/2013 1426   MCH 31.2 12/16/2013 1426   MCHC 32.5 12/16/2013 1426   RDW 12.6 12/16/2013 1426   LYMPHSABS 2.1 12/16/2013 1426   MONOABS 0.4 12/16/2013 1426   EOSABS 0.0 12/16/2013 1426   BASOSABS 0.0 12/16/2013 1426      Chemistry      Component Value Date/Time   NA 141 12/16/2013 1426   K 4.1 12/16/2013 1426   CL 102 12/16/2013 1426   CO2 25 12/16/2013 1426   BUN 20 12/16/2013 1426   CREATININE 0.94 12/16/2013 1426      Component Value Date/Time   CALCIUM 9.9 12/16/2013 1426   ALKPHOS 83 12/16/2013 1426   AST 29 12/16/2013 1426   ALT 30 12/16/2013 1426   BILITOT 0.4 12/16/2013 1426        RADIOGRAPHIC STUDIES:  12/12/2013  CLINICAL DATA: 68 year old female status post double mastectomy for bilateral breast cancer.  EXAM: CT CHEST WITHOUT CONTRAST  TECHNIQUE: Multidetector CT imaging of the chest was performed following the standard protocol without IV contrast.  COMPARISON: Chest CT 12/10/2012.  FINDINGS: Mediastinum: Heart size is normal. There is no significant pericardial fluid, thickening or pericardial calcification. There is atherosclerosis of the thoracic aorta, the great vessels of the mediastinum and the coronary arteries, including calcified atherosclerotic plaque in the left main, left anterior descending, left circumflex and right coronary arteries. Calcifications of the inferior mitral annulus and aortic root. No pathologically enlarged mediastinal, internal mammary or hilar lymph nodes. Please note that accurate exclusion of hilar adenopathy is limited on noncontrast CT scans. Esophagus is unremarkable in appearance.  Lungs/Pleura: Previously noted 5 mm area of ground-glass attenuation in the posterior aspect of the right upper lobe is unchanged (image 12 of series  3). Likewise, there is an unchanged 3 mm subpleural nodule in the periphery of the right upper lobe (image 14 of series 3). 3 mm nodule in the apex of the left upper lobe (image 9 of series 3) is unchanged. Sessile nodule along the minor fissure again noted, likely a subpleural lymph node. No new or enlarging suspicious appearing pulmonary nodules or masses are otherwise noted.  Upper Abdomen: Status post cholecystectomy. Mild diffuse decreased attenuation throughout the hepatic parenchyma, compatible with mild hepatic steatosis.  Musculoskeletal: Status post bilateral modified radical mastectomy and bilateral axillary nodal dissection. No definite soft tissue mass identified along either chest wall, and no evidence of axillary or subpectoral lymphadenopathy. There are no aggressive appearing lytic or blastic lesions noted in the visualized portions of the skeleton. Prominent Schmorl's node in the superior endplate of T9 is unchanged.  IMPRESSION: 1. Status post bilateral modified radical mastectomy and bilateral axillary nodal dissection, with no evidence to suggest local recurrence of disease or definitive findings to suggest metastatic disease in the lungs at this time. 2. All previously noted pulmonary nodules appear unchanged in size, number and distribution and are favored to be benign. 3. Atherosclerosis, including left main and 3 vessel coronary artery disease. Please note that although the presence of coronary artery calcium documents the presence of coronary artery disease, the severity of this disease and any potential stenosis cannot be assessed on this non-gated CT examination. Assessment for potential risk factor modification, dietary therapy or pharmacologic therapy may be warranted, if clinically indicated. 4. Mild hepatic steatosis.   Electronically Signed  By: Vinnie Langton M.D.  On: 12/12/2013 15:44    ASSESSMENT:  1. Right sided breast  cancer,GRADE II, 1.2 cm invasive ductal carcinoma, ER +99%, PR +72%, grade 2, LV I positive, Ki-67 marker high at 60%, but 4 sentinel lymph nodes negative for metastatic disease. HER-2/neu nonamplified. S/P Bilateral mastectomy on 05/08/2012 by Dr. Osborn Houston. Her Oncotype DX score was 14 giving her recurrence rate of 9% at 5 years of hormonal therapy. With that in mind she was not considered a candidate for sytemic chemotherapy. Now on Aromasin beginning on 06/03/2012 and she will take this lifelong due to her PALB2 And CHEK 2 positivity. S/P bilateral salpingo-oophorectomy on 01/30/2013. 2. PALB2 + mutation  3. CHEK2 + mutation  4. left-sided breast cancer 1996, treated with lumpectomy followed by chemotherapy consisting of CMF for 8 cycles. followed by radiation therapy. She was not given tamoxifen. There was some question of Megace one tablet a day but she is not sure of the duration. This may have been for hot flashes which she remembers were severe. She was eventually placed on Effexor for hot flashes but is now on the Effexor for depression.  5. Excess weight  6. History of a popliteal cyst  7. Vitamin D deficiency on replacement  8. Osteopenia, next bone density due in April 2016. On Ca++ and Vit D.  Patient Active Problem List   Diagnosis Date Noted  . Acquired trigger finger 05/06/2013  . Abnormal genetic test 04/09/2013  . Osteopenia 12/11/2012  . Thyroid nodule 06/21/2012  . GERD (gastroesophageal reflux disease) 06/03/2012  . Adenomatous polyps 06/03/2012  . Breast cancer, right breast, IDC, Multifocal 03/12/2012  . Popliteal cyst 08/18/2010  . Hx Left breast cancer, UOQ, IDC, receptor negative 11/08/1993     PLAN:  1. I personally reviewed and went over laboratory results with the patient.  The results are noted within this dictation. 2. I personally reviewed and went over radiographic studies with the patient.  The results are noted within this dictation.   3. Chart  reviewed 4. Labs today: CBC diff, CMET, CA 27.29 5. Bone density is due in April 2016. 6. Continue Anastrozole daily.  Compliance encouraged.  She will require this lifelong due to increased risk of recurrence with PALB2 and CHEK2 mutations  7. Labs in 6 months: CBC diff, CMET, CA27.29 8. Recommend Prolia for osteopenia.  Will check with insurance and start in the near future. 9. CT of chest in December 2016.  To follow-up on Pulmonary nodules. 10. Return in 6 months for follow-up.   THERAPY PLAN:  Encouraged compliance with Aromasin which we recommend at this time she continue lifelong due to [place] and CHEK2 positivity. We will follow NCCN guidelines pertaining to surveillance.   NCCN guidelines recommends the following surveillance for invasive breast cancer:  A. History and Physical exam every 4-6 months for 5 years and then every 12 months.  B. Mammography every 12 months  C. Women on Tamoxifen: annual gynecologic assessment every 12 months if uterus is present.  D. Women on aromatase inhibitor or who experience ovarian failure secondary to treatment should have monitoring of bone health with a bone mineral density determination at baseline and periodically thereafter.  E. Assess and encourage adherence to adjuvant endocrine therapy.  F. Evidence suggests that active lifestyle and achieving and maintaining an ideal body weight (20-25 BMI) may lead to optimal breast cancer outcomes.   All questions were answered. The patient knows to call the clinic with any problems, questions or concerns. We can certainly see the patient much sooner if necessary.  Patient and plan discussed with Dr. Farrel Gobble and he is in agreement with the aforementioned.   Amy Houston 12/16/2013

## 2013-12-15 ENCOUNTER — Other Ambulatory Visit (HOSPITAL_COMMUNITY): Payer: Medicare Other

## 2013-12-15 ENCOUNTER — Ambulatory Visit (HOSPITAL_COMMUNITY): Payer: Medicare Other | Admitting: Oncology

## 2013-12-16 ENCOUNTER — Encounter (HOSPITAL_COMMUNITY): Payer: Medicare Other

## 2013-12-16 ENCOUNTER — Encounter (HOSPITAL_COMMUNITY): Payer: Self-pay | Admitting: Oncology

## 2013-12-16 ENCOUNTER — Encounter (HOSPITAL_COMMUNITY): Payer: Medicare Other | Attending: Oncology | Admitting: Oncology

## 2013-12-16 VITALS — BP 141/47 | HR 83 | Temp 98.4°F | Resp 16 | Wt 170.2 lb

## 2013-12-16 DIAGNOSIS — R898 Other abnormal findings in specimens from other organs, systems and tissues: Secondary | ICD-10-CM

## 2013-12-16 DIAGNOSIS — Z853 Personal history of malignant neoplasm of breast: Secondary | ICD-10-CM

## 2013-12-16 DIAGNOSIS — C50911 Malignant neoplasm of unspecified site of right female breast: Secondary | ICD-10-CM

## 2013-12-16 DIAGNOSIS — M8580 Other specified disorders of bone density and structure, unspecified site: Secondary | ICD-10-CM | POA: Diagnosis not present

## 2013-12-16 DIAGNOSIS — E559 Vitamin D deficiency, unspecified: Secondary | ICD-10-CM | POA: Diagnosis not present

## 2013-12-16 LAB — CBC WITH DIFFERENTIAL/PLATELET
Basophils Absolute: 0 10*3/uL (ref 0.0–0.1)
Basophils Relative: 0 % (ref 0–1)
EOS ABS: 0 10*3/uL (ref 0.0–0.7)
Eosinophils Relative: 1 % (ref 0–5)
HCT: 38.2 % (ref 36.0–46.0)
Hemoglobin: 12.4 g/dL (ref 12.0–15.0)
Lymphocytes Relative: 27 % (ref 12–46)
Lymphs Abs: 2.1 10*3/uL (ref 0.7–4.0)
MCH: 31.2 pg (ref 26.0–34.0)
MCHC: 32.5 g/dL (ref 30.0–36.0)
MCV: 96 fL (ref 78.0–100.0)
MONO ABS: 0.4 10*3/uL (ref 0.1–1.0)
Monocytes Relative: 5 % (ref 3–12)
Neutro Abs: 5.1 10*3/uL (ref 1.7–7.7)
Neutrophils Relative %: 67 % (ref 43–77)
PLATELETS: 331 10*3/uL (ref 150–400)
RBC: 3.98 MIL/uL (ref 3.87–5.11)
RDW: 12.6 % (ref 11.5–15.5)
WBC: 7.8 10*3/uL (ref 4.0–10.5)

## 2013-12-16 LAB — COMPREHENSIVE METABOLIC PANEL
ALT: 30 U/L (ref 0–35)
AST: 29 U/L (ref 0–37)
Albumin: 4.2 g/dL (ref 3.5–5.2)
Alkaline Phosphatase: 83 U/L (ref 39–117)
Anion gap: 14 (ref 5–15)
BUN: 20 mg/dL (ref 6–23)
CO2: 25 mEq/L (ref 19–32)
CREATININE: 0.94 mg/dL (ref 0.50–1.10)
Calcium: 9.9 mg/dL (ref 8.4–10.5)
Chloride: 102 mEq/L (ref 96–112)
GFR calc non Af Amer: 61 mL/min — ABNORMAL LOW (ref >90)
GFR, EST AFRICAN AMERICAN: 71 mL/min — AB (ref >90)
Glucose, Bld: 178 mg/dL — ABNORMAL HIGH (ref 70–99)
Potassium: 4.1 mEq/L (ref 3.7–5.3)
SODIUM: 141 meq/L (ref 137–147)
TOTAL PROTEIN: 7.9 g/dL (ref 6.0–8.3)
Total Bilirubin: 0.4 mg/dL (ref 0.3–1.2)

## 2013-12-16 NOTE — Patient Instructions (Signed)
[facility] Discharge Instructions  RECOMMENDATIONS MADE BY THE CONSULTANT AND ANY TEST RESULTS WILL BE SENT TO YOUR REFERRING PHYSICIAN.  You will be due for a bone density in April 2016 We will try to get an MRI of breast in May 2016 Labs today are pending. Labs in 6 months for follow-up You are a good candidate for Prolia for osteopenia.  We will make sure your insurance will cover this injection.  Continue Ca++ and Vit D. Return in 6 months for follow-up.  Thank you for choosing [facility] to provide your oncology and hematology care.  To afford each patient quality time with our providers, please arrive at least 15 minutes before your scheduled appointment time.  With your help, our goal is to use those 15 minutes to complete the necessary work-up to ensure our physicians have the information they need to help with your evaluation and healthcare recommendations.    Effective January 1st, 2014, we ask that you re-schedule your appointment with our physicians should you arrive 10 or more minutes late for your appointment.  We strive to give you quality time with our providers, and arriving late affects you and other patients whose appointments are after yours.    Again, thank you for choosing [facility].  Our hope is that these requests will decrease the amount of time that you wait before being seen by our physicians.       _____________________________________________________________  Should you have questions after your visit to [facility], please contact our office at [phone] between the hours of 8:30 a.m. and 5:00 p.m.  Voicemails left after 4:30 p.m. will not be returned until the following business day.  For prescription refill requests, have your pharmacy contact our office with your prescription refill request.

## 2013-12-17 LAB — TSH: TSH: 0.786 u[IU]/mL (ref 0.350–4.500)

## 2013-12-17 LAB — VITAMIN D 25 HYDROXY (VIT D DEFICIENCY, FRACTURES): Vit D, 25-Hydroxy: 43 ng/mL (ref 30–100)

## 2013-12-17 LAB — CANCER ANTIGEN 27.29

## 2013-12-18 ENCOUNTER — Other Ambulatory Visit (HOSPITAL_COMMUNITY): Payer: Medicare Other

## 2013-12-18 ENCOUNTER — Ambulatory Visit (HOSPITAL_COMMUNITY): Payer: Medicare Other | Admitting: Oncology

## 2014-02-09 ENCOUNTER — Other Ambulatory Visit (HOSPITAL_COMMUNITY): Payer: Self-pay | Admitting: Oncology

## 2014-04-20 ENCOUNTER — Other Ambulatory Visit (HOSPITAL_COMMUNITY): Payer: Medicare Other

## 2014-04-20 ENCOUNTER — Ambulatory Visit (HOSPITAL_COMMUNITY)
Admission: RE | Admit: 2014-04-20 | Discharge: 2014-04-20 | Disposition: A | Discharge status: Home / Self Care | Payer: PPO | Source: Ambulatory Visit | Attending: Oncology | Admitting: Oncology

## 2014-04-20 DIAGNOSIS — C50911 Malignant neoplasm of unspecified site of right female breast: Secondary | ICD-10-CM | POA: Diagnosis not present

## 2014-04-22 ENCOUNTER — Other Ambulatory Visit (HOSPITAL_COMMUNITY): Payer: Self-pay | Admitting: Oncology

## 2014-05-04 ENCOUNTER — Other Ambulatory Visit (HOSPITAL_COMMUNITY): Payer: Self-pay | Admitting: Oncology

## 2014-05-11 ENCOUNTER — Ambulatory Visit (HOSPITAL_COMMUNITY)
Admission: RE | Admit: 2014-05-11 | Discharge: 2014-05-11 | Disposition: A | Discharge status: Home / Self Care | Payer: PPO | Source: Ambulatory Visit | Attending: Oncology | Admitting: Oncology

## 2014-05-11 DIAGNOSIS — Z853 Personal history of malignant neoplasm of breast: Secondary | ICD-10-CM | POA: Diagnosis not present

## 2014-05-11 DIAGNOSIS — Z9013 Acquired absence of bilateral breasts and nipples: Secondary | ICD-10-CM | POA: Insufficient documentation

## 2014-05-11 DIAGNOSIS — C50911 Malignant neoplasm of unspecified site of right female breast: Secondary | ICD-10-CM

## 2014-05-11 DIAGNOSIS — R898 Other abnormal findings in specimens from other organs, systems and tissues: Secondary | ICD-10-CM | POA: Diagnosis not present

## 2014-05-11 LAB — POCT I-STAT CREATININE: Creatinine, Ser: 1 mg/dL (ref 0.44–1.00)

## 2014-05-11 MED ORDER — GADOBENATE DIMEGLUMINE 529 MG/ML IV SOLN
15.0000 mL | Freq: Once | INTRAVENOUS | Status: AC | PRN
  Administered 2014-05-11: 15 mL via INTRAVENOUS

## 2014-05-12 ENCOUNTER — Telehealth (HOSPITAL_COMMUNITY): Payer: Self-pay | Admitting: Emergency Medicine

## 2014-05-12 NOTE — Telephone Encounter (Signed)
Notified pt that MRI was negitive

## 2014-05-12 NOTE — Telephone Encounter (Signed)
-----   Message from [facility], PA-C sent at 05/12/2014  2:24 PM EDT ----- Negative

## 2014-06-16 ENCOUNTER — Encounter (HOSPITAL_COMMUNITY): Payer: PPO | Attending: Hematology & Oncology

## 2014-06-16 DIAGNOSIS — M8580 Other specified disorders of bone density and structure, unspecified site: Secondary | ICD-10-CM | POA: Diagnosis present

## 2014-06-16 DIAGNOSIS — C50911 Malignant neoplasm of unspecified site of right female breast: Secondary | ICD-10-CM | POA: Insufficient documentation

## 2014-06-16 DIAGNOSIS — Z853 Personal history of malignant neoplasm of breast: Secondary | ICD-10-CM

## 2014-06-16 DIAGNOSIS — C50411 Malignant neoplasm of upper-outer quadrant of right female breast: Secondary | ICD-10-CM

## 2014-06-16 LAB — CBC WITH DIFFERENTIAL/PLATELET
BASOS ABS: 0 10*3/uL (ref 0.0–0.1)
BASOS PCT: 0 % (ref 0–1)
EOS PCT: 1 % (ref 0–5)
Eosinophils Absolute: 0.1 10*3/uL (ref 0.0–0.7)
HEMATOCRIT: 38.2 % (ref 36.0–46.0)
HEMOGLOBIN: 12.6 g/dL (ref 12.0–15.0)
LYMPHS ABS: 3.1 10*3/uL (ref 0.7–4.0)
Lymphocytes Relative: 34 % (ref 12–46)
MCH: 31.5 pg (ref 26.0–34.0)
MCHC: 33 g/dL (ref 30.0–36.0)
MCV: 95.5 fL (ref 78.0–100.0)
MONOS PCT: 5 % (ref 3–12)
Monocytes Absolute: 0.5 10*3/uL (ref 0.1–1.0)
NEUTROS PCT: 60 % (ref 43–77)
Neutro Abs: 5.4 10*3/uL (ref 1.7–7.7)
Platelets: 307 10*3/uL (ref 150–400)
RBC: 4 MIL/uL (ref 3.87–5.11)
RDW: 12.8 % (ref 11.5–15.5)
WBC: 9 10*3/uL (ref 4.0–10.5)

## 2014-06-16 LAB — COMPREHENSIVE METABOLIC PANEL
ALT: 27 U/L (ref 14–54)
AST: 24 U/L (ref 15–41)
Albumin: 4.4 g/dL (ref 3.5–5.0)
Alkaline Phosphatase: 70 U/L (ref 38–126)
Anion gap: 9 (ref 5–15)
BUN: 24 mg/dL — AB (ref 6–20)
CALCIUM: 9.2 mg/dL (ref 8.9–10.3)
CHLORIDE: 102 mmol/L (ref 101–111)
CO2: 23 mmol/L (ref 22–32)
CREATININE: 0.99 mg/dL (ref 0.44–1.00)
GFR calc Af Amer: >60 mL/min (ref >60)
GFR, EST NON AFRICAN AMERICAN: 58 mL/min — AB (ref >60)
Glucose, Bld: 116 mg/dL — ABNORMAL HIGH (ref 65–99)
Potassium: 3.6 mmol/L (ref 3.5–5.1)
Sodium: 134 mmol/L — ABNORMAL LOW (ref 135–145)
Total Bilirubin: 0.7 mg/dL (ref 0.3–1.2)
Total Protein: 7.7 g/dL (ref 6.5–8.1)

## 2014-06-16 NOTE — Progress Notes (Signed)
Amy Houston presented for labwork. Labs per MD order drawn via 25g venipuncture. Procedure without incident.  Patient tolerated procedure well.

## 2014-06-17 ENCOUNTER — Encounter (HOSPITAL_COMMUNITY): Payer: PPO

## 2014-06-17 ENCOUNTER — Ambulatory Visit (HOSPITAL_COMMUNITY): Payer: Medicare Other | Admitting: Hematology & Oncology

## 2014-06-17 ENCOUNTER — Encounter (HOSPITAL_BASED_OUTPATIENT_CLINIC_OR_DEPARTMENT_OTHER): Payer: PPO | Admitting: Hematology & Oncology

## 2014-06-17 ENCOUNTER — Other Ambulatory Visit (HOSPITAL_COMMUNITY): Payer: Medicare Other

## 2014-06-17 VITALS — BP 136/69 | HR 79 | Temp 98.6°F | Resp 16 | Wt 161.9 lb

## 2014-06-17 DIAGNOSIS — F329 Major depressive disorder, single episode, unspecified: Secondary | ICD-10-CM

## 2014-06-17 DIAGNOSIS — C50911 Malignant neoplasm of unspecified site of right female breast: Secondary | ICD-10-CM

## 2014-06-17 DIAGNOSIS — E559 Vitamin D deficiency, unspecified: Secondary | ICD-10-CM | POA: Diagnosis not present

## 2014-06-17 DIAGNOSIS — M8580 Other specified disorders of bone density and structure, unspecified site: Secondary | ICD-10-CM | POA: Diagnosis not present

## 2014-06-17 DIAGNOSIS — Z853 Personal history of malignant neoplasm of breast: Secondary | ICD-10-CM

## 2014-06-17 LAB — VITAMIN D 25 HYDROXY (VIT D DEFICIENCY, FRACTURES): VIT D 25 HYDROXY: 37.5 ng/mL (ref 30.0–100.0)

## 2014-06-17 LAB — CANCER ANTIGEN 27.29: CA 27.29: 17.5 U/mL (ref 0.0–38.6)

## 2014-06-17 MED ORDER — DENOSUMAB 60 MG/ML ~~LOC~~ SOLN
60.0000 mg | Freq: Once | SUBCUTANEOUS | Status: AC
  Administered 2014-06-17: 60 mg via SUBCUTANEOUS
  Filled 2014-06-17: qty 1

## 2014-06-17 NOTE — Progress Notes (Signed)
Amy Houston presents today for injection per MD orders. Prolia '60mg'$  administered SQ in right Abdomen. Administration without incident. Patient tolerated well.

## 2014-06-17 NOTE — Progress Notes (Signed)
Please see doctors encounter for more information 

## 2014-06-17 NOTE — Patient Instructions (Signed)
..  [place] at [facility] Discharge Instructions  RECOMMENDATIONS MADE BY THE CONSULTANT AND ANY TEST RESULTS WILL BE SENT TO YOUR REFERRING PHYSICIAN.  You will have a CT scan prior to your next follow up appointment.  Your follow up appointment will be in 6 months.  We will also be doing labs prior to that appointment.  Please see Amy prior to leaving today for your schedule.    Thank you for choosing [place] at [facility] to provide your oncology and hematology care.  To afford each patient quality time with our provider, please arrive at least 15 minutes before your scheduled appointment time.    You need to re-schedule your appointment should you arrive 10 or more minutes late.  We strive to give you quality time with our providers, and arriving late affects you and other patients whose appointments are after yours.  Also, if you no show three or more times for appointments you may be dismissed from the clinic at the providers discretion.     Again, thank you for choosing [facility].  Our hope is that these requests will decrease the amount of time that you wait before being seen by our physicians.       _____________________________________________________________  Should you have questions after your visit to [facility], please contact our office at [phone] between the hours of 8:30 a.m. and 4:30 p.m.  Voicemails left after 4:30 p.m. will not be returned until the following business day.  For prescription refill requests, have your pharmacy contact our office.

## 2014-06-17 NOTE — Progress Notes (Signed)
Amy Bogus, MD [place] [address]  Osteopenia - Plan: SCHEDULING COMMUNICATION, denosumab (PROLIA) injection 60 mg  CURRENT THERAPY: Aromasin daily beginning on 06/03/2012 and she will take this lifelong due to increased recurrence rate with PALB2 and CHEK2 positivity. Osteopenia, calcium plus vitamin D. Prolia therapy  INTERVAL HISTORY: Amy Houston 69 y.o. female returns for  regular  visit for followup of right sided breast cancer,GRADE II, 1.2 cm invasive ductal carcinoma, ER +99%, PR +72%, grade 2, LV I positive, Ki-67 marker high at 60%, but 4 sentinel lymph nodes negative for metastatic disease. HER-2/neu nonamplified. S/P Bilateral mastectomy on 05/08/2012 by Dr. Osborn Coho. Her Oncotype DX score was 14 giving her recurrence rate of 9% at 5 years of hormonal therapy. With that in mind she was not considered a candidate for sytemic chemotherapy. Now on Aromasin beginning on 06/03/2012 and she will take this lifelong due to her PALB2 And CHEK 2 positivity. Additionally, she is S/P bilateral salpingo-oophorectomy on 01/30/2013. 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 followed by radiation therapy. She was not given tamoxifen. There was some question of Megace one tablet a day but she is not sure of the duration. This may have been for hot flashes which she remembers were severe. She was eventually placed on Effexor for hot flashes but is now on the Effexor for depression.   She presents to the clinic today alone and says she feels great. Says Aromasin and Prolia is going well She has lost 11 lbs, along with her husband, who has lost 40 lbs; portion control and carbs They will have their 50th anniversary next month Regularly visits dentist. She is up to date on screening colonoscopy.  She has no other complaints or concerns today.    Breast cancer, right breast, IDC, Multifocal   03/19/2012 Initial Diagnosis Breast cancer, right breast, IDC, Multifocal   05/08/2012 Surgery Bilateral simple mastectomies by Dr. Margot Chimes   06/03/2012 -  Chemotherapy Aromatase inhibitor.  PALB2 and CHEK2+ and therefore would benefit from lifelong AI therapy.   01/30/2013 Surgery Laparotomy, minimal lysis of adhesions, abdominal hysterectomy, bilateral salpingo-oophorectomy.    Oncologically, the patient denies any complaints and ROS questioning is negative.  Past Medical History  Diagnosis Date  . HTN (hypertension)   . GERD (gastroesophageal reflux disease)     tums  . IBS (irritable bowel syndrome)   . Hemorrhoid   . History of blood transfusion     post child birth  . PPD positive, treated 1999  . Sleep apnea     "gone after I had OR" (05/08/2012)  . Diabetes mellitus 1999    "dx; never on meds" (05/08/2012)  . H/O hiatal hernia   . Arthritis     "of the spine" (05/08/2012)  . Breast cancer 1995    "left" (05/08/2012)  . Breast cancer, right breast, IDC, Multifocal 03/12/2012    Multifocal, 10 and 8 left breast   . Osteopenia 12/11/2012    On Ca++ and Vit D. Bone density in April 2014.  Next bone density due in April 2016.  Marland Kitchen PONV (postoperative nausea and vomiting)   . Exertional shortness of breath   . Abnormal genetic test 04/09/2013    PALB2 and CHEK2 positivity     has Popliteal cyst; Breast cancer,  right breast, IDC, Multifocal; Hx Left breast cancer, UOQ, IDC, receptor negative; GERD (gastroesophageal reflux disease); Adenomatous polyps; Thyroid nodule; Osteopenia; Abnormal genetic test; and Acquired trigger finger on her problem list.     is allergic to penicillins.  Amy Houston does not currently have medications on file.  Past Surgical History  Procedure Laterality Date  . Tubal ligation  1976  . Mastectomy complete / simple w/ sentinel node biopsy Right 05/08/2012  .  Mastectomy complete / simple Left 05/08/2012  . Uvulopalatopharyngoplasty, tonsillectomy and septoplasty  ~ 2008  . Cholecystectomy  1992  . Dilation and curettage of uterus  1968  . Tonsillectomy and adenoidectomy  ~ 2008  . Breast lumpectomy Left 1995  . Breast biopsy Left 1995  . Breast biopsy Bilateral 03/2012    "one on the left; 2 on the right" (05/08/2012)  . Simple mastectomy with axillary sentinel node biopsy Right 05/08/2012    Procedure: RIGHT TOTAL MASTECTOMY WITH AXILLARY SENTINEL NODE BIOPSY;  Surgeon: Haywood Lasso, MD;  Location: [facility];  Service: General;  Laterality: Right;  . Simple mastectomy with axillary sentinel node biopsy Left 05/08/2012    Procedure: LEFT TOTAL MASTECTOMY;  Surgeon: Haywood Lasso, MD;  Location: [facility];  Service: General;  Laterality: Left;  . Colonoscopy  12/06/93    Dr. Hayes:single diminutive polyp of the descending colon/extrernal hemorrhoids, benign path  . Colonoscopy  11/30/98    Dr Hayes:small rectal polyp/internal hemorrhoids, benign path  . Colonoscopy  11/26/2006    Dr. Hayes:sigmoid polyp/small interal hemorrhoids, adenomatous  . Esophagogastroduodenoscopy  1994    Dr. Cristina Gong: small hiatal hernia and Schatzki's ring widely patent, CLO test negative  . Colonoscopy with esophagogastroduodenoscopy (egd) N/A 07/01/2012    Procedure: COLONOSCOPY WITH ESOPHAGOGASTRODUODENOSCOPY (EGD);  Surgeon: Danie Binder, MD;  [facility];  Service: Endoscopy;  Laterality: N/A;  12:30-rescheduled to [place] notified pt  . Esophageal dilation  07/01/2012    Procedure: ESOPHAGEAL DILATION;  Surgeon: Danie Binder, MD;  [facility];  Service: Endoscopy;;  . Esophagogastroduodenoscopy (egd) with esophageal dilation N/A 07/23/2012    Procedure: ESOPHAGOGASTRODUODENOSCOPY (EGD) WITH ESOPHAGEAL DILATION;  Surgeon: Danie Binder, MD;  [facility];  Service: Endoscopy;  Laterality: N/A;  8:45  . Salpingoophorectomy  Bilateral 01/30/2013    Procedure: SALPINGO OOPHORECTOMY;  Surgeon: Melina Schools, MD;  Location: [facility];  Service: Gynecology;  Laterality: Bilateral;  . Abdominal hysterectomy Bilateral 01/30/2013    Procedure: HYSTERECTOMY ABDOMINAL;  Surgeon: Melina Schools, MD;  Location: [facility];  Service: Gynecology;  Laterality: Bilateral;   FAMILY HISTORY UPDATE:  2 daughters, both received her cancer gene, youngest daughter diagnosed with breast cancer last month, oldest regularly seen at [facility] for breast cancer Sister received one of her 2 abnormality genes however, she is completely normal   ROS Denies any headaches, dizziness, double vision, fevers, chills, night sweats, nausea, vomiting, diarrhea, constipation, chest pain, heart palpitations, shortness of breath, blood in stool, black tarry stool, urinary pain, urinary burning, urinary frequency, hematuria.  14 point review of systems was performed and is negative except as detailed under history of present illness and above    PHYSICAL EXAMINATION  ECOG PERFORMANCE STATUS: 0 - Asymptomatic  Filed Vitals:   06/17/14 1358  BP: 136/69  Pulse: 79  Temp: 98.6 F (37 C)  Resp: 16    GENERAL:alert, no distress, well nourished, well developed, comfortable, cooperative and smiling SKIN: skin color,  texture, turgor are normal, no rashes or significant lesions HEAD: Normocephalic, No masses, lesions, tenderness or abnormalities EYES: normal, PERRLA, EOMI, Conjunctiva are pink and non-injected EARS: External ears normal OROPHARYNX:mucous membranes are moist  NECK: supple, no adenopathy, thyroid normal size, non-tender, without nodularity, no stridor, non-tender, trachea midline LYMPH:  no palpable lymphadenopathy BREAST:B/L post-mastectomy site well healed and free of suspicious changes LUNGS: clear to auscultation and percussion HEART: regular rate & rhythm, no murmurs, no gallops, S1 normal and S2 normal Subtle systolic ejection  murmur ABDOMEN:abdomen soft, non-tender, obese, normal bowel sounds, no masses or organomegaly and no hepatosplenomegaly BACK: Back symmetric, no curvature., No CVA tenderness EXTREMITIES:less then 2 second capillary refill, no joint deformities, effusion, or inflammation, no edema, no skin discoloration, no clubbing, no cyanosis  NEURO: alert & oriented x 3 with fluent speech, no focal motor/sensory deficits, gait normal   LABORATORY DATA: CBC    Component Value Date/Time   WBC 9.0 06/16/2014 1505   RBC 4.00 06/16/2014 1505   HGB 12.6 06/16/2014 1505   HCT 38.2 06/16/2014 1505   PLT 307 06/16/2014 1505   MCV 95.5 06/16/2014 1505   MCH 31.5 06/16/2014 1505   MCHC 33.0 06/16/2014 1505   RDW 12.8 06/16/2014 1505   LYMPHSABS 3.1 06/16/2014 1505   MONOABS 0.5 06/16/2014 1505   EOSABS 0.1 06/16/2014 1505   BASOSABS 0.0 06/16/2014 1505      Chemistry      Component Value Date/Time   NA 134* 06/16/2014 1505   K 3.6 06/16/2014 1505   CL 102 06/16/2014 1505   CO2 23 06/16/2014 1505   BUN 24* 06/16/2014 1505   CREATININE 0.99 06/16/2014 1505      Component Value Date/Time   CALCIUM 9.2 06/16/2014 1505   ALKPHOS 70 06/16/2014 1505   AST 24 06/16/2014 1505   ALT 27 06/16/2014 1505   BILITOT 0.7 06/16/2014 1505      RADIOGRAPHIC STUDIES:  12/12/2013  CLINICAL DATA: 69 year old female status post double mastectomy for bilateral breast cancer.  EXAM: CT CHEST WITHOUT CONTRAST  TECHNIQUE: Multidetector CT imaging of the chest was performed following the standard protocol without IV contrast.  COMPARISON: Chest CT 12/10/2012.  FINDINGS: Mediastinum: Heart size is normal. There is no significant pericardial fluid, thickening or pericardial calcification. There is atherosclerosis of the thoracic aorta, the great vessels of the mediastinum and the coronary arteries, including calcified atherosclerotic plaque in the left main, left anterior descending, left  circumflex and right coronary arteries. Calcifications of the inferior mitral annulus and aortic root. No pathologically enlarged mediastinal, internal mammary or hilar lymph nodes. Please note that accurate exclusion of hilar adenopathy is limited on noncontrast CT scans. Esophagus is unremarkable in appearance.  Lungs/Pleura: Previously noted 5 mm area of ground-glass attenuation in the posterior aspect of the right upper lobe is unchanged (image 12 of series 3). Likewise, there is an unchanged 3 mm subpleural nodule in the periphery of the right upper lobe (image 14 of series 3). 3 mm nodule in the apex of the left upper lobe (image 9 of series 3) is unchanged. Sessile nodule along the minor fissure again noted, likely a subpleural lymph node. No new or enlarging suspicious appearing pulmonary nodules or masses are otherwise noted.  Upper Abdomen: Status post cholecystectomy. Mild diffuse decreased attenuation throughout the hepatic parenchyma, compatible with mild hepatic steatosis.  Musculoskeletal: Status post bilateral modified radical mastectomy and bilateral axillary nodal dissection. No definite soft tissue mass identified along either chest wall,  and no evidence of axillary or subpectoral lymphadenopathy. There are no aggressive appearing lytic or blastic lesions noted in the visualized portions of the skeleton. Prominent Schmorl's node in the superior endplate of T9 is unchanged.  IMPRESSION: 1. Status post bilateral modified radical mastectomy and bilateral axillary nodal dissection, with no evidence to suggest local recurrence of disease or definitive findings to suggest metastatic disease in the lungs at this time. 2. All previously noted pulmonary nodules appear unchanged in size, number and distribution and are favored to be benign. 3. Atherosclerosis, including left main and 3 vessel coronary artery disease. Please note that although the presence of coronary  artery calcium documents the presence of coronary artery disease, the severity of this disease and any potential stenosis cannot be assessed on this non-gated CT examination. Assessment for potential risk factor modification, dietary therapy or pharmacologic therapy may be warranted, if clinically indicated. 4. Mild hepatic steatosis.   Electronically Signed  By: Vinnie Langton M.D.  On: 12/12/2013 15:44    ASSESSMENT:  1. Right sided breast cancer,GRADE II, 1.2 cm invasive ductal carcinoma, ER +99%, PR +72%, grade 2, LV I positive, Ki-67 marker high at 60%, but 4 sentinel lymph nodes negative for metastatic disease. HER-2/neu nonamplified. S/P Bilateral mastectomy on 05/08/2012 by Dr. Osborn Coho. Her Oncotype DX score was 14 giving her recurrence rate of 9% at 5 years of hormonal therapy. With that in mind she was not considered a candidate for sytemic chemotherapy. Now on Aromasin beginning on 06/03/2012 and she will take this lifelong due to her PALB2 And CHEK 2 positivity. S/P bilateral salpingo-oophorectomy on 01/30/2013. 2. PALB2 + mutation  3. CHEK2 + mutation  4. left-sided breast cancer 1996, treated with lumpectomy followed by chemotherapy consisting of CMF for 8 cycles. followed by radiation therapy. She was not given tamoxifen. There was some question of Megace one tablet a day but she is not sure of the duration. This may have been for hot flashes which she remembers were severe. She was eventually placed on Effexor for hot flashes but is now on the Effexor for depression.  5. Vitamin D deficiency on replacement  6. Osteopenia, next bone density due in April 2016. On Ca++ and Vit D. Prolia therapy to start today  Patient Active Problem List   Diagnosis Date Noted  . Acquired trigger finger 05/06/2013  . Abnormal genetic test 04/09/2013  . Osteopenia 12/11/2012  . Thyroid nodule 06/21/2012  . GERD (gastroesophageal reflux disease) 06/03/2012  . Adenomatous polyps  06/03/2012  . Breast cancer, right breast, IDC, Multifocal 03/12/2012  . Popliteal cyst 08/18/2010  . Hx Left breast cancer, UOQ, IDC, receptor negative 11/08/1993    PLAN:  1. I personally reviewed and went over laboratory results with the patient.  The results are noted within this dictation.   2. Chart reviewed 3. Labs today: CBC diff, CMET, CA 27.29 4. Bone density is due in April 2016. 5. Continue Anastrozole daily.  Compliance encouraged.  She will require this lifelong due to increased risk of recurrence with PALB2 and CHEK2 mutations  6. Labs in 6 months: CBC diff, CMET, CA27.29 7. Prolia for osteopenia and ongoing AI therapy 8. CT of chest in December 2016.  To follow-up on Pulmonary nodules. 9. Return in 6 months for follow-up. 10. Continued to encourage ongoing weight loss. Also encouraged increasing physical activity   THERAPY PLAN:  Encouraged compliance with Aromasin which we recommend at this time she continue lifelong due to [place] and CHEK2 positivity.  We will follow NCCN guidelines pertaining to surveillance.   NCCN guidelines recommends the following surveillance for invasive breast cancer:  A. History and Physical exam every 4-6 months for 5 years and then every 12 months.  B. Mammography every 12 months  C. Women on Tamoxifen: annual gynecologic assessment every 12 months if uterus is present.  D. Women on aromatase inhibitor or who experience ovarian failure secondary to treatment should have monitoring of bone health with a bone mineral density determination at baseline and periodically thereafter.  E. Assess and encourage adherence to adjuvant endocrine therapy.  F. Evidence suggests that active lifestyle and achieving and maintaining an ideal body weight (20-25 BMI) may lead to optimal breast cancer outcomes.   All questions were answered. The patient knows to call the clinic with any problems, questions or concerns. We can certainly see the patient much sooner  if necessary.  This document serves as a record of services personally performed by Ancil Linsey, MD. It was created on her behalf by Janace Hoard, a trained medical scribe. The creation of this record is based on the scribe's personal observations and the provider's statements to them. This document has been checked and approved by the attending provider.  I have reviewed the above documentation for accuracy and completeness, and I agree with the above.  Kelby Fam. Whitney Muse, MD

## 2014-06-22 ENCOUNTER — Telehealth (HOSPITAL_COMMUNITY): Payer: Self-pay | Admitting: Emergency Medicine

## 2014-06-22 NOTE — Telephone Encounter (Signed)
-----   Message from Baird Cancer, PA-C sent at 06/22/2014  9:33 AM EDT ----- Excellent.  Encourage increased PO fluids.  Mildly dehydrated.

## 2014-06-22 NOTE — Telephone Encounter (Signed)
Notified pt of blood work was good.  And told her to increase her PO intake.  Verbalized understanding

## 2014-06-30 ENCOUNTER — Encounter (HOSPITAL_COMMUNITY): Payer: Self-pay | Admitting: Hematology & Oncology

## 2014-07-16 ENCOUNTER — Other Ambulatory Visit: Payer: Self-pay | Admitting: Gastroenterology

## 2014-07-16 NOTE — Telephone Encounter (Signed)
RF X 2. She needs OV for further refills. Last seen 07/2012. OR she can get from PCP.

## 2014-08-26 ENCOUNTER — Other Ambulatory Visit: Payer: Self-pay | Admitting: Gastroenterology

## 2014-09-10 ENCOUNTER — Encounter: Payer: Self-pay | Admitting: Genetic Counselor

## 2014-09-10 DIAGNOSIS — Z1379 Encounter for other screening for genetic and chromosomal anomalies: Secondary | ICD-10-CM | POA: Insufficient documentation

## 2014-09-23 ENCOUNTER — Other Ambulatory Visit (HOSPITAL_COMMUNITY): Payer: Self-pay | Admitting: Oncology

## 2014-11-03 ENCOUNTER — Other Ambulatory Visit (HOSPITAL_COMMUNITY)
Admission: RE | Admit: 2014-11-03 | Discharge: 2014-11-03 | Disposition: A | Discharge status: Home / Self Care | Payer: PPO | Source: Ambulatory Visit | Attending: Pulmonary Disease | Admitting: Pulmonary Disease

## 2014-11-03 DIAGNOSIS — I10 Essential (primary) hypertension: Secondary | ICD-10-CM | POA: Insufficient documentation

## 2014-11-03 DIAGNOSIS — M810 Age-related osteoporosis without current pathological fracture: Secondary | ICD-10-CM | POA: Insufficient documentation

## 2014-11-03 DIAGNOSIS — C50919 Malignant neoplasm of unspecified site of unspecified female breast: Secondary | ICD-10-CM | POA: Diagnosis present

## 2014-11-03 DIAGNOSIS — E119 Type 2 diabetes mellitus without complications: Secondary | ICD-10-CM | POA: Diagnosis present

## 2014-11-03 DIAGNOSIS — G4730 Sleep apnea, unspecified: Secondary | ICD-10-CM | POA: Diagnosis not present

## 2014-11-03 LAB — LIPID PANEL
CHOL/HDL RATIO: 4.8 ratio
Cholesterol: 179 mg/dL (ref 0–200)
HDL: 37 mg/dL — AB (ref >40)
LDL CALC: 118 mg/dL — AB (ref 0–99)
Triglycerides: 118 mg/dL (ref <150)
VLDL: 24 mg/dL (ref 0–40)

## 2014-11-03 LAB — COMPREHENSIVE METABOLIC PANEL
ALT: 21 U/L (ref 14–54)
AST: 20 U/L (ref 15–41)
Albumin: 4.2 g/dL (ref 3.5–5.0)
Alkaline Phosphatase: 33 U/L — ABNORMAL LOW (ref 38–126)
Anion gap: 8 (ref 5–15)
BUN: 22 mg/dL — ABNORMAL HIGH (ref 6–20)
CO2: 25 mmol/L (ref 22–32)
Calcium: 9.3 mg/dL (ref 8.9–10.3)
Chloride: 107 mmol/L (ref 101–111)
Creatinine, Ser: 0.95 mg/dL (ref 0.44–1.00)
GFR calc Af Amer: >60 mL/min (ref >60)
GFR calc non Af Amer: >60 mL/min (ref >60)
Glucose, Bld: 136 mg/dL — ABNORMAL HIGH (ref 65–99)
Potassium: 4.2 mmol/L (ref 3.5–5.1)
Sodium: 140 mmol/L (ref 135–145)
Total Bilirubin: 0.7 mg/dL (ref 0.3–1.2)
Total Protein: 7.4 g/dL (ref 6.5–8.1)

## 2014-11-03 LAB — TSH: TSH: 1.088 u[IU]/mL (ref 0.350–4.500)

## 2014-11-03 LAB — CBC WITH DIFFERENTIAL/PLATELET
BASOS ABS: 0 10*3/uL (ref 0.0–0.1)
BASOS PCT: 0 %
EOS ABS: 0 10*3/uL (ref 0.0–0.7)
EOS PCT: 1 %
HCT: 37.9 % (ref 36.0–46.0)
HEMOGLOBIN: 12.5 g/dL (ref 12.0–15.0)
LYMPHS ABS: 1.8 10*3/uL (ref 0.7–4.0)
Lymphocytes Relative: 32 %
MCH: 31.3 pg (ref 26.0–34.0)
MCHC: 33 g/dL (ref 30.0–36.0)
MCV: 94.8 fL (ref 78.0–100.0)
Monocytes Absolute: 0.3 10*3/uL (ref 0.1–1.0)
Monocytes Relative: 5 %
NEUTROS PCT: 62 %
Neutro Abs: 3.6 10*3/uL (ref 1.7–7.7)
PLATELETS: 261 10*3/uL (ref 150–400)
RBC: 4 MIL/uL (ref 3.87–5.11)
RDW: 12.7 % (ref 11.5–15.5)
WBC: 5.7 10*3/uL (ref 4.0–10.5)

## 2014-11-04 LAB — HEMOGLOBIN A1C
HEMOGLOBIN A1C: 6.8 % — AB (ref 4.8–5.6)
MEAN PLASMA GLUCOSE: 148 mg/dL

## 2014-12-14 ENCOUNTER — Encounter (HOSPITAL_COMMUNITY): Payer: PPO | Attending: Oncology

## 2014-12-14 ENCOUNTER — Telehealth (HOSPITAL_COMMUNITY): Payer: Self-pay | Admitting: Emergency Medicine

## 2014-12-14 DIAGNOSIS — M8580 Other specified disorders of bone density and structure, unspecified site: Secondary | ICD-10-CM | POA: Diagnosis present

## 2014-12-14 DIAGNOSIS — C50911 Malignant neoplasm of unspecified site of right female breast: Secondary | ICD-10-CM | POA: Insufficient documentation

## 2014-12-14 LAB — CBC WITH DIFFERENTIAL/PLATELET
BASOS PCT: 0 %
Basophils Absolute: 0 10*3/uL (ref 0.0–0.1)
Eosinophils Absolute: 0 10*3/uL (ref 0.0–0.7)
Eosinophils Relative: 1 %
HEMATOCRIT: 39.3 % (ref 36.0–46.0)
HEMOGLOBIN: 13 g/dL (ref 12.0–15.0)
LYMPHS ABS: 2.3 10*3/uL (ref 0.7–4.0)
Lymphocytes Relative: 34 %
MCH: 31.6 pg (ref 26.0–34.0)
MCHC: 33.1 g/dL (ref 30.0–36.0)
MCV: 95.6 fL (ref 78.0–100.0)
MONO ABS: 0.5 10*3/uL (ref 0.1–1.0)
MONOS PCT: 7 %
NEUTROS PCT: 58 %
Neutro Abs: 4.1 10*3/uL (ref 1.7–7.7)
Platelets: 299 10*3/uL (ref 150–400)
RBC: 4.11 MIL/uL (ref 3.87–5.11)
RDW: 12.7 % (ref 11.5–15.5)
WBC: 6.9 10*3/uL (ref 4.0–10.5)

## 2014-12-14 LAB — COMPREHENSIVE METABOLIC PANEL
ALK PHOS: 38 U/L (ref 38–126)
ALT: 23 U/L (ref 14–54)
ANION GAP: 8 (ref 5–15)
AST: 20 U/L (ref 15–41)
Albumin: 4.4 g/dL (ref 3.5–5.0)
BILIRUBIN TOTAL: 0.7 mg/dL (ref 0.3–1.2)
BUN: 21 mg/dL — AB (ref 6–20)
CALCIUM: 10.2 mg/dL (ref 8.9–10.3)
CO2: 26 mmol/L (ref 22–32)
CREATININE: 1.12 mg/dL — AB (ref 0.44–1.00)
Chloride: 106 mmol/L (ref 101–111)
GFR, EST AFRICAN AMERICAN: 57 mL/min — AB (ref >60)
GFR, EST NON AFRICAN AMERICAN: 49 mL/min — AB (ref >60)
GLUCOSE: 122 mg/dL — AB (ref 65–99)
POTASSIUM: 4 mmol/L (ref 3.5–5.1)
Sodium: 140 mmol/L (ref 135–145)
Total Protein: 7.7 g/dL (ref 6.5–8.1)

## 2014-12-14 NOTE — Telephone Encounter (Signed)
-----   Message from Baird Cancer, PA-C sent at 12/14/2014  3:24 PM EST ----- I have reviewed all lab results which are normal or stable. Please inform the patient.  Some labs are pending.

## 2014-12-14 NOTE — Telephone Encounter (Signed)
Pt called and notified that labs were normal/stable

## 2014-12-15 ENCOUNTER — Ambulatory Visit (HOSPITAL_COMMUNITY)
Admission: RE | Admit: 2014-12-15 | Discharge: 2014-12-15 | Disposition: A | Discharge status: Home / Self Care | Payer: PPO | Source: Ambulatory Visit | Attending: Hematology & Oncology | Admitting: Hematology & Oncology

## 2014-12-15 ENCOUNTER — Ambulatory Visit (HOSPITAL_COMMUNITY): Payer: PPO

## 2014-12-15 DIAGNOSIS — I2510 Atherosclerotic heart disease of native coronary artery without angina pectoris: Secondary | ICD-10-CM | POA: Insufficient documentation

## 2014-12-15 DIAGNOSIS — Z9013 Acquired absence of bilateral breasts and nipples: Secondary | ICD-10-CM | POA: Diagnosis not present

## 2014-12-15 DIAGNOSIS — R918 Other nonspecific abnormal finding of lung field: Secondary | ICD-10-CM | POA: Diagnosis not present

## 2014-12-15 DIAGNOSIS — Z853 Personal history of malignant neoplasm of breast: Secondary | ICD-10-CM | POA: Diagnosis not present

## 2014-12-15 DIAGNOSIS — K760 Fatty (change of) liver, not elsewhere classified: Secondary | ICD-10-CM | POA: Insufficient documentation

## 2014-12-15 DIAGNOSIS — C50911 Malignant neoplasm of unspecified site of right female breast: Secondary | ICD-10-CM

## 2014-12-15 DIAGNOSIS — I517 Cardiomegaly: Secondary | ICD-10-CM | POA: Insufficient documentation

## 2014-12-15 DIAGNOSIS — M8580 Other specified disorders of bone density and structure, unspecified site: Secondary | ICD-10-CM

## 2014-12-15 LAB — CANCER ANTIGEN 15-3: Cancer Antigen-Breast 15-3: 4 U/mL (ref <32)

## 2014-12-15 LAB — CANCER ANTIGEN 27.29: CA 27.29: 6.6 U/mL (ref 0.0–38.6)

## 2014-12-15 MED ORDER — IOHEXOL 300 MG/ML  SOLN
75.0000 mL | Freq: Once | INTRAMUSCULAR | Status: AC | PRN
  Administered 2014-12-15: 75 mL via INTRAVENOUS

## 2014-12-17 ENCOUNTER — Encounter (HOSPITAL_COMMUNITY): Payer: Self-pay | Admitting: Hematology & Oncology

## 2014-12-17 ENCOUNTER — Encounter (HOSPITAL_BASED_OUTPATIENT_CLINIC_OR_DEPARTMENT_OTHER): Payer: PPO

## 2014-12-17 ENCOUNTER — Other Ambulatory Visit (HOSPITAL_COMMUNITY): Payer: Self-pay | Admitting: Oncology

## 2014-12-17 ENCOUNTER — Encounter (HOSPITAL_BASED_OUTPATIENT_CLINIC_OR_DEPARTMENT_OTHER): Payer: PPO | Admitting: Hematology & Oncology

## 2014-12-17 VITALS — BP 147/58 | HR 64 | Temp 98.0°F | Resp 18 | Wt 164.5 lb

## 2014-12-17 DIAGNOSIS — M8580 Other specified disorders of bone density and structure, unspecified site: Secondary | ICD-10-CM

## 2014-12-17 DIAGNOSIS — C50911 Malignant neoplasm of unspecified site of right female breast: Secondary | ICD-10-CM | POA: Diagnosis not present

## 2014-12-17 DIAGNOSIS — E559 Vitamin D deficiency, unspecified: Secondary | ICD-10-CM | POA: Diagnosis not present

## 2014-12-17 DIAGNOSIS — R911 Solitary pulmonary nodule: Secondary | ICD-10-CM

## 2014-12-17 DIAGNOSIS — Z853 Personal history of malignant neoplasm of breast: Secondary | ICD-10-CM

## 2014-12-17 DIAGNOSIS — R898 Other abnormal findings in specimens from other organs, systems and tissues: Secondary | ICD-10-CM

## 2014-12-17 MED ORDER — DENOSUMAB 60 MG/ML ~~LOC~~ SOLN
60.0000 mg | Freq: Once | SUBCUTANEOUS | Status: AC
Start: 2014-12-17 — End: 2014-12-17
  Administered 2014-12-17: 60 mg via SUBCUTANEOUS
  Filled 2014-12-17: qty 1

## 2014-12-17 NOTE — Patient Instructions (Addendum)
Amy Houston at [facility] Discharge Instructions  RECOMMENDATIONS MADE BY THE CONSULTANT AND ANY TEST RESULTS WILL BE SENT TO YOUR REFERRING PHYSICIAN.   Exam completed by Dr Whitney Muse today Prolia today Prolia every 6 months  MRI in June Cancer markers were normal Return to see the doctor in 6 months Please call the clinic if you have any questions or concerns   Thank you for choosing [place] at [facility] to provide your oncology and hematology care.  To afford each patient quality time with our provider, please arrive at least 15 minutes before your scheduled appointment time.    You need to re-schedule your appointment should you arrive 10 or more minutes late.  We strive to give you quality time with our providers, and arriving late affects you and other patients whose appointments are after yours.  Also, if you no show three or more times for appointments you may be dismissed from the clinic at the providers discretion.     Again, thank you for choosing [facility].  Our hope is that these requests will decrease the amount of time that you wait before being seen by our physicians.       _____________________________________________________________  Should you have questions after your visit to [facility], please contact our office at [phone] between the hours of 8:30 a.m. and 4:30 p.m.  Voicemails left after 4:30 p.m. will not be returned until the following business day.  For prescription refill requests, have your pharmacy contact our office.

## 2014-12-17 NOTE — Progress Notes (Signed)
Amy Bogus, MD Amy [place] [id number]  Hx Left breast cancer, UOQ, IDC, receptor negative - Plan: CBC with Differential, Comprehensive metabolic panel, Cancer antigen 15-3, Cancer antigen 27.29  CURRENT THERAPY: Aromasin daily beginning on 06/03/2012 and she will take this lifelong due to increased recurrence rate with PALB2 and CHEK2 positivity. Osteopenia, calcium plus vitamin D. Prolia therapy  INTERVAL HISTORY: Amy Houston 69 y.o. female returns for  regular  visit for followup of right sided breast cancer,GRADE II, 1.2 cm invasive ductal carcinoma, ER +99%, PR +72%, grade 2, LV I positive, Ki-67 marker high at 60%, but 4 sentinel lymph nodes negative for metastatic disease. HER-2/neu nonamplified. S/P Bilateral mastectomy on 05/08/2012 by Dr. Osborn Coho. Her Oncotype DX score was 14 giving her recurrence rate of 9% at 5 years of hormonal therapy. With that in mind she was not considered a candidate for sytemic chemotherapy. Now on Aromasin beginning on 06/03/2012 and she will take this lifelong due to her PALB2 And CHEK 2 positivity. Additionally, she is S/P bilateral salpingo-oophorectomy on 01/30/2013. 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 followed by radiation therapy. She was not given tamoxifen. There was some question of Megace one tablet a day but she is not sure of the duration. This may have been for hot flashes which she remembers were severe. She was eventually placed on Effexor for hot flashes but is now on the Effexor for depression.   Amy Houston is here alone and here to discuss the results of her recent Chest CT. She has been doing well and continues to work here at [employer]. She has no dental issues and continues to stay on top of her dental hygiene. Her appetite is good,  "eating everything in sight". She reports she is tired though she knows what she needs to do and wants to begin exercising. She cannot recall when her last chest wall exam was. She has not experienced any problems. Her PCP is Dr. Luan Houston and he continues to follow her cholesterol. Denies bowel problems. She is up to date on her colonoscopies.  She recently found out about her niece's cancer diagnosis, she was found to have lymphoma involving her brain. Since August, two of her brothers have passed away. She has been under a lot of stress dealing with this news. She states that her daughter who had breast cancer is doing well.      Breast cancer, right breast, IDC, Multifocal   03/19/2012 Initial Diagnosis Breast cancer, right breast, IDC, Multifocal   05/08/2012 Surgery Bilateral simple mastectomies by Dr. Margot Chimes   06/03/2012 -  Chemotherapy Aromatase inhibitor.  PALB2 and CHEK2+ and therefore would benefit from lifelong AI therapy.   01/30/2013 Surgery Laparotomy, minimal lysis of adhesions, abdominal hysterectomy, bilateral salpingo-oophorectomy.    Oncologically, the patient denies any complaints and ROS questioning is negative.  Past Medical History  Diagnosis Date  . HTN (hypertension)   . GERD (gastroesophageal reflux disease)     tums  . IBS (irritable bowel syndrome)   . Hemorrhoid   . History of blood transfusion     post child birth  . PPD positive, treated 1999  . Sleep apnea     "gone after I had OR" (05/08/2012)  . Diabetes mellitus 1999    "dx; never  on meds" (05/08/2012)  . H/O hiatal hernia   . Arthritis     "of the spine" (05/08/2012)  . Breast cancer ([place]) 1995    "left" (05/08/2012)  . Breast cancer, right breast, IDC, Multifocal 03/12/2012    Multifocal, 10 and 8 left breast   . Osteopenia 12/11/2012    On Ca++ and Vit D. Bone density in April 2014.  Next bone density due in April 2016.  Marland Kitchen PONV (postoperative nausea and vomiting)   . Exertional shortness of breath   .  Abnormal genetic test 04/09/2013    PALB2 and CHEK2 positivity     has Popliteal cyst; Breast cancer, right breast, IDC, Multifocal; Hx Left breast cancer, UOQ, IDC, receptor negative; GERD (gastroesophageal reflux disease); Adenomatous polyps; Thyroid nodule; Osteopenia; Abnormal genetic test; Acquired trigger finger; and Genetic testing on her problem list.     is allergic to penicillins.  Amy Houston does not currently have medications on file.  Past Surgical History  Procedure Laterality Date  . Tubal ligation  1976  . Mastectomy complete / simple w/ sentinel node biopsy Right 05/08/2012  . Mastectomy complete / simple Left 05/08/2012  . Uvulopalatopharyngoplasty, tonsillectomy and septoplasty  ~ 2008  . Cholecystectomy  1992  . Dilation and curettage of uterus  1968  . Tonsillectomy and adenoidectomy  ~ 2008  . Breast lumpectomy Left 1995  . Breast biopsy Left 1995  . Breast biopsy Bilateral 03/2012    "one on the left; 2 on the right" (05/08/2012)  . Simple mastectomy with axillary sentinel node biopsy Right 05/08/2012    Procedure: RIGHT TOTAL MASTECTOMY WITH AXILLARY SENTINEL NODE BIOPSY;  Surgeon: Haywood Lasso, MD;  Location: [facility];  Service: General;  Laterality: Right;  . Simple mastectomy with axillary sentinel node biopsy Left 05/08/2012    Procedure: LEFT TOTAL MASTECTOMY;  Surgeon: Haywood Lasso, MD;  Location: [facility];  Service: General;  Laterality: Left;  . Colonoscopy  12/06/93    Dr. Hayes:single diminutive polyp of the descending colon/extrernal hemorrhoids, benign path  . Colonoscopy  11/30/98    Dr Hayes:small rectal polyp/internal hemorrhoids, benign path  . Colonoscopy  11/26/2006    Dr. Hayes:sigmoid polyp/small interal hemorrhoids, adenomatous  . Esophagogastroduodenoscopy  1994    Dr. Cristina Gong: small hiatal hernia and Schatzki's ring widely patent, CLO test negative  . Colonoscopy with esophagogastroduodenoscopy (egd) N/A 07/01/2012    Procedure: COLONOSCOPY  WITH ESOPHAGOGASTRODUODENOSCOPY (EGD);  Surgeon: Danie Binder, MD;  [facility];  Service: Endoscopy;  Laterality: N/A;  12:30-rescheduled to [place] notified pt  . Esophageal dilation  07/01/2012    Procedure: ESOPHAGEAL DILATION;  Surgeon: Danie Binder, MD;  [facility];  Service: Endoscopy;;  . Esophagogastroduodenoscopy (egd) with esophageal dilation N/A 07/23/2012    Procedure: ESOPHAGOGASTRODUODENOSCOPY (EGD) WITH ESOPHAGEAL DILATION;  Surgeon: Danie Binder, MD;  [facility];  Service: Endoscopy;  Laterality: N/A;  8:45  . Salpingoophorectomy Bilateral 01/30/2013    Procedure: SALPINGO OOPHORECTOMY;  Surgeon: Melina Schools, MD;  Location: [facility];  Service: Gynecology;  Laterality: Bilateral;  . Abdominal hysterectomy Bilateral 01/30/2013    Procedure: HYSTERECTOMY ABDOMINAL;  Surgeon: Melina Schools, MD;  Location: [facility];  Service: Gynecology;  Laterality: Bilateral;   FAMILY HISTORY UPDATE:  2 daughters, both received her cancer gene, youngest daughter diagnosed with breast cancer last month, oldest regularly seen at [facility] for breast cancer Sister received one of her 2 abnormality genes  however, she is completely normal   ROS Denies any headaches, dizziness, double vision, fevers, chills, night sweats, nausea, vomiting, diarrhea, constipation, chest pain, heart palpitations, shortness of breath, blood in stool, black tarry stool, urinary pain, urinary burning, urinary frequency, hematuria.  14 point review of systems was performed and is negative except as detailed under history of present illness and above  PHYSICAL EXAMINATION  ECOG PERFORMANCE STATUS: 0 - Asymptomatic  Filed Vitals:   12/17/14 1400  BP: 147/58  Pulse: 64  Temp: 98 F (36.7 C)  Resp: 18    GENERAL:alert, no distress, well nourished, well developed, comfortable, cooperative and smiling SKIN: skin color, texture, turgor are normal, no rashes or  significant lesions HEAD: Normocephalic, No masses, lesions, tenderness or abnormalities EYES: normal, PERRLA, EOMI, Conjunctiva are pink and non-injected EARS: External ears normal OROPHARYNX:mucous membranes are moist  NECK: supple, no adenopathy, thyroid normal size, non-tender, without nodularity, no stridor, non-tender, trachea midline LYMPH:  no palpable lymphadenopathy in the neck, axillae or supraclavicular regions. BREAST:B/L mastectomy sites well healed and free of suspicious changes. More scar tissue on the left chest wall than the right. Prior radiation changes to the left chest wall. No palpable nodularity no suspicious skin changes LUNGS: clear to auscultation and percussion HEART: regular rate & rhythm, no murmurs, no gallops, S1 normal and S2 normal Subtle systolic ejection murmur ABDOMEN:abdomen soft, non-tender, obese, normal bowel sounds, no masses or organomegaly and no hepatosplenomegaly BACK: Back symmetric, no curvature., No CVA tenderness EXTREMITIES:less then 2 second capillary refill, no joint deformities, effusion, or inflammation, no edema, no skin discoloration, no clubbing, no cyanosis  NEURO: alert & oriented x 3 with fluent speech, no focal motor/sensory deficits, gait normal   LABORATORY DATA: I have reviewed the data as listed.  CBC    Component Value Date/Time   WBC 6.9 12/14/2014 1310   RBC 4.11 12/14/2014 1310   HGB 13.0 12/14/2014 1310   HCT 39.3 12/14/2014 1310   PLT 299 12/14/2014 1310   MCV 95.6 12/14/2014 1310   MCH 31.6 12/14/2014 1310   MCHC 33.1 12/14/2014 1310   RDW 12.7 12/14/2014 1310   LYMPHSABS 2.3 12/14/2014 1310   MONOABS 0.5 12/14/2014 1310   EOSABS 0.0 12/14/2014 1310   BASOSABS 0.0 12/14/2014 1310      Chemistry      Component Value Date/Time   NA 140 12/14/2014 1310   K 4.0 12/14/2014 1310   CL 106 12/14/2014 1310   CO2 26 12/14/2014 1310   BUN 21* 12/14/2014 1310   CREATININE 1.12* 12/14/2014 1310      Component  Value Date/Time   CALCIUM 10.2 12/14/2014 1310   ALKPHOS 38 12/14/2014 1310   AST 20 12/14/2014 1310   ALT 23 12/14/2014 1310   BILITOT 0.7 12/14/2014 1310      RADIOGRAPHIC STUDIES: I have reviewed the above documentation for accuracy and completeness, and I agree with the above.  CLINICAL DATA: Followup of pulmonary nodules. History of breast cancer in 1995 and 2014.  EXAM: CT CHEST WITH CONTRAST  TECHNIQUE: Multidetector CT imaging of the chest was performed during intravenous contrast administration.  CONTRAST: 99m OMNIPAQUE IOHEXOL 300 MG/ML SOLN  COMPARISON: 12/12/2013  FINDINGS: Mediastinum/Nodes: No supraclavicular adenopathy. Bilateral mastectomy and bilateral axillary node dissection. No axillary adenopathy. Mild cardiomegaly with LAD Coronary artery atherosclerosis. Aortic and branch vessel atherosclerosis. No central pulmonary embolism, on this non-dedicated study. No mediastinal or hilar adenopathy.  Lungs/Pleura: Subpleural 3 mm posterior left upper lobe pulmonary  nodule on image 12/ series 3 is unchanged.  A vague right upper lobe 6 mm nodule on image 17/ series 3 is also similar.  No new or enlarging pulmonary nodules. No lobar consolidation.  Upper abdomen: Cholecystectomy. Normal imaged portions of the is spleen, stomach, pancreas, adrenal glands, kidneys. Mild hepatic steatosis.  Musculoskeletal: No acute osseous abnormality. A hemangioma is again identified within the T8 vertebral body.  IMPRESSION: 1. Status post bilateral mastectomy and axillary node dissection. No acute process or evidence of metastatic disease in the chest. 2. Similar pulmonary nodules, which can be presumed to be benign based on ongoing stability. 3. Atherosclerosis, including within the coronary arteries. 4. Mild hepatic steatosis.   Electronically Signed  By: Abigail Miyamoto M.D.  On: 12/15/2014 17:03    ASSESSMENT:  1. Right sided breast  cancer,GRADE II, 1.2 cm invasive ductal carcinoma, ER +99%, PR +72%, grade 2, LV I positive, Ki-67 marker high at 60%, but 4 sentinel lymph nodes negative for metastatic disease. HER-2/neu nonamplified. S/P Bilateral mastectomy on 05/08/2012 by Dr. Osborn Coho. Her Oncotype DX score was 14 giving her recurrence rate of 9% at 5 years of hormonal therapy. With that in mind she was not considered a candidate for sytemic chemotherapy. Now on Aromasin beginning on 06/03/2012 and she will take this lifelong due to her PALB2 And CHEK 2 positivity. S/P bilateral salpingo-oophorectomy on 01/30/2013. 2. PALB2 + mutation  3. CHEK2 + mutation  4. left-sided breast cancer 1996, treated with lumpectomy followed by chemotherapy consisting of CMF for 8 cycles. followed by radiation therapy. She was not given tamoxifen. There was some question of Megace one tablet a day but she is not sure of the duration. This may have been for hot flashes which she remembers were severe. She was eventually placed on Effexor for hot flashes but is now on the Effexor for depression.  5. Vitamin D deficiency on replacement  6. Osteopenia, next bone density due in April 2018. On Ca++ and Vit D. Prolia therapy  7. Pulmonary Nodules stable on imaging for greater than 2 years therefore presumed benign based on ongoing stability  Patient Active Problem List   Diagnosis Date Noted  . Genetic testing 09/10/2014  . Acquired trigger finger 05/06/2013  . Abnormal genetic test 04/09/2013  . Osteopenia 12/11/2012  . Thyroid nodule 06/21/2012  . GERD (gastroesophageal reflux disease) 06/03/2012  . Adenomatous polyps 06/03/2012  . Breast cancer, right breast, IDC, Multifocal 03/12/2012  . Popliteal cyst 08/18/2010  . Hx Left breast cancer, UOQ, IDC, receptor negative 11/08/1993    PLAN:  1. I personally reviewed and went over laboratory results with the patient.  The results are noted within this dictation.   2. Chart reviewed 3. Bone  density is due in April 2018. 4. Continue Aromasin daily.  Compliance encouraged.  She will require this lifelong due to increased risk of recurrence with PALB2 and CHEK2 mutations  5. Prolia for osteopenia and ongoing AI therapy 6. Return in 6 months for follow-up. 7. Continued to encourage ongoing weight loss. Also encouraged increasing physical activity  I have re-ordered her chest MRI for June which may or may not be approved. She understands that with bilateral mastectomies no additional imaging is realistically required.  We discussed the results of her recent Chest CT scan.  I will see her again in June.   THERAPY PLAN:  Encouraged compliance with Aromasin which we recommend at this time she continue lifelong due to [place] and CHEK2 positivity. We  will follow NCCN guidelines pertaining to surveillance.   NCCN guidelines recommends the following surveillance for invasive breast cancer:  A. History and Physical exam every 4-6 months for 5 years and then every 12 months.  B. Mammography every 12 months  C. Women on Tamoxifen: annual gynecologic assessment every 12 months if uterus is present.  D. Women on aromatase inhibitor or who experience ovarian failure secondary to treatment should have monitoring of bone health with a bone mineral density determination at baseline and periodically thereafter.  E. Assess and encourage adherence to adjuvant endocrine therapy.  F. Evidence suggests that active lifestyle and achieving and maintaining an ideal body weight (20-25 BMI) may lead to optimal breast cancer outcomes.  All questions were answered. The patient knows to call the clinic with any problems, questions or concerns. We can certainly see the patient much sooner if necessary.  This document serves as a record of services personally performed by Ancil Linsey, MD. It was created on her behalf by Arlyce Harman, a trained medical scribe. The creation of this record is based on the  scribe's personal observations and the provider's statements to them. This document has been checked and approved by the attending provider.  I have reviewed the above documentation for accuracy and completeness, and I agree with the above.  Kelby Fam. Whitney Muse, MD

## 2014-12-17 NOTE — Progress Notes (Signed)
Amy Houston presents today for injection per MD orders. Prolia 60 mg administered SQ in left Abdomen. Administration without incident. Patient tolerated well.

## 2015-01-01 ENCOUNTER — Other Ambulatory Visit (HOSPITAL_COMMUNITY)
Admission: RE | Admit: 2015-01-01 | Discharge: 2015-01-01 | Disposition: A | Discharge status: Home / Self Care | Payer: PPO | Source: Ambulatory Visit | Attending: Pulmonary Disease | Admitting: Pulmonary Disease

## 2015-01-01 DIAGNOSIS — N390 Urinary tract infection, site not specified: Secondary | ICD-10-CM | POA: Diagnosis present

## 2015-01-01 LAB — URINALYSIS, ROUTINE W REFLEX MICROSCOPIC
Bilirubin Urine: NEGATIVE
Glucose, UA: NEGATIVE mg/dL
Hgb urine dipstick: NEGATIVE
KETONES UR: NEGATIVE mg/dL
Leukocytes, UA: NEGATIVE
NITRITE: NEGATIVE
PH: 5.5 (ref 5.0–8.0)
PROTEIN: NEGATIVE mg/dL
SPECIFIC GRAVITY, URINE: 1.02 (ref 1.005–1.030)

## 2015-01-02 LAB — URINE CULTURE

## 2015-01-08 ENCOUNTER — Other Ambulatory Visit (HOSPITAL_COMMUNITY)
Admission: RE | Admit: 2015-01-08 | Discharge: 2015-01-08 | Disposition: A | Discharge status: Home / Self Care | Payer: PPO | Source: Ambulatory Visit | Attending: Pulmonary Disease | Admitting: Pulmonary Disease

## 2015-01-08 DIAGNOSIS — N390 Urinary tract infection, site not specified: Secondary | ICD-10-CM | POA: Insufficient documentation

## 2015-01-10 LAB — URINE CULTURE

## 2015-01-22 ENCOUNTER — Encounter: Payer: Self-pay | Admitting: Gastroenterology

## 2015-01-22 ENCOUNTER — Ambulatory Visit (INDEPENDENT_AMBULATORY_CARE_PROVIDER_SITE_OTHER): Payer: PPO | Admitting: Gastroenterology

## 2015-01-22 ENCOUNTER — Other Ambulatory Visit: Payer: Self-pay

## 2015-01-22 ENCOUNTER — Telehealth: Payer: Self-pay | Admitting: Gastroenterology

## 2015-01-22 ENCOUNTER — Ambulatory Visit (HOSPITAL_COMMUNITY)
Admission: RE | Admit: 2015-01-22 | Discharge: 2015-01-22 | Disposition: A | Discharge status: Home / Self Care | Payer: PPO | Source: Ambulatory Visit | Attending: Gastroenterology | Admitting: Gastroenterology

## 2015-01-22 VITALS — BP 143/71 | HR 81 | Temp 97.7°F | Ht 60.0 in | Wt 164.6 lb

## 2015-01-22 DIAGNOSIS — R933 Abnormal findings on diagnostic imaging of other parts of digestive tract: Secondary | ICD-10-CM | POA: Insufficient documentation

## 2015-01-22 DIAGNOSIS — R1032 Left lower quadrant pain: Secondary | ICD-10-CM | POA: Insufficient documentation

## 2015-01-22 DIAGNOSIS — N281 Cyst of kidney, acquired: Secondary | ICD-10-CM | POA: Insufficient documentation

## 2015-01-22 LAB — POCT I-STAT CREATININE: Creatinine, Ser: 0.9 mg/dL (ref 0.44–1.00)

## 2015-01-22 MED ORDER — IOHEXOL 300 MG/ML  SOLN
100.0000 mL | Freq: Once | INTRAMUSCULAR | Status: AC | PRN
  Administered 2015-01-22: 100 mL via INTRAVENOUS

## 2015-01-22 MED ORDER — METRONIDAZOLE 500 MG PO TABS: 500.0000 mg | ORAL_TABLET | Freq: Three times a day (TID) | ORAL | Status: DC

## 2015-01-22 MED ORDER — CIPROFLOXACIN HCL 500 MG PO TABS: 500.0000 mg | ORAL_TABLET | Freq: Two times a day (BID) | ORAL | Status: DC

## 2015-01-22 MED ORDER — ELUXADOLINE 75 MG PO TABS: 1.0000 | ORAL_TABLET | Freq: Two times a day (BID) | ORAL | Status: DC

## 2015-01-22 MED ORDER — ELUXADOLINE 75 MG PO TABS
1.0000 | ORAL_TABLET | Freq: Two times a day (BID) | ORAL | Status: DC
Start: 2015-01-22 — End: 2015-03-08

## 2015-01-22 NOTE — Telephone Encounter (Signed)
Patient seen on 1/20 by myself. Stat CT revealed mild diverticulitis. She is being treated.  Please have her return in 6 weeks to see me. Thanks!

## 2015-01-22 NOTE — Patient Instructions (Signed)
Please have CT done today.   I will review and call with results. You may leave after the CT.   If it is normal, we will have you start taking Viberzi 1 tablet each morning with food. We may be able to increase this to twice a day. Call me if no bowel movement in 4 days.

## 2015-01-22 NOTE — Progress Notes (Signed)
Referring Provider: [name], MD Primary Care Physician:  [name], MD  Primary GI: Dr. Oneida Alar   Chief Complaint  Patient presents with  . Abdominal Pain    Lower left  . Bloated    HPI:   Amy Houston is a 70 y.o. female presenting today with a history of breast cancer, s/p bilateral mastectomy May 2014. Genetic testing revealed elevated risk for pancreatic and colorectal cancer. She underwent colonoscopy 2014 with 2 small adenomas, due for surveillance 2019. EGD at that time with dilation due to proximal esophageal web and Schatzki's ring. She had repeat EGD with dilation again in July 2014.   Presents today due to abdominal pain. Had significant abdominal pain right after eating salad, salmon. Sometimes salads will trigger IBS. Fried foods will trigger.  LLQ cramping, ran to bathroom and had small amount of diarrhea but didn't feel like she had gone enough. Didn't feel productive. Has had several other episodes. Has had occasional constipation. Bloating. Pressure in abdomen. Currently, she now has an underlying lower abdominal discomfort. Symptom onset between Christmas and New Years. Hysterectomy in Jan 2015. Was told she had a lot of scar tissue in abdomen at time of hysterectomy. Currently has looser stool, watery stool once or twice a week. Sometimes will go 3-4 times a day, then maybe skip a day with "nothing". Some mild cramping with bowel movements with some relief. No hematochezia. No lack of appetite. Has had some rare nausea. Has to clear throat after eating occasionally but no solid food dysphagia. Prilosec once daily. No fever. Rare chill.   Has had 2 UAs with "multiple species detected".   Past Medical History  Diagnosis Date  . HTN (hypertension)   . GERD (gastroesophageal reflux disease)     tums  . IBS (irritable bowel syndrome)   . Hemorrhoid   . History of blood transfusion     post child birth  . PPD positive, treated 1999  . Sleep apnea    "gone after I had OR" (05/08/2012)  . Diabetes mellitus 1999    "dx; never on meds" (05/08/2012)  . H/O hiatal hernia   . Arthritis     "of the spine" (05/08/2012)  . Breast cancer ([place]) 1995    "left" (05/08/2012)  . Breast cancer, right breast, IDC, Multifocal 03/12/2012    Multifocal, 10 and 8 left breast   . Osteopenia 12/11/2012    On Ca++ and Vit D. Bone density in April 2014.  Next bone density due in April 2016.  Marland Kitchen PONV (postoperative nausea and vomiting)   . Exertional shortness of breath   . Abnormal genetic test 04/09/2013    PALB2 and CHEK2 positivity     Past Surgical History  Procedure Laterality Date  . Tubal ligation  1976  . Mastectomy complete / simple w/ sentinel node biopsy Right 05/08/2012  . Mastectomy complete / simple Left 05/08/2012  . Uvulopalatopharyngoplasty, tonsillectomy and septoplasty  ~ 2008  . Cholecystectomy  1992  . Dilation and curettage of uterus  1968  . Tonsillectomy and adenoidectomy  ~ 2008  . Breast lumpectomy Left 1995  . Breast biopsy Left 1995  . Breast biopsy Bilateral 03/2012    "one on the left; 2 on the right" (05/08/2012)  . Simple mastectomy with axillary sentinel node biopsy Right 05/08/2012    Procedure: RIGHT TOTAL MASTECTOMY WITH AXILLARY SENTINEL NODE BIOPSY;  Surgeon: Haywood Lasso, MD;  Location: [facility];  Service: General;  Laterality: Right;  .  Simple mastectomy with axillary sentinel node biopsy Left 05/08/2012    Procedure: LEFT TOTAL MASTECTOMY;  Surgeon: Haywood Lasso, MD;  Location: [facility];  Service: General;  Laterality: Left;  . Colonoscopy  12/06/93    Dr. Hayes:single diminutive polyp of the descending colon/extrernal hemorrhoids, benign path  . Colonoscopy  11/30/98    Dr Hayes:small rectal polyp/internal hemorrhoids, benign path  . Colonoscopy  11/26/2006    Dr. Hayes:sigmoid polyp/small interal hemorrhoids, adenomatous  . Esophagogastroduodenoscopy  1994    Dr. Cristina Gong: small hiatal hernia and Schatzki's ring widely  patent, CLO test negative  . Colonoscopy with esophagogastroduodenoscopy (egd) N/A 07/01/2012    Dr. Oneida Alar: 2 small adenomas, sigmoid colon diverticula, surveillance in 2019  . Esophageal dilation  07/01/2012    Dr. Oneida Alar: proximal esophageal web, Schatzki's ring at GE junction, s/p dilaiton. Mild non-erosive gastritis. Repeat EGD July 23, 2012.   Marland Kitchen Esophagogastroduodenoscopy (egd) with esophageal dilation N/A 07/23/2012    Dr. Fields:proximal esophageal web v. radiation induced stricture/schatzki ring/small HH, s/p dilation  . Salpingoophorectomy Bilateral 01/30/2013    Procedure: SALPINGO OOPHORECTOMY;  Surgeon: Melina Schools, MD;  Location: [facility];  Service: Gynecology;  Laterality: Bilateral;  . Abdominal hysterectomy Bilateral 01/30/2013    Procedure: HYSTERECTOMY ABDOMINAL;  Surgeon: Melina Schools, MD;  Location: [facility];  Service: Gynecology;  Laterality: Bilateral;    Current Outpatient Prescriptions  Medication Sig Dispense Refill  . Calcium Carbonate-Vitamin D (CALCIUM 600+D3 PO) Take 1 tablet by mouth 2 (two) times daily.    . cholecalciferol (VITAMIN D) 1000 UNITS tablet Take 2,000 Units by mouth 2 (two) times daily.     Marland Kitchen exemestane (AROMASIN) 25 MG tablet TAKE (1) TABLET ONCE DAILY IN THE MORNING, AFTER BREAKFAST. 30 tablet 5  . lisinopril (PRINIVIL,ZESTRIL) 20 MG tablet Take 20 mg by mouth daily.    . metoprolol succinate (TOPROL-XL) 25 MG 24 hr tablet Take 25 mg by mouth daily.    . Multiple Vitamin (MULTIVITAMIN WITH MINERALS) TABS Take 1 tablet by mouth daily.    Marland Kitchen omeprazole (PRILOSEC) 20 MG capsule TAKE ONE CAPSULE BY MOUTH ONCE DAILY. 30 capsule 5  . oxybutynin (DITROPAN-XL) 5 MG 24 hr tablet Take 5 mg by mouth at bedtime.  1  . venlafaxine (EFFEXOR) 50 MG tablet Take 50 mg by mouth 2 (two) times daily.    . ciprofloxacin (CIPRO) 500 MG tablet Take 1 tablet (500 mg total) by mouth 2 (two) times daily. 20 tablet 0  . Eluxadoline (VIBERZI) 75 MG TABS Take 1 tablet by mouth  2 (two) times daily with a meal. 60 tablet 0  . metroNIDAZOLE (FLAGYL) 500 MG tablet Take 1 tablet (500 mg total) by mouth 3 (three) times daily. 30 tablet 0   No current facility-administered medications for this visit.    Allergies as of 01/22/2015 - Review Complete 01/22/2015  Allergen Reaction Noted  . Penicillins Rash 08/18/2010    Family History  Problem Relation Age of Onset  . Lung cancer Mother 31    smoker  . Leukemia Father 67    Hairy Cell at 22, CLL at 100  . Lymphoma Father 55    Hodgkins lymphoma  . Lung cancer Sister 78  . Thyroid cancer Brother 46    Medullary  . Lymphoma Brother 60    Follicular lymphoma  . Breast cancer Maternal Aunt     mother's paternal half sister  . Lung cancer Maternal Uncle     mother's paternal half  brother  . Lung cancer Maternal Grandmother     smoker  . Cancer Paternal Uncle     oral cancer dx in his 83s  . Other Daughter     leiomyoma of esophagus  . Colon cancer Maternal Aunt     mother's maternal half sister  . Lung cancer Cousin     paternal cousin  . Breast cancer Cousin     paternal cousin died in her 50s    Social History   Social History  . Marital Status: Married    Spouse Name: N/A  . Number of Children: 2  . Years of Education: 12th grade   Occupational History  . 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 Smoking status: Never Smoker   . Smokeless tobacco: Never Used  . Alcohol Use: Yes     Comment: 05/08/2012 "margarita or glass of wine once or twice/yr"  . Drug Use: No  . Sexual Activity: No   Other Topics Concern  . None   Social History Narrative    Review of Systems: As mentioned in HPI   Physical Exam: BP 143/71 mmHg  Pulse 81  Temp(Src) 97.7 F (36.5 C)  Ht 5' (1.524 m)  Wt 164 lb 9.6 oz (74.662 kg)  BMI 32.15 kg/m2 General:   Alert and oriented. No distress noted. Pleasant and cooperative.  Head:  Normocephalic and atraumatic. Eyes:  Conjuctiva clear without  scleral icterus. Mouth:  Oral mucosa pink and moist. Good dentition. No lesions. Heart:  S1, S2 present without murmurs, rubs, or gallops. Regular rate and rhythm. Abdomen:  +BS, soft, TTP LLQ and non-distended. No rebound or guarding. No HSM or masses noted. Msk:  Symmetrical without gross deformities. Normal posture. Extremities:  Without edema. Neurologic:  Alert and  oriented x4;  grossly normal neurologically. Psych:  Alert and cooperative. Normal mood and affect.

## 2015-01-22 NOTE — Progress Notes (Signed)
Quick Note:  Mild diverticulitis on CT. I have sent Cipro and Flagyl to the pharmacy to take for 10 days. Discussed with patient. ______

## 2015-01-24 IMAGING — CT CT CHEST W/O CM
2 of 3 series · 15 of 36 positions shown, 18 images · non-contrast
Comparison: Chest CT 06/10/2012 and 12/28/2005. Bone scan
06/12/2012.

CLINICAL DATA: Follow up pulmonary nodule. History of right breast
cancer with double mastectomy.

EXAM:
CT CHEST WITHOUT CONTRAST
TECHNIQUE: Multidetector CT imaging of the chest was performed following the
standard protocol without IV contrast.

[Series 2: chestroutine 5.0 b40f · axial · 0.69mm/px · z∈[-254,-30]mm · 12 of 53 slices shown, 15 images]
[im 4/53  mediastinal]
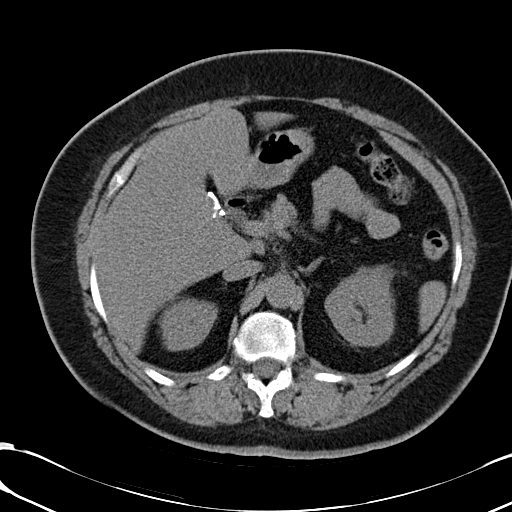
[im 4/53  lung]
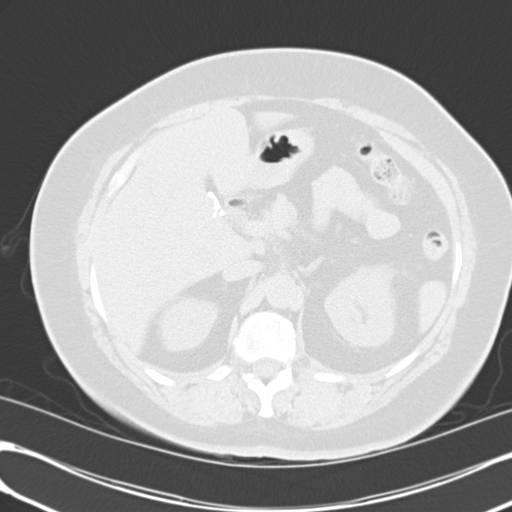
[im 8/53  lung]
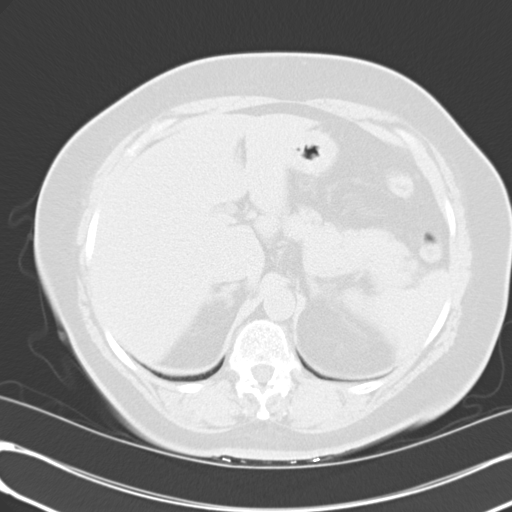
[im 12/53  lung]
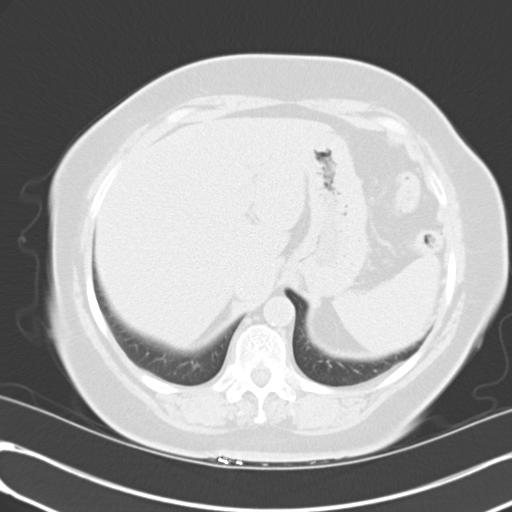
[im 16/53  lung]
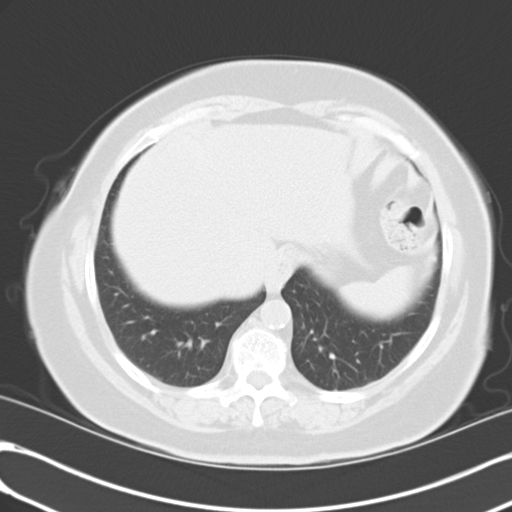
[im 20/53  mediastinal]
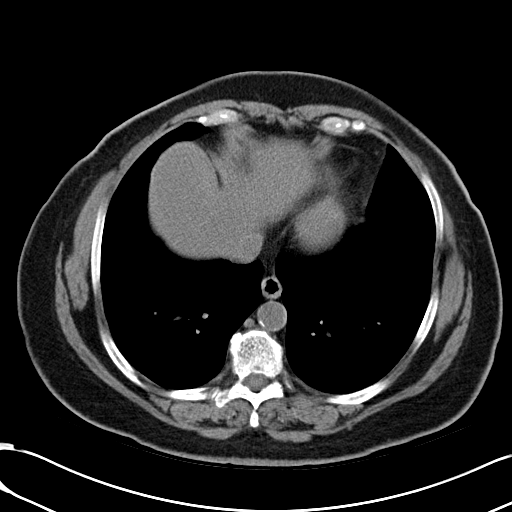
[im 20/53  lung]
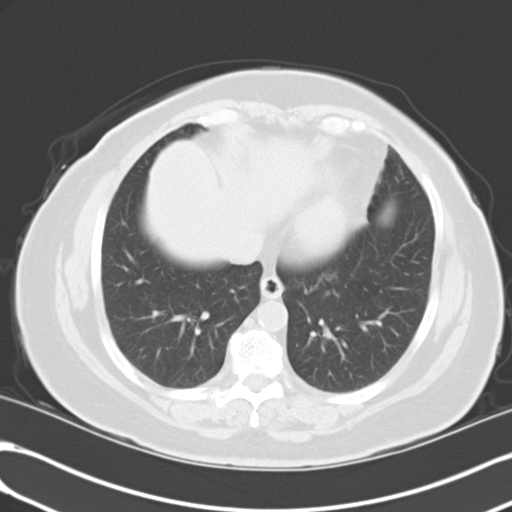
[im 24/53  lung]
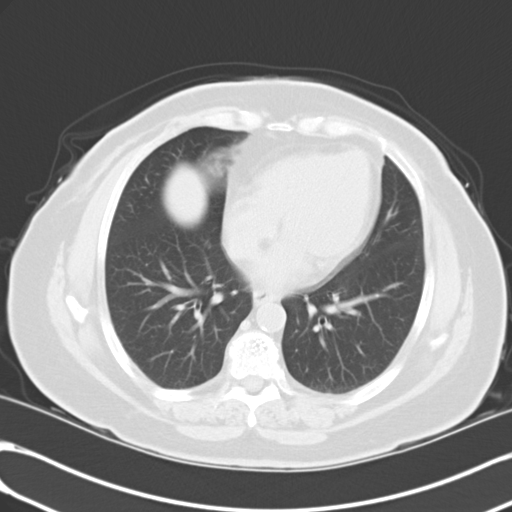
[im 29/53  lung]
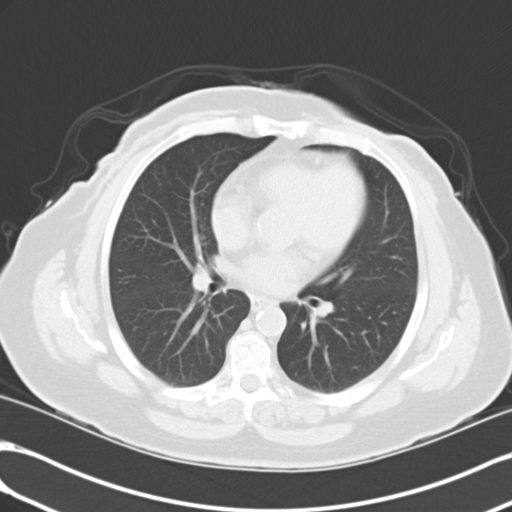
[im 33/53  lung]
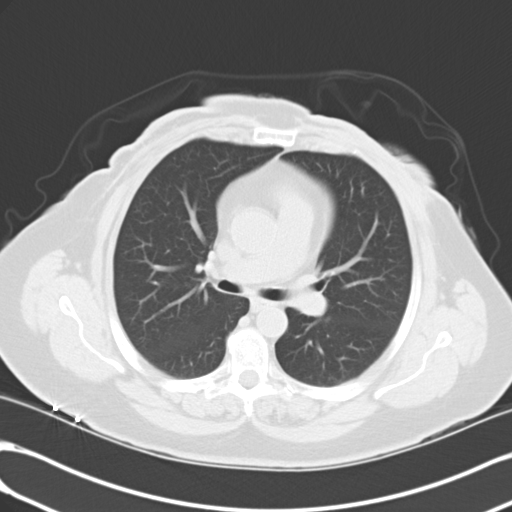
[im 37/53  mediastinal]
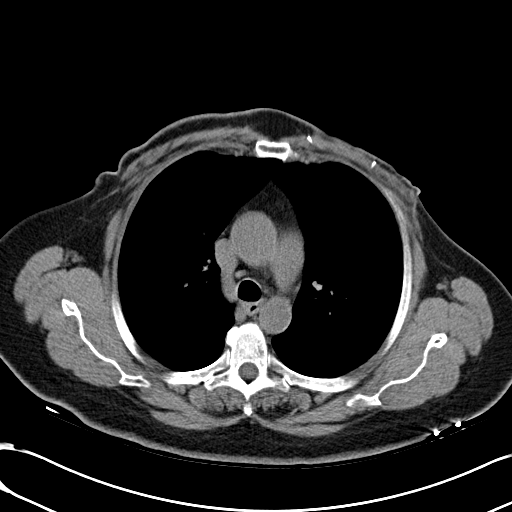
[im 37/53  lung]
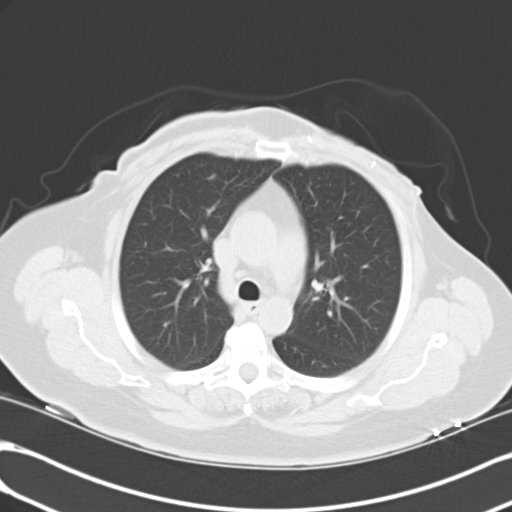
[im 41/53  lung]
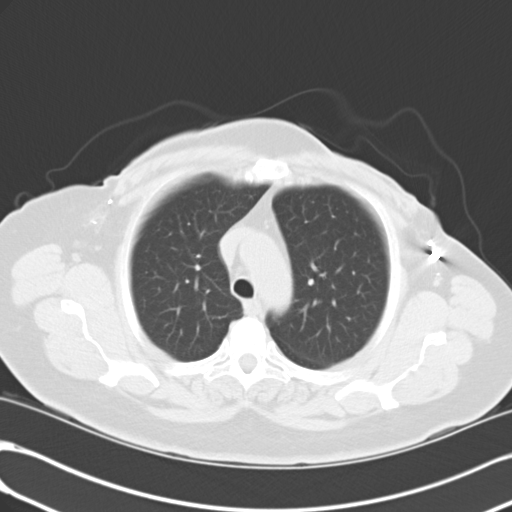
[im 45/53  lung]
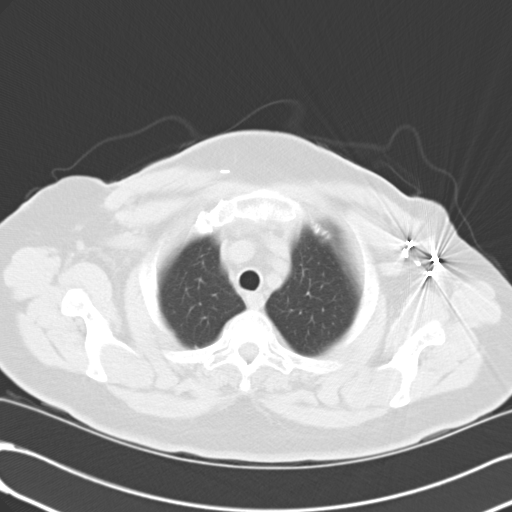
[im 49/53  lung]
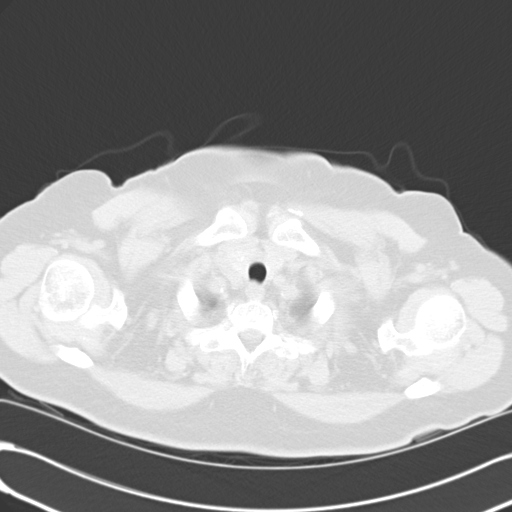

[Series 4: mpr coro 3mm · coronal · 0.51mm/px · 3 of 87 slices shown]
[im 18/87  lung]
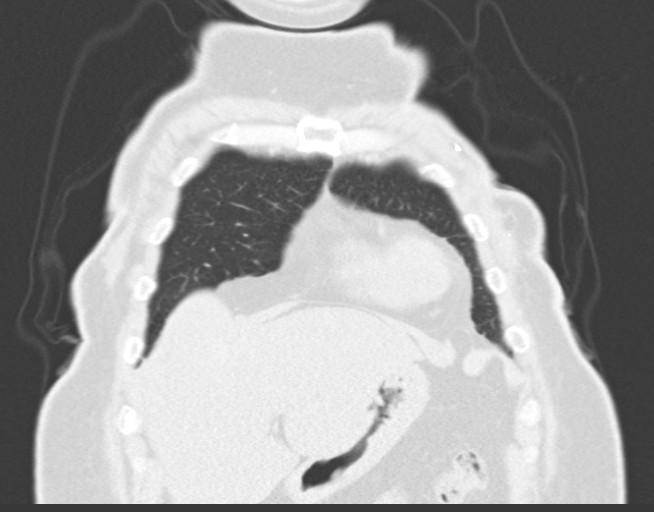
[im 35/87  lung]
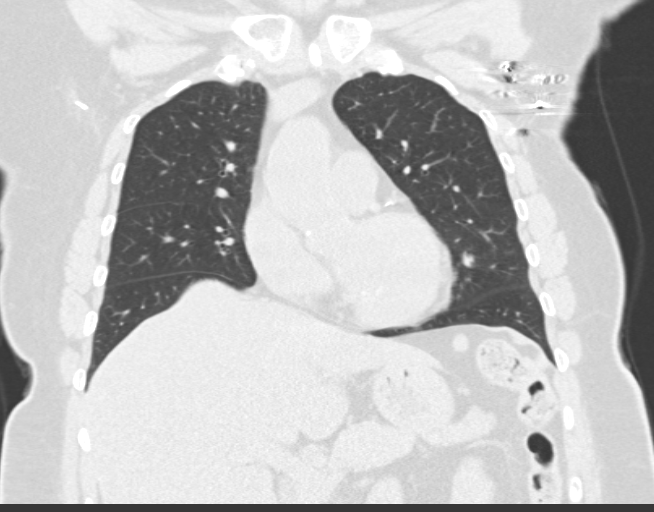
[im 52/87  lung]
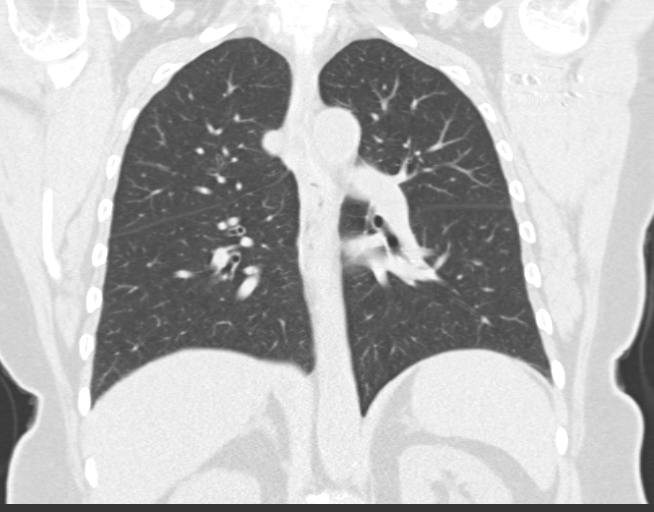

[15 of 36 positions shown; findings below may reference images not displayed]

FINDINGS: As evaluated in the noncontrast state, the mediastinum has a stable
appearance. No pathologically enlarged mediastinal, hilar or
internal mammary lymph nodes are identified. There is coronary
artery atherosclerosis.

Postsurgical changes and fluid collections in both axilla have
improved. There is no adenopathy or focal soft tissue mass.

There is no pleural or pericardial effusion. The 2 mm subpleural
nodule in the right upper lobe on image 19 is stable. The
triangular-shaped nodular density more anteriorly in the right upper
lobe on images 17 and 18 is also stable. There is a focal
ground-glass opacity measuring 6 mm in the right upper lobe on image
16. There is a 3 mm left upper lobe nodule on image 11. No new or
enlarging pulmonary nodules are identified.

The visualized upper abdomen is notable for moderate hepatic
steatosis and prior cholecystectomy. There is no adrenal mass. There
are no worrisome osseous findings.
IMPRESSION: 1. Interval resolution of fluid collections in both axilla. No chest
wall mass or axillary adenopathy.
2. Stable small pulmonary nodules bilaterally. These do not appear
significantly changed from the original study and are felt to be
benign.
3. No new findings demonstrated.

## 2015-01-25 ENCOUNTER — Ambulatory Visit (HOSPITAL_COMMUNITY)
Admission: RE | Admit: 2015-01-25 | Discharge: 2015-01-25 | Disposition: A | Discharge status: Home / Self Care | Payer: PPO | Source: Ambulatory Visit | Attending: Pulmonary Disease | Admitting: Pulmonary Disease

## 2015-01-25 ENCOUNTER — Other Ambulatory Visit (HOSPITAL_COMMUNITY): Payer: Self-pay | Admitting: Pulmonary Disease

## 2015-01-25 ENCOUNTER — Encounter: Payer: Self-pay | Admitting: Gastroenterology

## 2015-01-25 ENCOUNTER — Other Ambulatory Visit (HOSPITAL_COMMUNITY)
Admission: RE | Admit: 2015-01-25 | Discharge: 2015-01-25 | Disposition: A | Discharge status: Home / Self Care | Payer: PPO | Source: Ambulatory Visit | Attending: Pulmonary Disease | Admitting: Pulmonary Disease

## 2015-01-25 DIAGNOSIS — M47896 Other spondylosis, lumbar region: Secondary | ICD-10-CM | POA: Insufficient documentation

## 2015-01-25 DIAGNOSIS — M5442 Lumbago with sciatica, left side: Secondary | ICD-10-CM | POA: Diagnosis not present

## 2015-01-25 DIAGNOSIS — E119 Type 2 diabetes mellitus without complications: Secondary | ICD-10-CM | POA: Insufficient documentation

## 2015-01-25 DIAGNOSIS — M545 Low back pain: Secondary | ICD-10-CM | POA: Diagnosis not present

## 2015-01-25 DIAGNOSIS — I10 Essential (primary) hypertension: Secondary | ICD-10-CM | POA: Diagnosis not present

## 2015-01-25 DIAGNOSIS — M5136 Other intervertebral disc degeneration, lumbar region: Secondary | ICD-10-CM | POA: Insufficient documentation

## 2015-01-25 DIAGNOSIS — M1288 Other specific arthropathies, not elsewhere classified, other specified site: Secondary | ICD-10-CM | POA: Diagnosis not present

## 2015-01-25 DIAGNOSIS — K5792 Diverticulitis of intestine, part unspecified, without perforation or abscess without bleeding: Secondary | ICD-10-CM | POA: Insufficient documentation

## 2015-01-25 LAB — CK: Total CK: 55 U/L (ref 38–234)

## 2015-01-25 NOTE — Telephone Encounter (Signed)
APPT MADE AND LETTER SENT  °

## 2015-01-25 NOTE — Assessment & Plan Note (Signed)
70 year old female with new onset, aggravating and persistent LLQ that waxes and wanes, associated with intermittent loose stool (which is chronic symptom). Her physical exam reveals mild LLQ tender to palpation. Proceeding with CT today. If negative, start Viberzi 75 mg once to twice po BID with meals. Check ifobt as well in future due to genetic risk of colon cancer. Last colonoscopy 2014 and due again in 2019..   Addendum: CT scan completed on 1/20 evening revealing mild, uncomplicated diverticulitis. Cipro and Flagyl sent to pharmacy for 10 days. Hold on Viberzi at this time. However, if she completes antibiotics and still has chronic loose stool, could consider starting. Hold on ifobt for a few weeks, but we will still need this in the future. Return March 08, 2015 for follow-up.

## 2015-01-26 LAB — HEMOGLOBIN A1C
Hgb A1c MFr Bld: 6.8 % — ABNORMAL HIGH (ref 4.8–5.6)
MEAN PLASMA GLUCOSE: 148 mg/dL

## 2015-01-27 NOTE — Progress Notes (Signed)
CC'D TO PCP °

## 2015-02-03 ENCOUNTER — Ambulatory Visit (INDEPENDENT_AMBULATORY_CARE_PROVIDER_SITE_OTHER): Payer: PPO

## 2015-02-03 DIAGNOSIS — R109 Unspecified abdominal pain: Secondary | ICD-10-CM

## 2015-02-03 LAB — IFOBT (OCCULT BLOOD): IFOBT: NEGATIVE

## 2015-02-03 NOTE — Progress Notes (Signed)
Pt returned IFOBT test and it negative.

## 2015-02-09 ENCOUNTER — Encounter: Payer: Self-pay | Admitting: Gastroenterology

## 2015-02-10 ENCOUNTER — Other Ambulatory Visit: Payer: Self-pay

## 2015-02-10 ENCOUNTER — Telehealth: Payer: Self-pay | Admitting: Gastroenterology

## 2015-02-10 DIAGNOSIS — R197 Diarrhea, unspecified: Secondary | ICD-10-CM

## 2015-02-10 DIAGNOSIS — R195 Other fecal abnormalities: Secondary | ICD-10-CM

## 2015-02-10 NOTE — Telephone Encounter (Signed)
Lab orders done and faxed to the [facility] lab. This is an [facility] pt.

## 2015-02-10 NOTE — Telephone Encounter (Signed)
Please fax a CBC to [facility] lab, and a Cdiff PCR order. Patient is having "dark" stool. Diarrhea. Has recently completed antibiotics for diverticulitis.

## 2015-02-10 NOTE — Telephone Encounter (Signed)
Thanks

## 2015-02-11 ENCOUNTER — Other Ambulatory Visit (HOSPITAL_COMMUNITY)
Admission: RE | Admit: 2015-02-11 | Discharge: 2015-02-11 | Disposition: A | Discharge status: Home / Self Care | Payer: PPO | Source: Ambulatory Visit | Attending: Gastroenterology | Admitting: Gastroenterology

## 2015-02-11 DIAGNOSIS — R195 Other fecal abnormalities: Secondary | ICD-10-CM | POA: Insufficient documentation

## 2015-02-11 LAB — CBC WITH DIFFERENTIAL/PLATELET
Basophils Absolute: 0 10*3/uL (ref 0.0–0.1)
Basophils Relative: 1 %
EOS ABS: 0.2 10*3/uL (ref 0.0–0.7)
EOS PCT: 3 %
HCT: 38.6 % (ref 36.0–46.0)
Hemoglobin: 12.7 g/dL (ref 12.0–15.0)
LYMPHS ABS: 2.8 10*3/uL (ref 0.7–4.0)
Lymphocytes Relative: 35 %
MCH: 31.8 pg (ref 26.0–34.0)
MCHC: 32.9 g/dL (ref 30.0–36.0)
MCV: 96.5 fL (ref 78.0–100.0)
MONOS PCT: 6 %
Monocytes Absolute: 0.5 10*3/uL (ref 0.1–1.0)
Neutro Abs: 4.5 10*3/uL (ref 1.7–7.7)
Neutrophils Relative %: 55 %
PLATELETS: 316 10*3/uL (ref 150–400)
RBC: 4 MIL/uL (ref 3.87–5.11)
RDW: 12.6 % (ref 11.5–15.5)
WBC: 7.9 10*3/uL (ref 4.0–10.5)

## 2015-02-12 LAB — CLOSTRIDIUM DIFFICILE BY PCR: CDIFFPCR: NOT DETECTED

## 2015-02-12 NOTE — Progress Notes (Signed)
Quick Note:  CBC normal. Cdiff pending. ______

## 2015-02-16 ENCOUNTER — Encounter: Payer: Self-pay | Admitting: Gastroenterology

## 2015-03-08 ENCOUNTER — Encounter: Payer: Self-pay | Admitting: Gastroenterology

## 2015-03-08 ENCOUNTER — Ambulatory Visit (INDEPENDENT_AMBULATORY_CARE_PROVIDER_SITE_OTHER): Payer: PPO | Admitting: Gastroenterology

## 2015-03-08 VITALS — BP 130/72 | HR 77 | Temp 98.6°F | Ht 60.0 in | Wt 166.2 lb

## 2015-03-08 DIAGNOSIS — K5732 Diverticulitis of large intestine without perforation or abscess without bleeding: Secondary | ICD-10-CM | POA: Diagnosis not present

## 2015-03-08 NOTE — Progress Notes (Signed)
Referring Provider: [name], MD Primary Care Physician:  [name], MD  Primary GI: Dr. Oneida Alar   Chief Complaint  Patient presents with  . Follow-up    HPI:   Amy Houston is a 70 y.o. female presenting today with a history of breast cancer, s/p bilateral mastectomy May 2014. Genetic testing revealed elevated risk for pancreatic and colorectal cancer. She underwent colonoscopy 2014 with 2 small adenomas, due for surveillance 2019. EGD at that time with dilation due to proximal esophageal web and Schatzki's ring. She had repeat EGD with dilation again in July 2014. Recently found to have diverticulitis, confirmed by CT in late Jan 2017. Completed Cipro and Flagyl. Ifobt on file negative. CBC normal. Due to mild loose stool in setting of antibiotics, Cdiff PCR ordered and negative.   Doesn't feel as bloated. Diarrhea improved. When she has to go, still has some sharp pain. Relieved after defecation. [place] stool scale #4-6 currently. 2-3 bowel movements a day. Close to her baseline. No rectal bleeding.     Past Medical History  Diagnosis Date  . HTN (hypertension)   . GERD (gastroesophageal reflux disease)     tums  . IBS (irritable bowel syndrome)   . Hemorrhoid   . History of blood transfusion     post child birth  . PPD positive, treated 1999  . Sleep apnea     "gone after I had OR" (05/08/2012)  . Diabetes mellitus 1999    "dx; never on meds" (05/08/2012)  . H/O hiatal hernia   . Arthritis     "of the spine" (05/08/2012)  . Breast cancer ([place]) 1995    "left" (05/08/2012)  . Breast cancer, right breast, IDC, Multifocal 03/12/2012    Multifocal, 10 and 8 left breast   . Osteopenia 12/11/2012    On Ca++ and Vit D. Bone density in April 2014.  Next bone density due in April 2016.  Marland Kitchen PONV (postoperative nausea and vomiting)   . Exertional shortness of breath   . Abnormal genetic test 04/09/2013    PALB2 and CHEK2 positivity     Past Surgical History    Procedure Laterality Date  . Tubal ligation  1976  . Mastectomy complete / simple w/ sentinel node biopsy Right 05/08/2012  . Mastectomy complete / simple Left 05/08/2012  . Uvulopalatopharyngoplasty, tonsillectomy and septoplasty  ~ 2008  . Cholecystectomy  1992  . Dilation and curettage of uterus  1968  . Tonsillectomy and adenoidectomy  ~ 2008  . Breast lumpectomy Left 1995  . Breast biopsy Left 1995  . Breast biopsy Bilateral 03/2012    "one on the left; 2 on the right" (05/08/2012)  . Simple mastectomy with axillary sentinel node biopsy Right 05/08/2012    Procedure: RIGHT TOTAL MASTECTOMY WITH AXILLARY SENTINEL NODE BIOPSY;  Surgeon: Haywood Lasso, MD;  Location: [facility];  Service: General;  Laterality: Right;  . Simple mastectomy with axillary sentinel node biopsy Left 05/08/2012    Procedure: LEFT TOTAL MASTECTOMY;  Surgeon: Haywood Lasso, MD;  Location: [facility];  Service: General;  Laterality: Left;  . Colonoscopy  12/06/93    Dr. Hayes:single diminutive polyp of the descending colon/extrernal hemorrhoids, benign path  . Colonoscopy  11/30/98    Dr Hayes:small rectal polyp/internal hemorrhoids, benign path  . Colonoscopy  11/26/2006    Dr. Hayes:sigmoid polyp/small interal hemorrhoids, adenomatous  . Esophagogastroduodenoscopy  1994    Dr. Cristina Gong: small hiatal hernia and Schatzki's ring widely patent, CLO  test negative  . Colonoscopy with esophagogastroduodenoscopy (egd) N/A 07/01/2012    Dr. Oneida Alar: 2 small adenomas, sigmoid colon diverticula, surveillance in 2019  . Esophageal dilation  07/01/2012    Dr. Oneida Alar: proximal esophageal web, Schatzki's ring at GE junction, s/p dilaiton. Mild non-erosive gastritis. Repeat EGD July 23, 2012.   Marland Kitchen Esophagogastroduodenoscopy (egd) with esophageal dilation N/A 07/23/2012    Dr. Fields:proximal esophageal web v. radiation induced stricture/schatzki ring/small HH, s/p dilation  . Salpingoophorectomy Bilateral 01/30/2013    Procedure: SALPINGO  OOPHORECTOMY;  Surgeon: Melina Schools, MD;  Location: [facility];  Service: Gynecology;  Laterality: Bilateral;  . Abdominal hysterectomy Bilateral 01/30/2013    Procedure: HYSTERECTOMY ABDOMINAL;  Surgeon: Melina Schools, MD;  Location: [facility];  Service: Gynecology;  Laterality: Bilateral;    Current Outpatient Prescriptions  Medication Sig Dispense Refill  . Calcium Carbonate-Vitamin D (CALCIUM 600+D3 PO) Take 1 tablet by mouth 2 (two) times daily.    . cholecalciferol (VITAMIN D) 1000 UNITS tablet Take 2,000 Units by mouth 2 (two) times daily.     Marland Kitchen exemestane (AROMASIN) 25 MG tablet TAKE (1) TABLET ONCE DAILY IN THE MORNING, AFTER BREAKFAST. 30 tablet 5  . lisinopril (PRINIVIL,ZESTRIL) 20 MG tablet Take 20 mg by mouth daily.    . metoprolol succinate (TOPROL-XL) 25 MG 24 hr tablet Take 25 mg by mouth daily.    . Multiple Vitamin (MULTIVITAMIN WITH MINERALS) TABS Take 1 tablet by mouth daily.    Marland Kitchen omeprazole (PRILOSEC) 20 MG capsule TAKE ONE CAPSULE BY MOUTH ONCE DAILY. 30 capsule 5  . oxybutynin (DITROPAN-XL) 5 MG 24 hr tablet Take 5 mg by mouth at bedtime.  1  . venlafaxine (EFFEXOR) 50 MG tablet Take 50 mg by mouth 2 (two) times daily.     No current facility-administered medications for this visit.    Allergies as of 03/08/2015 - Review Complete 03/08/2015  Allergen Reaction Noted  . Penicillins Rash 08/18/2010    Family History  Problem Relation Age of Onset  . Lung cancer Mother 42    smoker  . Leukemia Father 18    Hairy Cell at 18, CLL at 28  . Lymphoma Father 69    Hodgkins lymphoma  . Lung cancer Sister 17  . Thyroid cancer Brother 46    Medullary  . Lymphoma Brother 60    Follicular lymphoma  . Breast cancer Maternal Aunt     mother's paternal half sister  . Lung cancer Maternal Uncle     mother's paternal half brother  . Lung cancer Maternal Grandmother     smoker  . Cancer Paternal Uncle     oral cancer dx in his 66s  . Other Daughter     leiomyoma of  esophagus  . Colon cancer Maternal Aunt     mother's maternal half sister  . Lung cancer Cousin     paternal cousin  . Breast cancer Cousin     paternal cousin died in her 13s    Social History   Social History  . Marital Status: Married    Spouse Name: N/A  . Number of Children: 2  . Years of Education: 12th grade   Occupational History  . 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 Smoking status: Never Smoker   . Smokeless tobacco: Never Used  . Alcohol Use: Yes     Comment: 05/08/2012 "margarita or glass of wine once or twice/yr"  . Drug Use:  No  . Sexual Activity: No   Other Topics Concern  . None   Social History Narrative    Review of Systems: Negative unless mentioned in HPI.   Physical Exam: BP 130/72 mmHg  Pulse 77  Temp(Src) 98.6 F (37 C) (Oral)  Ht 5' (1.524 m)  Wt 166 lb 3.2 oz (75.388 kg)  BMI 32.46 kg/m2 General:   Alert and oriented. No distress noted. Pleasant and cooperative.  Head:  Normocephalic and atraumatic. Eyes:  Conjuctiva clear without scleral icterus. Mouth:  Oral mucosa pink and moist. Good dentition. No lesions. Abdomen:  +BS, soft, non-tender and non-distended. No rebound or guarding. No HSM or masses noted. Msk:  Symmetrical without gross deformities. Normal posture. Extremities:  Without edema. Neurologic:  Alert and  oriented x4;  grossly normal neurologically. Psych:  Alert and cooperative. Normal mood and affect.  Lab Results  Component Value Date   WBC 7.9 02/11/2015   HGB 12.7 02/11/2015   HCT 38.6 02/11/2015   MCV 96.5 02/11/2015   PLT 316 02/11/2015

## 2015-03-08 NOTE — Patient Instructions (Signed)
I am so sorry, we are out of probiotic samples!  You can use [facility], [facility], [facility], [place], or [facility].  I will see you in 6 months!

## 2015-03-08 NOTE — Assessment & Plan Note (Signed)
Recent episode of uncomplicated diverticulitis, doing well now. Bowel habits near baseline. Will have her trial a probiotic. Ifobt negative and CBC normal. Return in 6 months. Recheck ifobt at that time. Genetic testing revealed higher risk for pancreatic and colon cancer, so we will remain vigilant with any changes in her that would prompt an early interval colonoscopy. Otherwise, next surveillance in 2019.

## 2015-03-09 NOTE — Progress Notes (Signed)
cc'ed to pcp °

## 2015-04-20 ENCOUNTER — Ambulatory Visit (INDEPENDENT_AMBULATORY_CARE_PROVIDER_SITE_OTHER): Payer: PPO | Admitting: Urology

## 2015-04-20 DIAGNOSIS — N393 Stress incontinence (female) (male): Secondary | ICD-10-CM

## 2015-04-20 DIAGNOSIS — N368 Other specified disorders of urethra: Secondary | ICD-10-CM | POA: Diagnosis not present

## 2015-04-21 ENCOUNTER — Encounter: Payer: Self-pay | Admitting: Gastroenterology

## 2015-05-11 ENCOUNTER — Telehealth: Payer: Self-pay | Admitting: Gastroenterology

## 2015-05-11 NOTE — Telephone Encounter (Signed)
CALLED PATIENT REGARDING VIBERZI LETTER AND SHE STATED THAT THERE IS NO NEED FOR HER TO HAVE AN APPOINTMENT BECAUSE SHE NEVER TOOK THE MEDICATION.

## 2015-05-12 NOTE — Telephone Encounter (Signed)
Noted  

## 2015-05-24 ENCOUNTER — Other Ambulatory Visit: Payer: Self-pay | Admitting: Gastroenterology

## 2015-06-14 ENCOUNTER — Ambulatory Visit (HOSPITAL_COMMUNITY)
Admission: RE | Admit: 2015-06-14 | Discharge: 2015-06-14 | Disposition: A | Discharge status: Home / Self Care | Payer: PPO | Source: Ambulatory Visit | Attending: Hematology & Oncology | Admitting: Hematology & Oncology

## 2015-06-14 DIAGNOSIS — Z853 Personal history of malignant neoplasm of breast: Secondary | ICD-10-CM | POA: Diagnosis not present

## 2015-06-14 DIAGNOSIS — R898 Other abnormal findings in specimens from other organs, systems and tissues: Secondary | ICD-10-CM | POA: Diagnosis not present

## 2015-06-14 DIAGNOSIS — Z9013 Acquired absence of bilateral breasts and nipples: Secondary | ICD-10-CM | POA: Insufficient documentation

## 2015-06-14 LAB — POCT I-STAT CREATININE: Creatinine, Ser: 0.9 mg/dL (ref 0.44–1.00)

## 2015-06-14 MED ORDER — GADOBENATE DIMEGLUMINE 529 MG/ML IV SOLN
15.0000 mL | Freq: Once | INTRAVENOUS | Status: AC | PRN
  Administered 2015-06-14: 15 mL via INTRAVENOUS

## 2015-06-16 ENCOUNTER — Encounter (HOSPITAL_COMMUNITY): Payer: PPO | Attending: Hematology & Oncology

## 2015-06-16 DIAGNOSIS — Z853 Personal history of malignant neoplasm of breast: Secondary | ICD-10-CM

## 2015-06-16 LAB — CBC WITH DIFFERENTIAL/PLATELET
BASOS ABS: 0 10*3/uL (ref 0.0–0.1)
BASOS PCT: 0 %
EOS ABS: 0 10*3/uL (ref 0.0–0.7)
Eosinophils Relative: 0 %
HEMATOCRIT: 36.5 % (ref 36.0–46.0)
HEMOGLOBIN: 12.2 g/dL (ref 12.0–15.0)
Lymphocytes Relative: 34 %
Lymphs Abs: 2.7 10*3/uL (ref 0.7–4.0)
MCH: 31.6 pg (ref 26.0–34.0)
MCHC: 33.4 g/dL (ref 30.0–36.0)
MCV: 94.6 fL (ref 78.0–100.0)
MONOS PCT: 6 %
Monocytes Absolute: 0.4 10*3/uL (ref 0.1–1.0)
NEUTROS ABS: 4.8 10*3/uL (ref 1.7–7.7)
NEUTROS PCT: 60 %
Platelets: 311 10*3/uL (ref 150–400)
RBC: 3.86 MIL/uL — AB (ref 3.87–5.11)
RDW: 12.7 % (ref 11.5–15.5)
WBC: 8 10*3/uL (ref 4.0–10.5)

## 2015-06-16 LAB — COMPREHENSIVE METABOLIC PANEL
ALBUMIN: 4.5 g/dL (ref 3.5–5.0)
ALK PHOS: 35 U/L — AB (ref 38–126)
ALT: 32 U/L (ref 14–54)
ANION GAP: 6 (ref 5–15)
AST: 26 U/L (ref 15–41)
BILIRUBIN TOTAL: 0.6 mg/dL (ref 0.3–1.2)
BUN: 21 mg/dL — AB (ref 6–20)
CALCIUM: 9.4 mg/dL (ref 8.9–10.3)
CO2: 26 mmol/L (ref 22–32)
CREATININE: 0.98 mg/dL (ref 0.44–1.00)
Chloride: 106 mmol/L (ref 101–111)
GFR calc Af Amer: >60 mL/min (ref >60)
GFR calc non Af Amer: 58 mL/min — ABNORMAL LOW (ref >60)
GLUCOSE: 129 mg/dL — AB (ref 65–99)
Potassium: 3.8 mmol/L (ref 3.5–5.1)
SODIUM: 138 mmol/L (ref 135–145)
TOTAL PROTEIN: 7.9 g/dL (ref 6.5–8.1)

## 2015-06-17 LAB — CANCER ANTIGEN 27.29: CA 27.29: 13.6 U/mL (ref 0.0–38.6)

## 2015-06-18 ENCOUNTER — Encounter (HOSPITAL_BASED_OUTPATIENT_CLINIC_OR_DEPARTMENT_OTHER): Payer: PPO | Admitting: Hematology & Oncology

## 2015-06-18 ENCOUNTER — Encounter (HOSPITAL_COMMUNITY): Payer: PPO

## 2015-06-18 ENCOUNTER — Other Ambulatory Visit (HOSPITAL_COMMUNITY): Payer: PPO

## 2015-06-18 VITALS — BP 154/64 | HR 77 | Temp 98.2°F | Resp 18 | Wt 168.4 lb

## 2015-06-18 DIAGNOSIS — C50911 Malignant neoplasm of unspecified site of right female breast: Secondary | ICD-10-CM

## 2015-06-18 DIAGNOSIS — Z79811 Long term (current) use of aromatase inhibitors: Secondary | ICD-10-CM

## 2015-06-18 DIAGNOSIS — E559 Vitamin D deficiency, unspecified: Secondary | ICD-10-CM | POA: Diagnosis not present

## 2015-06-18 DIAGNOSIS — M8580 Other specified disorders of bone density and structure, unspecified site: Secondary | ICD-10-CM | POA: Diagnosis not present

## 2015-06-18 DIAGNOSIS — Z853 Personal history of malignant neoplasm of breast: Secondary | ICD-10-CM

## 2015-06-18 LAB — CANCER ANTIGEN 15-3: Cancer Antigen-Breast 15-3: 4 U/mL (ref <32)

## 2015-06-18 MED ORDER — DENOSUMAB 60 MG/ML ~~LOC~~ SOLN: 60.0000 mg | Freq: Once | SUBCUTANEOUS | Status: DC

## 2015-06-18 MED ORDER — DENOSUMAB 60 MG/ML ~~LOC~~ SOLN
60.0000 mg | Freq: Once | SUBCUTANEOUS | Status: AC
  Administered 2015-06-18: 60 mg via SUBCUTANEOUS
  Filled 2015-06-18: qty 1

## 2015-06-18 NOTE — Progress Notes (Signed)
Amy Bogus, MD Amy [place] [id number]  Osteopenia - Plan: SCHEDULING COMMUNICATION, denosumab (PROLIA) injection 60 mg, SCHEDULING COMMUNICATION, DISCONTINUED: denosumab (PROLIA) injection 60 mg  CURRENT THERAPY: Aromasin daily beginning on 06/03/2012 and she will take this lifelong due to increased recurrence rate with PALB2 and CHEK2 positivity. Osteopenia, calcium plus vitamin D. Prolia therapy  INTERVAL HISTORY: Amy Houston 70 y.o. female returns for  regular  visit for followup of right sided breast cancer,GRADE II, 1.2 cm invasive ductal carcinoma, ER +99%, PR +72%, grade 2, LV I positive, Ki-67 marker high at 60%, but 4 sentinel lymph nodes negative for metastatic disease. HER-2/neu nonamplified. S/P Bilateral mastectomy on 05/08/2012 by Dr. Osborn Coho. Her Oncotype DX score was 14 giving her recurrence rate of 9% at 5 years of hormonal therapy. With that in mind she was not considered a candidate for sytemic chemotherapy. Now on Aromasin beginning on 06/03/2012 and she will take this lifelong due to her PALB2 And CHEK 2 positivity. Additionally, she is S/P bilateral salpingo-oophorectomy on 01/30/2013. 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 followed by radiation therapy. She was not given tamoxifen. There was some question of Megace one tablet a day but she is not sure of the duration. This may have been for hot flashes which she remembers were severe. She was eventually placed on Effexor for hot flashes but is now on the Effexor for depression.   Amy Houston is unaccompanied. I personally reviewed and went over laboratory and imaging studies with the patient.  She recently had diverticulitis. She received good results from her colonoscopy.  She has no major complaints or concerns at  this time. She has a good appetite, "I have been eating nearly everything in sight". Her PCP is Dr. Luan Pulling. She does not follow with anyone concerning her murmur. She has received an echocardiogram in the past. She denies any breathing issues.    Reports she has been retaining fluid a little more than normal in her left arm. She has been massaging her left arm. She has a lymphedema pump at home but does not use it. She wasn't sure if she needed a fluid pill or something. "I just feel bloated".   The patient notes her BP was elevated today. Her BP usually runs about 120. She plans on buying a BP cuff to monitor it at home.  She does not perform thorough self breast exams.  Her niece passed in March.      Breast cancer, right breast, IDC, Multifocal   03/19/2012 Initial Diagnosis Breast cancer, right breast, IDC, Multifocal   05/08/2012 Surgery Bilateral simple mastectomies by Dr. Margot Chimes   06/03/2012 -  Chemotherapy Aromatase inhibitor.  PALB2 and CHEK2+ and therefore would benefit from lifelong AI therapy.   01/30/2013 Surgery Laparotomy, minimal lysis of adhesions, abdominal hysterectomy, bilateral salpingo-oophorectomy.    Oncologically, the patient denies any complaints and ROS questioning is negative.  Past Medical History  Diagnosis Date  . HTN (hypertension)   . GERD (gastroesophageal reflux disease)     tums  . IBS (irritable bowel syndrome)   . Hemorrhoid   . History of blood transfusion     post child birth  . PPD positive, treated 1999  . Sleep apnea     "gone after I had OR" (05/08/2012)  .  Diabetes mellitus 1999    "dx; never on meds" (05/08/2012)  . H/O hiatal hernia   . Arthritis     "of the spine" (05/08/2012)  . Breast cancer ([place]) 1995    "left" (05/08/2012)  . Breast cancer, right breast, IDC, Multifocal 03/12/2012    Multifocal, 10 and 8 left breast   . Osteopenia 12/11/2012    On Ca++ and Vit D. Bone density in April 2014.  Next bone density due in April 2016.  Marland Kitchen PONV  (postoperative nausea and vomiting)   . Exertional shortness of breath   . Abnormal genetic test 04/09/2013    PALB2 and CHEK2 positivity     has Popliteal cyst; Breast cancer, right breast, IDC, Multifocal; Hx Left breast cancer, UOQ, IDC, receptor negative; GERD (gastroesophageal reflux disease); Adenomatous polyps; Thyroid nodule; Osteopenia; Abnormal genetic test; Acquired trigger finger; Genetic testing; LLQ abdominal pain; and Diverticulitis of colon on her problem list.     is allergic to penicillins.  Amy Houston does not currently have medications on file.  Past Surgical History  Procedure Laterality Date  . Tubal ligation  1976  . Mastectomy complete / simple w/ sentinel node biopsy Right 05/08/2012  . Mastectomy complete / simple Left 05/08/2012  . Uvulopalatopharyngoplasty, tonsillectomy and septoplasty  ~ 2008  . Cholecystectomy  1992  . Dilation and curettage of uterus  1968  . Tonsillectomy and adenoidectomy  ~ 2008  . Breast lumpectomy Left 1995  . Breast biopsy Left 1995  . Breast biopsy Bilateral 03/2012    "one on the left; 2 on the right" (05/08/2012)  . Simple mastectomy with axillary sentinel node biopsy Right 05/08/2012    Procedure: RIGHT TOTAL MASTECTOMY WITH AXILLARY SENTINEL NODE BIOPSY;  Surgeon: Haywood Lasso, MD;  Location: [facility];  Service: General;  Laterality: Right;  . Simple mastectomy with axillary sentinel node biopsy Left 05/08/2012    Procedure: LEFT TOTAL MASTECTOMY;  Surgeon: Haywood Lasso, MD;  Location: [facility];  Service: General;  Laterality: Left;  . Colonoscopy  12/06/93    Dr. Hayes:single diminutive polyp of the descending colon/extrernal hemorrhoids, benign path  . Colonoscopy  11/30/98    Dr Hayes:small rectal polyp/internal hemorrhoids, benign path  . Colonoscopy  11/26/2006    Dr. Hayes:sigmoid polyp/small interal hemorrhoids, adenomatous  . Esophagogastroduodenoscopy  1994    Dr. Cristina Gong: small hiatal hernia and Schatzki's ring widely  patent, CLO test negative  . Colonoscopy with esophagogastroduodenoscopy (egd) N/A 07/01/2012    Dr. Oneida Alar: 2 small adenomas, sigmoid colon diverticula, surveillance in 2019  . Esophageal dilation  07/01/2012    Dr. Oneida Alar: proximal esophageal web, Schatzki's ring at GE junction, s/p dilaiton. Mild non-erosive gastritis. Repeat EGD July 23, 2012.   Marland Kitchen Esophagogastroduodenoscopy (egd) with esophageal dilation N/A 07/23/2012    Dr. Fields:proximal esophageal web v. radiation induced stricture/schatzki ring/small HH, s/p dilation  . Salpingoophorectomy Bilateral 01/30/2013    Procedure: SALPINGO OOPHORECTOMY;  Surgeon: Melina Schools, MD;  Location: [facility];  Service: Gynecology;  Laterality: Bilateral;  . Abdominal hysterectomy Bilateral 01/30/2013    Procedure: HYSTERECTOMY ABDOMINAL;  Surgeon: Melina Schools, MD;  Location: [facility];  Service: Gynecology;  Laterality: Bilateral;   FAMILY HISTORY UPDATE: 2 daughters, both received her cancer gene, youngest daughter diagnosed with breast cancer , oldest regularly seen at [facility] for breast cancer Sister received one of her 2 abnormality genes however, she is completely normal   ROS Denies any headaches, dizziness, double vision, fevers, chills,  night sweats, nausea, vomiting, diarrhea, constipation, chest pain, heart palpitations, shortness of breath, blood in stool, black tarry stool, urinary pain, urinary burning, urinary frequency, hematuria. Positive for swelling. Left arm swelling. 14 point review of systems was performed and is negative except as detailed under history of present illness and above  PHYSICAL EXAMINATION  ECOG PERFORMANCE STATUS: 0 - Asymptomatic  Filed Vitals:   06/18/15 1318  BP: 154/64  Pulse: 77  Temp: 98.2 F (36.8 C)  Resp: 18    GENERAL:alert, no distress, well nourished, well developed, comfortable, cooperative and smiling. Obese. SKIN: skin color, texture, turgor are normal, no rashes or significant  lesions HEAD: Normocephalic, No masses, lesions, tenderness or abnormalities EYES: normal, PERRLA, EOMI, Conjunctiva are pink and non-injected EARS: External ears normal OROPHARYNX:mucous membranes are moist  NECK: supple, no adenopathy, thyroid normal size, non-tender, without nodularity, no stridor, non-tender, trachea midline LYMPH:  no palpable lymphadenopathy in the neck, axillae or supraclavicular regions. BREAST:B/L mastectomy sites well healed and free of suspicious changes. More scar tissue on the left chest wall than the right. Prior radiation changes to the left chest wall. No palpable nodularity, no suspicious skin changes LUNGS: clear to auscultation and percussion HEART: regular rate & rhythm, no murmurs, no gallops, S1 normal and S2 normal Subtle systolic ejection murmur ABDOMEN:abdomen soft, non-tender, obese, normal bowel sounds, no masses or organomegaly and no hepatosplenomegaly BACK: Back symmetric, no curvature., No CVA tenderness EXTREMITIES:less then 2 second capillary refill, no joint deformities, effusion, or inflammation, no skin discoloration, no clubbing, no cyanosis  Lymphedema present in the Left arm, nearly twice the size of her Right arm. NEURO: alert & oriented x 3 with fluent speech, no focal motor/sensory deficits, gait normal   LABORATORY DATA: I have reviewed the data as listed.  CBC    Component Value Date/Time   WBC 8.0 06/16/2015 1530   RBC 3.86* 06/16/2015 1530   HGB 12.2 06/16/2015 1530   HCT 36.5 06/16/2015 1530   PLT 311 06/16/2015 1530   MCV 94.6 06/16/2015 1530   MCH 31.6 06/16/2015 1530   MCHC 33.4 06/16/2015 1530   RDW 12.7 06/16/2015 1530   LYMPHSABS 2.7 06/16/2015 1530   MONOABS 0.4 06/16/2015 1530   EOSABS 0.0 06/16/2015 1530   BASOSABS 0.0 06/16/2015 1530      Chemistry      Component Value Date/Time   NA 138 06/16/2015 1530   K 3.8 06/16/2015 1530   CL 106 06/16/2015 1530   CO2 26 06/16/2015 1530   BUN 21* 06/16/2015  1530   CREATININE 0.98 06/16/2015 1530      Component Value Date/Time   CALCIUM 9.4 06/16/2015 1530   ALKPHOS 35* 06/16/2015 1530   AST 26 06/16/2015 1530   ALT 32 06/16/2015 1530   BILITOT 0.6 06/16/2015 1530      RADIOGRAPHIC STUDIES: I have reviewed the above documentation for accuracy and completeness, and I agree with the above.  Study Result     CLINICAL DATA: 70 year old female with history of bilateral breast cancers and bilateral mastectomies for high risk screening breast MRI. The patient has PALPB2 and CHEK2 genetic mutations.  LABS: No labs were performed at the imaging center today.  EXAM: BILATERAL BREAST MRI WITH AND WITHOUT CONTRAST  TECHNIQUE: Multiplanar, multisequence MR images of both breasts were obtained prior to and following the intravenous administration of 15 ml of MultiHance.  THREE-DIMENSIONAL MR IMAGE RENDERING ON INDEPENDENT WORKSTATION:  Three-dimensional MR images were rendered by post-processing of the  original MR data on an independent workstation. The three-dimensional MR images were interpreted, and findings are reported in the following complete MRI report for this study. Three dimensional images were evaluated at the independent DynaCad workstation  COMPARISON: Previous exam(s).  FINDINGS: Breast composition: a. Almost entirely fat.  Background parenchymal enhancement: Minimal  Right breast: The patient has had a right mastectomy. No suspicious mass or enhancement.  Left breast: The patient has had a left mastectomy. No suspicious mass or enhancement.  Lymph nodes: No abnormal appearing lymph nodes.  Ancillary findings: None.  IMPRESSION: No MRI evidence of malignancy. The patient is status post bilateral mastectomies.  RECOMMENDATION: Clinical management/ evaluation as indicated.  BI-RADS CATEGORY 2: Benign.   Electronically Signed  By: Pamelia Hoit M.D.  On: 06/14/2015 16:32        ASSESSMENT:  1. Right sided breast cancer,GRADE II, 1.2 cm invasive ductal carcinoma, ER +99%, PR +72%, grade 2, LV I positive, Ki-67 marker high at 60%, but 4 sentinel lymph nodes negative for metastatic disease. HER-2/neu nonamplified. S/P Bilateral mastectomy on 05/08/2012 by Dr. Osborn Coho. Her Oncotype DX score was 14 giving her recurrence rate of 9% at 5 years of hormonal therapy. With that in mind she was not considered a candidate for sytemic chemotherapy. Now on Aromasin beginning on 06/03/2012 and she will take this lifelong due to her PALB2 And CHEK 2 positivity. S/P bilateral salpingo-oophorectomy on 01/30/2013. 2. PALB2 + mutation  3. CHEK2 + mutation  4. left-sided breast cancer 1996, treated with lumpectomy followed by chemotherapy consisting of CMF for 8 cycles. followed by radiation therapy. She was not given tamoxifen. There was some question of Megace one tablet a day but she is not sure of the duration. This may have been for hot flashes which she remembers were severe. She was eventually placed on Effexor for hot flashes but is now on the Effexor for depression.  5. Vitamin D deficiency on replacement  6. Osteopenia, next bone density due in April 2018. On Ca++ and Vit D. Prolia therapy  7. Pulmonary Nodules stable on imaging for greater than 2 years therefore presumed benign based on ongoing stability 8. Lymphedema  Patient Active Problem List   Diagnosis Date Noted  . Diverticulitis of colon 03/08/2015  . LLQ abdominal pain 01/22/2015  . Genetic testing 09/10/2014  . Acquired trigger finger 05/06/2013  . Abnormal genetic test 04/09/2013  . Osteopenia 12/11/2012  . Thyroid nodule 06/21/2012  . GERD (gastroesophageal reflux disease) 06/03/2012  . Adenomatous polyps 06/03/2012  . Breast cancer, right breast, IDC, Multifocal 03/12/2012  . Popliteal cyst 08/18/2010  . Hx Left breast cancer, UOQ, IDC, receptor negative 11/08/1993    PLAN:  1. I personally reviewed and  went over laboratory results with the patient.  The results are noted within this dictation.  I reviewed her MRI in detail as well.  2. Chart reviewed 3. Bone density is due in April 2018. 4. Continue Aromasin daily.  Compliance encouraged.  She will require this lifelong due to increased risk of recurrence with PALB2 and CHEK2 mutations  5. Prolia for osteopenia and ongoing AI therapy 6. Return in 6 months for follow-up. 7. Continued to encourage ongoing weight loss. Also encouraged increasing physical activity 8. I discussed the importance of treating her lymphedema. I discussed the long term risks of untreated lymphedema to her extremity including skin infections (cellulitis, erysipelas, and lymphangitis), lymphangiosarcoma, and reduced quality of life including aspects of emotional, functional, physical, and social well-being. She  has been referred to lymphedema therapy.   I have re-ordered her chest MRI for June of next year of which may or may not be approved. She understands that with bilateral mastectomies no additional imaging is realistically required.  RTC 6 months.   I personally reviewed and went over laboratory and imaging studies with the patient.  Breast exam performed today.  THERAPY PLAN:  Encouraged compliance with Aromasin which we recommend at this time she continue lifelong due to [place] and CHEK2 positivity. We will follow NCCN guidelines pertaining to surveillance.   NCCN guidelines recommends the following surveillance for invasive breast cancer:  A. History and Physical exam every 4-6 months for 5 years and then every 12 months.  B. Mammography every 12 months  C. Women on Tamoxifen: annual gynecologic assessment every 12 months if uterus is present.  D. Women on aromatase inhibitor or who experience ovarian failure secondary to treatment should have monitoring of bone health with a bone mineral density determination at baseline and periodically thereafter.  E.  Assess and encourage adherence to adjuvant endocrine therapy.  F. Evidence suggests that active lifestyle and achieving and maintaining an ideal body weight (20-25 BMI) may lead to optimal breast cancer outcomes.  All questions were answered. The patient knows to call the clinic with any problems, questions or concerns. We can certainly see the patient much sooner if necessary.  This document serves as a record of services personally performed by Ancil Linsey, MD. It was created on her behalf by Arlyce Harman, a trained medical scribe. The creation of this record is based on the scribe's personal observations and the provider's statements to them. This document has been checked and approved by the attending provider.  I have reviewed the above documentation for accuracy and completeness, and I agree with the above.  Kelby Fam. Whitney Muse, MD

## 2015-06-18 NOTE — Progress Notes (Signed)
Please see doctors encounter for more information 

## 2015-06-18 NOTE — Patient Instructions (Addendum)
[place] at [facility] Discharge Instructions  RECOMMENDATIONS MADE BY THE CONSULTANT AND ANY TEST RESULTS WILL BE SENT TO YOUR REFERRING PHYSICIAN.  Exam done and seen today by Dr. Whitney Muse  Return to see the doctor in 6 months with labs  Please call the clinic if you have any questions or concerns  Refer to lymphedema therapy LUE lymphedema  Thank you for choosing [place] [facility] to provide your oncology and hematology care.  To afford each patient quality time with our provider, please arrive at least 15 minutes before your scheduled appointment time.   Beginning January 23rd 2017 lab work for the [facility] will be done in the  Main lab at [facility] on 1st floor. If you have a lab appointment with the [facility] please come in thru the  Main Entrance and check in at the main information desk  You need to re-schedule your appointment should you arrive 10 or more minutes late.  We strive to give you quality time with our providers, and arriving late affects you and other patients whose appointments are after yours.  Also, if you no show three or more times for appointments you may be dismissed from the clinic at the providers discretion.     Again, thank you for choosing [facility].  Our hope is that these requests will decrease the amount of time that you wait before being seen by our physicians.       _____________________________________________________________  Should you have questions after your visit to [facility], please contact our office at [phone] between the hours of 8:30 a.m. and 4:30 p.m.  Voicemails left after 4:30 p.m. will not be returned until the following business day.  For prescription refill requests, have your pharmacy contact our office.         Resources For Cancer Patients and their Caregivers ? American Cancer Society: Can assist with transportation, wigs,  general needs, runs Look Good Feel Better.        816-032-2716 ? Cancer Care: Provides financial assistance, online support groups, medication/co-pay assistance.  1-800-813-HOPE 8321614344) ? [facility] Assists [place] cancer patients and their families through emotional , educational and financial support.  (626) 321-2199 ? [place] DSS Where to apply for food stamps, Medicaid and utility assistance. 9518246730 ? [facility]: Transportation to medical appointments. 936-102-8449 ? Social Security Administration: May apply for disability if have a Stage IV cancer. 636-427-0542 3230180461 ? [employer], Disability and Transit Services: Assists with nutrition, care and transit needs. [place] Support Programs: '@10RELATIVEDAYS'$ @ > Cancer Support Group  2nd Tuesday of the month 1pm-2pm, Journey Room  > Creative Journey  3rd Tuesday of the month 1130am-1pm, Journey Room  > Look Good Feel Better  1st Wednesday of the month 10am-12 noon, Journey Room (Call [place] to register [phone])

## 2015-06-30 ENCOUNTER — Other Ambulatory Visit (HOSPITAL_COMMUNITY): Payer: Self-pay | Admitting: Oncology

## 2015-07-17 ENCOUNTER — Encounter (HOSPITAL_COMMUNITY): Payer: Self-pay | Admitting: Hematology & Oncology

## 2015-07-20 ENCOUNTER — Ambulatory Visit (HOSPITAL_COMMUNITY): Payer: PPO | Attending: Hematology & Oncology | Admitting: Physical Therapy

## 2015-07-20 DIAGNOSIS — I972 Postmastectomy lymphedema syndrome: Secondary | ICD-10-CM | POA: Diagnosis not present

## 2015-07-20 NOTE — Therapy (Signed)
[place], [place], [address] Phone: [phone]   Fax:  [phone]  Physical Therapy Evaluation  Patient Details  Name: Amy Houston MRN: [id number] Date of Birth: 07-30-1945 Referring Provider: Ancil Linsey  Encounter Date: 07/20/2015      PT End of Session - 07/20/15 1603    Visit Number 1   Number of Visits 18   Date for PT Re-Evaluation 08/19/15   Authorization Type [employer]   Authorization - Visit Number 1   Authorization - Number of Visits 10   PT Start Time 1435   PT Stop Time 1517   PT Time Calculation (min) 42 min   Activity Tolerance Patient tolerated treatment well   Behavior During Therapy [facility] for tasks assessed/performed      Past Medical History  Diagnosis Date  . HTN (hypertension)   . GERD (gastroesophageal reflux disease)     tums  . IBS (irritable bowel syndrome)   . Hemorrhoid   . History of blood transfusion     post child birth  . PPD positive, treated 1999  . Sleep apnea     "gone after I had OR" (05/08/2012)  . Diabetes mellitus 1999    "dx; never on meds" (05/08/2012)  . H/O hiatal hernia   . Arthritis     "of the spine" (05/08/2012)  . Breast cancer ([place]) 1995    "left" (05/08/2012)  . Breast cancer, right breast, IDC, Multifocal 03/12/2012    Multifocal, 10 and 8 left breast   . Osteopenia 12/11/2012    On Ca++ and Vit D. Bone density in April 2014.  Next bone density due in April 2016.  Marland Kitchen PONV (postoperative nausea and vomiting)   . Exertional shortness of breath   . Abnormal genetic test 04/09/2013    PALB2 and CHEK2 positivity     Past Surgical History  Procedure Laterality Date  . Tubal ligation  1976  . Mastectomy complete / simple w/ sentinel node biopsy Right 05/08/2012  . Mastectomy complete / simple Left 05/08/2012  . Uvulopalatopharyngoplasty, tonsillectomy and septoplasty  ~ 2008  . Cholecystectomy  1992  . Dilation and curettage of uterus   1968  . Tonsillectomy and adenoidectomy  ~ 2008  . Breast lumpectomy Left 1995  . Breast biopsy Left 1995  . Breast biopsy Bilateral 03/2012    "one on the left; 2 on the right" (05/08/2012)  . Simple mastectomy with axillary sentinel node biopsy Right 05/08/2012    Procedure: RIGHT TOTAL MASTECTOMY WITH AXILLARY SENTINEL NODE BIOPSY;  Surgeon: Haywood Lasso, MD;  Location: [facility];  Service: General;  Laterality: Right;  . Simple mastectomy with axillary sentinel node biopsy Left 05/08/2012    Procedure: LEFT TOTAL MASTECTOMY;  Surgeon: Haywood Lasso, MD;  Location: [facility];  Service: General;  Laterality: Left;  . Colonoscopy  12/06/93    Dr. Hayes:single diminutive polyp of the descending colon/extrernal hemorrhoids, benign path  . Colonoscopy  11/30/98    Dr Hayes:small rectal polyp/internal hemorrhoids, benign path  . Colonoscopy  11/26/2006    Dr. Hayes:sigmoid polyp/small interal hemorrhoids, adenomatous  . Esophagogastroduodenoscopy  1994    Dr. Cristina Gong: small hiatal hernia and Schatzki's ring widely patent, CLO test negative  . Colonoscopy with esophagogastroduodenoscopy (egd) N/A 07/01/2012    Dr. Oneida Alar: 2 small adenomas, sigmoid colon diverticula, surveillance in 2019  . Esophageal dilation  07/01/2012    Dr. Oneida Alar: proximal esophageal web, Schatzki's ring at  GE junction, s/p dilaiton. Mild non-erosive gastritis. Repeat EGD July 23, 2012.   Marland Kitchen Esophagogastroduodenoscopy (egd) with esophageal dilation N/A 07/23/2012    Dr. Fields:proximal esophageal web v. radiation induced stricture/schatzki ring/small HH, s/p dilation  . Salpingoophorectomy Bilateral 01/30/2013    Procedure: SALPINGO OOPHORECTOMY;  Surgeon: Melina Schools, MD;  Location: [facility];  Service: Gynecology;  Laterality: Bilateral;  . Abdominal hysterectomy Bilateral 01/30/2013    Procedure: HYSTERECTOMY ABDOMINAL;  Surgeon: Melina Schools, MD;  Location: [facility];  Service: Gynecology;  Laterality: Bilateral;    There  were no vitals filed for this visit.       Subjective Assessment - 07/20/15 1353    Subjective [name] states that she has been diagnosed with lymphedema in 1996.  She has  noticed increased swelling in her Lt arm and hand for the past several months.  She has recieved an order for a compression garment and glove from her MD.  The therapist told her not to use this at this time.     Pertinent History Rt breast cancer with 4 sentinel node removal (-); diagnosed with Lt preast cancer in 1996 with chemo and radiation;Bilateral masectomy 05/08/2012 Bilateral salpingo-oophorectomy.   Currently in Pain? No/denies            [facility] PT Assessment - 07/20/15 0001    Assessment   Medical Diagnosis lymphedema   Referring Provider Larene Beach [place]   Onset Date/Surgical Date 05/20/15   Hand Dominance Right   Next MD Visit not scheduled   Prior Therapy yes    Precautions   Precautions None   Restrictions   Weight Bearing Restrictions Yes   Balance Screen   Has the patient fallen in the past 6 months No   Has the patient had a decrease in activity level because of a fear of falling?  No   Is the patient reluctant to leave their home because of a fear of falling?  No   Prior Function   Level of Independence Independent   Vocation Full time employment   Actor   Overall Cognitive Status Within Functional Limits for tasks assessed           LYMPHEDEMA/ONCOLOGY QUESTIONNAIRE - 07/20/15 1411    Type   Cancer Type B breast cancer    Surgeries   Mastectomy Date 05/08/12   Axillary Lymph Node Dissection Date 04/16/93   Other Surgery Date 05/08/12   Number Lymph Nodes Removed 7   Date Lymphedema/Swelling Started   Date 03/17/94   Treatment   Past Chemotherapy Treatment Yes   Past Radiation Treatment Yes   What other symptoms do you have   Are you Having Heaviness or Tightness Yes   Are you having Pain No   Are you having pitting edema No    Is it Hard or Difficult finding clothes that fit Yes   Do you have infections No   Is there Decreased scar mobility No   Stemmer Sign No   Lymphedema Stage   Stage STAGE 2 SPONTANEOUSLY IRREVERSIBLE   Lymphedema Assessments   Lymphedema Assessments Upper extremities   Right Upper Extremity Lymphedema   At Axilla  37 cm   15 cm Proximal to Olecranon Process 36.4 cm   10 cm Proximal to Olecranon Process 33.1 cm   Olecranon Process 26.9 cm   15 cm Proximal to Ulnar Styloid Process 24.9 cm   10 cm Proximal to Ulnar Styloid Process 21.1 cm  Just Proximal to Ulnar Styloid Process 17.2 cm   Across Hand at [employer] 19.9 cm   At [place] of 2nd Digit 7.8 cm   At [facility] of Thumb 8 cm   Left Upper Extremity Lymphedema   At Axilla  37.5 cm   15 cm Proximal to Olecranon Process 37.1 cm   10 cm Proximal to Olecranon Process 35.9 cm   Olecranon Process 31.1 cm   15 cm Proximal to Ulnar Styloid Process 28.1 cm   10 cm Proximal to Ulnar Styloid Process 24.5 cm   Just Proximal to Ulnar Styloid Process 19.1 cm   Across Hand at [employer] 20.2 cm   At [employer] of 2nd Digit 7.8 cm   At [facility] of Thumb 7.6 cm                        PT Education - 07/20/15 1602    Education provided Yes   Education Details educated on lymphedema compression pump, the use of compression bandages and when to begin wearing compression garments    Person(s) Educated Patient   Methods Explanation;Handout   Comprehension Verbalized understanding          PT Short Term Goals - 07/20/15 1614    PT SHORT TERM GOAL #1   Title Pt volume in her Lt UE  to decrase by 2 cm to allow better fit of clothing    Time 3   Period Weeks   Status New   PT SHORT TERM GOAL #2   Title Pt to be able to verbalize the signs and sx of cellulitis and the need to contact a MD immediately    Time 3   Period Weeks   Status New           PT Long Term Goals - 07/20/15 1612    PT LONG TERM GOAL #1   Title Pt  volume to be decreased by 2.5 cm  in pt Lt UE for pt to be ready for a compression garment.    Time 6   Period Weeks   Status New   PT LONG TERM GOAL #2   Title Pt to have obtained and to be able to don and doff compression garment to prevent increased edema of her Lt UE    Time 6   Period Weeks               Plan - 07/20/15 1604    Clinical Impression Statement Ms Aden is a 70 yo female who has been diagnosed with lymphedema for years.  She has never worn compression garments.  Her MD noted that her Lt UE had increased edema therefore she was referred to physical therapy.  The therapist explained the need to have compression bandaging and that due to lymphedema being a chronic condiditon that once the edema was controled she would need to wear compression garments everyday.  The patient was not sure if she wants to committ to this therefore it was decided that we would begin with manual decongestive therapy and have a compression pump ordered for the pt to use and see how this went.  The pt was given an order form for the short stretch badages if she changes her mind .  Ms. Girdler will benefit from decongestive techniques to decrease volume and improve her body image. Therapist has contacted [facility] re pump.   Pt already as garment prescription.    Rehab  Potential Good   PT Frequency 3x / week   PT Duration 6 weeks   PT Treatment/Interventions Manual lymph drainage;Compression bandaging;Therapeutic exercise   PT Next Visit Plan give UE exercises to promote lymphatic circulation; begin manual lymph drainage    Consulted and Agree with Plan of Care Patient      Patient will benefit from skilled therapeutic intervention in order to improve the following deficits and impairments:  Increased edema  Visit Diagnosis: Postmastectomy lymphedema - Plan: PT plan of care cert/re-cert      G-Codes - 48/35/07 1615    Functional Limitation Other PT primary   Other PT Primary Current  Status (D7322) At least 20 percent but less than 40 percent impaired, limited or restricted   Other PT Primary Goal Status (V6720) At least 1 percent but less than 20 percent impaired, limited or restricted       Problem List Patient Active Problem List   Diagnosis Date Noted  . Diverticulitis of colon 03/08/2015  . LLQ abdominal pain 01/22/2015  . Genetic testing 09/10/2014  . Acquired trigger finger 05/06/2013  . Abnormal genetic test 04/09/2013  . Osteopenia 12/11/2012  . Thyroid nodule 06/21/2012  . GERD (gastroesophageal reflux disease) 06/03/2012  . Adenomatous polyps 06/03/2012  . Breast cancer, right breast, IDC, Multifocal 03/12/2012  . Popliteal cyst 08/18/2010  . Hx Left breast cancer, UOQ, IDC, receptor negative 11/08/1993    Rayetta Humphrey, PT CLT [phone] 07/20/2015, 4:19 PM  [place] [address] [place], [place], [address] Phone: [phone]   Fax:  [phone]  Name: ANAYSHA ANDRE MRN: [id number] Date of Birth: 12/15/1945

## 2015-08-04 ENCOUNTER — Ambulatory Visit (HOSPITAL_COMMUNITY): Payer: PPO | Attending: Hematology & Oncology | Admitting: Physical Therapy

## 2015-08-04 DIAGNOSIS — I972 Postmastectomy lymphedema syndrome: Secondary | ICD-10-CM | POA: Diagnosis not present

## 2015-08-04 NOTE — Therapy (Signed)
[place], [place], [address] Phone: [phone]   Fax:  [phone]  Physical Therapy Treatment  Patient Details  Name: KAMIA INSALACO MRN: [id number] Date of Birth: 1945-07-10 Referring Provider: Ancil Linsey  Encounter Date: 08/04/2015      PT End of Session - 08/04/15 1536    Visit Number 2   Number of Visits 18   Date for PT Re-Evaluation 08/19/15   Authorization Type [employer]   Authorization - Visit Number 2   Authorization - Number of Visits 10   PT Start Time 1740   PT Stop Time 1114   PT Time Calculation (min) 39 min   Activity Tolerance Patient tolerated treatment well   Behavior During Therapy [facility] for tasks assessed/performed      Past Medical History:  Diagnosis Date  . Abnormal genetic test 04/09/2013   PALB2 and CHEK2 positivity   . Arthritis    "of the spine" (05/08/2012)  . Breast cancer ([place]) 1995   "left" (05/08/2012)  . Breast cancer, right breast, IDC, Multifocal 03/12/2012   Multifocal, 10 and 8 left breast   . Diabetes mellitus 1999   "dx; never on meds" (05/08/2012)  . Exertional shortness of breath   . GERD (gastroesophageal reflux disease)    tums  . H/O hiatal hernia   . Hemorrhoid   . History of blood transfusion    post child birth  . HTN (hypertension)   . IBS (irritable bowel syndrome)   . Osteopenia 12/11/2012   On Ca++ and Vit D. Bone density in April 2014.  Next bone density due in April 2016.  Marland Kitchen PONV (postoperative nausea and vomiting)   . PPD positive, treated 1999  . Sleep apnea    "gone after I had OR" (05/08/2012)    Past Surgical History:  Procedure Laterality Date  . ABDOMINAL HYSTERECTOMY Bilateral 01/30/2013   Procedure: HYSTERECTOMY ABDOMINAL;  Surgeon: Melina Schools, MD;  Location: [facility];  Service: Gynecology;  Laterality: Bilateral;  . BREAST BIOPSY Left 1995  . BREAST BIOPSY Bilateral 03/2012   "one on the left; 2 on the right"  (05/08/2012)  . BREAST LUMPECTOMY Left 1995  . CHOLECYSTECTOMY  1992  . COLONOSCOPY  12/06/93   Dr. Hayes:single diminutive polyp of the descending colon/extrernal hemorrhoids, benign path  . COLONOSCOPY  11/30/98   Dr Hayes:small rectal polyp/internal hemorrhoids, benign path  . COLONOSCOPY  11/26/2006   Dr. Hayes:sigmoid polyp/small interal hemorrhoids, adenomatous  . COLONOSCOPY WITH ESOPHAGOGASTRODUODENOSCOPY (EGD) N/A 07/01/2012   Dr. Oneida Alar: 2 small adenomas, sigmoid colon diverticula, surveillance in 2019  . DILATION AND CURETTAGE OF UTERUS  1968  . ESOPHAGEAL DILATION  07/01/2012   Dr. Oneida Alar: proximal esophageal web, Schatzki's ring at GE junction, s/p dilaiton. Mild non-erosive gastritis. Repeat EGD July 23, 2012.   . ESOPHAGOGASTRODUODENOSCOPY  1994   Dr. Cristina Gong: small hiatal hernia and Schatzki's ring widely patent, CLO test negative  . ESOPHAGOGASTRODUODENOSCOPY (EGD) WITH ESOPHAGEAL DILATION N/A 07/23/2012   Dr. Fields:proximal esophageal web v. radiation induced stricture/schatzki ring/small HH, s/p dilation  . MASTECTOMY COMPLETE / SIMPLE Left 05/08/2012  . MASTECTOMY COMPLETE / SIMPLE W/ SENTINEL NODE BIOPSY Right 05/08/2012  . SALPINGOOPHORECTOMY Bilateral 01/30/2013   Procedure: SALPINGO OOPHORECTOMY;  Surgeon: Melina Schools, MD;  Location: [facility];  Service: Gynecology;  Laterality: Bilateral;  . SIMPLE MASTECTOMY WITH AXILLARY SENTINEL NODE BIOPSY Right 05/08/2012   Procedure: RIGHT TOTAL MASTECTOMY WITH AXILLARY SENTINEL NODE BIOPSY;  Surgeon: Haywood Lasso, MD;  Location: [facility];  Service: General;  Laterality: Right;  . SIMPLE MASTECTOMY WITH AXILLARY SENTINEL NODE BIOPSY Left 05/08/2012   Procedure: LEFT TOTAL MASTECTOMY;  Surgeon: Haywood Lasso, MD;  Location: [facility];  Service: General;  Laterality: Left;  . TONSILLECTOMY AND ADENOIDECTOMY  ~ 2008  . TUBAL LIGATION  1976  . UVULOPALATOPHARYNGOPLASTY, TONSILLECTOMY AND SEPTOPLASTY  ~ 2008    There were no vitals  filed for this visit.      Subjective Assessment - 08/04/15 1532    Subjective Pt has no questions.     Pertinent History Rt breast cancer with 4 sentinel node removal (-); diagnosed with Lt preast cancer in 1996 with chemo and radiation;Bilateral masectomy 05/08/2012 Bilateral salpingo-oophorectomy.   Currently in Pain? No/denies                         [facility] Adult PT Treatment/Exercise - 08/04/15 0001      Manual Therapy   Manual Therapy Manual Lymphatic Drainage (MLD)   Manual Lymphatic Drainage (MLD) Including supraclavicular, deep and superficial abdominal, Rt  axillary/inguinal anastomosis and Rt UE.  Manual done both anterior and posteriorly with posterior done in side lying position.                   PT Short Term Goals - 08/04/15 1541      PT SHORT TERM GOAL #1   Title Pt volume in her Lt UE  to decrase by 2 cm to allow better fit of clothing    Time 3   Period Weeks   Status On-going     PT SHORT TERM GOAL #2   Title Pt to be able to verbalize the signs and sx of cellulitis and the need to contact a MD immediately    Time 3   Period Weeks   Status On-going           PT Long Term Goals - 08/04/15 1542      PT LONG TERM GOAL #1   Title Pt volume to be decreased by 2.5 cm  in pt Lt UE for pt to be ready for a compression garment.    Time 6   Period Weeks   Status On-going     PT LONG TERM GOAL #2   Title Pt to have obtained and to be able to don and doff compression garment to prevent increased edema of her Lt UE    Time 6   Period Weeks   Status On-going               Plan - 08/04/15 1536    Clinical Impression Statement Evaluation and goals discussed with patient.  Pt has recieved her pump but has not used it yet as she thought she was suppost to wait until therapy was over.  Therapist let patient know that she should start using the compression pump as soon as possible.  Pt tolerated manual decongestive techniques  well.     Rehab Potential Good   PT Frequency 3x / week   PT Duration 6 weeks   PT Treatment/Interventions Manual lymph drainage;Compression bandaging;Therapeutic exercise   PT Next Visit Plan give UE exercises to promote lymphatic circulation   Consulted and Agree with Plan of Care Patient      Patient will benefit from skilled therapeutic intervention in order to improve the following deficits and impairments:  Increased edema  Visit Diagnosis:  Postmastectomy lymphedema     Problem List Patient Active Problem List   Diagnosis Date Noted  . Diverticulitis of colon 03/08/2015  . LLQ abdominal pain 01/22/2015  . Genetic testing 09/10/2014  . Acquired trigger finger 05/06/2013  . Abnormal genetic test 04/09/2013  . Osteopenia 12/11/2012  . Thyroid nodule 06/21/2012  . GERD (gastroesophageal reflux disease) 06/03/2012  . Adenomatous polyps 06/03/2012  . Breast cancer, right breast, IDC, Multifocal 03/12/2012  . Popliteal cyst 08/18/2010  . Hx Left breast cancer, UOQ, IDC, receptor negative 11/08/1993    Rayetta Humphrey, PT CLT [phone] 08/04/2015, 3:43 PM  [facility] [address] [place], [place], [address] Phone: [phone]   Fax:  [phone]  Name: BASILIA STUCKERT MRN: [id number] Date of Birth: [birth year]

## 2015-08-06 ENCOUNTER — Ambulatory Visit (HOSPITAL_COMMUNITY): Payer: PPO | Admitting: Physical Therapy

## 2015-08-06 ENCOUNTER — Telehealth (HOSPITAL_COMMUNITY): Payer: Self-pay

## 2015-08-06 NOTE — Telephone Encounter (Signed)
Pt came by the office.... She wanted to just cx the appt.  Said she had to be at work at 3:00 and would feel rushed.

## 2015-08-09 ENCOUNTER — Ambulatory Visit (HOSPITAL_COMMUNITY): Payer: PPO | Admitting: Physical Therapy

## 2015-08-09 DIAGNOSIS — I972 Postmastectomy lymphedema syndrome: Secondary | ICD-10-CM | POA: Diagnosis not present

## 2015-08-09 NOTE — Therapy (Signed)
[facility] [place], [place], [address] Phone: [phone]   Fax:  [phone]  Physical Therapy Treatment  Patient Details  Name: Amy Houston MRN: [id number] Date of Birth: 06-02-1945 Referring Provider: Ancil Linsey  Encounter Date: 08/09/2015      PT End of Session - 08/09/15 1611    Visit Number 3   Number of Visits 18   Date for PT Re-Evaluation 08/19/15   Authorization Type [employer]   Authorization - Visit Number 3   Authorization - Number of Visits 10   PT Start Time 1856   PT Stop Time 1515   PT Time Calculation (min) 38 min   Activity Tolerance Patient tolerated treatment well   Behavior During Therapy [facility] for tasks assessed/performed      Past Medical History:  Diagnosis Date  . Abnormal genetic test 04/09/2013   PALB2 and CHEK2 positivity   . Arthritis    "of the spine" (05/08/2012)  . Breast cancer ([place]) 1995   "left" (05/08/2012)  . Breast cancer, right breast, IDC, Multifocal 03/12/2012   Multifocal, 10 and 8 left breast   . Diabetes mellitus 1999   "dx; never on meds" (05/08/2012)  . Exertional shortness of breath   . GERD (gastroesophageal reflux disease)    tums  . H/O hiatal hernia   . Hemorrhoid   . History of blood transfusion    post child birth  . HTN (hypertension)   . IBS (irritable bowel syndrome)   . Osteopenia 12/11/2012   On Ca++ and Vit D. Bone density in April 2014.  Next bone density due in April 2016.  Marland Kitchen PONV (postoperative nausea and vomiting)   . PPD positive, treated 1999  . Sleep apnea    "gone after I had OR" (05/08/2012)    Past Surgical History:  Procedure Laterality Date  . ABDOMINAL HYSTERECTOMY Bilateral 01/30/2013   Procedure: HYSTERECTOMY ABDOMINAL;  Surgeon: Melina Schools, MD;  Location: [facility];  Service: Gynecology;  Laterality: Bilateral;  . BREAST BIOPSY Left 1995  . BREAST BIOPSY Bilateral 03/2012   "one on the left; 2 on the right"  (05/08/2012)  . BREAST LUMPECTOMY Left 1995  . CHOLECYSTECTOMY  1992  . COLONOSCOPY  12/06/93   Dr. Hayes:single diminutive polyp of the descending colon/extrernal hemorrhoids, benign path  . COLONOSCOPY  11/30/98   Dr Hayes:small rectal polyp/internal hemorrhoids, benign path  . COLONOSCOPY  11/26/2006   Dr. Hayes:sigmoid polyp/small interal hemorrhoids, adenomatous  . COLONOSCOPY WITH ESOPHAGOGASTRODUODENOSCOPY (EGD) N/A 07/01/2012   Dr. Oneida Alar: 2 small adenomas, sigmoid colon diverticula, surveillance in 2019  . DILATION AND CURETTAGE OF UTERUS  1968  . ESOPHAGEAL DILATION  07/01/2012   Dr. Oneida Alar: proximal esophageal web, Schatzki's ring at GE junction, s/p dilaiton. Mild non-erosive gastritis. Repeat EGD July 23, 2012.   . ESOPHAGOGASTRODUODENOSCOPY  1994   Dr. Cristina Gong: small hiatal hernia and Schatzki's ring widely patent, CLO test negative  . ESOPHAGOGASTRODUODENOSCOPY (EGD) WITH ESOPHAGEAL DILATION N/A 07/23/2012   Dr. Fields:proximal esophageal web v. radiation induced stricture/schatzki ring/small HH, s/p dilation  . MASTECTOMY COMPLETE / SIMPLE Left 05/08/2012  . MASTECTOMY COMPLETE / SIMPLE W/ SENTINEL NODE BIOPSY Right 05/08/2012  . SALPINGOOPHORECTOMY Bilateral 01/30/2013   Procedure: SALPINGO OOPHORECTOMY;  Surgeon: Melina Schools, MD;  Location: [facility];  Service: Gynecology;  Laterality: Bilateral;  . SIMPLE MASTECTOMY WITH AXILLARY SENTINEL NODE BIOPSY Right 05/08/2012   Procedure: RIGHT TOTAL MASTECTOMY WITH AXILLARY SENTINEL NODE BIOPSY;  Surgeon: Haywood Lasso, MD;  Location: [facility];  Service: General;  Laterality: Right;  . SIMPLE MASTECTOMY WITH AXILLARY SENTINEL NODE BIOPSY Left 05/08/2012   Procedure: LEFT TOTAL MASTECTOMY;  Surgeon: Haywood Lasso, MD;  Location: [facility];  Service: General;  Laterality: Left;  . TONSILLECTOMY AND ADENOIDECTOMY  ~ 2008  . TUBAL LIGATION  1976  . UVULOPALATOPHARYNGOPLASTY, TONSILLECTOMY AND SEPTOPLASTY  ~ 2008    There were no vitals  filed for this visit.      Subjective Assessment - 08/09/15 1609    Subjective Pt questions when she will see results.  States that she still is not using her pump.    Pertinent History Rt breast cancer with 4 sentinel node removal (-); diagnosed with Lt preast cancer in 1996 with chemo and radiation;Bilateral masectomy 05/08/2012 Bilateral salpingo-oophorectomy.   Currently in Pain? No/denies                         [facility] Adult PT Treatment/Exercise - 08/09/15 0001      Manual Therapy   Manual Therapy Manual Lymphatic Drainage (MLD)   Manual Lymphatic Drainage (MLD) Including supraclavicular, deep and superficial abdominal, Rt  axillary/inguinal anastomosis and Rt UE.  Manual done both anterior and posteriorly with posterior done in side lying position.                   PT Short Term Goals - 08/09/15 1614      PT SHORT TERM GOAL #1   Title Pt volume in her Lt UE  to decrase by 2 cm to allow better fit of clothing    Time 3   Period Weeks   Status On-going     PT SHORT TERM GOAL #2   Title Pt to be able to verbalize the signs and sx of cellulitis and the need to contact a MD immediately    Time 3   Period Weeks   Status Achieved           PT Long Term Goals - 08/09/15 1614      PT LONG TERM GOAL #1   Title Pt volume to be decreased by 2.5 cm  in pt Lt UE for pt to be ready for a compression garment.    Time 6   Period Weeks   Status On-going     PT LONG TERM GOAL #2   Title Pt to have obtained and to be able to don and doff compression garment to prevent increased edema of her Lt UE    Time 6   Period Weeks   Status On-going               Plan - 08/09/15 1611    Clinical Impression Statement Therapist explained to pt that she the first visit was an evaluation.  She came the second visit but cancelled the third.  For decongestive techniques to work they must be done on a regular basis.  Pt urged to try and make all visits this week  and to begin using her pump then we will assess how she is progressing    Rehab Potential Good   PT Frequency 3x / week   PT Duration 6 weeks   PT Treatment/Interventions Manual lymph drainage;Compression bandaging;Therapeutic exercise   PT Next Visit Plan give UE exercises to promote lymphatic circulation; remeasure volume at the end of this week.    Consulted and Agree with Plan of Care Patient  Patient will benefit from skilled therapeutic intervention in order to improve the following deficits and impairments:  Increased edema  Visit Diagnosis: Postmastectomy lymphedema     Problem List Patient Active Problem List   Diagnosis Date Noted  . Diverticulitis of colon 03/08/2015  . LLQ abdominal pain 01/22/2015  . Genetic testing 09/10/2014  . Acquired trigger finger 05/06/2013  . Abnormal genetic test 04/09/2013  . Osteopenia 12/11/2012  . Thyroid nodule 06/21/2012  . GERD (gastroesophageal reflux disease) 06/03/2012  . Adenomatous polyps 06/03/2012  . Breast cancer, right breast, IDC, Multifocal 03/12/2012  . Popliteal cyst 08/18/2010  . Hx Left breast cancer, UOQ, IDC, receptor negative 11/08/1993    Rayetta Humphrey, PT CLT [phone] 08/09/2015, 4:15 PM  [place] [address] [place], [place], [address] Phone: [phone]   Fax:  [phone]  Name: Amy Houston MRN: [id number] Date of Birth: Sep 13, 1945

## 2015-08-11 ENCOUNTER — Ambulatory Visit (HOSPITAL_COMMUNITY): Payer: PPO | Admitting: Physical Therapy

## 2015-08-11 DIAGNOSIS — I972 Postmastectomy lymphedema syndrome: Secondary | ICD-10-CM | POA: Diagnosis not present

## 2015-08-11 NOTE — Therapy (Signed)
[facility] [address] [place], [place], [address] Phone: [phone]   Fax:  [phone]  Physical Therapy Treatment  Patient Details  Name: Amy Houston MRN: [id number] Date of Birth: August 29, 1945 Referring Provider: Ancil Linsey  Encounter Date: 08/11/2015      PT End of Session - 08/11/15 1806    Visit Number 4   Number of Visits 18   Date for PT Re-Evaluation 08/19/15   Authorization Type [employer]   Authorization - Visit Number 4   Authorization - Number of Visits 10   PT Start Time 1435   PT Stop Time 1518   PT Time Calculation (min) 43 min   Activity Tolerance Patient tolerated treatment well   Behavior During Therapy [facility] for tasks assessed/performed      Past Medical History:  Diagnosis Date  . Abnormal genetic test 04/09/2013   PALB2 and CHEK2 positivity   . Arthritis    "of the spine" (05/08/2012)  . Breast cancer ([place]) 1995   "left" (05/08/2012)  . Breast cancer, right breast, IDC, Multifocal 03/12/2012   Multifocal, 10 and 8 left breast   . Diabetes mellitus 1999   "dx; never on meds" (05/08/2012)  . Exertional shortness of breath   . GERD (gastroesophageal reflux disease)    tums  . H/O hiatal hernia   . Hemorrhoid   . History of blood transfusion    post child birth  . HTN (hypertension)   . IBS (irritable bowel syndrome)   . Osteopenia 12/11/2012   On Ca++ and Vit D. Bone density in April 2014.  Next bone density due in April 2016.  Marland Kitchen PONV (postoperative nausea and vomiting)   . PPD positive, treated 1999  . Sleep apnea    "gone after I had OR" (05/08/2012)    Past Surgical History:  Procedure Laterality Date  . ABDOMINAL HYSTERECTOMY Bilateral 01/30/2013   Procedure: HYSTERECTOMY ABDOMINAL;  Surgeon: Melina Schools, MD;  Location: [facility];  Service: Gynecology;  Laterality: Bilateral;  . BREAST BIOPSY Left 1995  . BREAST BIOPSY Bilateral 03/2012   "one on the left; 2 on the right"  (05/08/2012)  . BREAST LUMPECTOMY Left 1995  . CHOLECYSTECTOMY  1992  . COLONOSCOPY  12/06/93   Dr. Hayes:single diminutive polyp of the descending colon/extrernal hemorrhoids, benign path  . COLONOSCOPY  11/30/98   Dr Hayes:small rectal polyp/internal hemorrhoids, benign path  . COLONOSCOPY  11/26/2006   Dr. Hayes:sigmoid polyp/small interal hemorrhoids, adenomatous  . COLONOSCOPY WITH ESOPHAGOGASTRODUODENOSCOPY (EGD) N/A 07/01/2012   Dr. Oneida Alar: 2 small adenomas, sigmoid colon diverticula, surveillance in 2019  . DILATION AND CURETTAGE OF UTERUS  1968  . ESOPHAGEAL DILATION  07/01/2012   Dr. Oneida Alar: proximal esophageal web, Schatzki's ring at GE junction, s/p dilaiton. Mild non-erosive gastritis. Repeat EGD July 23, 2012.   . ESOPHAGOGASTRODUODENOSCOPY  1994   Dr. Cristina Gong: small hiatal hernia and Schatzki's ring widely patent, CLO test negative  . ESOPHAGOGASTRODUODENOSCOPY (EGD) WITH ESOPHAGEAL DILATION N/A 07/23/2012   Dr. Fields:proximal esophageal web v. radiation induced stricture/schatzki ring/small HH, s/p dilation  . MASTECTOMY COMPLETE / SIMPLE Left 05/08/2012  . MASTECTOMY COMPLETE / SIMPLE W/ SENTINEL NODE BIOPSY Right 05/08/2012  . SALPINGOOPHORECTOMY Bilateral 01/30/2013   Procedure: SALPINGO OOPHORECTOMY;  Surgeon: Melina Schools, MD;  Location: [facility];  Service: Gynecology;  Laterality: Bilateral;  . SIMPLE MASTECTOMY WITH AXILLARY SENTINEL NODE BIOPSY Right 05/08/2012   Procedure: RIGHT TOTAL MASTECTOMY WITH AXILLARY SENTINEL NODE BIOPSY;  Surgeon: Haywood Lasso, MD;  Location: [facility];  Service: General;  Laterality: Right;  . SIMPLE MASTECTOMY WITH AXILLARY SENTINEL NODE BIOPSY Left 05/08/2012   Procedure: LEFT TOTAL MASTECTOMY;  Surgeon: Haywood Lasso, MD;  Location: [facility];  Service: General;  Laterality: Left;  . TONSILLECTOMY AND ADENOIDECTOMY  ~ 2008  . TUBAL LIGATION  1976  . UVULOPALATOPHARYNGOPLASTY, TONSILLECTOMY AND SEPTOPLASTY  ~ 2008    There were no vitals  filed for this visit.      Subjective Assessment - 08/11/15 1803    Subjective Pt states she is going to the beach tomorrow for the weekend.  STates she is not bringing her pump.   Currently in Pain? No/denies               LYMPHEDEMA/ONCOLOGY QUESTIONNAIRE - 08/11/15 1511      Left Upper Extremity Lymphedema   At Axilla  37.5 cm  was 37.5   15 cm Proximal to Olecranon Process 35.8 cm  was 37.1   10 cm Proximal to Olecranon Process 35.3 cm  was 35.9   Olecranon Process 30.6 cm  was 31.1   15 cm Proximal to Ulnar Styloid Process 28.8 cm  was 28.1   10 cm Proximal to Ulnar Styloid Process 25 cm  was 24.5   Just Proximal to Ulnar Styloid Process 17.5 cm  was 19.1   Across Hand at [employer] 19.2 cm  was 20.2   At [employer] of 2nd Digit 6.7 cm  was 7.8   At [facility] of Thumb 7.2 cm  was 7.6                  [facility] Adult PT Treatment/Exercise - 08/11/15 0001      Manual Therapy   Manual Therapy Manual Lymphatic Drainage (MLD)   Manual Lymphatic Drainage (MLD) Including supraclavicular, deep and superficial abdominal, Rt  axillary/inguinal anastomosis and Rt UE.  Manual done both anterior and posteriorly with posterior done in side lying position.                   PT Short Term Goals - 08/09/15 1614      PT SHORT TERM GOAL #1   Title Pt volume in her Lt UE  to decrase by 2 cm to allow better fit of clothing    Time 3   Period Weeks   Status On-going     PT SHORT TERM GOAL #2   Title Pt to be able to verbalize the signs and sx of cellulitis and the need to contact a MD immediately    Time 3   Period Weeks   Status Achieved           PT Long Term Goals - 08/09/15 1614      PT LONG TERM GOAL #1   Title Pt volume to be decreased by 2.5 cm  in pt Lt UE for pt to be ready for a compression garment.    Time 6   Period Weeks   Status On-going     PT LONG TERM GOAL #2   Title Pt to have obtained and to be able to don and doff compression  garment to prevent increased edema of her Lt UE    Time 6   Period Weeks   Status On-going               Plan - 08/11/15 1806    Clinical Impression Statement Lt UE remeasured today.  Overall reduction noted, especially in proximal and distal sections.  Most induration around forearm region.  Pt with questions regarding compression.  pt shown short stretch bandages and different UE garments.  Pt also shown a reidsleeve and given to try out overnight.  Manual lymph drainage completed both anteriorly and posteriorly.   Rehab Potential Good   PT Frequency 3x / week   PT Duration 6 weeks   PT Treatment/Interventions Manual lymph drainage;Compression bandaging;Therapeutic exercise   PT Next Visit Plan Give UE exercises next session to promote lymphatic circulation.  Weekly measurements for  volume.   Consulted and Agree with Plan of Care Patient      Patient will benefit from skilled therapeutic intervention in order to improve the following deficits and impairments:  Increased edema  Visit Diagnosis: Postmastectomy lymphedema     Problem List Patient Active Problem List   Diagnosis Date Noted  . Diverticulitis of colon 03/08/2015  . LLQ abdominal pain 01/22/2015  . Genetic testing 09/10/2014  . Acquired trigger finger 05/06/2013  . Abnormal genetic test 04/09/2013  . Osteopenia 12/11/2012  . Thyroid nodule 06/21/2012  . GERD (gastroesophageal reflux disease) 06/03/2012  . Adenomatous polyps 06/03/2012  . Breast cancer, right breast, IDC, Multifocal 03/12/2012  . Popliteal cyst 08/18/2010  . Hx Left breast cancer, UOQ, IDC, receptor negative 11/08/1993    Teena Irani 08/11/2015, 6:10 PM  [place], [place], [address] Phone: [phone]   Fax:  [phone]  Name: DONIELLE KAIGLER MRN: [id number] Date of Birth: 1945-11-09

## 2015-08-13 ENCOUNTER — Encounter (HOSPITAL_COMMUNITY): Payer: PPO | Admitting: Physical Therapy

## 2015-08-16 ENCOUNTER — Ambulatory Visit (HOSPITAL_COMMUNITY): Payer: PPO | Admitting: Physical Therapy

## 2015-08-16 DIAGNOSIS — I972 Postmastectomy lymphedema syndrome: Secondary | ICD-10-CM | POA: Diagnosis not present

## 2015-08-16 NOTE — Therapy (Signed)
[place], [place], [address] Phone: [phone]   Fax:  [phone]  Physical Therapy Treatment  Patient Details  Name: Amy Houston MRN: [id number] Date of Birth: November 04, 1945 Referring Provider: Ancil Linsey  Encounter Date: 08/16/2015      PT End of Session - 08/16/15 1438    Visit Number 5   Number of Visits 18   Date for PT Re-Evaluation 08/19/15   Authorization Type [employer]   Authorization - Visit Number 5   Authorization - Number of Visits 10   PT Start Time 1350   PT Stop Time 1430   PT Time Calculation (min) 40 min   Activity Tolerance Patient tolerated treatment well   Behavior During Therapy [facility] for tasks assessed/performed      Past Medical History:  Diagnosis Date  . Abnormal genetic test 04/09/2013   PALB2 and CHEK2 positivity   . Arthritis    "of the spine" (05/08/2012)  . Breast cancer ([place]) 1995   "left" (05/08/2012)  . Breast cancer, right breast, IDC, Multifocal 03/12/2012   Multifocal, 10 and 8 left breast   . Diabetes mellitus 1999   "dx; never on meds" (05/08/2012)  . Exertional shortness of breath   . GERD (gastroesophageal reflux disease)    tums  . H/O hiatal hernia   . Hemorrhoid   . History of blood transfusion    post child birth  . HTN (hypertension)   . IBS (irritable bowel syndrome)   . Osteopenia 12/11/2012   On Ca++ and Vit D. Bone density in April 2014.  Next bone density due in April 2016.  Marland Kitchen PONV (postoperative nausea and vomiting)   . PPD positive, treated 1999  . Sleep apnea    "gone after I had OR" (05/08/2012)    Past Surgical History:  Procedure Laterality Date  . ABDOMINAL HYSTERECTOMY Bilateral 01/30/2013   Procedure: HYSTERECTOMY ABDOMINAL;  Surgeon: Melina Schools, MD;  Location: [facility];  Service: Gynecology;  Laterality: Bilateral;  . BREAST BIOPSY Left 1995  . BREAST BIOPSY Bilateral 03/2012   "one on the left; 2 on the right"  (05/08/2012)  . BREAST LUMPECTOMY Left 1995  . CHOLECYSTECTOMY  1992  . COLONOSCOPY  12/06/93   Dr. Hayes:single diminutive polyp of the descending colon/extrernal hemorrhoids, benign path  . COLONOSCOPY  11/30/98   Dr Hayes:small rectal polyp/internal hemorrhoids, benign path  . COLONOSCOPY  11/26/2006   Dr. Hayes:sigmoid polyp/small interal hemorrhoids, adenomatous  . COLONOSCOPY WITH ESOPHAGOGASTRODUODENOSCOPY (EGD) N/A 07/01/2012   Dr. Oneida Alar: 2 small adenomas, sigmoid colon diverticula, surveillance in 2019  . DILATION AND CURETTAGE OF UTERUS  1968  . ESOPHAGEAL DILATION  07/01/2012   Dr. Oneida Alar: proximal esophageal web, Schatzki's ring at GE junction, s/p dilaiton. Mild non-erosive gastritis. Repeat EGD July 23, 2012.   . ESOPHAGOGASTRODUODENOSCOPY  1994   Dr. Cristina Gong: small hiatal hernia and Schatzki's ring widely patent, CLO test negative  . ESOPHAGOGASTRODUODENOSCOPY (EGD) WITH ESOPHAGEAL DILATION N/A 07/23/2012   Dr. Fields:proximal esophageal web v. radiation induced stricture/schatzki ring/small HH, s/p dilation  . MASTECTOMY COMPLETE / SIMPLE Left 05/08/2012  . MASTECTOMY COMPLETE / SIMPLE W/ SENTINEL NODE BIOPSY Right 05/08/2012  . SALPINGOOPHORECTOMY Bilateral 01/30/2013   Procedure: SALPINGO OOPHORECTOMY;  Surgeon: Melina Schools, MD;  Location: [facility];  Service: Gynecology;  Laterality: Bilateral;  . SIMPLE MASTECTOMY WITH AXILLARY SENTINEL NODE BIOPSY Right 05/08/2012   Procedure: RIGHT TOTAL MASTECTOMY WITH AXILLARY SENTINEL NODE BIOPSY;  Surgeon: Haywood Lasso, MD;  Location: [facility];  Service: General;  Laterality: Right;  . SIMPLE MASTECTOMY WITH AXILLARY SENTINEL NODE BIOPSY Left 05/08/2012   Procedure: LEFT TOTAL MASTECTOMY;  Surgeon: Haywood Lasso, MD;  Location: [facility];  Service: General;  Laterality: Left;  . TONSILLECTOMY AND ADENOIDECTOMY  ~ 2008  . TUBAL LIGATION  1976  . UVULOPALATOPHARYNGOPLASTY, TONSILLECTOMY AND SEPTOPLASTY  ~ 2008    There were no vitals  filed for this visit.      Subjective Assessment - 08/16/15 1438    Subjective Pt states she had a good time at the beach.  States she did not do much and had no increased swelling in her UE.    Currently in Pain? No/denies                         [facility] Adult PT Treatment/Exercise - 08/16/15 0001      Manual Therapy   Manual Therapy Manual Lymphatic Drainage (MLD)   Manual Lymphatic Drainage (MLD) Including supraclavicular, deep and superficial abdominal, Rt  axillary/inguinal anastomosis and Rt UE.  Manual done both anterior and posteriorly with posterior done in side lying position.                   PT Short Term Goals - 08/09/15 1614      PT SHORT TERM GOAL #1   Title Pt volume in her Lt UE  to decrase by 2 cm to allow better fit of clothing    Time 3   Period Weeks   Status On-going     PT SHORT TERM GOAL #2   Title Pt to be able to verbalize the signs and sx of cellulitis and the need to contact a MD immediately    Time 3   Period Weeks   Status Achieved           PT Long Term Goals - 08/09/15 1614      PT LONG TERM GOAL #1   Title Pt volume to be decreased by 2.5 cm  in pt Lt UE for pt to be ready for a compression garment.    Time 6   Period Weeks   Status On-going     PT LONG TERM GOAL #2   Title Pt to have obtained and to be able to don and doff compression garment to prevent increased edema of her Lt UE    Time 6   Period Weeks   Status On-going               Plan - 08/16/15 1439    Clinical Impression Statement continued with manual lymph drainage for Lt UE routing to bilateral inguinal nodes. Manual completed both anterior and posterioly.     Rehab Potential Good   PT Frequency 3x / week   PT Duration 6 weeks   PT Treatment/Interventions Manual lymph drainage;Compression bandaging;Therapeutic exercise   PT Next Visit Plan Continue manual lymph drainage for Lt UE.   Weekly measurements for  volume.   Consulted  and Agree with Plan of Care Patient      Patient will benefit from skilled therapeutic intervention in order to improve the following deficits and impairments:  Increased edema  Visit Diagnosis: Postmastectomy lymphedema     Problem List Patient Active Problem List   Diagnosis Date Noted  . Diverticulitis of colon 03/08/2015  . LLQ abdominal pain 01/22/2015  . Genetic testing 09/10/2014  .  Acquired trigger finger 05/06/2013  . Abnormal genetic test 04/09/2013  . Osteopenia 12/11/2012  . Thyroid nodule 06/21/2012  . GERD (gastroesophageal reflux disease) 06/03/2012  . Adenomatous polyps 06/03/2012  . Breast cancer, right breast, IDC, Multifocal 03/12/2012  . Popliteal cyst 08/18/2010  . Hx Left breast cancer, UOQ, IDC, receptor negative 11/08/1993    Amy Houston, PTA/CLT [phone]  08/16/2015, 2:40 PM  [facility] [address] [place], [place], [address] Phone: [phone]   Fax:  [phone]  Name: Amy Houston MRN: [id number] Date of Birth: [birth year]

## 2015-08-18 ENCOUNTER — Ambulatory Visit (HOSPITAL_COMMUNITY): Payer: PPO | Admitting: Physical Therapy

## 2015-08-18 DIAGNOSIS — I972 Postmastectomy lymphedema syndrome: Secondary | ICD-10-CM

## 2015-08-18 NOTE — Therapy (Signed)
[facility] [place], [place], [address] Phone: [phone]   Fax:  [phone]  Physical Therapy Treatment  Patient Details  Name: Amy Houston MRN: [id number] Date of Birth: 07/07/1945 Referring Provider: Ancil Linsey  Encounter Date: 08/18/2015      PT End of Session - 08/18/15 1517    Visit Number 6   Number of Visits 18   Date for PT Re-Evaluation 08/19/15   Authorization Type [employer]   Authorization - Visit Number 6   Authorization - Number of Visits 10   PT Start Time 0623   PT Stop Time 1516   PT Time Calculation (min) 42 min   Activity Tolerance Patient tolerated treatment well   Behavior During Therapy [facility] for tasks assessed/performed      Past Medical History:  Diagnosis Date  . Abnormal genetic test 04/09/2013   PALB2 and CHEK2 positivity   . Arthritis    "of the spine" (05/08/2012)  . Breast cancer ([place]) 1995   "left" (05/08/2012)  . Breast cancer, right breast, IDC, Multifocal 03/12/2012   Multifocal, 10 and 8 left breast   . Diabetes mellitus 1999   "dx; never on meds" (05/08/2012)  . Exertional shortness of breath   . GERD (gastroesophageal reflux disease)    tums  . H/O hiatal hernia   . Hemorrhoid   . History of blood transfusion    post child birth  . HTN (hypertension)   . IBS (irritable bowel syndrome)   . Osteopenia 12/11/2012   On Ca++ and Vit D. Bone density in April 2014.  Next bone density due in April 2016.  Marland Kitchen PONV (postoperative nausea and vomiting)   . PPD positive, treated 1999  . Sleep apnea    "gone after I had OR" (05/08/2012)    Past Surgical History:  Procedure Laterality Date  . ABDOMINAL HYSTERECTOMY Bilateral 01/30/2013   Procedure: HYSTERECTOMY ABDOMINAL;  Surgeon: Melina Schools, MD;  Location: [facility];  Service: Gynecology;  Laterality: Bilateral;  . BREAST BIOPSY Left 1995  . BREAST BIOPSY Bilateral 03/2012   "one on the left; 2 on the right"  (05/08/2012)  . BREAST LUMPECTOMY Left 1995  . CHOLECYSTECTOMY  1992  . COLONOSCOPY  12/06/93   Dr. Hayes:single diminutive polyp of the descending colon/extrernal hemorrhoids, benign path  . COLONOSCOPY  11/30/98   Dr Hayes:small rectal polyp/internal hemorrhoids, benign path  . COLONOSCOPY  11/26/2006   Dr. Hayes:sigmoid polyp/small interal hemorrhoids, adenomatous  . COLONOSCOPY WITH ESOPHAGOGASTRODUODENOSCOPY (EGD) N/A 07/01/2012   Dr. Oneida Alar: 2 small adenomas, sigmoid colon diverticula, surveillance in 2019  . DILATION AND CURETTAGE OF UTERUS  1968  . ESOPHAGEAL DILATION  07/01/2012   Dr. Oneida Alar: proximal esophageal web, Schatzki's ring at GE junction, s/p dilaiton. Mild non-erosive gastritis. Repeat EGD July 23, 2012.   . ESOPHAGOGASTRODUODENOSCOPY  1994   Dr. Cristina Gong: small hiatal hernia and Schatzki's ring widely patent, CLO test negative  . ESOPHAGOGASTRODUODENOSCOPY (EGD) WITH ESOPHAGEAL DILATION N/A 07/23/2012   Dr. Fields:proximal esophageal web v. radiation induced stricture/schatzki ring/small HH, s/p dilation  . MASTECTOMY COMPLETE / SIMPLE Left 05/08/2012  . MASTECTOMY COMPLETE / SIMPLE W/ SENTINEL NODE BIOPSY Right 05/08/2012  . SALPINGOOPHORECTOMY Bilateral 01/30/2013   Procedure: SALPINGO OOPHORECTOMY;  Surgeon: Melina Schools, MD;  Location: [facility];  Service: Gynecology;  Laterality: Bilateral;  . SIMPLE MASTECTOMY WITH AXILLARY SENTINEL NODE BIOPSY Right 05/08/2012   Procedure: RIGHT TOTAL MASTECTOMY WITH AXILLARY SENTINEL NODE BIOPSY;  Surgeon: Haywood Lasso, MD;  Location: [facility];  Service: General;  Laterality: Right;  . SIMPLE MASTECTOMY WITH AXILLARY SENTINEL NODE BIOPSY Left 05/08/2012   Procedure: LEFT TOTAL MASTECTOMY;  Surgeon: Haywood Lasso, MD;  Location: [facility];  Service: General;  Laterality: Left;  . TONSILLECTOMY AND ADENOIDECTOMY  ~ 2008  . TUBAL LIGATION  1976  . UVULOPALATOPHARYNGOPLASTY, TONSILLECTOMY AND SEPTOPLASTY  ~ 2008    There were no vitals  filed for this visit.      Subjective Assessment - 08/18/15 1515    Subjective Pt states she was measured last treatment and there was reduction but she is not feeling much difference.  States that she has started using the compression pump but is only using it every other day.    Pertinent History Rt breast cancer with 4 sentinel node removal (-); diagnosed with Lt preast cancer in 1996 with chemo and radiation;Bilateral masectomy 05/08/2012 Bilateral salpingo-oophorectomy.   Currently in Pain? No/denies                         [facility] Adult PT Treatment/Exercise - 08/18/15 0001      Manual Therapy   Manual Therapy Manual Lymphatic Drainage (MLD)   Manual Lymphatic Drainage (MLD) Including supraclavicular, deep and superficial abdominal, Rt  axillary/inguinal anastomosis and Rt UE.  Manual done both anterior and posteriorly with posterior done in side lying position.                 PT Education - 08/18/15 1516    Education provided Yes   Education Details The importance of using the compression pump every day and not every other day   Person(s) Educated Patient   Methods Explanation   Comprehension Verbalized understanding          PT Short Term Goals - 08/18/15 1518      PT SHORT TERM GOAL #1   Title Pt volume in her Lt UE  to decrase by 2 cm to allow better fit of clothing    Time 3   Period Weeks   Status On-going     PT SHORT TERM GOAL #2   Title Pt to be able to verbalize the signs and sx of cellulitis and the need to contact a MD immediately    Time 3   Period Weeks   Status Achieved           PT Long Term Goals - 08/18/15 1518      PT LONG TERM GOAL #1   Title Pt volume to be decreased by 2.5 cm  in pt Lt UE for pt to be ready for a compression garment.    Time 6   Period Weeks   Status On-going     PT LONG TERM GOAL #2   Title Pt to have obtained and to be able to don and doff compression garment to prevent increased edema of her  Lt UE    Time 6   Period Weeks   Status On-going               Plan - 08/18/15 1517    Clinical Impression Statement Continued with manual to pt with moderate gains.  Pt to begin using the compression pump everyday.    Rehab Potential Good   PT Frequency 3x / week   PT Duration 6 weeks   PT Treatment/Interventions Manual lymph drainage;Compression bandaging;Therapeutic exercise   PT Next Visit Plan Readdress  the use of short stretch compression wrapping with patient.    Consulted and Agree with Plan of Care Patient      Patient will benefit from skilled therapeutic intervention in order to improve the following deficits and impairments:  Increased edema  Visit Diagnosis: Postmastectomy lymphedema     Problem List Patient Active Problem List   Diagnosis Date Noted  . Diverticulitis of colon 03/08/2015  . LLQ abdominal pain 01/22/2015  . Genetic testing 09/10/2014  . Acquired trigger finger 05/06/2013  . Abnormal genetic test 04/09/2013  . Osteopenia 12/11/2012  . Thyroid nodule 06/21/2012  . GERD (gastroesophageal reflux disease) 06/03/2012  . Adenomatous polyps 06/03/2012  . Breast cancer, right breast, IDC, Multifocal 03/12/2012  . Popliteal cyst 08/18/2010  . Hx Left breast cancer, UOQ, IDC, receptor negative 11/08/1993   Rayetta Humphrey, PT CLT [phone] 08/18/2015, 3:19 PM  [facility] [address] [place], [place], [address] Phone: [phone]   Fax:  [phone]  Name: Amy Houston MRN: [id number] Date of Birth: 1945-10-16

## 2015-08-20 ENCOUNTER — Ambulatory Visit (HOSPITAL_COMMUNITY): Payer: PPO | Admitting: Physical Therapy

## 2015-08-20 ENCOUNTER — Telehealth (HOSPITAL_COMMUNITY): Payer: Self-pay

## 2015-08-20 NOTE — Telephone Encounter (Signed)
Called to cancel - going to a [employer]

## 2015-08-24 ENCOUNTER — Telehealth (HOSPITAL_COMMUNITY): Payer: Self-pay | Admitting: Physical Therapy

## 2015-08-24 NOTE — Telephone Encounter (Signed)
Pt had a death in the family

## 2015-08-25 ENCOUNTER — Ambulatory Visit (HOSPITAL_COMMUNITY): Payer: PPO | Admitting: Physical Therapy

## 2015-08-30 ENCOUNTER — Ambulatory Visit (HOSPITAL_COMMUNITY): Payer: PPO | Admitting: Physical Therapy

## 2015-08-30 DIAGNOSIS — I972 Postmastectomy lymphedema syndrome: Secondary | ICD-10-CM

## 2015-08-30 NOTE — Therapy (Signed)
[place], [place], [address] Phone: [phone]   Fax:  [phone]  Physical Therapy Treatment  Patient Details  Name: Amy Houston MRN: [id number] Date of Birth: 25-Jun-1945 Referring Provider: Ancil Linsey  Encounter Date: 08/30/2015      PT End of Session - 08/30/15 1327    Visit Number 7   Number of Visits 18   Date for PT Re-Evaluation 08/19/15   Authorization Type [employer]   Authorization - Visit Number 7   Authorization - Number of Visits 10   PT Start Time 0950   PT Stop Time 1030   PT Time Calculation (min) 40 min   Activity Tolerance Patient tolerated treatment well   Behavior During Therapy [facility] for tasks assessed/performed      Past Medical History:  Diagnosis Date  . Abnormal genetic test 04/09/2013   PALB2 and CHEK2 positivity   . Arthritis    "of the spine" (05/08/2012)  . Breast cancer ([place]) 1995   "left" (05/08/2012)  . Breast cancer, right breast, IDC, Multifocal 03/12/2012   Multifocal, 10 and 8 left breast   . Diabetes mellitus 1999   "dx; never on meds" (05/08/2012)  . Exertional shortness of breath   . GERD (gastroesophageal reflux disease)    tums  . H/O hiatal hernia   . Hemorrhoid   . History of blood transfusion    post child birth  . HTN (hypertension)   . IBS (irritable bowel syndrome)   . Osteopenia 12/11/2012   On Ca++ and Vit D. Bone density in April 2014.  Next bone density due in April 2016.  Marland Kitchen PONV (postoperative nausea and vomiting)   . PPD positive, treated 1999  . Sleep apnea    "gone after I had OR" (05/08/2012)    Past Surgical History:  Procedure Laterality Date  . ABDOMINAL HYSTERECTOMY Bilateral 01/30/2013   Procedure: HYSTERECTOMY ABDOMINAL;  Surgeon: Melina Schools, MD;  Location: [facility];  Service: Gynecology;  Laterality: Bilateral;  . BREAST BIOPSY Left 1995  . BREAST BIOPSY Bilateral 03/2012   "one on the left; 2 on the right"  (05/08/2012)  . BREAST LUMPECTOMY Left 1995  . CHOLECYSTECTOMY  1992  . COLONOSCOPY  12/06/93   Dr. Hayes:single diminutive polyp of the descending colon/extrernal hemorrhoids, benign path  . COLONOSCOPY  11/30/98   Dr Hayes:small rectal polyp/internal hemorrhoids, benign path  . COLONOSCOPY  11/26/2006   Dr. Hayes:sigmoid polyp/small interal hemorrhoids, adenomatous  . COLONOSCOPY WITH ESOPHAGOGASTRODUODENOSCOPY (EGD) N/A 07/01/2012   Dr. Oneida Alar: 2 small adenomas, sigmoid colon diverticula, surveillance in 2019  . DILATION AND CURETTAGE OF UTERUS  1968  . ESOPHAGEAL DILATION  07/01/2012   Dr. Oneida Alar: proximal esophageal web, Schatzki's ring at GE junction, s/p dilaiton. Mild non-erosive gastritis. Repeat EGD July 23, 2012.   . ESOPHAGOGASTRODUODENOSCOPY  1994   Dr. Cristina Gong: small hiatal hernia and Schatzki's ring widely patent, CLO test negative  . ESOPHAGOGASTRODUODENOSCOPY (EGD) WITH ESOPHAGEAL DILATION N/A 07/23/2012   Dr. Fields:proximal esophageal web v. radiation induced stricture/schatzki ring/small HH, s/p dilation  . MASTECTOMY COMPLETE / SIMPLE Left 05/08/2012  . MASTECTOMY COMPLETE / SIMPLE W/ SENTINEL NODE BIOPSY Right 05/08/2012  . SALPINGOOPHORECTOMY Bilateral 01/30/2013   Procedure: SALPINGO OOPHORECTOMY;  Surgeon: Melina Schools, MD;  Location: [facility];  Service: Gynecology;  Laterality: Bilateral;  . SIMPLE MASTECTOMY WITH AXILLARY SENTINEL NODE BIOPSY Right 05/08/2012   Procedure: RIGHT TOTAL MASTECTOMY WITH AXILLARY SENTINEL NODE BIOPSY;  Surgeon: Haywood Lasso, MD;  Location: [facility];  Service: General;  Laterality: Right;  . SIMPLE MASTECTOMY WITH AXILLARY SENTINEL NODE BIOPSY Left 05/08/2012   Procedure: LEFT TOTAL MASTECTOMY;  Surgeon: Haywood Lasso, MD;  Location: [facility];  Service: General;  Laterality: Left;  . TONSILLECTOMY AND ADENOIDECTOMY  ~ 2008  . TUBAL LIGATION  1976  . UVULOPALATOPHARYNGOPLASTY, TONSILLECTOMY AND SEPTOPLASTY  ~ 2008    There were no vitals  filed for this visit.      Subjective Assessment - 08/30/15 1326    Subjective Pt cancelled last appointment.  States that she has not been using her pump.    Pertinent History Rt breast cancer with 4 sentinel node removal (-); diagnosed with Lt preast cancer in 1996 with chemo and radiation;Bilateral masectomy 05/08/2012 Bilateral salpingo-oophorectomy.   Currently in Pain? No/denies                         [facility] Adult PT Treatment/Exercise - 08/30/15 0001      Manual Therapy   Manual Therapy Manual Lymphatic Drainage (MLD)   Manual Lymphatic Drainage (MLD) Including supraclavicular, deep and superficial abdominal, Rt  axillary/inguinal anastomosis and Rt UE.  Manual done both anterior and posteriorly with posterior done in side lying position.                   PT Short Term Goals - 08/18/15 1518      PT SHORT TERM GOAL #1   Title Pt volume in her Lt UE  to decrase by 2 cm to allow better fit of clothing    Time 3   Period Weeks   Status On-going     PT SHORT TERM GOAL #2   Title Pt to be able to verbalize the signs and sx of cellulitis and the need to contact a MD immediately    Time 3   Period Weeks   Status Achieved           PT Long Term Goals - 08/18/15 1518      PT LONG TERM GOAL #1   Title Pt volume to be decreased by 2.5 cm  in pt Lt UE for pt to be ready for a compression garment.    Time 6   Period Weeks   Status On-going     PT LONG TERM GOAL #2   Title Pt to have obtained and to be able to don and doff compression garment to prevent increased edema of her Lt UE    Time 6   Period Weeks   Status On-going               Plan - 08/30/15 1327    Clinical Impression Statement Pt encouraged to use pump everyday for an hour.  Pt was also urged to by spandex compression for her trunk area.   Therapist did not measure pt due to pt not returning in over a week and not using her compression pump.  Pt verbalized understanding that  if she was not doing what she is suppose to be at home she will not see much improvement.    Rehab Potential Good   PT Frequency 3x / week   PT Duration 6 weeks   PT Treatment/Interventions Manual lymph drainage;Compression bandaging;Therapeutic exercise   PT Next Visit Plan measure, discuss the use of short stretch bandages and give pt sheet of places that she can obtain compression garment; (pt has  prescription for garment from MD already).    Consulted and Agree with Plan of Care Patient      Patient will benefit from skilled therapeutic intervention in order to improve the following deficits and impairments:  Increased edema  Visit Diagnosis: Postmastectomy lymphedema     Problem List Patient Active Problem List   Diagnosis Date Noted  . Diverticulitis of colon 03/08/2015  . LLQ abdominal pain 01/22/2015  . Genetic testing 09/10/2014  . Acquired trigger finger 05/06/2013  . Abnormal genetic test 04/09/2013  . Osteopenia 12/11/2012  . Thyroid nodule 06/21/2012  . GERD (gastroesophageal reflux disease) 06/03/2012  . Adenomatous polyps 06/03/2012  . Breast cancer, right breast, IDC, Multifocal 03/12/2012  . Popliteal cyst 08/18/2010  . Hx Left breast cancer, UOQ, IDC, receptor negative 11/08/1993    Rayetta Humphrey, PT CLT [phone] 08/30/2015, 1:31 PM  [facility] [address] [place], [place], [address] Phone: [phone]   Fax:  [phone]  Name: TIFFINY WORTHY MRN: [id number] Date of Birth: [birth year]

## 2015-09-01 ENCOUNTER — Ambulatory Visit (HOSPITAL_COMMUNITY): Payer: PPO | Admitting: Physical Therapy

## 2015-09-01 DIAGNOSIS — I972 Postmastectomy lymphedema syndrome: Secondary | ICD-10-CM | POA: Diagnosis not present

## 2015-09-01 NOTE — Therapy (Signed)
[facility], [place], [address] Phone: [phone]   Fax:  [phone]  Physical Therapy Treatment  Patient Details  Name: Amy Houston MRN: [id number] Date of Birth: [birth year] Referring Provider: Ancil Linsey  Encounter Date: 09/01/2015      PT End of Session - 09/01/15 1415    Visit Number 8   Number of Visits 18   Date for PT Re-Evaluation 08/19/15   Authorization Type [employer]   Authorization - Visit Number 8   Authorization - Number of Visits 10   PT Start Time 1304   PT Stop Time 4132   PT Time Calculation (min) 43 min   Activity Tolerance Patient tolerated treatment well   Behavior During Therapy [facility] for tasks assessed/performed      Past Medical History:  Diagnosis Date  . Abnormal genetic test 04/09/2013   PALB2 and CHEK2 positivity   . Arthritis    "of the spine" (05/08/2012)  . Breast cancer ([place]) 1995   "left" (05/08/2012)  . Breast cancer, right breast, IDC, Multifocal 03/12/2012   Multifocal, 10 and 8 left breast   . Diabetes mellitus 1999   "dx; never on meds" (05/08/2012)  . Exertional shortness of breath   . GERD (gastroesophageal reflux disease)    tums  . H/O hiatal hernia   . Hemorrhoid   . History of blood transfusion    post child birth  . HTN (hypertension)   . IBS (irritable bowel syndrome)   . Osteopenia 12/11/2012   On Ca++ and Vit D. Bone density in April 2014.  Next bone density due in April 2016.  Marland Kitchen PONV (postoperative nausea and vomiting)   . PPD positive, treated 1999  . Sleep apnea    "gone after I had OR" (05/08/2012)    Past Surgical History:  Procedure Laterality Date  . ABDOMINAL HYSTERECTOMY Bilateral 01/30/2013   Procedure: HYSTERECTOMY ABDOMINAL;  Surgeon: Melina Schools, MD;  Location: [facility];  Service: Gynecology;  Laterality: Bilateral;  . BREAST BIOPSY Left 1995  . BREAST BIOPSY Bilateral 03/2012   "one on the left; 2 on the right"  (05/08/2012)  . BREAST LUMPECTOMY Left 1995  . CHOLECYSTECTOMY  1992  . COLONOSCOPY  12/06/93   Dr. Hayes:single diminutive polyp of the descending colon/extrernal hemorrhoids, benign path  . COLONOSCOPY  11/30/98   Dr Hayes:small rectal polyp/internal hemorrhoids, benign path  . COLONOSCOPY  11/26/2006   Dr. Hayes:sigmoid polyp/small interal hemorrhoids, adenomatous  . COLONOSCOPY WITH ESOPHAGOGASTRODUODENOSCOPY (EGD) N/A 07/01/2012   Dr. Oneida Alar: 2 small adenomas, sigmoid colon diverticula, surveillance in 2019  . DILATION AND CURETTAGE OF UTERUS  1968  . ESOPHAGEAL DILATION  07/01/2012   Dr. Oneida Alar: proximal esophageal web, Schatzki's ring at GE junction, s/p dilaiton. Mild non-erosive gastritis. Repeat EGD July 23, 2012.   . ESOPHAGOGASTRODUODENOSCOPY  1994   Dr. Cristina Gong: small hiatal hernia and Schatzki's ring widely patent, CLO test negative  . ESOPHAGOGASTRODUODENOSCOPY (EGD) WITH ESOPHAGEAL DILATION N/A 07/23/2012   Dr. Fields:proximal esophageal web v. radiation induced stricture/schatzki ring/small HH, s/p dilation  . MASTECTOMY COMPLETE / SIMPLE Left 05/08/2012  . MASTECTOMY COMPLETE / SIMPLE W/ SENTINEL NODE BIOPSY Right 05/08/2012  . SALPINGOOPHORECTOMY Bilateral 01/30/2013   Procedure: SALPINGO OOPHORECTOMY;  Surgeon: Melina Schools, MD;  Location: [facility];  Service: Gynecology;  Laterality: Bilateral;  . SIMPLE MASTECTOMY WITH AXILLARY SENTINEL NODE BIOPSY Right 05/08/2012   Procedure: RIGHT TOTAL MASTECTOMY WITH AXILLARY SENTINEL NODE BIOPSY;  Surgeon: Haywood Lasso, MD;  Location: [facility];  Service: General;  Laterality: Right;  . SIMPLE MASTECTOMY WITH AXILLARY SENTINEL NODE BIOPSY Left 05/08/2012   Procedure: LEFT TOTAL MASTECTOMY;  Surgeon: Haywood Lasso, MD;  Location: [facility];  Service: General;  Laterality: Left;  . TONSILLECTOMY AND ADENOIDECTOMY  ~ 2008  . TUBAL LIGATION  1976  . UVULOPALATOPHARYNGOPLASTY, TONSILLECTOMY AND SEPTOPLASTY  ~ 2008    There were no vitals  filed for this visit.      Subjective Assessment - 09/01/15 1414    Subjective Pt states that she has used her pump two nights in a row but has not noticed much differeince.     Pertinent History Rt breast cancer with 4 sentinel node removal (-); diagnosed with Lt preast cancer in 1996 with chemo and radiation;Bilateral masectomy 05/08/2012 Bilateral salpingo-oophorectomy.   Currently in Pain? No/denies               LYMPHEDEMA/ONCOLOGY QUESTIONNAIRE - 09/01/15 1305      Left Upper Extremity Lymphedema   At Axilla  35.8 cm   15 cm Proximal to Olecranon Process 35.7 cm   10 cm Proximal to Olecranon Process 35.3 cm   Olecranon Process 31 cm   15 cm Proximal to Ulnar Styloid Process 26.8 cm   10 cm Proximal to Ulnar Styloid Process 23 cm   Just Proximal to Ulnar Styloid Process 17.6 cm   Across Hand at [employer] 19.2 cm   At [place] of 2nd Digit 7.2 cm   At [facility] of Thumb 7.1 cm                  [facility] Adult PT Treatment/Exercise - 09/01/15 0001      Manual Therapy   Manual Therapy Manual Lymphatic Drainage (MLD)   Manual Lymphatic Drainage (MLD) Including supraclavicular, deep and superficial abdominal, Rt  axillary/inguinal anastomosis and Rt UE.  Manual done both anterior and posteriorly with posterior done in side lying position.                   PT Short Term Goals - 09/01/15 1418      PT SHORT TERM GOAL #1   Title Pt volume in her Lt UE  to decrase by 2 cm to allow better fit of clothing    Time 3   Period Weeks   Status On-going     PT SHORT TERM GOAL #2   Title Pt to be able to verbalize the signs and sx of cellulitis and the need to contact a MD immediately    Time 3   Period Weeks   Status Achieved           PT Long Term Goals - 09/01/15 1418      PT LONG TERM GOAL #1   Title Pt volume to be decreased by 2.5 cm  in pt Lt UE for pt to be ready for a compression garment.    Time 6   Period Weeks   Status On-going     PT LONG  TERM GOAL #2   Title Pt to have obtained and to be able to don and doff compression garment to prevent increased edema of her Lt UE    Time 6   Period Weeks   Status On-going               Plan - 09/01/15 1415    Clinical Impression Statement Pt measured with minimal improvement.  Therapist and pt spoke again about compression bandages.  Pt is agreeable to try compression bandages to see if we can reduce her edema .   Rehab Potential Good   PT Frequency 3x / week   PT Duration 6 weeks   PT Treatment/Interventions Manual lymph drainage;Compression bandaging;Therapeutic exercise   PT Next Visit Plan cut foam for compression bandaging.  Begin compression bandaging if pt has purchased compression wraps.    Consulted and Agree with Plan of Care Patient      Patient will benefit from skilled therapeutic intervention in order to improve the following deficits and impairments:  Increased edema  Visit Diagnosis: Postmastectomy lymphedema     Problem List Patient Active Problem List   Diagnosis Date Noted  . Diverticulitis of colon 03/08/2015  . LLQ abdominal pain 01/22/2015  . Genetic testing 09/10/2014  . Acquired trigger finger 05/06/2013  . Abnormal genetic test 04/09/2013  . Osteopenia 12/11/2012  . Thyroid nodule 06/21/2012  . GERD (gastroesophageal reflux disease) 06/03/2012  . Adenomatous polyps 06/03/2012  . Breast cancer, right breast, IDC, Multifocal 03/12/2012  . Popliteal cyst 08/18/2010  . Hx Left breast cancer, UOQ, IDC, receptor negative 11/08/1993   Rayetta Humphrey, PT CLT [phone] 09/01/2015, 2:19 PM  [facility] [address] [place], [place], [address] Phone: [phone]   Fax:  [phone]  Name: Amy Houston MRN: [id number] Date of Birth: 06-Sep-1945

## 2015-09-03 ENCOUNTER — Ambulatory Visit (HOSPITAL_COMMUNITY): Payer: PPO | Attending: Hematology & Oncology | Admitting: Physical Therapy

## 2015-09-03 DIAGNOSIS — I972 Postmastectomy lymphedema syndrome: Secondary | ICD-10-CM | POA: Diagnosis not present

## 2015-09-03 NOTE — Therapy (Signed)
[place], [place], [address] Phone: [phone]   Fax:  [phone]  Physical Therapy Treatment  Patient Details  Name: Amy Houston MRN: [id number] Date of Birth: [birth year] Referring Provider: Ancil Linsey  Encounter Date: 09/03/2015      PT End of Session - 09/03/15 1314    Visit Number 9   Number of Visits 18   Date for PT Re-Evaluation 08/19/15   Authorization Type [employer]   Authorization - Visit Number 9   Authorization - Number of Visits 10   PT Start Time 1030   PT Stop Time 1115   PT Time Calculation (min) 45 min   Activity Tolerance Patient tolerated treatment well   Behavior During Therapy [facility] for tasks assessed/performed      Past Medical History:  Diagnosis Date  . Abnormal genetic test 04/09/2013   PALB2 and CHEK2 positivity   . Arthritis    "of the spine" (05/08/2012)  . Breast cancer ([place]) 1995   "left" (05/08/2012)  . Breast cancer, right breast, IDC, Multifocal 03/12/2012   Multifocal, 10 and 8 left breast   . Diabetes mellitus 1999   "dx; never on meds" (05/08/2012)  . Exertional shortness of breath   . GERD (gastroesophageal reflux disease)    tums  . H/O hiatal hernia   . Hemorrhoid   . History of blood transfusion    post child birth  . HTN (hypertension)   . IBS (irritable bowel syndrome)   . Osteopenia 12/11/2012   On Ca++ and Vit D. Bone density in April 2014.  Next bone density due in April 2016.  Marland Kitchen PONV (postoperative nausea and vomiting)   . PPD positive, treated 1999  . Sleep apnea    "gone after I had OR" (05/08/2012)    Past Surgical History:  Procedure Laterality Date  . ABDOMINAL HYSTERECTOMY Bilateral 01/30/2013   Procedure: HYSTERECTOMY ABDOMINAL;  Surgeon: Melina Schools, MD;  Location: [facility];  Service: Gynecology;  Laterality: Bilateral;  . BREAST BIOPSY Left 1995  . BREAST BIOPSY Bilateral 03/2012   "one on the left; 2 on the right"  (05/08/2012)  . BREAST LUMPECTOMY Left 1995  . CHOLECYSTECTOMY  1992  . COLONOSCOPY  12/06/93   Dr. Hayes:single diminutive polyp of the descending colon/extrernal hemorrhoids, benign path  . COLONOSCOPY  11/30/98   Dr Hayes:small rectal polyp/internal hemorrhoids, benign path  . COLONOSCOPY  11/26/2006   Dr. Hayes:sigmoid polyp/small interal hemorrhoids, adenomatous  . COLONOSCOPY WITH ESOPHAGOGASTRODUODENOSCOPY (EGD) N/A 07/01/2012   Dr. Oneida Alar: 2 small adenomas, sigmoid colon diverticula, surveillance in 2019  . DILATION AND CURETTAGE OF UTERUS  1968  . ESOPHAGEAL DILATION  07/01/2012   Dr. Oneida Alar: proximal esophageal web, Schatzki's ring at GE junction, s/p dilaiton. Mild non-erosive gastritis. Repeat EGD July 23, 2012.   . ESOPHAGOGASTRODUODENOSCOPY  1994   Dr. Cristina Gong: small hiatal hernia and Schatzki's ring widely patent, CLO test negative  . ESOPHAGOGASTRODUODENOSCOPY (EGD) WITH ESOPHAGEAL DILATION N/A 07/23/2012   Dr. Fields:proximal esophageal web v. radiation induced stricture/schatzki ring/small HH, s/p dilation  . MASTECTOMY COMPLETE / SIMPLE Left 05/08/2012  . MASTECTOMY COMPLETE / SIMPLE W/ SENTINEL NODE BIOPSY Right 05/08/2012  . SALPINGOOPHORECTOMY Bilateral 01/30/2013   Procedure: SALPINGO OOPHORECTOMY;  Surgeon: Melina Schools, MD;  Location: [facility];  Service: Gynecology;  Laterality: Bilateral;  . SIMPLE MASTECTOMY WITH AXILLARY SENTINEL NODE BIOPSY Right 05/08/2012   Procedure: RIGHT TOTAL MASTECTOMY WITH AXILLARY SENTINEL NODE BIOPSY;  Surgeon: Haywood Lasso, MD;  Location: [place];  Service: General;  Laterality: Right;  . SIMPLE MASTECTOMY WITH AXILLARY SENTINEL NODE BIOPSY Left 05/08/2012   Procedure: LEFT TOTAL MASTECTOMY;  Surgeon: Haywood Lasso, MD;  Location: [place];  Service: General;  Laterality: Left;  . TONSILLECTOMY AND ADENOIDECTOMY  ~ 2008  . TUBAL LIGATION  1976  . UVULOPALATOPHARYNGOPLASTY, TONSILLECTOMY AND SEPTOPLASTY  ~ 2008    There were no vitals  filed for this visit.      Subjective Assessment - 09/03/15 1312    Subjective Pt has now used her pump four nights in a row and is beginning to be able  to tell a difference.    Pertinent History Rt breast cancer with 4 sentinel node removal (-); diagnosed with Lt preast cancer in 1996 with chemo and radiation;Bilateral masectomy 05/08/2012 Bilateral salpingo-oophorectomy.   Currently in Pain? No/denies                         [facility] Adult PT Treatment/Exercise - 09/03/15 0001      Manual Therapy   Manual Therapy Manual Lymphatic Drainage (MLD)   Manual therapy comments cut foam for left upper exteremity in preparation for compression bandaging, (foam is in drawer in [facility]'s evaluation room)   Manual Lymphatic Drainage (MLD) Including supraclavicular, deep and superficial abdominal, Rt  axillary/inguinal anastomosis and Rt UE.  Manual done both anterior and posteriorly with posterior done in side lying position.                   PT Short Term Goals - 09/01/15 1418      PT SHORT TERM GOAL #1   Title Pt volume in her Lt UE  to decrase by 2 cm to allow better fit of clothing    Time 3   Period Weeks   Status On-going     PT SHORT TERM GOAL #2   Title Pt to be able to verbalize the signs and sx of cellulitis and the need to contact a MD immediately    Time 3   Period Weeks   Status Achieved           PT Long Term Goals - 09/01/15 1418      PT LONG TERM GOAL #1   Title Pt volume to be decreased by 2.5 cm  in pt Lt UE for pt to be ready for a compression garment.    Time 6   Period Weeks   Status On-going     PT LONG TERM GOAL #2   Title Pt to have obtained and to be able to don and doff compression garment to prevent increased edema of her Lt UE    Time 6   Period Weeks   Status On-going               Plan - 09/03/15 1315    Clinical Impression Statement Pt plans on purchasing short stretch bandages this weekend.  Foam cut in  anticipation of pt obtaining bandages for next treatment.  Pt noted decreased swelling in wrist area as watch that she wears now slides around on her wrist.     Rehab Potential Good   PT Frequency 3x / week   PT Duration 6 weeks   PT Treatment/Interventions Manual lymph drainage;Compression bandaging;Therapeutic exercise   PT Next Visit Plan Measure volume for g-code.   Begin compression bandaging if pt has purchased compression wraps.  Consulted and Agree with Plan of Care Patient      Patient will benefit from skilled therapeutic intervention in order to improve the following deficits and impairments:  Increased edema  Visit Diagnosis: Postmastectomy lymphedema     Problem List Patient Active Problem List   Diagnosis Date Noted  . Diverticulitis of colon 03/08/2015  . LLQ abdominal pain 01/22/2015  . Genetic testing 09/10/2014  . Acquired trigger finger 05/06/2013  . Abnormal genetic test 04/09/2013  . Osteopenia 12/11/2012  . Thyroid nodule 06/21/2012  . GERD (gastroesophageal reflux disease) 06/03/2012  . Adenomatous polyps 06/03/2012  . Breast cancer, right breast, IDC, Multifocal 03/12/2012  . Popliteal cyst 08/18/2010  . Hx Left breast cancer, UOQ, IDC, receptor negative 11/08/1993   Rayetta Humphrey, PT CLT [phone] 09/03/2015, 1:18 PM  [place] [address] [place], [place], [address] Phone: [phone]   Fax:  [phone]  Name: SUNSET JOSHI MRN: [id number] Date of Birth: Nov 10, 1945

## 2015-09-08 ENCOUNTER — Ambulatory Visit (HOSPITAL_COMMUNITY): Payer: PPO | Admitting: Physical Therapy

## 2015-09-08 ENCOUNTER — Ambulatory Visit: Payer: PPO | Admitting: Gastroenterology

## 2015-09-08 DIAGNOSIS — I972 Postmastectomy lymphedema syndrome: Secondary | ICD-10-CM

## 2015-09-08 NOTE — Therapy (Signed)
[place], [place], [address] Phone: [phone]   Fax:  [phone]  Physical Therapy Treatment  Patient Details  Name: Amy Houston MRN: [id number] Date of Birth: 12/12/1945 Referring Provider: Ancil Linsey  Encounter Date: 09/08/2015      PT End of Session - 09/08/15 1609    Visit Number 10   Number of Visits 18   Date for PT Re-Evaluation 08/19/15   Authorization Type [employer]   Authorization - Visit Number 10   Authorization - Number of Visits 20   PT Start Time 1350   PT Stop Time 1430   PT Time Calculation (min) 40 min   Activity Tolerance Patient tolerated treatment well   Behavior During Therapy [facility] for tasks assessed/performed      Past Medical History:  Diagnosis Date  . Abnormal genetic test 04/09/2013   PALB2 and CHEK2 positivity   . Arthritis    "of the spine" (05/08/2012)  . Breast cancer ([place]) 1995   "left" (05/08/2012)  . Breast cancer, right breast, IDC, Multifocal 03/12/2012   Multifocal, 10 and 8 left breast   . Diabetes mellitus 1999   "dx; never on meds" (05/08/2012)  . Exertional shortness of breath   . GERD (gastroesophageal reflux disease)    tums  . H/O hiatal hernia   . Hemorrhoid   . History of blood transfusion    post child birth  . HTN (hypertension)   . IBS (irritable bowel syndrome)   . Osteopenia 12/11/2012   On Ca++ and Vit D. Bone density in April 2014.  Next bone density due in April 2016.  Marland Kitchen PONV (postoperative nausea and vomiting)   . PPD positive, treated 1999  . Sleep apnea    "gone after I had OR" (05/08/2012)    Past Surgical History:  Procedure Laterality Date  . ABDOMINAL HYSTERECTOMY Bilateral 01/30/2013   Procedure: HYSTERECTOMY ABDOMINAL;  Surgeon: Melina Schools, MD;  Location: [facility];  Service: Gynecology;  Laterality: Bilateral;  . BREAST BIOPSY Left 1995  . BREAST BIOPSY Bilateral 03/2012   "one on the left; 2 on the right"  (05/08/2012)  . BREAST LUMPECTOMY Left 1995  . CHOLECYSTECTOMY  1992  . COLONOSCOPY  12/06/93   Dr. Hayes:single diminutive polyp of the descending colon/extrernal hemorrhoids, benign path  . COLONOSCOPY  11/30/98   Dr Hayes:small rectal polyp/internal hemorrhoids, benign path  . COLONOSCOPY  11/26/2006   Dr. Hayes:sigmoid polyp/small interal hemorrhoids, adenomatous  . COLONOSCOPY WITH ESOPHAGOGASTRODUODENOSCOPY (EGD) N/A 07/01/2012   Dr. Oneida Alar: 2 small adenomas, sigmoid colon diverticula, surveillance in 2019  . DILATION AND CURETTAGE OF UTERUS  1968  . ESOPHAGEAL DILATION  07/01/2012   Dr. Oneida Alar: proximal esophageal web, Schatzki's ring at GE junction, s/p dilaiton. Mild non-erosive gastritis. Repeat EGD July 23, 2012.   . ESOPHAGOGASTRODUODENOSCOPY  1994   Dr. Cristina Gong: small hiatal hernia and Schatzki's ring widely patent, CLO test negative  . ESOPHAGOGASTRODUODENOSCOPY (EGD) WITH ESOPHAGEAL DILATION N/A 07/23/2012   Dr. Fields:proximal esophageal web v. radiation induced stricture/schatzki ring/small HH, s/p dilation  . MASTECTOMY COMPLETE / SIMPLE Left 05/08/2012  . MASTECTOMY COMPLETE / SIMPLE W/ SENTINEL NODE BIOPSY Right 05/08/2012  . SALPINGOOPHORECTOMY Bilateral 01/30/2013   Procedure: SALPINGO OOPHORECTOMY;  Surgeon: Melina Schools, MD;  Location: [facility];  Service: Gynecology;  Laterality: Bilateral;  . SIMPLE MASTECTOMY WITH AXILLARY SENTINEL NODE BIOPSY Right 05/08/2012   Procedure: RIGHT TOTAL MASTECTOMY WITH AXILLARY SENTINEL NODE BIOPSY;  Surgeon: Haywood Lasso, MD;  Location: [place];  Service: General;  Laterality: Right;  . SIMPLE MASTECTOMY WITH AXILLARY SENTINEL NODE BIOPSY Left 05/08/2012   Procedure: LEFT TOTAL MASTECTOMY;  Surgeon: Haywood Lasso, MD;  Location: [facility];  Service: General;  Laterality: Left;  . TONSILLECTOMY AND ADENOIDECTOMY  ~ 2008  . TUBAL LIGATION  1976  . UVULOPALATOPHARYNGOPLASTY, TONSILLECTOMY AND SEPTOPLASTY  ~ 2008    There were no vitals  filed for this visit.      Subjective Assessment - 09/08/15 1607    Subjective Pt states she continues to use her pump daily.  Still has not received her compression bandages but expected to receive them this weekend.  currently without pain.   Currently in Pain? No/denies               LYMPHEDEMA/ONCOLOGY QUESTIONNAIRE - 09/08/15 1357      Left Upper Extremity Lymphedema   At Axilla  35.6 cm  Was 35.6   15 cm Proximal to Olecranon Process 35.2 cm           35.7   10 cm Proximal to Olecranon Process 34.8 cm           35.3   Olecranon Process 30.5 cm           31.0   15 cm Proximal to Ulnar Styloid Process 29.3 cm           26.8    10 cm Proximal to Ulnar Styloid Process 24.9 cm           23.0   Just Proximal to Ulnar Styloid Process 18 cm                 Across Hand at [employer] 19.8 cm           19.2   At [place] of 2nd Digit 6 cm                   7.2   At Base of Thumb 7 cm                   7.1                  [facility] Adult PT Treatment/Exercise - 09/08/15 0001      Manual Therapy   Manual Therapy Manual Lymphatic Drainage (MLD)   Manual therapy comments measurement of Lt UE   Manual Lymphatic Drainage (MLD) Including supraclavicular, deep and superficial abdominal, Rt  axillary/inguinal anastomosis and Rt UE.  Manual done both anterior and posteriorly with posterior done in side lying position.                   PT Short Term Goals - 09/01/15 1418      PT SHORT TERM GOAL #1   Title Pt volume in her Lt UE  to decrase by 2 cm to allow better fit of clothing    Time 3   Period Weeks   Status On-going     PT SHORT TERM GOAL #2   Title Pt to be able to verbalize the signs and sx of cellulitis and the need to contact a MD immediately    Time 3   Period Weeks   Status Achieved           PT Long Term Goals - 09/01/15 1418      PT LONG TERM GOAL #1   Title Pt volume  to be decreased by 2.5 cm  in pt Lt UE for pt to be ready for a  compression garment.    Time 6   Period Weeks   Status On-going     PT LONG TERM GOAL #2   Title Pt to have obtained and to be able to don and doff compression garment to prevent increased edema of her Lt UE    Time 6   Period Weeks   Status On-going               Plan - 09/08/15 1610    Clinical Impression Statement Pt has completed 10 treatments focusing on lymphedema education and manual lymph drainge.  PT is now independent with home massage using her intermittent sequential pump and has ordered short stretch bandages to begin to further decompress Lt UE.  Measurements taken today show distal LE is slightly elevated from last measurements taken 2 weeks prior, however is overall reduced as when began therapy.  Pt would benefit from further therapy to continue decompression and transition into garment.     Rehab Potential Good   PT Frequency 3x / week   PT Duration 6 weeks   PT Treatment/Interventions Manual lymph drainage;Compression bandaging;Therapeutic exercise   PT Next Visit Plan continue manual lymph drainage techniques and begin compression bandaging when receives.    Consulted and Agree with Plan of Care Patient      Patient will benefit from skilled therapeutic intervention in order to improve the following deficits and impairments:  Increased edema  Visit Diagnosis: Postmastectomy lymphedema   [id number]  CJ [id number]  CI  Problem List Patient Active Problem List   Diagnosis Date Noted  . Diverticulitis of colon 03/08/2015  . LLQ abdominal pain 01/22/2015  . Genetic testing 09/10/2014  . Acquired trigger finger 05/06/2013  . Abnormal genetic test 04/09/2013  . Osteopenia 12/11/2012  . Thyroid nodule 06/21/2012  . GERD (gastroesophageal reflux disease) 06/03/2012  . Adenomatous polyps 06/03/2012  . Breast cancer, right breast, IDC, Multifocal 03/12/2012  . Popliteal cyst 08/18/2010  . Hx Left breast cancer, UOQ, IDC, receptor negative 11/08/1993    Teena Irani, PTA/CLT [phone]  09/08/2015, 4:18 PM Rayetta Humphrey, PT CLT [place] [address] [place], [place], [address] Phone: [phone]   Fax:  [phone]  Name: Amy Houston MRN: [id number] Date of Birth: [birth year]

## 2015-09-10 ENCOUNTER — Ambulatory Visit (HOSPITAL_COMMUNITY): Payer: PPO | Admitting: Physical Therapy

## 2015-09-10 DIAGNOSIS — I972 Postmastectomy lymphedema syndrome: Secondary | ICD-10-CM

## 2015-09-10 NOTE — Therapy (Signed)
[place], [place], [address] Phone: [phone]   Fax:  [phone]  Physical Therapy Treatment  Patient Details  Name: Amy Houston MRN: [id number] Date of Birth: August 26, 1945 Referring Provider: Ancil Linsey  Encounter Date: 09/10/2015      PT End of Session - 09/10/15 1511    Visit Number 11   Number of Visits 18   Date for PT Re-Evaluation 08/19/15   Authorization Type [employer]   Authorization - Visit Number 11   Authorization - Number of Visits 20   PT Start Time 3338   PT Stop Time 1352   PT Time Calculation (min) 47 min   Activity Tolerance Patient tolerated treatment well   Behavior During Therapy [facility] for tasks assessed/performed      Past Medical History:  Diagnosis Date  . Abnormal genetic test 04/09/2013   PALB2 and CHEK2 positivity   . Arthritis    "of the spine" (05/08/2012)  . Breast cancer ([place]) 1995   "left" (05/08/2012)  . Breast cancer, right breast, IDC, Multifocal 03/12/2012   Multifocal, 10 and 8 left breast   . Diabetes mellitus 1999   "dx; never on meds" (05/08/2012)  . Exertional shortness of breath   . GERD (gastroesophageal reflux disease)    tums  . H/O hiatal hernia   . Hemorrhoid   . History of blood transfusion    post child birth  . HTN (hypertension)   . IBS (irritable bowel syndrome)   . Osteopenia 12/11/2012   On Ca++ and Vit D. Bone density in April 2014.  Next bone density due in April 2016.  Marland Kitchen PONV (postoperative nausea and vomiting)   . PPD positive, treated 1999  . Sleep apnea    "gone after I had OR" (05/08/2012)    Past Surgical History:  Procedure Laterality Date  . ABDOMINAL HYSTERECTOMY Bilateral 01/30/2013   Procedure: HYSTERECTOMY ABDOMINAL;  Surgeon: Melina Schools, MD;  Location: [facility];  Service: Gynecology;  Laterality: Bilateral;  . BREAST BIOPSY Left 1995  . BREAST BIOPSY Bilateral 03/2012   "one on the left; 2 on the right"  (05/08/2012)  . BREAST LUMPECTOMY Left 1995  . CHOLECYSTECTOMY  1992  . COLONOSCOPY  12/06/93   Dr. Hayes:single diminutive polyp of the descending colon/extrernal hemorrhoids, benign path  . COLONOSCOPY  11/30/98   Dr Hayes:small rectal polyp/internal hemorrhoids, benign path  . COLONOSCOPY  11/26/2006   Dr. Hayes:sigmoid polyp/small interal hemorrhoids, adenomatous  . COLONOSCOPY WITH ESOPHAGOGASTRODUODENOSCOPY (EGD) N/A 07/01/2012   Dr. Oneida Alar: 2 small adenomas, sigmoid colon diverticula, surveillance in 2019  . DILATION AND CURETTAGE OF UTERUS  1968  . ESOPHAGEAL DILATION  07/01/2012   Dr. Oneida Alar: proximal esophageal web, Schatzki's ring at GE junction, s/p dilaiton. Mild non-erosive gastritis. Repeat EGD July 23, 2012.   . ESOPHAGOGASTRODUODENOSCOPY  1994   Dr. Cristina Gong: small hiatal hernia and Schatzki's ring widely patent, CLO test negative  . ESOPHAGOGASTRODUODENOSCOPY (EGD) WITH ESOPHAGEAL DILATION N/A 07/23/2012   Dr. Fields:proximal esophageal web v. radiation induced stricture/schatzki ring/small HH, s/p dilation  . MASTECTOMY COMPLETE / SIMPLE Left 05/08/2012  . MASTECTOMY COMPLETE / SIMPLE W/ SENTINEL NODE BIOPSY Right 05/08/2012  . SALPINGOOPHORECTOMY Bilateral 01/30/2013   Procedure: SALPINGO OOPHORECTOMY;  Surgeon: Melina Schools, MD;  Location: [facility];  Service: Gynecology;  Laterality: Bilateral;  . SIMPLE MASTECTOMY WITH AXILLARY SENTINEL NODE BIOPSY Right 05/08/2012   Procedure: RIGHT TOTAL MASTECTOMY WITH AXILLARY SENTINEL NODE BIOPSY;  Surgeon: Haywood Lasso, MD;  Location: [facility];  Service: General;  Laterality: Right;  . SIMPLE MASTECTOMY WITH AXILLARY SENTINEL NODE BIOPSY Left 05/08/2012   Procedure: LEFT TOTAL MASTECTOMY;  Surgeon: Haywood Lasso, MD;  Location: [facility];  Service: General;  Laterality: Left;  . TONSILLECTOMY AND ADENOIDECTOMY  ~ 2008  . TUBAL LIGATION  1976  . UVULOPALATOPHARYNGOPLASTY, TONSILLECTOMY AND SEPTOPLASTY  ~ 2008    There were no vitals  filed for this visit.      Subjective Assessment - 09/10/15 1507    Subjective Pt comes to the department with her compression bandages.     Pertinent History breast cancer    Currently in Pain? No/denies                         [facility] Adult PT Treatment/Exercise - 09/10/15 0001      Manual Therapy   Manual Therapy Manual Lymphatic Drainage (MLD);Compression Bandaging   Manual Lymphatic Drainage (MLD) Including supraclavicular, deep and superficial abdominal, Rt  axillary/inguinal anastomosis and Rt UE.  Manual done  anterior.   Manual followed by compression bandaging.    Compression Bandaging Compression bandage with multilayer short stretch bandages and foam; including fingers.                 PT Education - 09/10/15 1509    Education provided Yes   Education Details Bandages should never cause pain and if they do take bandages off.  Pt given "How to care for short stretch bandages" sheet.    Person(s) Educated Patient   Methods Explanation   Comprehension Verbalized understanding          PT Short Term Goals - 09/10/15 1515      PT SHORT TERM GOAL #1   Title Pt volume in her Lt UE  to decrase by 2 cm to allow better fit of clothing    Time 3   Period Weeks   Status On-going     PT SHORT TERM GOAL #2   Title Pt to be able to verbalize the signs and sx of cellulitis and the need to contact a MD immediately    Time 3   Period Weeks   Status Achieved           PT Long Term Goals - 09/10/15 1515      PT LONG TERM GOAL #1   Title Pt volume to be decreased by 2.5 cm  in pt Lt UE for pt to be ready for a compression garment.    Time 6   Period Weeks   Status On-going     PT LONG TERM GOAL #2   Title Pt to have obtained and to be able to don and doff compression garment to prevent increased edema of her Lt UE    Time 6   Period Weeks   Status On-going               Plan - 09/10/15 1512    Clinical Impression Statement Pt  comes to department with compression bandages.  Pt had wedding rings on that were extremely tight; unable to get one off therefore bandaged over ring with the hopes thats swelling will decrease and pt will be able to doff band at next visit.  Pt was urged not to place anything tight on Lt UE as this will increase swelling.     Rehab Potential Good   PT Frequency 3x /  week   PT Duration 6 weeks   PT Treatment/Interventions Manual lymph drainage;Compression bandaging;Therapeutic exercise   PT Next Visit Plan continue manual lymph drainage techniques and compression bandaging.  Assess comfort of bandages.    Consulted and Agree with Plan of Care Patient      Patient will benefit from skilled therapeutic intervention in order to improve the following deficits and impairments:  Increased edema  Visit Diagnosis: Postmastectomy lymphedema     Problem List Patient Active Problem List   Diagnosis Date Noted  . Diverticulitis of colon 03/08/2015  . LLQ abdominal pain 01/22/2015  . Genetic testing 09/10/2014  . Acquired trigger finger 05/06/2013  . Abnormal genetic test 04/09/2013  . Osteopenia 12/11/2012  . Thyroid nodule 06/21/2012  . GERD (gastroesophageal reflux disease) 06/03/2012  . Adenomatous polyps 06/03/2012  . Breast cancer, right breast, IDC, Multifocal 03/12/2012  . Popliteal cyst 08/18/2010  . Hx Left breast cancer, UOQ, IDC, receptor negative 11/08/1993   Rayetta Humphrey, PT CLT [phone] 09/10/2015, 3:16 PM  [place] [address] [place], [place], [address] Phone: [phone]   Fax:  [phone]  Name: JATAYA WANN MRN: [id number] Date of Birth: 01/23/1945

## 2015-09-13 ENCOUNTER — Ambulatory Visit (HOSPITAL_COMMUNITY): Payer: PPO | Admitting: Physical Therapy

## 2015-09-13 DIAGNOSIS — I972 Postmastectomy lymphedema syndrome: Secondary | ICD-10-CM | POA: Diagnosis not present

## 2015-09-13 NOTE — Therapy (Signed)
[place], [place], [address] Phone: [phone]   Fax:  [phone]  Physical Therapy Treatment  Patient Details  Name: Amy Houston MRN: [id number] Date of Birth: May 19, 1945 Referring Provider: Ancil Linsey  Encounter Date: 09/13/2015      PT End of Session - 09/13/15 1409    Visit Number 12   Number of Visits 18   Date for PT Re-Evaluation 08/19/15   Authorization Type [employer]   Authorization - Visit Number 12   Authorization - Number of Visits 20   PT Start Time 1300   PT Stop Time 1350   PT Time Calculation (min) 50 min   Activity Tolerance Patient tolerated treatment well   Behavior During Therapy [facility] for tasks assessed/performed      Past Medical History:  Diagnosis Date  . Abnormal genetic test 04/09/2013   PALB2 and CHEK2 positivity   . Arthritis    "of the spine" (05/08/2012)  . Breast cancer ([place]) 1995   "left" (05/08/2012)  . Breast cancer, right breast, IDC, Multifocal 03/12/2012   Multifocal, 10 and 8 left breast   . Diabetes mellitus 1999   "dx; never on meds" (05/08/2012)  . Exertional shortness of breath   . GERD (gastroesophageal reflux disease)    tums  . H/O hiatal hernia   . Hemorrhoid   . History of blood transfusion    post child birth  . HTN (hypertension)   . IBS (irritable bowel syndrome)   . Osteopenia 12/11/2012   On Ca++ and Vit D. Bone density in April 2014.  Next bone density due in April 2016.  Marland Kitchen PONV (postoperative nausea and vomiting)   . PPD positive, treated 1999  . Sleep apnea    "gone after I had OR" (05/08/2012)    Past Surgical History:  Procedure Laterality Date  . ABDOMINAL HYSTERECTOMY Bilateral 01/30/2013   Procedure: HYSTERECTOMY ABDOMINAL;  Surgeon: Melina Schools, MD;  Location: [facility];  Service: Gynecology;  Laterality: Bilateral;  . BREAST BIOPSY Left 1995  . BREAST BIOPSY Bilateral 03/2012   "one on the left; 2 on the right"  (05/08/2012)  . BREAST LUMPECTOMY Left 1995  . CHOLECYSTECTOMY  1992  . COLONOSCOPY  12/06/93   Dr. Hayes:single diminutive polyp of the descending colon/extrernal hemorrhoids, benign path  . COLONOSCOPY  11/30/98   Dr Hayes:small rectal polyp/internal hemorrhoids, benign path  . COLONOSCOPY  11/26/2006   Dr. Hayes:sigmoid polyp/small interal hemorrhoids, adenomatous  . COLONOSCOPY WITH ESOPHAGOGASTRODUODENOSCOPY (EGD) N/A 07/01/2012   Dr. Oneida Alar: 2 small adenomas, sigmoid colon diverticula, surveillance in 2019  . DILATION AND CURETTAGE OF UTERUS  1968  . ESOPHAGEAL DILATION  07/01/2012   Dr. Oneida Alar: proximal esophageal web, Schatzki's ring at GE junction, s/p dilaiton. Mild non-erosive gastritis. Repeat EGD July 23, 2012.   . ESOPHAGOGASTRODUODENOSCOPY  1994   Dr. Cristina Gong: small hiatal hernia and Schatzki's ring widely patent, CLO test negative  . ESOPHAGOGASTRODUODENOSCOPY (EGD) WITH ESOPHAGEAL DILATION N/A 07/23/2012   Dr. Fields:proximal esophageal web v. radiation induced stricture/schatzki ring/small HH, s/p dilation  . MASTECTOMY COMPLETE / SIMPLE Left 05/08/2012  . MASTECTOMY COMPLETE / SIMPLE W/ SENTINEL NODE BIOPSY Right 05/08/2012  . SALPINGOOPHORECTOMY Bilateral 01/30/2013   Procedure: SALPINGO OOPHORECTOMY;  Surgeon: Melina Schools, MD;  Location: [facility];  Service: Gynecology;  Laterality: Bilateral;  . SIMPLE MASTECTOMY WITH AXILLARY SENTINEL NODE BIOPSY Right 05/08/2012   Procedure: RIGHT TOTAL MASTECTOMY WITH AXILLARY SENTINEL NODE BIOPSY;  Surgeon: Haywood Lasso, MD;  Location: [facility];  Service: General;  Laterality: Right;  . SIMPLE MASTECTOMY WITH AXILLARY SENTINEL NODE BIOPSY Left 05/08/2012   Procedure: LEFT TOTAL MASTECTOMY;  Surgeon: Haywood Lasso, MD;  Location: [facility];  Service: General;  Laterality: Left;  . TONSILLECTOMY AND ADENOIDECTOMY  ~ 2008  . TUBAL LIGATION  1976  . UVULOPALATOPHARYNGOPLASTY, TONSILLECTOMY AND SEPTOPLASTY  ~ 2008    There were no vitals  filed for this visit.      Subjective Assessment - 09/13/15 1406    Subjective Pt states that she kept the compression bandages on but they were itchy.     Pertinent History breast cancer    Currently in Pain? No/denies                         [facility] Adult PT Treatment/Exercise - 09/13/15 0001      Manual Therapy   Manual Therapy Manual Lymphatic Drainage (MLD);Compression Bandaging   Manual Lymphatic Drainage (MLD) Including supraclavicular, deep and superficial abdominal, Rt  axillary/inguinal anastomosis and Rt UE.  Manual done  anteriorly and posteriorly.  Posteriorly done sidelying.    Manual followed by compression bandaging.    Compression Bandaging Compression bandage with multilayer short stretch bandages and foam; including fingers.                 PT Education - 09/13/15 1408    Education provided Yes   Education Details bandages should not be painful.  Pt is to take one layer off at a time if she experiences any discomfort.    Person(s) Educated Patient   Methods Explanation   Comprehension Verbalized understanding          PT Short Term Goals - 09/10/15 1515      PT SHORT TERM GOAL #1   Title Pt volume in her Lt UE  to decrase by 2 cm to allow better fit of clothing    Time 3   Period Weeks   Status On-going     PT SHORT TERM GOAL #2   Title Pt to be able to verbalize the signs and sx of cellulitis and the need to contact a MD immediately    Time 3   Period Weeks   Status Achieved           PT Long Term Goals - 09/10/15 1515      PT LONG TERM GOAL #1   Title Pt volume to be decreased by 2.5 cm  in pt Lt UE for pt to be ready for a compression garment.    Time 6   Period Weeks   Status On-going     PT LONG TERM GOAL #2   Title Pt to have obtained and to be able to don and doff compression garment to prevent increased edema of her Lt UE    Time 6   Period Weeks   Status On-going               Plan - 09/13/15  1409    Clinical Impression Statement UE cleansed and moisturized prior to bandaging.  Attempted using a thicker sleeve so that no foam touches skin to ecraese itchiness.  Pt tolerating bandaging well.  Pt is still unable to get wedding band off ring finger,   Rehab Potential Good   PT Frequency 3x / week   PT Duration 6 weeks   PT Treatment/Interventions Manual lymph drainage;Compression bandaging;Therapeutic  exercise   PT Next Visit Plan Remeasure for volume; continue manual lymph drainage techniques and compression bandaging.     Consulted and Agree with Plan of Care Patient      Patient will benefit from skilled therapeutic intervention in order to improve the following deficits and impairments:  Increased edema  Visit Diagnosis: Postmastectomy lymphedema     Problem List Patient Active Problem List   Diagnosis Date Noted  . Diverticulitis of colon 03/08/2015  . LLQ abdominal pain 01/22/2015  . Genetic testing 09/10/2014  . Acquired trigger finger 05/06/2013  . Abnormal genetic test 04/09/2013  . Osteopenia 12/11/2012  . Thyroid nodule 06/21/2012  . GERD (gastroesophageal reflux disease) 06/03/2012  . Adenomatous polyps 06/03/2012  . Breast cancer, right breast, IDC, Multifocal 03/12/2012  . Popliteal cyst 08/18/2010  . Hx Left breast cancer, UOQ, IDC, receptor negative 11/08/1993    Rayetta Humphrey, PT CLT [phone] 09/13/2015, 2:14 PM  [facility] [address] [place], [place], [address] Phone: [phone]   Fax:  [phone]  Name: ADELYNNE JOERGER MRN: [id number] Date of Birth: 01-04-1945

## 2015-09-14 ENCOUNTER — Ambulatory Visit: Payer: PPO | Admitting: Gastroenterology

## 2015-09-15 ENCOUNTER — Ambulatory Visit (HOSPITAL_COMMUNITY): Payer: PPO | Admitting: Physical Therapy

## 2015-09-15 DIAGNOSIS — I972 Postmastectomy lymphedema syndrome: Secondary | ICD-10-CM | POA: Diagnosis not present

## 2015-09-15 NOTE — Therapy (Signed)
[place], [place], [address] Phone: [phone]   Fax:  [phone]  Physical Therapy Treatment  Patient Details  Name: Amy Houston MRN: [id number] Date of Birth: February 19, 1945 Referring Provider: Ancil Linsey  Encounter Date: 09/15/2015      PT End of Session - 09/15/15 1558    Visit Number 13   Number of Visits 18   Date for PT Re-Evaluation 08/19/15   Authorization Type [employer]   Authorization - Visit Number 13   Authorization - Number of Visits 20   PT Start Time 6294   PT Stop Time 1440   PT Time Calculation (min) 51 min   Activity Tolerance Patient tolerated treatment well   Behavior During Therapy [facility] for tasks assessed/performed      Past Medical History:  Diagnosis Date  . Abnormal genetic test 04/09/2013   PALB2 and CHEK2 positivity   . Arthritis    "of the spine" (05/08/2012)  . Breast cancer ([place]) 1995   "left" (05/08/2012)  . Breast cancer, right breast, IDC, Multifocal 03/12/2012   Multifocal, 10 and 8 left breast   . Diabetes mellitus 1999   "dx; never on meds" (05/08/2012)  . Exertional shortness of breath   . GERD (gastroesophageal reflux disease)    tums  . H/O hiatal hernia   . Hemorrhoid   . History of blood transfusion    post child birth  . HTN (hypertension)   . IBS (irritable bowel syndrome)   . Osteopenia 12/11/2012   On Ca++ and Vit D. Bone density in April 2014.  Next bone density due in April 2016.  Marland Kitchen PONV (postoperative nausea and vomiting)   . PPD positive, treated 1999  . Sleep apnea    "gone after I had OR" (05/08/2012)    Past Surgical History:  Procedure Laterality Date  . ABDOMINAL HYSTERECTOMY Bilateral 01/30/2013   Procedure: HYSTERECTOMY ABDOMINAL;  Surgeon: Melina Schools, MD;  Location: [facility];  Service: Gynecology;  Laterality: Bilateral;  . BREAST BIOPSY Left 1995  . BREAST BIOPSY Bilateral 03/2012   "one on the left; 2 on the right"  (05/08/2012)  . BREAST LUMPECTOMY Left 1995  . CHOLECYSTECTOMY  1992  . COLONOSCOPY  12/06/93   Dr. Hayes:single diminutive polyp of the descending colon/extrernal hemorrhoids, benign path  . COLONOSCOPY  11/30/98   Dr Hayes:small rectal polyp/internal hemorrhoids, benign path  . COLONOSCOPY  11/26/2006   Dr. Hayes:sigmoid polyp/small interal hemorrhoids, adenomatous  . COLONOSCOPY WITH ESOPHAGOGASTRODUODENOSCOPY (EGD) N/A 07/01/2012   Dr. Oneida Alar: 2 small adenomas, sigmoid colon diverticula, surveillance in 2019  . DILATION AND CURETTAGE OF UTERUS  1968  . ESOPHAGEAL DILATION  07/01/2012   Dr. Oneida Alar: proximal esophageal web, Schatzki's ring at GE junction, s/p dilaiton. Mild non-erosive gastritis. Repeat EGD July 23, 2012.   . ESOPHAGOGASTRODUODENOSCOPY  1994   Dr. Cristina Gong: small hiatal hernia and Schatzki's ring widely patent, CLO test negative  . ESOPHAGOGASTRODUODENOSCOPY (EGD) WITH ESOPHAGEAL DILATION N/A 07/23/2012   Dr. Fields:proximal esophageal web v. radiation induced stricture/schatzki ring/small HH, s/p dilation  . MASTECTOMY COMPLETE / SIMPLE Left 05/08/2012  . MASTECTOMY COMPLETE / SIMPLE W/ SENTINEL NODE BIOPSY Right 05/08/2012  . SALPINGOOPHORECTOMY Bilateral 01/30/2013   Procedure: SALPINGO OOPHORECTOMY;  Surgeon: Melina Schools, MD;  Location: [facility];  Service: Gynecology;  Laterality: Bilateral;  . SIMPLE MASTECTOMY WITH AXILLARY SENTINEL NODE BIOPSY Right 05/08/2012   Procedure: RIGHT TOTAL MASTECTOMY WITH AXILLARY SENTINEL NODE BIOPSY;  Surgeon: Haywood Lasso, MD;  Location: [facility];  Service: General;  Laterality: Right;  . SIMPLE MASTECTOMY WITH AXILLARY SENTINEL NODE BIOPSY Left 05/08/2012   Procedure: LEFT TOTAL MASTECTOMY;  Surgeon: Haywood Lasso, MD;  Location: [facility];  Service: General;  Laterality: Left;  . TONSILLECTOMY AND ADENOIDECTOMY  ~ 2008  . TUBAL LIGATION  1976  . UVULOPALATOPHARYNGOPLASTY, TONSILLECTOMY AND SEPTOPLASTY  ~ 2008    There were no vitals  filed for this visit.      Subjective Assessment - 09/15/15 1555    Subjective Pt states that she kept the compression bandages on but they were tight.     Pertinent History breast cancer    Currently in Pain? No/denies               LYMPHEDEMA/ONCOLOGY QUESTIONNAIRE - 09/15/15 1349      Left Upper Extremity Lymphedema   At Axilla  35.8 cm  35.6   15 cm Proximal to Olecranon Process 35 cm  35.2   10 cm Proximal to Olecranon Process 34.8 cm  34.8   Olecranon Process 30.5 cm  was 30.5   15 cm Proximal to Ulnar Styloid Process 27.7 cm  was 29.3   10 cm Proximal to Ulnar Styloid Process 23.8 cm  was 24.9   Just Proximal to Ulnar Styloid Process 17.9 cm  was 18   Across Hand at [employer] 19.8 cm  was 19.8   At [place] of 2nd Digit 7.2 cm  was 6   At [facility] of Thumb 7.2 cm  was 7                  [facility] Adult PT Treatment/Exercise - 09/15/15 0001      Manual Therapy   Manual Therapy Manual Lymphatic Drainage (MLD);Compression Bandaging   Manual Lymphatic Drainage (MLD) Including supraclavicular, deep and superficial abdominal, Rt  axillary/inguinal anastomosis and Rt UE.  Manual done  anteriorly and posteriorly.  Posteriorly done sidelying.    Manual followed by compression bandaging.    Compression Bandaging Compression bandage with multilayer short stretch bandages and foam; including fingers.                   PT Short Term Goals - 09/10/15 1515      PT SHORT TERM GOAL #1   Title Pt volume in her Lt UE  to decrase by 2 cm to allow better fit of clothing    Time 3   Period Weeks   Status On-going     PT SHORT TERM GOAL #2   Title Pt to be able to verbalize the signs and sx of cellulitis and the need to contact a MD immediately    Time 3   Period Weeks   Status Achieved           PT Long Term Goals - 09/10/15 1515      PT LONG TERM GOAL #1   Title Pt volume to be decreased by 2.5 cm  in pt Lt UE for pt to be ready for a  compression garment.    Time 6   Period Weeks   Status On-going     PT LONG TERM GOAL #2   Title Pt to have obtained and to be able to don and doff compression garment to prevent increased edema of her Lt UE    Time 6   Period Weeks   Status On-going  Plan - 09/15/15 1559    Clinical Impression Statement Pt circumference remeasured with minimal change in upper extremity but decrease volume in forearm area.  Pt is still unable to get her wedding band off.  Therapist reminded pt that bandaging should never be uncomfortable and if it is she should unwarap one layer at a time.    Rehab Potential Good   PT Frequency 3x / week   PT Duration 6 weeks   PT Treatment/Interventions Manual lymph drainage;Compression bandaging;Therapeutic exercise   PT Next Visit Plan Continue with total decongestive therapy techniques.    Consulted and Agree with Plan of Care Patient      Patient will benefit from skilled therapeutic intervention in order to improve the following deficits and impairments:  Increased edema  Visit Diagnosis: Postmastectomy lymphedema     Problem List Patient Active Problem List   Diagnosis Date Noted  . Diverticulitis of colon 03/08/2015  . LLQ abdominal pain 01/22/2015  . Genetic testing 09/10/2014  . Acquired trigger finger 05/06/2013  . Abnormal genetic test 04/09/2013  . Osteopenia 12/11/2012  . Thyroid nodule 06/21/2012  . GERD (gastroesophageal reflux disease) 06/03/2012  . Adenomatous polyps 06/03/2012  . Breast cancer, right breast, IDC, Multifocal 03/12/2012  . Popliteal cyst 08/18/2010  . Hx Left breast cancer, UOQ, IDC, receptor negative 11/08/1993    Rayetta Humphrey, PT CLT [phone] 09/15/2015, 4:01 PM  [facility] [address] [place], [place], [address] Phone: [phone]   Fax:  [phone]  Name: KEELIN NEVILLE MRN: [id number] Date of Birth: 04-Mar-1945

## 2015-09-17 ENCOUNTER — Ambulatory Visit (HOSPITAL_COMMUNITY): Payer: PPO | Admitting: Physical Therapy

## 2015-09-17 DIAGNOSIS — I972 Postmastectomy lymphedema syndrome: Secondary | ICD-10-CM | POA: Diagnosis not present

## 2015-09-17 NOTE — Therapy (Signed)
[place], [place], [address] Phone: [phone]   Fax:  [phone]  Physical Therapy Treatment  Patient Details  Name: Amy Houston MRN: [id number] Date of Birth: 1945/12/22 Referring Provider: Ancil Linsey  Encounter Date: 09/17/2015      PT End of Session - 09/17/15 1226    Visit Number 14   Number of Visits 18   Date for PT Re-Evaluation 08/19/15   Authorization Type [employer]   Authorization - Visit Number 14   Authorization - Number of Visits 20   PT Start Time 1115   PT Stop Time 1207   PT Time Calculation (min) 52 min   Activity Tolerance Patient tolerated treatment well   Behavior During Therapy [facility] for tasks assessed/performed      Past Medical History:  Diagnosis Date  . Abnormal genetic test 04/09/2013   PALB2 and CHEK2 positivity   . Arthritis    "of the spine" (05/08/2012)  . Breast cancer ([place]) 1995   "left" (05/08/2012)  . Breast cancer, right breast, IDC, Multifocal 03/12/2012   Multifocal, 10 and 8 left breast   . Diabetes mellitus 1999   "dx; never on meds" (05/08/2012)  . Exertional shortness of breath   . GERD (gastroesophageal reflux disease)    tums  . H/O hiatal hernia   . Hemorrhoid   . History of blood transfusion    post child birth  . HTN (hypertension)   . IBS (irritable bowel syndrome)   . Osteopenia 12/11/2012   On Ca++ and Vit D. Bone density in April 2014.  Next bone density due in April 2016.  Marland Kitchen PONV (postoperative nausea and vomiting)   . PPD positive, treated 1999  . Sleep apnea    "gone after I had OR" (05/08/2012)    Past Surgical History:  Procedure Laterality Date  . ABDOMINAL HYSTERECTOMY Bilateral 01/30/2013   Procedure: HYSTERECTOMY ABDOMINAL;  Surgeon: Melina Schools, MD;  Location: [facility];  Service: Gynecology;  Laterality: Bilateral;  . BREAST BIOPSY Left 1995  . BREAST BIOPSY Bilateral 03/2012   "one on the left; 2 on the right"  (05/08/2012)  . BREAST LUMPECTOMY Left 1995  . CHOLECYSTECTOMY  1992  . COLONOSCOPY  12/06/93   Dr. Hayes:single diminutive polyp of the descending colon/extrernal hemorrhoids, benign path  . COLONOSCOPY  11/30/98   Dr Hayes:small rectal polyp/internal hemorrhoids, benign path  . COLONOSCOPY  11/26/2006   Dr. Hayes:sigmoid polyp/small interal hemorrhoids, adenomatous  . COLONOSCOPY WITH ESOPHAGOGASTRODUODENOSCOPY (EGD) N/A 07/01/2012   Dr. Oneida Alar: 2 small adenomas, sigmoid colon diverticula, surveillance in 2019  . DILATION AND CURETTAGE OF UTERUS  1968  . ESOPHAGEAL DILATION  07/01/2012   Dr. Oneida Alar: proximal esophageal web, Schatzki's ring at GE junction, s/p dilaiton. Mild non-erosive gastritis. Repeat EGD July 23, 2012.   . ESOPHAGOGASTRODUODENOSCOPY  1994   Dr. Cristina Gong: small hiatal hernia and Schatzki's ring widely patent, CLO test negative  . ESOPHAGOGASTRODUODENOSCOPY (EGD) WITH ESOPHAGEAL DILATION N/A 07/23/2012   Dr. Fields:proximal esophageal web v. radiation induced stricture/schatzki ring/small HH, s/p dilation  . MASTECTOMY COMPLETE / SIMPLE Left 05/08/2012  . MASTECTOMY COMPLETE / SIMPLE W/ SENTINEL NODE BIOPSY Right 05/08/2012  . SALPINGOOPHORECTOMY Bilateral 01/30/2013   Procedure: SALPINGO OOPHORECTOMY;  Surgeon: Melina Schools, MD;  Location: [facility];  Service: Gynecology;  Laterality: Bilateral;  . SIMPLE MASTECTOMY WITH AXILLARY SENTINEL NODE BIOPSY Right 05/08/2012   Procedure: RIGHT TOTAL MASTECTOMY WITH AXILLARY SENTINEL NODE BIOPSY;  Surgeon: Haywood Lasso, MD;  Location: [facility];  Service: General;  Laterality: Right;  . SIMPLE MASTECTOMY WITH AXILLARY SENTINEL NODE BIOPSY Left 05/08/2012   Procedure: LEFT TOTAL MASTECTOMY;  Surgeon: Haywood Lasso, MD;  Location: [facility];  Service: General;  Laterality: Left;  . TONSILLECTOMY AND ADENOIDECTOMY  ~ 2008  . TUBAL LIGATION  1976  . UVULOPALATOPHARYNGOPLASTY, TONSILLECTOMY AND SEPTOPLASTY  ~ 2008    There were no vitals  filed for this visit.      Subjective Assessment - 09/17/15 1224    Subjective Pt states she feels her arm is getting smaller.  Took her bandages off this morning to shower.    Pertinent History breast cancer    Currently in Pain? No/denies                         [facility] Adult PT Treatment/Exercise - 09/17/15 0001      Manual Therapy   Manual Therapy Manual Lymphatic Drainage (MLD);Compression Bandaging   Manual Lymphatic Drainage (MLD) Including supraclavicular, deep and superficial abdominal, Rt  axillary/inguinal anastomosis and Rt UE.  Manual done  anteriorly and posteriorly.  Posteriorly done sidelying.    Manual followed by compression bandaging.    Compression Bandaging Compression bandage with multilayer short stretch bandages and foam; including fingers.                   PT Short Term Goals - 09/10/15 1515      PT SHORT TERM GOAL #1   Title Pt volume in her Lt UE  to decrase by 2 cm to allow better fit of clothing    Time 3   Period Weeks   Status On-going     PT SHORT TERM GOAL #2   Title Pt to be able to verbalize the signs and sx of cellulitis and the need to contact a MD immediately    Time 3   Period Weeks   Status Achieved           PT Long Term Goals - 09/10/15 1515      PT LONG TERM GOAL #1   Title Pt volume to be decreased by 2.5 cm  in pt Lt UE for pt to be ready for a compression garment.    Time 6   Period Weeks   Status On-going     PT LONG TERM GOAL #2   Title Pt to have obtained and to be able to don and doff compression garment to prevent increased edema of her Lt UE    Time 6   Period Weeks   Status On-going               Plan - 09/17/15 1226    Clinical Impression Statement Pt is close to maximizing benefit of manual.  No induration noted.  Will continue with total decongestive techniques.  Pt question if when treatment was done the lymphatic system would start working correctly.  Therapist explained  once again that this is a chronic condition that will need to be managed.  Explained that once the lymph system is damaged it can not repair itself.     Rehab Potential Good   PT Frequency 3x / week   PT Duration 6 weeks   PT Treatment/Interventions Manual lymph drainage;Compression bandaging;Therapeutic exercise   PT Next Visit Plan Continue with total decongestive therapy techniques.   Remeasure on Wednesday.    Consulted and Agree with Plan  of Care Patient      Patient will benefit from skilled therapeutic intervention in order to improve the following deficits and impairments:  Increased edema  Visit Diagnosis: Postmastectomy lymphedema     Problem List Patient Active Problem List   Diagnosis Date Noted  . Diverticulitis of colon 03/08/2015  . LLQ abdominal pain 01/22/2015  . Genetic testing 09/10/2014  . Acquired trigger finger 05/06/2013  . Abnormal genetic test 04/09/2013  . Osteopenia 12/11/2012  . Thyroid nodule 06/21/2012  . GERD (gastroesophageal reflux disease) 06/03/2012  . Adenomatous polyps 06/03/2012  . Breast cancer, right breast, IDC, Multifocal 03/12/2012  . Popliteal cyst 08/18/2010  . Hx Left breast cancer, UOQ, IDC, receptor negative 11/08/1993  Rayetta Humphrey, PT CLT [phone] 09/17/2015, 12:31 PM  [facility] [address] [place], [place], [address] Phone: [phone]   Fax:  [phone]  Name: Amy Houston MRN: [id number] Date of Birth: 1945/02/05

## 2015-09-20 ENCOUNTER — Encounter (HOSPITAL_COMMUNITY): Payer: PPO | Admitting: Physical Therapy

## 2015-09-22 ENCOUNTER — Ambulatory Visit (HOSPITAL_COMMUNITY): Payer: PPO | Admitting: Physical Therapy

## 2015-09-22 DIAGNOSIS — I972 Postmastectomy lymphedema syndrome: Secondary | ICD-10-CM

## 2015-09-22 NOTE — Therapy (Signed)
[place], [place], [address] Phone: [phone]   Fax:  [phone]  Physical Therapy Treatment  Patient Details  Name: ALEATHEA PUGMIRE MRN: [id number] Date of Birth: 09/22/1945 Referring Provider: Ancil Linsey  Encounter Date: 09/22/2015      PT End of Session - 09/22/15 1449    Visit Number 15   Number of Visits 18   Date for PT Re-Evaluation 08/19/15   Authorization Type [employer]   Authorization - Visit Number 15   Authorization - Number of Visits 20   PT Start Time 6606   PT Stop Time 1445   PT Time Calculation (min) 52 min   Activity Tolerance Patient tolerated treatment well   Behavior During Therapy [facility] for tasks assessed/performed      Past Medical History:  Diagnosis Date  . Abnormal genetic test 04/09/2013   PALB2 and CHEK2 positivity   . Arthritis    "of the spine" (05/08/2012)  . Breast cancer ([place]) 1995   "left" (05/08/2012)  . Breast cancer, right breast, IDC, Multifocal 03/12/2012   Multifocal, 10 and 8 left breast   . Diabetes mellitus 1999   "dx; never on meds" (05/08/2012)  . Exertional shortness of breath   . GERD (gastroesophageal reflux disease)    tums  . H/O hiatal hernia   . Hemorrhoid   . History of blood transfusion    post child birth  . HTN (hypertension)   . IBS (irritable bowel syndrome)   . Osteopenia 12/11/2012   On Ca++ and Vit D. Bone density in April 2014.  Next bone density due in April 2016.  Marland Kitchen PONV (postoperative nausea and vomiting)   . PPD positive, treated 1999  . Sleep apnea    "gone after I had OR" (05/08/2012)    Past Surgical History:  Procedure Laterality Date  . ABDOMINAL HYSTERECTOMY Bilateral 01/30/2013   Procedure: HYSTERECTOMY ABDOMINAL;  Surgeon: Melina Schools, MD;  Location: [facility];  Service: Gynecology;  Laterality: Bilateral;  . BREAST BIOPSY Left 1995  . BREAST BIOPSY Bilateral 03/2012   "one on the left; 2 on the right"  (05/08/2012)  . BREAST LUMPECTOMY Left 1995  . CHOLECYSTECTOMY  1992  . COLONOSCOPY  12/06/93   Dr. Hayes:single diminutive polyp of the descending colon/extrernal hemorrhoids, benign path  . COLONOSCOPY  11/30/98   Dr Hayes:small rectal polyp/internal hemorrhoids, benign path  . COLONOSCOPY  11/26/2006   Dr. Hayes:sigmoid polyp/small interal hemorrhoids, adenomatous  . COLONOSCOPY WITH ESOPHAGOGASTRODUODENOSCOPY (EGD) N/A 07/01/2012   Dr. Oneida Alar: 2 small adenomas, sigmoid colon diverticula, surveillance in 2019  . DILATION AND CURETTAGE OF UTERUS  1968  . ESOPHAGEAL DILATION  07/01/2012   Dr. Oneida Alar: proximal esophageal web, Schatzki's ring at GE junction, s/p dilaiton. Mild non-erosive gastritis. Repeat EGD July 23, 2012.   . ESOPHAGOGASTRODUODENOSCOPY  1994   Dr. Cristina Gong: small hiatal hernia and Schatzki's ring widely patent, CLO test negative  . ESOPHAGOGASTRODUODENOSCOPY (EGD) WITH ESOPHAGEAL DILATION N/A 07/23/2012   Dr. Fields:proximal esophageal web v. radiation induced stricture/schatzki ring/small HH, s/p dilation  . MASTECTOMY COMPLETE / SIMPLE Left 05/08/2012  . MASTECTOMY COMPLETE / SIMPLE W/ SENTINEL NODE BIOPSY Right 05/08/2012  . SALPINGOOPHORECTOMY Bilateral 01/30/2013   Procedure: SALPINGO OOPHORECTOMY;  Surgeon: Melina Schools, MD;  Location: [facility];  Service: Gynecology;  Laterality: Bilateral;  . SIMPLE MASTECTOMY WITH AXILLARY SENTINEL NODE BIOPSY Right 05/08/2012   Procedure: RIGHT TOTAL MASTECTOMY WITH AXILLARY SENTINEL NODE BIOPSY;  Surgeon: Haywood Lasso, MD;  Location: [place];  Service: General;  Laterality: Right;  . SIMPLE MASTECTOMY WITH AXILLARY SENTINEL NODE BIOPSY Left 05/08/2012   Procedure: LEFT TOTAL MASTECTOMY;  Surgeon: Haywood Lasso, MD;  Location: [place];  Service: General;  Laterality: Left;  . TONSILLECTOMY AND ADENOIDECTOMY  ~ 2008  . TUBAL LIGATION  1976  . UVULOPALATOPHARYNGOPLASTY, TONSILLECTOMY AND SEPTOPLASTY  ~ 2008    There were no vitals  filed for this visit.      Subjective Assessment - 09/22/15 1448    Subjective Pt took bandages off this am. States she feels her arm is smaller but she is not noting any increased voiding    Pertinent History breast cancer    Currently in Pain? No/denies               LYMPHEDEMA/ONCOLOGY QUESTIONNAIRE - 09/22/15 1359      Left Upper Extremity Lymphedema   At Axilla  35.8 cm   15 cm Proximal to Olecranon Process 35.1 cm   10 cm Proximal to Olecranon Process 34.8 cm   Olecranon Process 30.4 cm   15 cm Proximal to Ulnar Styloid Process 28.1 cm   10 cm Proximal to Ulnar Styloid Process 24.7 cm   Just Proximal to Ulnar Styloid Process 17.7 cm   Across Hand at [employer] 20 cm   At [place] of 2nd Digit 7 cm   At [facility] 7.3 cm                  [facility] Adult PT Treatment/Exercise - 09/22/15 0001      Manual Therapy   Manual Therapy Manual Lymphatic Drainage (MLD);Compression Bandaging   Manual therapy comments manual completed seperate    Manual Lymphatic Drainage (MLD) Including supraclavicular, deep and superficial abdominal, Rt  axillary/inguinal anastomosis and Rt UE.  Manual done  anteriorly and posteriorly.  Posteriorly done sidelying.    Manual followed by compression bandaging.    Compression Bandaging Compression bandage with multilayer short stretch bandages and foam; including fingers.                   PT Short Term Goals - 09/10/15 1515      PT SHORT TERM GOAL #1   Title Pt volume in her Lt UE  to decrase by 2 cm to allow better fit of clothing    Time 3   Period Weeks   Status On-going     PT SHORT TERM GOAL #2   Title Pt to be able to verbalize the signs and sx of cellulitis and the need to contact a MD immediately    Time 3   Period Weeks   Status Achieved           PT Long Term Goals - 09/10/15 1515      PT LONG TERM GOAL #1   Title Pt volume to be decreased by 2.5 cm  in pt Lt UE for pt to be ready for a  compression garment.    Time 6   Period Weeks   Status On-going     PT LONG TERM GOAL #2   Title Pt to have obtained and to be able to don and doff compression garment to prevent increased edema of her Lt UE    Time 6   Period Weeks   Status On-going               Plan - 09/22/15 1450  Clinical Impression Statement Pt measured today  with  no volume reduction and slight increase in some aspects.  This maybe due to pt unwrapping UE in the AM and treatment was not until the PM.   Pt will be seen next week and then discharge.  Pt understands that she will need to wear the compression bandages during the day and use the pump daily in the evenings.    Rehab Potential Good   PT Frequency 3x / week   PT Duration 6 weeks   PT Treatment/Interventions Manual lymph drainage;Compression bandaging;Therapeutic exercise   PT Next Visit Plan Continue with total decongestive therapy techniques.   Remeasure on Wednesday.    Consulted and Agree with Plan of Care Patient      Patient will benefit from skilled therapeutic intervention in order to improve the following deficits and impairments:  Increased edema  Visit Diagnosis: Postmastectomy lymphedema     Problem List Patient Active Problem List   Diagnosis Date Noted  . Diverticulitis of colon 03/08/2015  . LLQ abdominal pain 01/22/2015  . Genetic testing 09/10/2014  . Acquired trigger finger 05/06/2013  . Abnormal genetic test 04/09/2013  . Osteopenia 12/11/2012  . Thyroid nodule 06/21/2012  . GERD (gastroesophageal reflux disease) 06/03/2012  . Adenomatous polyps 06/03/2012  . Breast cancer, right breast, IDC, Multifocal 03/12/2012  . Popliteal cyst 08/18/2010  . Hx Left breast cancer, UOQ, IDC, receptor negative 11/08/1993   Rayetta Humphrey, PT CLT [phone] 09/22/2015, 2:52 PM  [facility] [address] [place], [place], [address] Phone: [phone]   Fax:   [phone]  Name: IRISH BREISCH MRN: [id number] Date of Birth: 1945/10/16

## 2015-09-28 ENCOUNTER — Ambulatory Visit (HOSPITAL_COMMUNITY): Payer: PPO | Admitting: Physical Therapy

## 2015-09-28 DIAGNOSIS — I972 Postmastectomy lymphedema syndrome: Secondary | ICD-10-CM | POA: Diagnosis not present

## 2015-09-29 ENCOUNTER — Ambulatory Visit (HOSPITAL_COMMUNITY): Payer: PPO | Admitting: Physical Therapy

## 2015-09-29 DIAGNOSIS — I972 Postmastectomy lymphedema syndrome: Secondary | ICD-10-CM | POA: Diagnosis not present

## 2015-09-29 NOTE — Therapy (Signed)
[place], [place], [address] Phone: [phone]   Fax:  [phone]  Physical Therapy Treatment  Patient Details  Name: Amy Houston MRN: [id number] Date of Birth: Jun 08, 1945 Referring Provider: Ancil Linsey  Encounter Date: 09/28/2015      PT End of Session - 09/29/15 0816    Visit Number 15   Number of Visits 18   Date for PT Re-Evaluation 08/19/15   Authorization Type [employer]   Authorization - Visit Number 15   Authorization - Number of Visits 20   PT Start Time 5697   PT Stop Time 1515   PT Time Calculation (min) 40 min   Activity Tolerance Patient tolerated treatment well   Behavior During Therapy [facility] for tasks assessed/performed      Past Medical History:  Diagnosis Date  . Abnormal genetic test 04/09/2013   PALB2 and CHEK2 positivity   . Arthritis    "of the spine" (05/08/2012)  . Breast cancer ([place]) 1995   "left" (05/08/2012)  . Breast cancer, right breast, IDC, Multifocal 03/12/2012   Multifocal, 10 and 8 left breast   . Diabetes mellitus 1999   "dx; never on meds" (05/08/2012)  . Exertional shortness of breath   . GERD (gastroesophageal reflux disease)    tums  . H/O hiatal hernia   . Hemorrhoid   . History of blood transfusion    post child birth  . HTN (hypertension)   . IBS (irritable bowel syndrome)   . Osteopenia 12/11/2012   On Ca++ and Vit D. Bone density in April 2014.  Next bone density due in April 2016.  Marland Kitchen PONV (postoperative nausea and vomiting)   . PPD positive, treated 1999  . Sleep apnea    "gone after I had OR" (05/08/2012)    Past Surgical History:  Procedure Laterality Date  . ABDOMINAL HYSTERECTOMY Bilateral 01/30/2013   Procedure: HYSTERECTOMY ABDOMINAL;  Surgeon: Melina Schools, MD;  Location: [facility];  Service: Gynecology;  Laterality: Bilateral;  . BREAST BIOPSY Left 1995  . BREAST BIOPSY Bilateral 03/2012   "one on the left; 2 on the right"  (05/08/2012)  . BREAST LUMPECTOMY Left 1995  . CHOLECYSTECTOMY  1992  . COLONOSCOPY  12/06/93   Dr. Hayes:single diminutive polyp of the descending colon/extrernal hemorrhoids, benign path  . COLONOSCOPY  11/30/98   Dr Hayes:small rectal polyp/internal hemorrhoids, benign path  . COLONOSCOPY  11/26/2006   Dr. Hayes:sigmoid polyp/small interal hemorrhoids, adenomatous  . COLONOSCOPY WITH ESOPHAGOGASTRODUODENOSCOPY (EGD) N/A 07/01/2012   Dr. Oneida Alar: 2 small adenomas, sigmoid colon diverticula, surveillance in 2019  . DILATION AND CURETTAGE OF UTERUS  1968  . ESOPHAGEAL DILATION  07/01/2012   Dr. Oneida Alar: proximal esophageal web, Schatzki's ring at GE junction, s/p dilaiton. Mild non-erosive gastritis. Repeat EGD July 23, 2012.   . ESOPHAGOGASTRODUODENOSCOPY  1994   Dr. Cristina Gong: small hiatal hernia and Schatzki's ring widely patent, CLO test negative  . ESOPHAGOGASTRODUODENOSCOPY (EGD) WITH ESOPHAGEAL DILATION N/A 07/23/2012   Dr. Fields:proximal esophageal web v. radiation induced stricture/schatzki ring/small HH, s/p dilation  . MASTECTOMY COMPLETE / SIMPLE Left 05/08/2012  . MASTECTOMY COMPLETE / SIMPLE W/ SENTINEL NODE BIOPSY Right 05/08/2012  . SALPINGOOPHORECTOMY Bilateral 01/30/2013   Procedure: SALPINGO OOPHORECTOMY;  Surgeon: Melina Schools, MD;  Location: [facility];  Service: Gynecology;  Laterality: Bilateral;  . SIMPLE MASTECTOMY WITH AXILLARY SENTINEL NODE BIOPSY Right 05/08/2012   Procedure: RIGHT TOTAL MASTECTOMY WITH AXILLARY SENTINEL NODE BIOPSY;  Surgeon: Haywood Lasso, MD;  Location: [facility];  Service: General;  Laterality: Right;  . SIMPLE MASTECTOMY WITH AXILLARY SENTINEL NODE BIOPSY Left 05/08/2012   Procedure: LEFT TOTAL MASTECTOMY;  Surgeon: Haywood Lasso, MD;  Location: [place] [facility];  Service: General;  Laterality: Left;  . TONSILLECTOMY AND ADENOIDECTOMY  ~ 2008  . TUBAL LIGATION  1976  . UVULOPALATOPHARYNGOPLASTY, TONSILLECTOMY AND SEPTOPLASTY  ~ 2008    There were no vitals  filed for this visit.      Subjective Assessment - 09/29/15 0812    Subjective Pt states that she is ready to be done next visit.  Has no complaints    Pertinent History breast cancer    Currently in Pain? No/denies                         [facility] Adult PT Treatment/Exercise - 09/29/15 0001      Manual Therapy   Manual Therapy Manual Lymphatic Drainage (MLD);Compression Bandaging   Manual therapy comments manual completed seperate    Manual Lymphatic Drainage (MLD) Including supraclavicular, deep and superficial abdominal, Rt  axillary/inguinal anastomosis and Rt UE.  Manual done  anteriorly and posteriorly.  Posteriorly done sidelying.    Manual followed by compression bandaging.    Compression Bandaging Compression bandage with multilayer short stretch bandages and foam; including fingers.                   PT Short Term Goals - 09/10/15 1515      PT SHORT TERM GOAL #1   Title Pt volume in her Lt UE  to decrase by 2 cm to allow better fit of clothing    Time 3   Period Weeks   Status On-going     PT SHORT TERM GOAL #2   Title Pt to be able to verbalize the signs and sx of cellulitis and the need to contact a MD immediately    Time 3   Period Weeks   Status Achieved           PT Long Term Goals - 09/10/15 1515      PT LONG TERM GOAL #1   Title Pt volume to be decreased by 2.5 cm  in pt Lt UE for pt to be ready for a compression garment.    Time 6   Period Weeks   Status On-going     PT LONG TERM GOAL #2   Title Pt to have obtained and to be able to don and doff compression garment to prevent increased edema of her Lt UE    Time 6   Period Weeks   Status On-going               Plan - 09/29/15 6568    Clinical Impression Statement Pt given sheet for compression garments.  Reviewed that pt will need to wear garment every day and use compression pump every night.    Rehab Potential Good   PT Frequency 3x / week   PT Duration 6  weeks   PT Treatment/Interventions Manual lymph drainage;Compression bandaging;Therapeutic exercise   PT Next Visit Plan  Remeasure on Wednesday and discharge.    Consulted and Agree with Plan of Care Patient      Patient will benefit from skilled therapeutic intervention in order to improve the following deficits and impairments:  Increased edema  Visit Diagnosis: Postmastectomy lymphedema     Problem List Patient Active Problem List  Diagnosis Date Noted  . Diverticulitis of colon 03/08/2015  . LLQ abdominal pain 01/22/2015  . Genetic testing 09/10/2014  . Acquired trigger finger 05/06/2013  . Abnormal genetic test 04/09/2013  . Osteopenia 12/11/2012  . Thyroid nodule 06/21/2012  . GERD (gastroesophageal reflux disease) 06/03/2012  . Adenomatous polyps 06/03/2012  . Breast cancer, right breast, IDC, Multifocal 03/12/2012  . Popliteal cyst 08/18/2010  . Hx Left breast cancer, UOQ, IDC, receptor negative 11/08/1993    Rayetta Humphrey, PT CLT [phone] 09/29/2015, 8:18 AM  [facility] [address] [place], [place], [address] Phone: [phone]   Fax:  [phone]  Name: Amy Houston MRN: [id number] Date of Birth: 30-May-1945

## 2015-09-29 NOTE — Therapy (Signed)
[place], [place], [address] Phone: [phone]   Fax:  [phone]  Physical Therapy Treatment  Patient Details  Name: Amy Houston MRN: [id number] Date of Birth: October 30, 1945 Referring Provider: Ancil Linsey  Encounter Date: 09/29/2015      PT End of Session - 09/29/15 1551    Visit Number 16   Number of Visits 16   Date for PT Re-Evaluation 08/19/15   Authorization Type [employer]   Authorization - Visit Number 16   Authorization - Number of Visits 16   PT Start Time 6789   PT Stop Time 1440   PT Time Calculation (min) 46 min   Activity Tolerance Patient tolerated treatment well   Behavior During Therapy [facility] for tasks assessed/performed      Past Medical History:  Diagnosis Date  . Abnormal genetic test 04/09/2013   PALB2 and CHEK2 positivity   . Arthritis    "of the spine" (05/08/2012)  . Breast cancer ([place]) 1995   "left" (05/08/2012)  . Breast cancer, right breast, IDC, Multifocal 03/12/2012   Multifocal, 10 and 8 left breast   . Diabetes mellitus 1999   "dx; never on meds" (05/08/2012)  . Exertional shortness of breath   . GERD (gastroesophageal reflux disease)    tums  . H/O hiatal hernia   . Hemorrhoid   . History of blood transfusion    post child birth  . HTN (hypertension)   . IBS (irritable bowel syndrome)   . Osteopenia 12/11/2012   On Ca++ and Vit D. Bone density in April 2014.  Next bone density due in April 2016.  Marland Kitchen PONV (postoperative nausea and vomiting)   . PPD positive, treated 1999  . Sleep apnea    "gone after I had OR" (05/08/2012)    Past Surgical History:  Procedure Laterality Date  . ABDOMINAL HYSTERECTOMY Bilateral 01/30/2013   Procedure: HYSTERECTOMY ABDOMINAL;  Surgeon: Melina Schools, MD;  Location: [facility];  Service: Gynecology;  Laterality: Bilateral;  . BREAST BIOPSY Left 1995  . BREAST BIOPSY Bilateral 03/2012   "one on the left; 2 on the right"  (05/08/2012)  . BREAST LUMPECTOMY Left 1995  . CHOLECYSTECTOMY  1992  . COLONOSCOPY  12/06/93   Dr. Hayes:single diminutive polyp of the descending colon/extrernal hemorrhoids, benign path  . COLONOSCOPY  11/30/98   Dr Hayes:small rectal polyp/internal hemorrhoids, benign path  . COLONOSCOPY  11/26/2006   Dr. Hayes:sigmoid polyp/small interal hemorrhoids, adenomatous  . COLONOSCOPY WITH ESOPHAGOGASTRODUODENOSCOPY (EGD) N/A 07/01/2012   Dr. Oneida Alar: 2 small adenomas, sigmoid colon diverticula, surveillance in 2019  . DILATION AND CURETTAGE OF UTERUS  1968  . ESOPHAGEAL DILATION  07/01/2012   Dr. Oneida Alar: proximal esophageal web, Schatzki's ring at GE junction, s/p dilaiton. Mild non-erosive gastritis. Repeat EGD July 23, 2012.   . ESOPHAGOGASTRODUODENOSCOPY  1994   Dr. Cristina Gong: small hiatal hernia and Schatzki's ring widely patent, CLO test negative  . ESOPHAGOGASTRODUODENOSCOPY (EGD) WITH ESOPHAGEAL DILATION N/A 07/23/2012   Dr. Fields:proximal esophageal web v. radiation induced stricture/schatzki ring/small HH, s/p dilation  . MASTECTOMY COMPLETE / SIMPLE Left 05/08/2012  . MASTECTOMY COMPLETE / SIMPLE W/ SENTINEL NODE BIOPSY Right 05/08/2012  . SALPINGOOPHORECTOMY Bilateral 01/30/2013   Procedure: SALPINGO OOPHORECTOMY;  Surgeon: Melina Schools, MD;  Location: [facility];  Service: Gynecology;  Laterality: Bilateral;  . SIMPLE MASTECTOMY WITH AXILLARY SENTINEL NODE BIOPSY Right 05/08/2012   Procedure: RIGHT TOTAL MASTECTOMY WITH AXILLARY SENTINEL NODE BIOPSY;  Surgeon: Haywood Lasso, MD;  Location: [facility];  Service: General;  Laterality: Right;  . SIMPLE MASTECTOMY WITH AXILLARY SENTINEL NODE BIOPSY Left 05/08/2012   Procedure: LEFT TOTAL MASTECTOMY;  Surgeon: Haywood Lasso, MD;  Location: [facility];  Service: General;  Laterality: Left;  . TONSILLECTOMY AND ADENOIDECTOMY  ~ 2008  . TUBAL LIGATION  1976  . UVULOPALATOPHARYNGOPLASTY, TONSILLECTOMY AND SEPTOPLASTY  ~ 2008    There were no vitals  filed for this visit.      Subjective Assessment - 09/29/15 0812    Subjective Pt states that she is ready to be done next visit.  Has no complaints    Pertinent History breast cancer    Currently in Pain? No/denies               LYMPHEDEMA/ONCOLOGY QUESTIONNAIRE - 09/29/15 1355      Left Upper Extremity Lymphedema   At Axilla  36 cm  initial eval was 37.5   15 cm Proximal to Olecranon Process 35.1 cm  initial eval was 37.1   10 cm Proximal to Olecranon Process 36.3 cm  was 35.9   Olecranon Process 30 cm  was 31.1   15 cm Proximal to Ulnar Styloid Process 28.4 cm  was 28.1   10 cm Proximal to Ulnar Styloid Process 24 cm  was 24.5   Just Proximal to Ulnar Styloid Process 17.5 cm  was 19.1   Across Hand at [employer] 19.3 cm  was 20.2   At [place] of 2nd Digit 7 cm  was 7.8   At [facility] 7.2 cm  was 7.6                   [facility] Adult PT Treatment/Exercise - 09/29/15 1551      Manual Therapy   Manual Therapy Manual Lymphatic Drainage (MLD);Compression Bandaging   Manual therapy comments manual completed seperate    Manual Lymphatic Drainage (MLD) Including supraclavicular, deep and superficial abdominal, Rt  axillary/inguinal anastomosis and Rt UE.  Manual done  anteriorly and posteriorly.  Posteriorly done sidelying.    Manual followed by compression bandaging.    Compression Bandaging Compression bandage with multilayer short stretch bandages and foam; including fingers.                   PT Short Term Goals - 09/29/15 1557      PT SHORT TERM GOAL #1   Title Pt volume in her Lt UE  to decrase by 2 cm to allow better fit of clothing    Time 3   Period Weeks   Status Achieved     PT SHORT TERM GOAL #2   Title Pt to be able to verbalize the signs and sx of cellulitis and the need to contact a MD immediately    Time 3   Period Weeks   Status Achieved           PT Long Term Goals - 09/29/15 1557      PT LONG TERM GOAL #1    Title Pt volume to be decreased by 2.5 cm  in pt Lt UE for pt to be ready for a compression garment.    Time 6   Period Weeks   Status Partially Met     PT LONG TERM GOAL #2   Title Pt to have obtained and to be able to don and doff compression garment to prevent increased edema of her Lt UE  Baseline Pt called outlet while in therapy session.  Pt will fit in a size medium. Pt planning on obtaining garment in the next few days.    Time 6   Period Weeks   Status On-going               Plan - 10-29-2015 1554    Clinical Impression Statement Pt given and reviewed self massage and self bandaging sheet.  Pt measured and would be a medium for arm compression.  Pt verbalized the importance of using compression pump as well as wearng compression garment.     Rehab Potential Good   PT Frequency 3x / week   PT Duration 6 weeks   PT Treatment/Interventions Manual lymph drainage;Compression bandaging;Therapeutic exercise   PT Next Visit Plan Discharge pt    Consulted and Agree with Plan of Care Patient      Patient will benefit from skilled therapeutic intervention in order to improve the following deficits and impairments:  Increased edema  Visit Diagnosis: Postmastectomy lymphedema       G-Codes - Oct 29, 2015 1558    Functional Assessment Tool Used measurement of edema    Functional Limitation Other PT primary   Other PT Primary Goal Status (Z6109) At least 1 percent but less than 20 percent impaired, limited or restricted   Other PT Primary Discharge Status (U0454) At least 1 percent but less than 20 percent impaired, limited or restricted      Problem List Patient Active Problem List   Diagnosis Date Noted  . Diverticulitis of colon 03/08/2015  . LLQ abdominal pain 01/22/2015  . Genetic testing 09/10/2014  . Acquired trigger finger 05/06/2013  . Abnormal genetic test 04/09/2013  . Osteopenia 12/11/2012  . Thyroid nodule 06/21/2012  . GERD (gastroesophageal reflux  disease) 06/03/2012  . Adenomatous polyps 06/03/2012  . Breast cancer, right breast, IDC, Multifocal 03/12/2012  . Popliteal cyst 08/18/2010  . Hx Left breast cancer, UOQ, IDC, receptor negative 11/08/1993   Rayetta Humphrey, PT CLT [phone] October 29, 2015, 4:00 PM  [facility] [address] [place], [place], [address] Phone: [phone]   Fax:  [phone]  Name: Amy Houston MRN: [id number] Date of Birth: 1945-03-24 PHYSICAL THERAPY DISCHARGE SUMMARY  Visits from Start of Care: 16  Current functional level related to goals / functional outcomes: See above    Remaining deficits: Pt UE edema has not totally resolved    Education / Equipment: Self massage, self bandaging  Plan: Patient agrees to discharge.  Patient goals were partially met. Patient is being discharged due to being pleased with the current functional level.  ?????       Rayetta Humphrey, [place] CLT [phone]

## 2015-10-01 ENCOUNTER — Ambulatory Visit (HOSPITAL_COMMUNITY): Payer: PPO | Admitting: Physical Therapy

## 2015-11-17 ENCOUNTER — Other Ambulatory Visit: Payer: Self-pay | Admitting: Gastroenterology

## 2015-12-06 ENCOUNTER — Other Ambulatory Visit (HOSPITAL_COMMUNITY): Payer: Self-pay | Admitting: Oncology

## 2015-12-06 DIAGNOSIS — R911 Solitary pulmonary nodule: Secondary | ICD-10-CM

## 2015-12-15 ENCOUNTER — Ambulatory Visit (HOSPITAL_COMMUNITY)
Admission: RE | Admit: 2015-12-15 | Discharge: 2015-12-15 | Disposition: A | Discharge status: Home / Self Care | Payer: PPO | Source: Ambulatory Visit | Attending: Oncology | Admitting: Oncology

## 2015-12-15 DIAGNOSIS — I2510 Atherosclerotic heart disease of native coronary artery without angina pectoris: Secondary | ICD-10-CM | POA: Insufficient documentation

## 2015-12-15 DIAGNOSIS — R911 Solitary pulmonary nodule: Secondary | ICD-10-CM | POA: Diagnosis not present

## 2015-12-15 DIAGNOSIS — R918 Other nonspecific abnormal finding of lung field: Secondary | ICD-10-CM | POA: Diagnosis not present

## 2015-12-20 ENCOUNTER — Other Ambulatory Visit (HOSPITAL_COMMUNITY): Payer: Self-pay

## 2015-12-20 ENCOUNTER — Encounter (HOSPITAL_COMMUNITY): Payer: Self-pay | Admitting: Hematology & Oncology

## 2015-12-20 ENCOUNTER — Encounter (HOSPITAL_BASED_OUTPATIENT_CLINIC_OR_DEPARTMENT_OTHER): Payer: PPO

## 2015-12-20 ENCOUNTER — Encounter (HOSPITAL_COMMUNITY): Payer: PPO | Attending: Hematology & Oncology | Admitting: Hematology & Oncology

## 2015-12-20 ENCOUNTER — Encounter (HOSPITAL_COMMUNITY): Payer: PPO

## 2015-12-20 VITALS — BP 154/60 | HR 81 | Temp 98.7°F | Resp 16 | Wt 168.8 lb

## 2015-12-20 DIAGNOSIS — Z1589 Genetic susceptibility to other disease: Secondary | ICD-10-CM

## 2015-12-20 DIAGNOSIS — I890 Lymphedema, not elsewhere classified: Secondary | ICD-10-CM | POA: Diagnosis not present

## 2015-12-20 DIAGNOSIS — Z1509 Genetic susceptibility to other malignant neoplasm: Secondary | ICD-10-CM

## 2015-12-20 DIAGNOSIS — M8580 Other specified disorders of bone density and structure, unspecified site: Secondary | ICD-10-CM | POA: Diagnosis not present

## 2015-12-20 DIAGNOSIS — Z1502 Genetic susceptibility to malignant neoplasm of ovary: Secondary | ICD-10-CM

## 2015-12-20 DIAGNOSIS — Z79811 Long term (current) use of aromatase inhibitors: Secondary | ICD-10-CM

## 2015-12-20 DIAGNOSIS — C50911 Malignant neoplasm of unspecified site of right female breast: Secondary | ICD-10-CM | POA: Diagnosis not present

## 2015-12-20 DIAGNOSIS — C50919 Malignant neoplasm of unspecified site of unspecified female breast: Secondary | ICD-10-CM

## 2015-12-20 DIAGNOSIS — Z79899 Other long term (current) drug therapy: Secondary | ICD-10-CM | POA: Diagnosis not present

## 2015-12-20 LAB — COMPREHENSIVE METABOLIC PANEL
ALBUMIN: 4.2 g/dL (ref 3.5–5.0)
ALT: 39 U/L (ref 14–54)
AST: 32 U/L (ref 15–41)
Alkaline Phosphatase: 37 U/L — ABNORMAL LOW (ref 38–126)
Anion gap: 7 (ref 5–15)
BILIRUBIN TOTAL: 0.5 mg/dL (ref 0.3–1.2)
BUN: 17 mg/dL (ref 6–20)
CHLORIDE: 103 mmol/L (ref 101–111)
CO2: 25 mmol/L (ref 22–32)
CREATININE: 0.84 mg/dL (ref 0.44–1.00)
Calcium: 9.3 mg/dL (ref 8.9–10.3)
GFR calc Af Amer: >60 mL/min (ref >60)
GLUCOSE: 202 mg/dL — AB (ref 65–99)
Potassium: 3.6 mmol/L (ref 3.5–5.1)
Sodium: 135 mmol/L (ref 135–145)
TOTAL PROTEIN: 7.4 g/dL (ref 6.5–8.1)

## 2015-12-20 LAB — LIPID PANEL
CHOL/HDL RATIO: 4.3 ratio
CHOLESTEROL: 159 mg/dL (ref 0–200)
HDL: 37 mg/dL — ABNORMAL LOW (ref >40)
LDL Cholesterol: 78 mg/dL (ref 0–99)
Triglycerides: 220 mg/dL — ABNORMAL HIGH (ref <150)
VLDL: 44 mg/dL — ABNORMAL HIGH (ref 0–40)

## 2015-12-20 LAB — CBC WITH DIFFERENTIAL/PLATELET
Basophils Absolute: 0 10*3/uL (ref 0.0–0.1)
Basophils Relative: 0 %
EOS ABS: 0 10*3/uL (ref 0.0–0.7)
EOS PCT: 0 %
HCT: 37.4 % (ref 36.0–46.0)
Hemoglobin: 12.3 g/dL (ref 12.0–15.0)
LYMPHS ABS: 2.3 10*3/uL (ref 0.7–4.0)
LYMPHS PCT: 34 %
MCH: 31.6 pg (ref 26.0–34.0)
MCHC: 32.9 g/dL (ref 30.0–36.0)
MCV: 96.1 fL (ref 78.0–100.0)
MONO ABS: 0.4 10*3/uL (ref 0.1–1.0)
Monocytes Relative: 6 %
Neutro Abs: 4.1 10*3/uL (ref 1.7–7.7)
Neutrophils Relative %: 60 %
PLATELETS: 267 10*3/uL (ref 150–400)
RBC: 3.89 MIL/uL (ref 3.87–5.11)
RDW: 12.7 % (ref 11.5–15.5)
WBC: 6.8 10*3/uL (ref 4.0–10.5)

## 2015-12-20 MED ORDER — DENOSUMAB 60 MG/ML ~~LOC~~ SOLN
60.0000 mg | Freq: Once | SUBCUTANEOUS | Status: AC
  Administered 2015-12-20: 60 mg via SUBCUTANEOUS
  Filled 2015-12-20: qty 1

## 2015-12-20 NOTE — Progress Notes (Signed)
Amy Bogus, MD [address] [place] [address]  High risk medication use - Plan: DG Bone Density, Lipid panel  Malignant neoplasm of right female breast, unspecified estrogen receptor status, unspecified site of breast ([place]) - Plan: CBC with Differential, Comprehensive metabolic panel  CURRENT THERAPY: Aromasin daily beginning on 06/03/2012 and she will take this lifelong due to increased recurrence rate with PALB2 and CHEK2 positivity. Osteopenia, calcium plus vitamin D. Prolia therapy  INTERVAL HISTORY: Amy Houston 70 y.o. female returns for  regular  visit for followup of right sided breast cancer,GRADE II, 1.2 cm invasive ductal carcinoma, ER +99%, PR +72%, grade 2, LV I positive, Ki-67 marker high at 60%, but 4 sentinel lymph nodes negative for metastatic disease. HER-2/neu nonamplified. S/P Bilateral mastectomy on 05/08/2012 by Dr. Osborn Coho. Her Oncotype DX score was 14 giving her recurrence rate of 9% at 5 years of hormonal therapy. With that in mind she was not considered a candidate for sytemic chemotherapy. Now on Aromasin beginning on 06/03/2012 and she will take this lifelong due to her PALB2 And CHEK 2 positivity. Additionally, she is S/P bilateral salpingo-oophorectomy on 01/30/2013. 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 followed by radiation therapy. She was not given tamoxifen. There was some question of Megace one tablet a day but she is not sure of the duration. This may have been for hot flashes which she remembers were severe. She was eventually placed on Effexor for hot flashes but is now on the Effexor for depression.   Amy Houston is unaccompanied. I personally reviewed and went over laboratory studies with the patient. She feels good today.   She says she did not fast before  her most current blood work and wonders if that is why her triglycerides are high.   She has been tired, but she believes this is because she's been running around getting ready for Christmas.   She has been using a sleeve for her lymphedema, which helps, but she says it causes some red marks up her arms. She did complete lymphedema therapy and notes a significant improvement in her LUE. She is pleased with this. She has good function in her arm and hand.   She is still having problems with her IBS. Continues to follow at [facility].   Denies any other complaints.     Breast cancer, right breast, IDC, Multifocal   03/19/2012 Initial Diagnosis    Breast cancer, right breast, IDC, Multifocal      05/08/2012 Surgery    Bilateral simple mastectomies by Dr. Margot Chimes      06/03/2012 -  Chemotherapy    Aromatase inhibitor.  PALB2 and CHEK2+ and therefore would benefit from lifelong AI therapy.      01/30/2013 Surgery    Laparotomy, minimal lysis of adhesions, abdominal hysterectomy, bilateral salpingo-oophorectomy.       Oncologically, the patient denies any complaints and ROS questioning is negative.  Past Medical History:  Diagnosis Date  . Abnormal genetic test 04/09/2013   PALB2 and CHEK2 positivity   . Arthritis    "of the spine" (05/08/2012)  . Breast cancer ([place]) 1995   "left" (05/08/2012)  . Breast cancer, right breast, IDC, Multifocal 03/12/2012   Multifocal, 10 and 8 left breast   . Diabetes mellitus 1999   "dx;  never on meds" (05/08/2012)  . Exertional shortness of breath   . GERD (gastroesophageal reflux disease)    tums  . H/O hiatal hernia   . Hemorrhoid   . History of blood transfusion    post child birth  . HTN (hypertension)   . IBS (irritable bowel syndrome)   . Osteopenia 12/11/2012   On Ca++ and Vit D. Bone density in April 2014.  Next bone density due in April 2016.  Marland Kitchen PONV (postoperative nausea and vomiting)   . PPD positive, treated 1999  . Sleep apnea     "gone after I had OR" (05/08/2012)    has Popliteal cyst; Breast cancer, right breast, IDC, Multifocal; Hx Left breast cancer, UOQ, IDC, receptor negative; GERD (gastroesophageal reflux disease); Adenomatous polyps; Thyroid nodule; Osteopenia; Abnormal genetic test; Acquired trigger finger; Genetic testing; LLQ abdominal pain; and Diverticulitis of colon on her problem list.     is allergic to penicillins.  Amy Houston had no medications administered during this visit.  Past Surgical History:  Procedure Laterality Date  . ABDOMINAL HYSTERECTOMY Bilateral 01/30/2013   Procedure: HYSTERECTOMY ABDOMINAL;  Surgeon: Melina Schools, MD;  Location: [facility];  Service: Gynecology;  Laterality: Bilateral;  . BREAST BIOPSY Left 1995  . BREAST BIOPSY Bilateral 03/2012   "one on the left; 2 on the right" (05/08/2012)  . BREAST LUMPECTOMY Left 1995  . CHOLECYSTECTOMY  1992  . COLONOSCOPY  12/06/93   Dr. Hayes:single diminutive polyp of the descending colon/extrernal hemorrhoids, benign path  . COLONOSCOPY  11/30/98   Dr Hayes:small rectal polyp/internal hemorrhoids, benign path  . COLONOSCOPY  11/26/2006   Dr. Hayes:sigmoid polyp/small interal hemorrhoids, adenomatous  . COLONOSCOPY WITH ESOPHAGOGASTRODUODENOSCOPY (EGD) N/A 07/01/2012   Dr. Oneida Alar: 2 small adenomas, sigmoid colon diverticula, surveillance in 2019  . DILATION AND CURETTAGE OF UTERUS  1968  . ESOPHAGEAL DILATION  07/01/2012   Dr. Oneida Alar: proximal esophageal web, Schatzki's ring at GE junction, s/p dilaiton. Mild non-erosive gastritis. Repeat EGD July 23, 2012.   . ESOPHAGOGASTRODUODENOSCOPY  1994   Dr. Cristina Gong: small hiatal hernia and Schatzki's ring widely patent, CLO test negative  . ESOPHAGOGASTRODUODENOSCOPY (EGD) WITH ESOPHAGEAL DILATION N/A 07/23/2012   Dr. Fields:proximal esophageal web v. radiation induced stricture/schatzki ring/small HH, s/p dilation  . MASTECTOMY COMPLETE / SIMPLE Left 05/08/2012  . MASTECTOMY COMPLETE / SIMPLE W/  SENTINEL NODE BIOPSY Right 05/08/2012  . SALPINGOOPHORECTOMY Bilateral 01/30/2013   Procedure: SALPINGO OOPHORECTOMY;  Surgeon: Melina Schools, MD;  Location: [facility];  Service: Gynecology;  Laterality: Bilateral;  . SIMPLE MASTECTOMY WITH AXILLARY SENTINEL NODE BIOPSY Right 05/08/2012   Procedure: RIGHT TOTAL MASTECTOMY WITH AXILLARY SENTINEL NODE BIOPSY;  Surgeon: Haywood Lasso, MD;  Location: [facility];  Service: General;  Laterality: Right;  . SIMPLE MASTECTOMY WITH AXILLARY SENTINEL NODE BIOPSY Left 05/08/2012   Procedure: LEFT TOTAL MASTECTOMY;  Surgeon: Haywood Lasso, MD;  Location: [facility];  Service: General;  Laterality: Left;  . TONSILLECTOMY AND ADENOIDECTOMY  ~ 2008  . TUBAL LIGATION  1976  . UVULOPALATOPHARYNGOPLASTY, TONSILLECTOMY AND SEPTOPLASTY  ~ 2008   FAMILY HISTORY UPDATE: 2 daughters, both received her cancer gene, youngest daughter diagnosed with breast cancer , oldest regularly seen at [facility] for breast cancer Sister received one of her 2 abnormality genes however, she is completely normal   Review of Systems  Constitutional: Positive for malaise/fatigue.  HENT: Negative.   Eyes: Negative.   Respiratory: Negative.   Cardiovascular: Negative.   Gastrointestinal: Negative.  IBS  Genitourinary: Negative.   Musculoskeletal:       Lymphedema of arms - improving  Skin: Negative.   Neurological: Negative.   Endo/Heme/Allergies: Negative.   Psychiatric/Behavioral: Negative.   All other systems reviewed and are negative. 14 point review of systems was performed and is negative except as detailed under history of present illness and above  PHYSICAL EXAMINATION  ECOG PERFORMANCE STATUS: 0 - Asymptomatic  Vitals:   12/20/15 1420  BP: (!) 154/60  Pulse: 81  Resp: 16  Temp: 98.7 F (37.1 C)     Physical Exam  Constitutional: She is oriented to person, place, and time and well-developed, well-nourished, and in no distress.  HENT:  Head: Normocephalic  and atraumatic.  Eyes: EOM are normal. Pupils are equal, round, and reactive to light. No scleral icterus.  Neck: Normal range of motion. Neck supple.  Cardiovascular: Normal rate, regular rhythm and normal heart sounds.   Pulmonary/Chest: Breath sounds normal. No respiratory distress. She has no wheezes. She has no rales.    Abdominal: Soft. Bowel sounds are normal. She exhibits no distension. There is no tenderness. There is no rebound.  Musculoskeletal: Normal range of motion. She exhibits no edema.  Lymphadenopathy:    She has no cervical adenopathy.  Neurological: She is alert and oriented to person, place, and time. No cranial nerve deficit. Gait normal.  Skin: Skin is warm and dry.  Psychiatric: Mood, memory, affect and judgment normal.  Nursing note and vitals reviewed.   LABORATORY DATA: I have reviewed the data as listed.  CBC    Component Value Date/Time   WBC 6.8 12/20/2015 1307   RBC 3.89 12/20/2015 1307   HGB 12.3 12/20/2015 1307   HCT 37.4 12/20/2015 1307   PLT 267 12/20/2015 1307   MCV 96.1 12/20/2015 1307   MCH 31.6 12/20/2015 1307   MCHC 32.9 12/20/2015 1307   RDW 12.7 12/20/2015 1307   LYMPHSABS 2.3 12/20/2015 1307   MONOABS 0.4 12/20/2015 1307   EOSABS 0.0 12/20/2015 1307   BASOSABS 0.0 12/20/2015 1307      Chemistry      Component Value Date/Time   NA 135 12/20/2015 1307   K 3.6 12/20/2015 1307   CL 103 12/20/2015 1307   CO2 25 12/20/2015 1307   BUN 17 12/20/2015 1307   CREATININE 0.84 12/20/2015 1307      Component Value Date/Time   CALCIUM 9.3 12/20/2015 1307   ALKPHOS 37 (L) 12/20/2015 1307   AST 32 12/20/2015 1307   ALT 39 12/20/2015 1307   BILITOT 0.5 12/20/2015 1307      RADIOGRAPHIC STUDIES: I have reviewed the above documentation for accuracy and completeness, and I agree with the above. Study Result   CLINICAL DATA:  Pulmonary nodules.  EXAM: CT CHEST WITHOUT CONTRAST  TECHNIQUE: Multidetector CT imaging of the chest  was performed following the standard protocol without IV contrast.  COMPARISON:  12/15/2014.  FINDINGS: Cardiovascular: Coronary artery calcification. Heart size normal. No pericardial effusion.  Mediastinum/Nodes: No pathologically enlarged mediastinal or axillary lymph nodes. Surgical clips in the axillary regions bilaterally. Hilar regions are difficult to definitively evaluate without IV contrast. Esophagus is grossly unremarkable.  Lungs/Pleura: Image quality is somewhat degraded by respiratory motion. 4 mm posterior left upper lobe nodule is unchanged. Lungs are otherwise clear. No pleural fluid. Airway is unremarkable.  Upper Abdomen: Visualized portions of the liver, glands, kidneys, spleen, pancreas, stomach and bowel are grossly unremarkable. Cholecystectomy. No upper abdominal adenopathy.  Musculoskeletal: No  worrisome lytic or sclerotic lesions. Degenerative changes are seen in the spine. Bilateral mastectomies.  IMPRESSION: 1. 4 mm left upper lobe nodule is unchanged, indicative of a benign lesion. 2. Coronary artery calcification.   Electronically Signed   By: Lorin Picket M.D.   On: 12/15/2015 16:03    ASSESSMENT:  1. Right sided breast cancer,GRADE II, 1.2 cm invasive ductal carcinoma, ER +99%, PR +72%, grade 2, LV I positive, Ki-67 marker high at 60%, but 4 sentinel lymph nodes negative for metastatic disease. HER-2/neu nonamplified. S/P Bilateral mastectomy on 05/08/2012 by Dr. Osborn Coho. Her Oncotype DX score was 14 giving her recurrence rate of 9% at 5 years of hormonal therapy. With that in mind she was not considered a candidate for sytemic chemotherapy. Now on Aromasin beginning on 06/03/2012 and she will take this lifelong due to her PALB2 And CHEK 2 positivity. S/P bilateral salpingo-oophorectomy on 01/30/2013. 2. PALB2 + mutation  3. CHEK2 + mutation  4. left-sided breast cancer 1996, treated with lumpectomy followed by chemotherapy  consisting of CMF for 8 cycles. followed by radiation therapy. She was not given tamoxifen. There was some question of Megace one tablet a day but she is not sure of the duration. This may have been for hot flashes which she remembers were severe. She was eventually placed on Effexor for hot flashes but is now on the Effexor for depression.  5. Vitamin D deficiency on replacement  6. Osteopenia, next bone density due in April 2018. On Ca++ and Vit D. Prolia therapy  7. Pulmonary Nodules stable on imaging for greater than 2 years therefore presumed benign based on ongoing stability 8. Lymphedema  Patient Active Problem List   Diagnosis Date Noted  . Diverticulitis of colon 03/08/2015  . LLQ abdominal pain 01/22/2015  . Genetic testing 09/10/2014  . Acquired trigger finger 05/06/2013  . Abnormal genetic test 04/09/2013  . Osteopenia 12/11/2012  . Thyroid nodule 06/21/2012  . GERD (gastroesophageal reflux disease) 06/03/2012  . Adenomatous polyps 06/03/2012  . Breast cancer, right breast, IDC, Multifocal 03/12/2012  . Popliteal cyst 08/18/2010  . Hx Left breast cancer, UOQ, IDC, receptor negative 11/08/1993    PLAN:  1. I personally reviewed and went over laboratory results with the patient.  The results are noted within this dictation.  She wants to repeat her lipid profile when she is fasting, I would recommend that.  2. Chart reviewed 3. Bone density is due in April 2018. This was ordered today 4. Continue Aromasin daily.  Compliance encouraged.  She will require this lifelong due to increased risk of recurrence with PALB2 and CHEK2 mutations  5. Prolia for osteopenia and ongoing AI therapy 6. Return in 6 months for follow-up. 7. Continued to encourage ongoing weight loss. Also encouraged increasing physical activity 8. I discussed the importance of treating her lymphedema. I discussed the long term risks of untreated lymphedema to her extremity including skin infections  (cellulitis, erysipelas, and lymphangitis), lymphangiosarcoma, and reduced quality of life including aspects of emotional, functional, physical, and social well-being. She did completed lymphedema therapy and is improved!  I have re-ordered her chest MRI for June of next year of which may or may not be approved. She understands that with bilateral mastectomies no additional imaging is realistically required.  RTC 6 months.   Breast exam/chest wall exam performed today.  Continue Prolia, calcium, and vitamin D.   Orders Placed This Encounter  Procedures  . DG Bone Density    Standing Status:  Future    Standing Expiration Date:   12/19/2016    Order Specific Question:   Reason for Exam (SYMPTOM  OR DIAGNOSIS REQUIRED)    Answer:   high risk medication    Order Specific Question:   Preferred imaging location?    Answer:   [facility]  . Lipid panel    Standing Status:   Future    Standing Expiration Date:   12/19/2016  . CBC with Differential    Standing Status:   Future    Standing Expiration Date:   12/19/2016  . Comprehensive metabolic panel    Standing Status:   Future    Standing Expiration Date:   12/19/2016      THERAPY PLAN:  Encouraged compliance with Aromasin which we recommend at this time she continue lifelong due to [place] and CHEK2 positivity. We will follow NCCN guidelines pertaining to surveillance.   NCCN guidelines recommends the following surveillance for invasive breast cancer:  A. History and Physical exam every 4-6 months for 5 years and then every 12 months.  B. Mammography every 12 months  C. Women on Tamoxifen: annual gynecologic assessment every 12 months if uterus is present.  D. Women on aromatase inhibitor or who experience ovarian failure secondary to treatment should have monitoring of bone health with a bone mineral density determination at baseline and periodically thereafter.  E. Assess and encourage adherence to adjuvant endocrine  therapy.  F. Evidence suggests that active lifestyle and achieving and maintaining an ideal body weight (20-25 BMI) may lead to optimal breast cancer outcomes.  All questions were answered. The patient knows to call the clinic with any problems, questions or concerns. We can certainly see the patient much sooner if necessary.  This document serves as a record of services personally performed by Ancil Linsey, MD. It was created on her behalf by Martinique Casey, a trained medical scribe. The creation of this record is based on the scribe's personal observations and the provider's statements to them. This document has been checked and approved by the attending provider.  I have reviewed the above documentation for accuracy and completeness, and I agree with the above.  Kelby Fam. Whitney Muse, MD

## 2015-12-20 NOTE — Patient Instructions (Signed)
[place] at [facility] Discharge Instructions  RECOMMENDATIONS MADE BY THE CONSULTANT AND ANY TEST RESULTS WILL BE SENT TO YOUR REFERRING PHYSICIAN.  Prolia today.    Thank you for choosing [place] at [facility] to provide your oncology and hematology care.  To afford each patient quality time with our provider, please arrive at least 15 minutes before your scheduled appointment time.   Beginning January 23rd 2017 lab work for the [facility] will be done in the  Main lab at [facility] on 1st floor. If you have a lab appointment with the [facility] please come in thru the  Main Entrance and check in at the main information desk  You need to re-schedule your appointment should you arrive 10 or more minutes late.  We strive to give you quality time with our providers, and arriving late affects you and other patients whose appointments are after yours.  Also, if you no show three or more times for appointments you may be dismissed from the clinic at the providers discretion.     Again, thank you for choosing [facility].  Our hope is that these requests will decrease the amount of time that you wait before being seen by our physicians.       _____________________________________________________________  Should you have questions after your visit to [facility], please contact our office at [phone] between the hours of 8:30 a.m. and 4:30 p.m.  Voicemails left after 4:30 p.m. will not be returned until the following business day.  For prescription refill requests, have your pharmacy contact our office.         Resources For Cancer Patients and their Caregivers ? American Cancer Society: Can assist with transportation, wigs, general needs, runs Look Good Feel Better.        702 698 5290 ? Cancer Care: Provides financial assistance, online support groups, medication/co-pay assistance.  1-800-813-HOPE  214-289-0624) ? [place] Assists [place] cancer patients and their families through emotional , educational and financial support.  (475) 133-4772 ? [place] DSS Where to apply for food stamps, Medicaid and utility assistance. (647)519-3867 ? RCATS: Transportation to medical appointments. 3208177295 ? Social Security Administration: May apply for disability if have a Stage IV cancer. 609-380-8614 2898210556 ? [employer], Disability and Transit Services: Assists with nutrition, care and transit needs. [place] Support Programs: '@10RELATIVEDAYS'$ @ > Cancer Support Group  2nd Tuesday of the month 1pm-2pm, Journey Room  > Creative Journey  3rd Tuesday of the month 1130am-1pm, Journey Room  > Look Good Feel Better  1st Wednesday of the month 10am-12 noon, Journey Room (Call [facility] to register [phone])

## 2015-12-20 NOTE — Progress Notes (Signed)
Amy Houston presents today for injection per MD orders. Prolia administered SQ in left Abdomen. Administration without incident. Patient tolerated well.

## 2015-12-20 NOTE — Patient Instructions (Addendum)
[place] at [facility] Discharge Instructions  RECOMMENDATIONS MADE BY THE CONSULTANT AND ANY TEST RESULTS WILL BE SENT TO YOUR REFERRING PHYSICIAN.  You saw Dr.Penland today. Follow up in 6 months. Continue prolia Dexa in April. Repeat lipid profile next week. See Amy at checkout for appointments.  Thank you for choosing [place] at [facility] to provide your oncology and hematology care.  To afford each patient quality time with our provider, please arrive at least 15 minutes before your scheduled appointment time.   Beginning January 23rd 2017 lab work for the [facility] will be done in the  Main lab at [facility] on 1st floor. If you have a lab appointment with the [facility] please come in thru the  Main Entrance and check in at the main information desk  You need to re-schedule your appointment should you arrive 10 or more minutes late.  We strive to give you quality time with our providers, and arriving late affects you and other patients whose appointments are after yours.  Also, if you no show three or more times for appointments you may be dismissed from the clinic at the providers discretion.     Again, thank you for choosing [facility].  Our hope is that these requests will decrease the amount of time that you wait before being seen by our physicians.       _____________________________________________________________  Should you have questions after your visit to [facility], please contact our office at [phone] between the hours of 8:30 a.m. and 4:30 p.m.  Voicemails left after 4:30 p.m. will not be returned until the following business day.  For prescription refill requests, have your pharmacy contact our office.         Resources For Cancer Patients and their Caregivers ? American Cancer Society: Can assist with transportation, wigs, general needs, runs Look Good Feel Better.         814-518-2210 ? Cancer Care: Provides financial assistance, online support groups, medication/co-pay assistance.  1-800-813-HOPE 612-275-1912) ? [place] Assists [place] cancer patients and their families through emotional , educational and financial support.  (325)538-7176 ? [place] DSS Where to apply for food stamps, Medicaid and utility assistance. 570-327-5423 ? RCATS: Transportation to medical appointments. (470)694-1872 ? Social Security Administration: May apply for disability if have a Stage IV cancer. 838-346-9808 (214)483-5425 ? [employer], Disability and Transit Services: Assists with nutrition, care and transit needs. [place] Support Programs: '@10RELATIVEDAYS'$ @ > Cancer Support Group  2nd Tuesday of the month 1pm-2pm, Journey Room  > Creative Journey  3rd Tuesday of the month 1130am-1pm, Journey Room  > Look Good Feel Better  1st Wednesday of the month 10am-12 noon, Journey Room (Call [facility] to register [phone])

## 2015-12-21 LAB — HEMOGLOBIN A1C
HEMOGLOBIN A1C: 7.3 % — AB (ref 4.8–5.6)
Mean Plasma Glucose: 163 mg/dL

## 2015-12-29 ENCOUNTER — Encounter (HOSPITAL_COMMUNITY): Payer: PPO

## 2015-12-29 DIAGNOSIS — Z79899 Other long term (current) drug therapy: Secondary | ICD-10-CM

## 2015-12-29 DIAGNOSIS — C50911 Malignant neoplasm of unspecified site of right female breast: Secondary | ICD-10-CM | POA: Diagnosis not present

## 2015-12-29 LAB — LIPID PANEL
CHOL/HDL RATIO: 5.2 ratio
CHOLESTEROL: 162 mg/dL (ref 0–200)
HDL: 31 mg/dL — AB (ref >40)
LDL Cholesterol: 105 mg/dL — ABNORMAL HIGH (ref 0–99)
Triglycerides: 129 mg/dL (ref <150)
VLDL: 26 mg/dL (ref 0–40)

## 2016-01-05 ENCOUNTER — Other Ambulatory Visit (HOSPITAL_COMMUNITY): Payer: Self-pay | Admitting: Pulmonary Disease

## 2016-01-05 ENCOUNTER — Ambulatory Visit (HOSPITAL_COMMUNITY)
Admission: RE | Admit: 2016-01-05 | Discharge: 2016-01-05 | Disposition: A | Discharge status: Home / Self Care | Payer: PPO | Source: Ambulatory Visit | Attending: Pulmonary Disease | Admitting: Pulmonary Disease

## 2016-01-05 DIAGNOSIS — R0602 Shortness of breath: Secondary | ICD-10-CM | POA: Diagnosis not present

## 2016-01-11 ENCOUNTER — Telehealth: Payer: Self-pay

## 2016-01-11 MED ORDER — METRONIDAZOLE 500 MG PO TABS: 500.0000 mg | ORAL_TABLET | Freq: Three times a day (TID) | ORAL | 0 refills | Status: AC

## 2016-01-11 MED ORDER — CIPROFLOXACIN HCL 500 MG PO TABS: 500.0000 mg | ORAL_TABLET | Freq: Two times a day (BID) | ORAL | 0 refills | Status: AC

## 2016-01-11 NOTE — Telephone Encounter (Signed)
Pt is pain in the lower right abd and goes across the bottom. She feels like it is diverticulitis acting up. She was on anti-bx for a cold but she is finish them. Please advise

## 2016-01-11 NOTE — Telephone Encounter (Signed)
I have sent in Cipro and Flagyl to start empirically now. Duration 7 days. She needs to call right away if pain does not improve in next 24 hours OR if pain worsens. We will then need to do a CT scan.  Offer non-urgent office visit with [place] in about 2 weeks. IF pain worsens, will need to have stat CT.

## 2016-01-11 NOTE — Telephone Encounter (Signed)
Pt is aware and she will be coming in on 01/31/16 @ 11:00

## 2016-01-13 ENCOUNTER — Ambulatory Visit (HOSPITAL_COMMUNITY)
Admission: RE | Admit: 2016-01-13 | Discharge: 2016-01-13 | Disposition: A | Discharge status: Home / Self Care | Payer: PPO | Source: Ambulatory Visit | Attending: Gastroenterology | Admitting: Gastroenterology

## 2016-01-13 ENCOUNTER — Other Ambulatory Visit: Payer: Self-pay

## 2016-01-13 ENCOUNTER — Telehealth: Payer: Self-pay | Admitting: Gastroenterology

## 2016-01-13 DIAGNOSIS — K5732 Diverticulitis of large intestine without perforation or abscess without bleeding: Secondary | ICD-10-CM

## 2016-01-13 DIAGNOSIS — K5730 Diverticulosis of large intestine without perforation or abscess without bleeding: Secondary | ICD-10-CM | POA: Insufficient documentation

## 2016-01-13 DIAGNOSIS — K760 Fatty (change of) liver, not elsewhere classified: Secondary | ICD-10-CM | POA: Diagnosis not present

## 2016-01-13 MED ORDER — IOPAMIDOL (ISOVUE-300) INJECTION 61%
INTRAVENOUS | Status: AC
  Administered 2016-01-13: 30 mL via ORAL
  Filled 2016-01-13: qty 30

## 2016-01-13 MED ORDER — IOPAMIDOL (ISOVUE-300) INJECTION 61%
100.0000 mL | Freq: Once | INTRAVENOUS | Status: AC | PRN
  Administered 2016-01-13: 100 mL via INTRAVENOUS

## 2016-01-13 NOTE — Telephone Encounter (Signed)
Anderson Malta from [facility] radiology called to let [facility] know that the patient's CT report was ready.

## 2016-01-13 NOTE — Telephone Encounter (Signed)
Received fax to call [employer]. When called [employer], was instructed to go to [employer] and download form. Completed new prior authorization form and faxed to [place].

## 2016-01-13 NOTE — Telephone Encounter (Signed)
Orders in and pt is aware to go on ahead over there now.

## 2016-01-13 NOTE — Telephone Encounter (Signed)
Needs stat CT abd/pelvis today. I will be leaving around noon, so the results need to be called to my cell number so I can follow-up on it.

## 2016-01-13 NOTE — Telephone Encounter (Signed)
Pt said her pain is no worse but it definitely is no better after 3 doses of antibiotics. She said her lower right side is still hurting when she moves any at all. As long as she is sitting still, it is OK. Please advise!

## 2016-01-13 NOTE — Telephone Encounter (Signed)
Pt is aware. Forwarding to Ginger and Tretha Sciara to schedule the CT.

## 2016-01-13 NOTE — Telephone Encounter (Signed)
[phone] PATIENT HAS TAKEN 3 DOSES OF HER ANTIBIOTIC AND STATES THAT SHE CAN NOT TELL MUCH DIFFERENCE, STILL HAVING PAIN IN LOWER RIGHT SIDE AND SOME DIARRHEA.  PLEASE ADVISE.   PAIN WHENEVER SHE MOVES.

## 2016-01-13 NOTE — Telephone Encounter (Signed)
PA info faxed to [facility].

## 2016-01-17 NOTE — Telephone Encounter (Signed)
[place] to follow-up on Prior Authorization for CT abd/pelvis with contrast. Intake specialist has info that was faxed. Stated they have a 14 day turn around time but she would submit info to build case.

## 2016-01-24 NOTE — Telephone Encounter (Signed)
Received fax. Authorization# [id number].

## 2016-01-31 ENCOUNTER — Encounter: Payer: Self-pay | Admitting: Gastroenterology

## 2016-01-31 ENCOUNTER — Ambulatory Visit (INDEPENDENT_AMBULATORY_CARE_PROVIDER_SITE_OTHER): Payer: PPO | Admitting: Gastroenterology

## 2016-01-31 ENCOUNTER — Encounter (HOSPITAL_COMMUNITY): Payer: Self-pay | Admitting: Hematology & Oncology

## 2016-01-31 VITALS — BP 175/84 | HR 89 | Temp 98.5°F | Ht 60.0 in | Wt 166.4 lb

## 2016-01-31 DIAGNOSIS — K5732 Diverticulitis of large intestine without perforation or abscess without bleeding: Secondary | ICD-10-CM

## 2016-01-31 MED ORDER — NYSTATIN-TRIAMCINOLONE 100000-0.1 UNIT/GM-% EX CREA
1.0000 | TOPICAL_CREAM | Freq: Two times a day (BID) | CUTANEOUS | 1 refills | Status: DC
Start: 2016-01-31 — End: 2017-08-03

## 2016-01-31 NOTE — Assessment & Plan Note (Signed)
History of diverticulitis Jan 2017 with recurrent symptoms and treated empirically with improvement in symptoms. CT ordered as well after no significant improvement but without evidence of diverticulitis. Doing well today. Obtain ifobt in next few weeks; otherwise, next colonoscopy due 2019.  As of note, appears to have mild yeast in left underarm. Nystatin provided for short-term. Follow-up with PCP if worsens.

## 2016-01-31 NOTE — Progress Notes (Signed)
Referring Provider: [name], MD Primary Care Physician:  [name], MD Primary GI: Dr. Oneida Alar   Chief Complaint  Patient presents with  . Diverticulitis    abd pain is better    HPI:   Amy Houston is a 71 y.o. female presenting today with a history of breast cancer, s/p bilateral mastectomy May 2014. Genetic testing revealed elevated risk for pancreatic and colorectal cancer. Colonoscopy 2014 with 2 small adenomas, diverticulosis in sigmoid, and due for surveillance in 2019. EGD in 2014 with proximal esophageal web and Schatzki's ring, with repeat again in July 2014. CT Jan 2017 with diverticulitis, completing Cipro and Flagyl. She recently called in to our office Jan 2018 with symptoms that seemed to mimic her prior episode. Started empirically on Cipro and Flagyl. As she did not improve after about 24 hours, stat CT was completed that actually showed no evidence of diverticulitis. Returns today to follow-up on this.   States RLQ pain is resolved. No fever or chills associated with the pain. Denies constipation. While on antibiotics, has softer/looser stool but now improved. No rectal bleeding. Good appetite.     Past Medical History:  Diagnosis Date  . Abnormal genetic test 04/09/2013   PALB2 and CHEK2 positivity   . Arthritis    "of the spine" (05/08/2012)  . Breast cancer ([place]) 1995   "left" (05/08/2012)  . Breast cancer, right breast, IDC, Multifocal 03/12/2012   Multifocal, 10 and 8 left breast   . Diabetes mellitus 1999   "dx; never on meds" (05/08/2012)  . Exertional shortness of breath   . GERD (gastroesophageal reflux disease)    tums  . H/O hiatal hernia   . Hemorrhoid   . History of blood transfusion    post child birth  . HTN (hypertension)   . IBS (irritable bowel syndrome)   . Osteopenia 12/11/2012   On Ca++ and Vit D. Bone density in April 2014.  Next bone density due in April 2016.  Marland Kitchen PONV (postoperative nausea and vomiting)   . PPD positive,  treated 1999  . Sleep apnea    "gone after I had OR" (05/08/2012)    Past Surgical History:  Procedure Laterality Date  . ABDOMINAL HYSTERECTOMY Bilateral 01/30/2013   Procedure: HYSTERECTOMY ABDOMINAL;  Surgeon: Melina Schools, MD;  Location: [facility];  Service: Gynecology;  Laterality: Bilateral;  . BREAST BIOPSY Left 1995  . BREAST BIOPSY Bilateral 03/2012   "one on the left; 2 on the right" (05/08/2012)  . BREAST LUMPECTOMY Left 1995  . CHOLECYSTECTOMY  1992  . COLONOSCOPY  12/06/93   Dr. Hayes:single diminutive polyp of the descending colon/extrernal hemorrhoids, benign path  . COLONOSCOPY  11/30/98   Dr Hayes:small rectal polyp/internal hemorrhoids, benign path  . COLONOSCOPY  11/26/2006   Dr. Hayes:sigmoid polyp/small interal hemorrhoids, adenomatous  . COLONOSCOPY WITH ESOPHAGOGASTRODUODENOSCOPY (EGD) N/A 07/01/2012   Dr. Oneida Alar: 2 small adenomas, sigmoid colon diverticula, surveillance in 2019  . DILATION AND CURETTAGE OF UTERUS  1968  . ESOPHAGEAL DILATION  07/01/2012   Dr. Oneida Alar: proximal esophageal web, Schatzki's ring at GE junction, s/p dilaiton. Mild non-erosive gastritis. Repeat EGD July 23, 2012.   . ESOPHAGOGASTRODUODENOSCOPY  1994   Dr. Cristina Gong: small hiatal hernia and Schatzki's ring widely patent, CLO test negative  . ESOPHAGOGASTRODUODENOSCOPY (EGD) WITH ESOPHAGEAL DILATION N/A 07/23/2012   Dr. Fields:proximal esophageal web v. radiation induced stricture/schatzki ring/small HH, s/p dilation  . MASTECTOMY COMPLETE / SIMPLE Left 05/08/2012  . MASTECTOMY COMPLETE /  SIMPLE W/ SENTINEL NODE BIOPSY Right 05/08/2012  . SALPINGOOPHORECTOMY Bilateral 01/30/2013   Procedure: SALPINGO OOPHORECTOMY;  Surgeon: Melina Schools, MD;  Location: [facility];  Service: Gynecology;  Laterality: Bilateral;  . SIMPLE MASTECTOMY WITH AXILLARY SENTINEL NODE BIOPSY Right 05/08/2012   Procedure: RIGHT TOTAL MASTECTOMY WITH AXILLARY SENTINEL NODE BIOPSY;  Surgeon: Haywood Lasso, MD;  Location: [facility];   Service: General;  Laterality: Right;  . SIMPLE MASTECTOMY WITH AXILLARY SENTINEL NODE BIOPSY Left 05/08/2012   Procedure: LEFT TOTAL MASTECTOMY;  Surgeon: Haywood Lasso, MD;  Location: [facility];  Service: General;  Laterality: Left;  . TONSILLECTOMY AND ADENOIDECTOMY  ~ 2008  . TUBAL LIGATION  1976  . UVULOPALATOPHARYNGOPLASTY, TONSILLECTOMY AND SEPTOPLASTY  ~ 2008    Current Outpatient Prescriptions  Medication Sig Dispense Refill  . Calcium Carbonate-Vitamin D (CALCIUM 600+D3 PO) Take 1 tablet by mouth 2 (two) times daily.    . cholecalciferol (VITAMIN D) 1000 UNITS tablet Take 2,000 Units by mouth daily.     Marland Kitchen exemestane (AROMASIN) 25 MG tablet TAKE (1) TABLET ONCE DAILY IN THE MORNING, AFTER BREAKFAST. 30 tablet 5  . lisinopril (PRINIVIL,ZESTRIL) 20 MG tablet Take 20 mg by mouth daily.    . metoprolol succinate (TOPROL-XL) 25 MG 24 hr tablet Take 25 mg by mouth daily.    . Multiple Vitamin (MULTIVITAMIN WITH MINERALS) TABS Take 1 tablet by mouth daily.    Marland Kitchen omeprazole (PRILOSEC) 20 MG capsule TAKE ONE CAPSULE BY MOUTH ONCE DAILY. 30 capsule 11  . venlafaxine (EFFEXOR) 50 MG tablet Take 50 mg by mouth 2 (two) times daily.     No current facility-administered medications for this visit.     Allergies as of 01/31/2016 - Review Complete 01/31/2016  Allergen Reaction Noted  . Penicillins Rash 08/18/2010    Family History  Problem Relation Age of Onset  . Lung cancer Mother 59    smoker  . Leukemia Father 86    Hairy Cell at 39, CLL at 24  . Lymphoma Father 34    Hodgkins lymphoma  . Lung cancer Sister 37  . Thyroid cancer Brother 46    Medullary  . Lymphoma Brother 60    Follicular lymphoma  . Breast cancer Maternal Aunt     mother's paternal half sister  . Lung cancer Maternal Uncle     mother's paternal half brother  . Lung cancer Maternal Grandmother     smoker  . Other Daughter     leiomyoma of esophagus  . Colon cancer Maternal Aunt     mother's maternal half  sister  . Cancer Paternal Uncle     oral cancer dx in his 26s  . Lung cancer Cousin     paternal cousin  . Breast cancer Cousin     paternal cousin died in her 63s    Social History   Social History  . Marital status: Married    Spouse name: N/A  . Number of children: 2  . Years of education: 12th grade   Occupational History  . 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 Smoking status: Never Smoker  . Smokeless tobacco: Never Used  . Alcohol use Yes     Comment: 05/08/2012 "margarita or glass of wine once or twice/yr"  . Drug use: No  . Sexual activity: No   Other Topics Concern  . None   Social History Narrative  . None  Review of Systems: As mentioned in HPI   Physical Exam: BP (!) 175/84   Pulse 89   Temp 98.5 F (36.9 C) (Oral)   Ht 5' (1.524 m)   Wt 166 lb 6.4 oz (75.5 kg)   BMI 32.50 kg/m  General:   Alert and oriented. No distress noted. Pleasant and cooperative.  Head:  Normocephalic and atraumatic. Eyes:  Conjuctiva clear without scleral icterus. Abdomen:  +BS, soft, non-tender and non-distended. No rebound or guarding. No HSM or masses noted. Extremities:  Without edema. Neurologic:  Alert and  oriented x4 Skin:  Erythema noted at fold under left underarm, appears consistent with possible yeast  Psych:  Alert and cooperative. Normal mood and affect.

## 2016-01-31 NOTE — Patient Instructions (Signed)
I have given you a stool test to take home and check your stool for blood. You can wait a few weeks before doing this. If positive, we will set you up for a colonoscopy. Otherwise, we will plan on a colonoscopy in 2019.   We will see you back in 1 year unless something changes.

## 2016-02-01 NOTE — Progress Notes (Signed)
cc'ed to pcp °

## 2016-03-03 ENCOUNTER — Other Ambulatory Visit (HOSPITAL_COMMUNITY): Payer: Self-pay | Admitting: Oncology

## 2016-03-14 ENCOUNTER — Ambulatory Visit (INDEPENDENT_AMBULATORY_CARE_PROVIDER_SITE_OTHER): Payer: PPO | Admitting: Gastroenterology

## 2016-03-14 ENCOUNTER — Encounter: Payer: Self-pay | Admitting: Gastroenterology

## 2016-03-14 ENCOUNTER — Telehealth: Payer: Self-pay

## 2016-03-14 DIAGNOSIS — K625 Hemorrhage of anus and rectum: Secondary | ICD-10-CM | POA: Diagnosis not present

## 2016-03-14 MED ORDER — HYDROCORTISONE 2.5 % RE CREA: 1.0000 "application " | TOPICAL_CREAM | Freq: Two times a day (BID) | RECTAL | 1 refills | Status: DC

## 2016-03-14 NOTE — Telephone Encounter (Signed)
Pt called office and said she has been passing bright red blood since the weekend. She had a hard stool when it started, but has had loose stool since then. She has kit for iFOBT, but hasn't completed it yet because her stool is loose. Advised pt I would send msg to AB for advice.

## 2016-03-14 NOTE — Telephone Encounter (Signed)
Called pt. She has been having large amount of bleeding. Had abdominal pain that was relieved with having BM. Had bleeding yesterday. OV made for today at 2:30pm with AB.

## 2016-03-14 NOTE — Telephone Encounter (Signed)
Sorry, sending to [place].

## 2016-03-14 NOTE — Telephone Encounter (Signed)
Is it a large amount? If just streaks, likely benign and it appears it started after hard stool. Any abdominal pain? Don't complete the ifobt right now. If needed, I will send in Anusol. If large amounts, needs evaluation.

## 2016-03-14 NOTE — Patient Instructions (Signed)
We will call you to schedule a colonoscopy.  I sent a cream to the pharmacy to use per rectum twice a day for 7 days, then take a break. You can repeat if needed.

## 2016-03-14 NOTE — Progress Notes (Addendum)
REVIEWED-NO ADDITIONAL RECOMMENDATIONS.  Referring Provider: [name], MD Primary Care Physician:  [name], MD Primary GI: Dr. Oneida Alar   Chief Complaint  Patient presents with  . Rectal Bleeding    bright red blood    HPI:   Amy Houston is a 71 y.o. female presenting today with a history of diverticulitis in Jan 2017, and empirically treated Jan 2018 but CT findings at that time showed no evidence of diverticulitis. Last colonoscopy in 2014 with 2 small adenomas, diverticulosis of sigmoid, and due for surveillance in 2019. She has had genetic testing that revealed elevated risk for pancreatic and colorectal cancer. Presents today due to new onset rectal bleeding following an episode of constipation.   Episode Saturday, 2 on Sunday, 1 yesterday. Going to the beach on Thursday. No rectal pain except if a large bowel movement. Had a stool that "popped out" on Saturday, followed by loose stool but none today. Sometimes [employer] will spur her right on, which is what she had on Saturday. Yesterday was more solid. Last bit of rectal bleeding was yesterday. On tissue and in toilet, bright red. Sometimes uncomfortable in rectum. Declining rectal exam today.   Past Medical History:  Diagnosis Date  . Abnormal genetic test 04/09/2013   PALB2 and CHEK2 positivity   . Arthritis    "of the spine" (05/08/2012)  . Breast cancer ([place]) 1995   "left" (05/08/2012)  . Breast cancer, right breast, IDC, Multifocal 03/12/2012   Multifocal, 10 and 8 left breast   . Diabetes mellitus 1999   "dx; never on meds" (05/08/2012)  . Exertional shortness of breath   . GERD (gastroesophageal reflux disease)    tums  . H/O hiatal hernia   . Hemorrhoid   . History of blood transfusion    post child birth  . HTN (hypertension)   . IBS (irritable bowel syndrome)   . Osteopenia 12/11/2012   On Ca++ and Vit D. Bone density in April 2014.  Next bone density due in April 2016.  Marland Kitchen PONV (postoperative nausea  and vomiting)   . PPD positive, treated 1999  . Sleep apnea    "gone after I had OR" (05/08/2012)    Past Surgical History:  Procedure Laterality Date  . ABDOMINAL HYSTERECTOMY Bilateral 01/30/2013   Procedure: HYSTERECTOMY ABDOMINAL;  Surgeon: Melina Schools, MD;  Location: [facility];  Service: Gynecology;  Laterality: Bilateral;  . BREAST BIOPSY Left 1995  . BREAST BIOPSY Bilateral 03/2012   "one on the left; 2 on the right" (05/08/2012)  . BREAST LUMPECTOMY Left 1995  . CHOLECYSTECTOMY  1992  . COLONOSCOPY  12/06/93   Dr. Hayes:single diminutive polyp of the descending colon/extrernal hemorrhoids, benign path  . COLONOSCOPY  11/30/98   Dr Hayes:small rectal polyp/internal hemorrhoids, benign path  . COLONOSCOPY  11/26/2006   Dr. Hayes:sigmoid polyp/small interal hemorrhoids, adenomatous  . COLONOSCOPY WITH ESOPHAGOGASTRODUODENOSCOPY (EGD) N/A 07/01/2012   Dr. Oneida Alar: 2 small adenomas, sigmoid colon diverticula, surveillance in 2019  . DILATION AND CURETTAGE OF UTERUS  1968  . ESOPHAGEAL DILATION  07/01/2012   Dr. Oneida Alar: proximal esophageal web, Schatzki's ring at GE junction, s/p dilaiton. Mild non-erosive gastritis. Repeat EGD July 23, 2012.   . ESOPHAGOGASTRODUODENOSCOPY  1994   Dr. Cristina Gong: small hiatal hernia and Schatzki's ring widely patent, CLO test negative  . ESOPHAGOGASTRODUODENOSCOPY (EGD) WITH ESOPHAGEAL DILATION N/A 07/23/2012   Dr. Fields:proximal esophageal web v. radiation induced stricture/schatzki ring/small HH, s/p dilation  . MASTECTOMY COMPLETE / SIMPLE  Left 05/08/2012  . MASTECTOMY COMPLETE / SIMPLE W/ SENTINEL NODE BIOPSY Right 05/08/2012  . SALPINGOOPHORECTOMY Bilateral 01/30/2013   Procedure: SALPINGO OOPHORECTOMY;  Surgeon: Melina Schools, MD;  Location: [facility];  Service: Gynecology;  Laterality: Bilateral;  . SIMPLE MASTECTOMY WITH AXILLARY SENTINEL NODE BIOPSY Right 05/08/2012   Procedure: RIGHT TOTAL MASTECTOMY WITH AXILLARY SENTINEL NODE BIOPSY;  Surgeon: Haywood Lasso, MD;  Location: [facility];  Service: General;  Laterality: Right;  . SIMPLE MASTECTOMY WITH AXILLARY SENTINEL NODE BIOPSY Left 05/08/2012   Procedure: LEFT TOTAL MASTECTOMY;  Surgeon: Haywood Lasso, MD;  Location: [facility];  Service: General;  Laterality: Left;  . TONSILLECTOMY AND ADENOIDECTOMY  ~ 2008  . TUBAL LIGATION  1976  . UVULOPALATOPHARYNGOPLASTY, TONSILLECTOMY AND SEPTOPLASTY  ~ 2008    Current Outpatient Prescriptions  Medication Sig Dispense Refill  . Calcium Carbonate-Vitamin D (CALCIUM 600+D3 PO) Take 1 tablet by mouth 2 (two) times daily.    . cholecalciferol (VITAMIN D) 1000 UNITS tablet Take 2,000 Units by mouth daily.     Marland Kitchen exemestane (AROMASIN) 25 MG tablet TAKE (1) TABLET ONCE DAILY IN THE MORNING, AFTER BREAKFAST. 30 tablet 5  . lisinopril (PRINIVIL,ZESTRIL) 20 MG tablet Take 20 mg by mouth daily.    . metoprolol succinate (TOPROL-XL) 25 MG 24 hr tablet Take 25 mg by mouth daily.    . Multiple Vitamin (MULTIVITAMIN WITH MINERALS) TABS Take 1 tablet by mouth daily.    Marland Kitchen nystatin-triamcinolone (MYCOLOG II) cream Apply 1 application topically 2 (two) times daily. For 1 week. 30 g 1  . omeprazole (PRILOSEC) 20 MG capsule TAKE ONE CAPSULE BY MOUTH ONCE DAILY. 30 capsule 11  . venlafaxine (EFFEXOR) 50 MG tablet Take 50 mg by mouth 2 (two) times daily.     No current facility-administered medications for this visit.     Allergies as of 03/14/2016 - Review Complete 03/14/2016  Allergen Reaction Noted  . Penicillins Rash 08/18/2010    Family History  Problem Relation Age of Onset  . Lung cancer Mother 89    smoker  . Leukemia Father 20    Hairy Cell at 46, CLL at 69  . Lymphoma Father 66    Hodgkins lymphoma  . Lung cancer Sister 73  . Thyroid cancer Brother 46    Medullary  . Lymphoma Brother 60    Follicular lymphoma  . Breast cancer Maternal Aunt     mother's paternal half sister  . Lung cancer Maternal Uncle     mother's paternal half brother  . Lung  cancer Maternal Grandmother     smoker  . Other Daughter     leiomyoma of esophagus  . Colon cancer Maternal Aunt     mother's maternal half sister  . Cancer Paternal Uncle     oral cancer dx in his 95s  . Lung cancer Cousin     paternal cousin  . Breast cancer Cousin     paternal cousin died in her 81s    Social History   Social History  . Marital status: Married    Spouse name: N/A  . Number of children: 2  . Years of education: 12th grade   Occupational History  . 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 Smoking status: Never Smoker  . Smokeless tobacco: Never Used  . Alcohol use Yes     Comment: 05/08/2012 "margarita or glass of wine once or twice/yr"  .  Drug use: No  . Sexual activity: No   Other Topics Concern  . None   Social History Narrative  . None    Review of Systems: As mentioned in HPI   Physical Exam: BP 139/82   Pulse 83   Temp 98.2 F (36.8 C) (Oral)   Ht 5' (1.524 m)   Wt 166 lb 9.6 oz (75.6 kg)   BMI 32.54 kg/m  General:   Alert and oriented. No distress noted. Pleasant and cooperative.  Head:  Normocephalic and atraumatic. Eyes:  Conjuctiva clear without scleral icterus. Mouth:  Oral mucosa pink and moist. Heart:  S1, S2 present with systolic murmur Abdomen:  +BS, soft, non-tender and non-distended. No rebound or guarding. No HSM or masses noted. Rectal: declined  Msk:  Symmetrical without gross deformities. Normal posture. Extremities:  Without edema. Neurologic:  Alert and  oriented x4;  grossly normal neurologically. Psych:  Alert and cooperative. Normal mood and affect.

## 2016-03-14 NOTE — Assessment & Plan Note (Signed)
71 year old female with recent onset of rectal bleeding that appears self-limiting in the setting of constipation. Baseline bowel habits fluctuate between constipation and loose stool. Declining rectal exam. Last colonoscopy in 2014 and due again in 2019 due to history of adenomas and genetic testing revealing elevated risk for pancreatic and colorectal cancer (history of breast cancer). With new onset rectal bleeding, although likely benign, will proceed with early interval colonoscopy in a few weeks. In interim, proctozone sent to pharmacy.  Proceed with colonoscopy with Dr. Oneida Alar in the near future. The risks, benefits, and alternatives have been discussed in detail with the patient. They state understanding and desire to proceed.

## 2016-03-15 ENCOUNTER — Other Ambulatory Visit: Payer: Self-pay

## 2016-03-15 ENCOUNTER — Telehealth: Payer: Self-pay

## 2016-03-15 DIAGNOSIS — K625 Hemorrhage of anus and rectum: Secondary | ICD-10-CM

## 2016-03-15 MED ORDER — PEG 3350-KCL-NA BICARB-NACL 420 G PO SOLR: 4000.0000 mL | ORAL | 0 refills | Status: DC

## 2016-03-15 NOTE — Telephone Encounter (Signed)
PA info for TCS faxed to [facility]. 

## 2016-03-15 NOTE — Progress Notes (Signed)
cc'ed to pcp °

## 2016-03-15 NOTE — Telephone Encounter (Signed)
Called pt to schedule TCS with [facility]. TCS scheduled for 03/30/16 at 8:30am. Instructions mailed to pt. Rx for prep sent to [facility]. Orders entered.

## 2016-03-23 ENCOUNTER — Telehealth: Payer: Self-pay

## 2016-03-23 NOTE — Telephone Encounter (Signed)
Pt called to see about her instructions. She is said that she could not drink her prep until 6:30 on 03/29/16, and she is was questioning about having to get up at 3:30 to drink the second have of the prep. I told her that is how the split  Preps are done.

## 2016-03-23 NOTE — Telephone Encounter (Signed)
Noted  

## 2016-03-27 NOTE — Telephone Encounter (Signed)
Received fax from [facility]. TCS approved. Authorization number: [id number]. Effective 03/16/16-06/14/16.

## 2016-03-29 ENCOUNTER — Encounter (HOSPITAL_COMMUNITY): Payer: Self-pay | Admitting: *Deleted

## 2016-03-30 ENCOUNTER — Encounter (HOSPITAL_COMMUNITY): Payer: Self-pay | Admitting: *Deleted

## 2016-03-30 ENCOUNTER — Ambulatory Visit (HOSPITAL_COMMUNITY)
Admission: RE | Admit: 2016-03-30 | Discharge: 2016-03-30 | Disposition: A | Discharge status: Home / Self Care | Payer: PPO | Source: Ambulatory Visit | Attending: Gastroenterology | Admitting: Gastroenterology

## 2016-03-30 ENCOUNTER — Encounter (HOSPITAL_COMMUNITY)
Admission: RE | Disposition: A | Discharge status: Home / Self Care | Payer: Self-pay | Source: Ambulatory Visit | Attending: Gastroenterology

## 2016-03-30 DIAGNOSIS — Z9012 Acquired absence of left breast and nipple: Secondary | ICD-10-CM | POA: Insufficient documentation

## 2016-03-30 DIAGNOSIS — D128 Benign neoplasm of rectum: Secondary | ICD-10-CM | POA: Diagnosis not present

## 2016-03-30 DIAGNOSIS — Q439 Congenital malformation of intestine, unspecified: Secondary | ICD-10-CM | POA: Insufficient documentation

## 2016-03-30 DIAGNOSIS — K648 Other hemorrhoids: Secondary | ICD-10-CM | POA: Insufficient documentation

## 2016-03-30 DIAGNOSIS — K219 Gastro-esophageal reflux disease without esophagitis: Secondary | ICD-10-CM | POA: Diagnosis not present

## 2016-03-30 DIAGNOSIS — I10 Essential (primary) hypertension: Secondary | ICD-10-CM | POA: Diagnosis not present

## 2016-03-30 DIAGNOSIS — K5730 Diverticulosis of large intestine without perforation or abscess without bleeding: Secondary | ICD-10-CM | POA: Diagnosis not present

## 2016-03-30 DIAGNOSIS — Z79899 Other long term (current) drug therapy: Secondary | ICD-10-CM | POA: Insufficient documentation

## 2016-03-30 DIAGNOSIS — Z853 Personal history of malignant neoplasm of breast: Secondary | ICD-10-CM | POA: Diagnosis not present

## 2016-03-30 DIAGNOSIS — E119 Type 2 diabetes mellitus without complications: Secondary | ICD-10-CM | POA: Diagnosis not present

## 2016-03-30 DIAGNOSIS — K921 Melena: Secondary | ICD-10-CM | POA: Diagnosis not present

## 2016-03-30 DIAGNOSIS — K625 Hemorrhage of anus and rectum: Secondary | ICD-10-CM

## 2016-03-30 DIAGNOSIS — Z800 Family history of malignant neoplasm of digestive organs: Secondary | ICD-10-CM | POA: Insufficient documentation

## 2016-03-30 DIAGNOSIS — Z79811 Long term (current) use of aromatase inhibitors: Secondary | ICD-10-CM | POA: Diagnosis not present

## 2016-03-30 HISTORY — PX: COLONOSCOPY: SHX5424

## 2016-03-30 SURGERY — COLONOSCOPY
Anesthesia: Moderate Sedation

## 2016-03-30 MED ORDER — MIDAZOLAM HCL 5 MG/5ML IJ SOLN
INTRAMUSCULAR | Status: AC
  Filled 2016-03-30: qty 10

## 2016-03-30 MED ORDER — ONDANSETRON HCL 4 MG/2ML IJ SOLN
INTRAMUSCULAR | Status: DC | PRN
  Administered 2016-03-30: 4 mg via INTRAVENOUS

## 2016-03-30 MED ORDER — SIMETHICONE 40 MG/0.6ML PO SUSP
ORAL | Status: DC | PRN
  Administered 2016-03-30: 2.5 mL

## 2016-03-30 MED ORDER — MEPERIDINE HCL 100 MG/ML IJ SOLN
INTRAMUSCULAR | Status: AC
  Filled 2016-03-30: qty 2

## 2016-03-30 MED ORDER — MEPERIDINE HCL 100 MG/ML IJ SOLN
INTRAMUSCULAR | Status: DC | PRN
  Administered 2016-03-30: 25 mg
  Administered 2016-03-30: 50 mg

## 2016-03-30 MED ORDER — SODIUM CHLORIDE 0.9 % IV SOLN
INTRAVENOUS | Status: DC
  Administered 2016-03-30: 1000 mL via INTRAVENOUS

## 2016-03-30 MED ORDER — MIDAZOLAM HCL 5 MG/5ML IJ SOLN
INTRAMUSCULAR | Status: DC | PRN
  Administered 2016-03-30 (×2): 2 mg via INTRAVENOUS

## 2016-03-30 MED ORDER — ONDANSETRON HCL 4 MG/2ML IJ SOLN
INTRAMUSCULAR | Status: AC
  Filled 2016-03-30: qty 2

## 2016-03-30 NOTE — Discharge Instructions (Addendum)
You have LARGE linternal hemorrhoids, WHICH ARE THE CAUSE FOR YOUR RECTAL BLEEDING. YOU HAVE diverticulosis IN YOUR LEFT COLON. You had ONE SMALL POLYP REMOVED. THE LAST PART OF YOUR SMALL BOWEL IS NORMAL.   DRINK WATER TO KEEP YOUR URINE LIGHT YELLOW.  FOLLOW A HIGH FIBER DIET. AVOID ITEMS THAT CAUSE BLOATING. See info below.  CONTINUE YOUR WEIGHT LOSS EFFORTS. LOSE TEN POUNDS.  USE PREPARATION H FOUR TIMES  A DAY IF NEEDED TO RELIEVE RECTAL PAIN/PRESSURE/BLEEDING. RESTRICT USE OF HYDROCORTISONE SUPPOSITORIES/CREAMS TO 3-4 TIMES A YEAR. IF CREAMS AND SUPPOSITORIES CANNOT CONTROL YOUR SYMPTOMS, YOU SHOULD CONSIDER SURGERY TO FIX YOUR HEMORRHOIDS.  YOUR BIOPSY RESULTS WILL BE AVAILABLE IN MY CHART  APR 2 AND MY OFFICE WILL CONTACT YOU IN 10-14 DAYS WITH YOUR RESULTS.   Next colonoscopy in 5 years.  Colonoscopy Care After Read the instructions outlined below and refer to this sheet in the next week. These discharge instructions provide you with general information on caring for yourself after you leave the hospital. While your treatment has been planned according to the most current medical practices available, unavoidable complications occasionally occur. If you have any problems or questions after discharge, call DR. Ott Zimmerle, [phone].  ACTIVITY  You may resume your regular activity, but move at a slower pace for the next 24 hours.   Take frequent rest periods for the next 24 hours.   Walking will help get rid of the air and reduce the bloated feeling in your belly (abdomen).   No driving for 24 hours (because of the medicine (anesthesia) used during the test).   You may shower.   Do not sign any important legal documents or operate any machinery for 24 hours (because of the anesthesia used during the test).    NUTRITION  Drink plenty of fluids.   You may resume your normal diet as instructed by your doctor.   Begin with a light meal and progress to your normal diet. Heavy  or fried foods are harder to digest and may make you feel sick to your stomach (nauseated).   Avoid alcoholic beverages for 24 hours or as instructed.    MEDICATIONS  You may resume your normal medications.   WHAT YOU CAN EXPECT TODAY  Some feelings of bloating in the abdomen.   Passage of more gas than usual.   Spotting of blood in your stool or on the toilet paper  .  IF YOU HAD POLYPS REMOVED DURING THE COLONOSCOPY:  Eat a soft diet IF YOU HAVE NAUSEA, BLOATING, ABDOMINAL PAIN, OR VOMITING.    FINDING OUT THE RESULTS OF YOUR TEST Not all test results are available during your visit. DR. Oneida Alar WILL CALL YOU WITHIN 14 DAYS OF YOUR PROCEDUE WITH YOUR RESULTS. Do not assume everything is normal if you have not heard from DR. Yaviel Kloster, CALL HER OFFICE AT [phone].  SEEK IMMEDIATE MEDICAL ATTENTION AND CALL THE OFFICE: [phone] IF:  You have more than a spotting of blood in your stool.   Your belly is swollen (abdominal distention).   You are nauseated or vomiting.   You have a temperature over 101F.   You have abdominal pain or discomfort that is severe or gets worse throughout the day.  High-Fiber Diet A high-fiber diet changes your normal diet to include more whole grains, legumes, fruits, and vegetables. Changes in the diet involve replacing refined carbohydrates with unrefined foods. The calorie level of the diet is essentially unchanged. The Dietary Reference Intake (recommended amount) for  adult males is 38 grams per day. For adult females, it is 25 grams per day. Pregnant and lactating women should consume 28 grams of fiber per day. Fiber is the intact part of a plant that is not broken down during digestion. Functional fiber is fiber that has been isolated from the plant to provide a beneficial effect in the body. PURPOSE  Increase stool bulk.   Ease and regulate bowel movements.   Lower cholesterol.  REDUCE RISK OF COLON CANCER  INDICATIONS THAT YOU  NEED MORE FIBER  Constipation and hemorrhoids.   Uncomplicated diverticulosis (intestine condition) and irritable bowel syndrome.   Weight management.   As a protective measure against hardening of the arteries (atherosclerosis), diabetes, and cancer.   GUIDELINES FOR INCREASING FIBER IN THE DIET  Start adding fiber to the diet slowly. A gradual increase of about 5 more grams (2 slices of whole-wheat bread, 2 servings of most fruits or vegetables, or 1 bowl of high-fiber cereal) per day is best. Too rapid an increase in fiber may result in constipation, flatulence, and bloating.   Drink enough water and fluids to keep your urine clear or pale yellow. Water, juice, or caffeine-free drinks are recommended. Not drinking enough fluid may cause constipation.   Eat a variety of high-fiber foods rather than one type of fiber.   Try to increase your intake of fiber through using high-fiber foods rather than fiber pills or supplements that contain small amounts of fiber.   The goal is to change the types of food eaten. Do not supplement your present diet with high-fiber foods, but replace foods in your present diet.   INCLUDE A VARIETY OF FIBER SOURCES  Replace refined and processed grains with whole grains, canned fruits with fresh fruits, and incorporate other fiber sources. White rice, white breads, and most bakery goods contain little or no fiber.   Brown whole-grain rice, buckwheat oats, and many fruits and vegetables are all good sources of fiber. These include: broccoli, Brussels sprouts, cabbage, cauliflower, beets, sweet potatoes, white potatoes (skin on), carrots, tomatoes, eggplant, squash, berries, fresh fruits, and dried fruits.   Cereals appear to be the richest source of fiber. Cereal fiber is found in whole grains and bran. Bran is the fiber-rich outer coat of cereal grain, which is largely removed in refining. In whole-grain cereals, the bran remains. In breakfast cereals, the  largest amount of fiber is found in those with "bran" in their names. The fiber content is sometimes indicated on the label.   You may need to include additional fruits and vegetables each day.   In baking, for 1 cup white flour, you may use the following substitutions:   1 cup whole-wheat flour minus 2 tablespoons.   1/2 cup white flour plus 1/2 cup whole-wheat flour.   Polyps, Colon  A polyp is extra tissue that grows inside your body. Colon polyps grow in the large intestine. The large intestine, also called the colon, is part of your digestive system. It is a long, hollow tube at the end of your digestive tract where your body makes and stores stool. Most polyps are not dangerous. They are benign. This means they are not cancerous. But over time, some types of polyps can turn into cancer. Polyps that are smaller than a pea are usually not harmful. But larger polyps could someday become or may already be cancerous. To be safe, doctors remove all polyps and test them.   PREVENTION There is not one sure way  to prevent polyps. You might be able to lower your risk of getting them if you:  Eat more fruits and vegetables and less fatty food.   Do not smoke.   Avoid alcohol.   Exercise every day.   Lose weight if you are overweight.   Eating more calcium and folate can also lower your risk of getting polyps. Some foods that are rich in calcium are milk, cheese, and broccoli. Some foods that are rich in folate are chickpeas, kidney beans, and spinach.    Diverticulosis Diverticulosis is a common condition that develops when small pouches (diverticula) form in the wall of the colon. The risk of diverticulosis increases with age. It happens more often in people who eat a low-fiber diet. Most individuals with diverticulosis have no symptoms. Those individuals with symptoms usually experience belly (abdominal) pain, constipation, or loose stools (diarrhea).  HOME CARE INSTRUCTIONS  Increase  the amount of fiber in your diet as directed by your caregiver or dietician. This may reduce symptoms of diverticulosis.   Drink at least 6 to 8 glasses of water each day to prevent constipation.   Try not to strain when you have a bowel movement.   Avoiding nuts and seeds to prevent complications is NOT NECESSARY.     FOODS HAVING HIGH FIBER CONTENT INCLUDE:  Fruits. Apple, peach, pear, tangerine, raisins, prunes.   Vegetables. Brussels sprouts, asparagus, broccoli, cabbage, carrot, cauliflower, romaine lettuce, spinach, summer squash, tomato, winter squash, zucchini.   Starchy Vegetables. Baked beans, kidney beans, lima beans, split peas, lentils, potatoes (with skin).   Grains. Whole wheat bread, brown rice, bran flake cereal, plain oatmeal, white rice, shredded wheat, bran muffins.    SEEK IMMEDIATE MEDICAL CARE IF:  You develop increasing pain or severe bloating.   You have an oral temperature above 101F.   You develop vomiting or bowel movements that are bloody or black.   Hemorrhoids Hemorrhoids are dilated (enlarged) veins around the rectum. Sometimes clots will form in the veins. This makes them swollen and painful. These are called thrombosed hemorrhoids. Causes of hemorrhoids include:  Constipation.   Straining to have a bowel movement.   HEAVY LIFTING  HOME CARE INSTRUCTIONS  Eat a well balanced diet and drink 6 to 8 glasses of water every day to avoid constipation. You may also use a bulk laxative.   Avoid straining to have bowel movements.   Keep anal area dry and clean.   Do not use a donut shaped pillow or sit on the toilet for long periods. This increases blood pooling and pain.   Move your bowels when your body has the urge; this will require less straining and will decrease pain and pressure.

## 2016-03-30 NOTE — H&P (Signed)
Primary Care Physician:  [name], MD Primary Gastroenterologist:  Dr. Oneida Alar  Pre-Procedure History & Physical: HPI:  Amy Houston is a 71 y.o. female here for  BRBPR.  Past Medical History:  Diagnosis Date  . Abnormal genetic test 04/09/2013   PALB2 and CHEK2 positivity   . Arthritis    "of the spine" (05/08/2012)  . Breast cancer ([place]) 1995   "left" (05/08/2012)  . Breast cancer, right breast, IDC, Multifocal 03/12/2012   Multifocal, 10 and 8 left breast   . Diabetes mellitus 1999   "dx; never on meds" (05/08/2012)  . Exertional shortness of breath   . GERD (gastroesophageal reflux disease)    tums  . H/O hiatal hernia   . Hemorrhoid   . History of blood transfusion    post child birth  . HTN (hypertension)   . IBS (irritable bowel syndrome)   . Osteopenia 12/11/2012   On Ca++ and Vit D. Bone density in April 2014.  Next bone density due in April 2016.  Marland Kitchen PONV (postoperative nausea and vomiting)   . PPD positive, treated 1999    Past Surgical History:  Procedure Laterality Date  . ABDOMINAL HYSTERECTOMY Bilateral 01/30/2013   Procedure: HYSTERECTOMY ABDOMINAL;  Surgeon: Melina Schools, MD;  Location: [facility];  Service: Gynecology;  Laterality: Bilateral;  . BREAST BIOPSY Left 1995  . BREAST BIOPSY Bilateral 03/2012   "one on the left; 2 on the right" (05/08/2012)  . BREAST LUMPECTOMY Left 1995  . CHOLECYSTECTOMY  1992  . COLONOSCOPY  12/06/93   Dr. Hayes:single diminutive polyp of the descending colon/extrernal hemorrhoids, benign path  . COLONOSCOPY  11/30/98   Dr Hayes:small rectal polyp/internal hemorrhoids, benign path  . COLONOSCOPY  11/26/2006   Dr. Hayes:sigmoid polyp/small interal hemorrhoids, adenomatous  . COLONOSCOPY WITH ESOPHAGOGASTRODUODENOSCOPY (EGD) N/A 07/01/2012   Dr. Oneida Alar: 2 small adenomas, sigmoid colon diverticula, surveillance in 2019  . DILATION AND CURETTAGE OF UTERUS  1968  . ESOPHAGEAL DILATION  07/01/2012   Dr. Oneida Alar: proximal  esophageal web, Schatzki's ring at GE junction, s/p dilaiton. Mild non-erosive gastritis. Repeat EGD July 23, 2012.   . ESOPHAGOGASTRODUODENOSCOPY  1994   Dr. Cristina Gong: small hiatal hernia and Schatzki's ring widely patent, CLO test negative  . ESOPHAGOGASTRODUODENOSCOPY (EGD) WITH ESOPHAGEAL DILATION N/A 07/23/2012   Dr. Eduard Penkala:proximal esophageal web v. radiation induced stricture/schatzki ring/small HH, s/p dilation  . MASTECTOMY COMPLETE / SIMPLE Left 05/08/2012  . MASTECTOMY COMPLETE / SIMPLE W/ SENTINEL NODE BIOPSY Right 05/08/2012  . SALPINGOOPHORECTOMY Bilateral 01/30/2013   Procedure: SALPINGO OOPHORECTOMY;  Surgeon: Melina Schools, MD;  Location: [facility];  Service: Gynecology;  Laterality: Bilateral;  . SIMPLE MASTECTOMY WITH AXILLARY SENTINEL NODE BIOPSY Right 05/08/2012   Procedure: RIGHT TOTAL MASTECTOMY WITH AXILLARY SENTINEL NODE BIOPSY;  Surgeon: Haywood Lasso, MD;  Location: [facility];  Service: General;  Laterality: Right;  . SIMPLE MASTECTOMY WITH AXILLARY SENTINEL NODE BIOPSY Left 05/08/2012   Procedure: LEFT TOTAL MASTECTOMY;  Surgeon: Haywood Lasso, MD;  Location: [facility];  Service: General;  Laterality: Left;  . TONSILLECTOMY AND ADENOIDECTOMY  ~ 2008  . TUBAL LIGATION  1976  . UVULOPALATOPHARYNGOPLASTY, TONSILLECTOMY AND SEPTOPLASTY  ~ 2008    Prior to Admission medications   Medication Sig Start Date End Date Taking? Authorizing Provider  CALCIUM-VITAMIN D-VITAMIN K PO Take 1 tablet by mouth 2 (two) times daily.   Yes Historical Provider, MD  Cholecalciferol (VITAMIN D) 2000 units CAPS Take 2,000 Units by mouth at  bedtime.   Yes Historical Provider, MD  exemestane (AROMASIN) 25 MG tablet TAKE (1) TABLET ONCE DAILY IN THE MORNING, AFTER BREAKFAST. 03/03/16  Yes Baird Cancer, PA-C  lisinopril (PRINIVIL,ZESTRIL) 20 MG tablet Take 20 mg by mouth daily.   Yes Historical Provider, MD  metoprolol succinate (TOPROL-XL) 25 MG 24 hr tablet Take 25 mg by mouth daily.   Yes Historical  Provider, MD  Multiple Vitamin (MULTIVITAMIN WITH MINERALS) TABS Take 1 tablet by mouth at bedtime.    Yes Historical Provider, MD  omeprazole (PRILOSEC) 20 MG capsule TAKE ONE CAPSULE BY MOUTH ONCE DAILY. Patient taking differently: TAKE ONE CAPSULE BY MOUTH ONCE DAILY BEFORE BREAKFAST 11/19/15  Yes Mahala Menghini, PA-C  polyethylene glycol-electrolytes (TRILYTE) 420 g solution Take 4,000 mLs by mouth as directed. 03/15/16  Yes Danie Binder, MD  venlafaxine (EFFEXOR) 50 MG tablet Take 50 mg by mouth 2 (two) times daily.   Yes Historical Provider, MD  acetaminophen (TYLENOL) 500 MG tablet Take 1,000 mg by mouth daily as needed for moderate pain or headache.    Historical Provider, MD  Carboxymethylcellul-Glycerin (LUBRICATING EYE DROPS OP) Apply 1 drop to eye daily as needed (dry eyes).    Historical Provider, MD  hydrocortisone (PROCTOZONE-HC) 2.5 % rectal cream Place 1 application rectally 2 (two) times daily. For 7 days Patient not taking: Reported on 03/28/2016 03/14/16   Annitta Needs, NP  nystatin-triamcinolone [facility] II) cream Apply 1 application topically 2 (two) times daily. For 1 week. Patient not taking: Reported on 03/28/2016 01/31/16   Annitta Needs, NP    Allergies as of 03/15/2016 - Review Complete 03/14/2016  Allergen Reaction Noted  . Penicillins Rash 08/18/2010    Family History  Problem Relation Age of Onset  . Lung cancer Mother 27    smoker  . Leukemia Father 43    Hairy Cell at 32, CLL at 66  . Lymphoma Father 58    Hodgkins lymphoma  . Lung cancer Sister 72  . Thyroid cancer Brother 46    Medullary  . Lymphoma Brother 60    Follicular lymphoma  . Breast cancer Maternal Aunt     mother's paternal half sister  . Lung cancer Maternal Uncle     mother's paternal half brother  . Lung cancer Maternal Grandmother     smoker  . Other Daughter     leiomyoma of esophagus  . Colon cancer Maternal Aunt     mother's maternal half sister  . Cancer Paternal Uncle      oral cancer dx in his 62s  . Lung cancer Cousin     paternal cousin  . Breast cancer Cousin     paternal cousin died in her 67s    Social History   Social History  . Marital status: Married    Spouse name: N/A  . Number of children: 2  . Years of education: 12th grade   Occupational History  . 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 Smoking status: Never Smoker  . Smokeless tobacco: Never Used  . Alcohol use Yes     Comment: 05/08/2012 "margarita or glass of wine once or twice/yr"  . Drug use: No  . Sexual activity: No   Other Topics Concern  . Not on file   Social History Narrative  . No narrative on file    Review of Systems: See HPI, otherwise negative ROS   Physical Exam:  BP (!) 127/59   Pulse 77   Temp 98.6 F (37 C) (Oral)   Resp 11   Ht 5' (1.524 m)   Wt 161 lb (73 kg)   SpO2 97%   BMI 31.44 kg/m  General:   Alert,  pleasant and cooperative in NAD Head:  Normocephalic and atraumatic. Neck:  Supple; Lungs:  Clear throughout to auscultation.    Heart:  Regular rate and rhythm. Abdomen:  Soft, nontender and nondistended. Normal bowel sounds, without guarding, and without rebound.   Neurologic:  Alert and  oriented x4;  grossly normal neurologically.  Impression/Plan:    BRBPR  PLAN: TCS TODAY. DISCUSSED PROCEDURE, BENEFITS, & RISKS: < 1% chance of medication reaction, bleeding, perforation, or rupture of spleen/liver.

## 2016-03-30 NOTE — Op Note (Signed)
[facility] Patient Name: Amy Houston Procedure Date: 03/30/2016 8:02 AM MRN: [id number] Date of Birth: 02-10-1945 Attending MD: Barney Drain , MD CSN: [id number] Age: 71 Admit Type: Outpatient Procedure:                Colonoscopy WITH COLD SNARE POLYPECTOMY Indications:              Hematochezia-PERSONAL Hx: POLYPS/IH Providers:                Barney Drain, MD, Charlyne Petrin RN, RN, Rosina Lowenstein, RN Referring MD:             Jasper Loser. Luan Pulling, MD Medicines:                Ondansetron 4 mg IV, Meperidine 75 mg IV, Midazolam                            4 mg IV Complications:            No immediate complications. Estimated Blood Loss:     Estimated blood loss was minimal. Procedure:                Pre-Anesthesia Assessment:                           - Prior to the procedure, a History and Physical                            was performed, and patient medications and                            allergies were reviewed. The patient's tolerance of                            previous anesthesia was also reviewed. The risks                            and benefits of the procedure and the sedation                            options and risks were discussed with the patient.                            All questions were answered, and informed consent                            was obtained. Prior Anticoagulants: The patient has                            taken no previous anticoagulant or antiplatelet                            agents. ASA Grade Assessment: II - A patient with  mild systemic disease. After reviewing the risks                            and benefits, the patient was deemed in                            satisfactory condition to undergo the procedure.                            After obtaining informed consent, the colonoscope                            was passed under direct vision. Throughout the              procedure, the patient's blood pressure, pulse, and                            oxygen saturations were monitored continuously. The                            Colonoscope was introduced through the anus and                            advanced to the 5 cm into the ileum. The                            colonoscopy was somewhat difficult due to a                            tortuous colon. The patient tolerated the procedure                            well. The quality of the bowel preparation was                            excellent. The terminal ileum, ileocecal valve,                            appendiceal orifice, and rectum were photographed. Scope In: 8:51:26 AM Scope Out: 9:09:30 AM Scope Withdrawal Time: 0 hours 14 minutes 4 seconds  Total Procedure Duration: 0 hours 18 minutes 4 seconds  Findings:      The terminal ileum appeared normal.      A 4 mm polyp was found in the rectum. The polyp was sessile. The polyp       was removed with a cold snare. Resection and retrieval were complete.      Multiple small and large-mouthed diverticula were found in the       recto-sigmoid colon and sigmoid colon.      Non-bleeding internal hemorrhoids were found during retroflexion. The       hemorrhoids were moderate. Impression:               - The examined portion of the ileum was normal.                           -  One 4 mm polyp in the rectum, removed with a cold                            snare. Resected and retrieved.                           - Diverticulosis in the recto-sigmoid colon and in                            the sigmoid colon.                           - RECTAL BLEEDING DUE TO internal hemorrhoids. Moderate Sedation:      Moderate (conscious) sedation was administered by the endoscopy nurse       and supervised by the endoscopist. The following parameters were       monitored: oxygen saturation, heart rate, blood pressure, and response       to care. Total physician  intraservice time was 27 minutes. Recommendation:           - High fiber diet.                           - Continue present medications.                           - Await pathology results.                           - Repeat colonoscopy in 5 years for surveillance.                           - Patient has a contact number available for                            emergencies. The signs and symptoms of potential                            delayed complications were discussed with the                            patient. Return to normal activities tomorrow.                            Written discharge instructions were provided to the                            patient. Procedure Code(s):        --- Professional ---                           [phone], Colonoscopy, flexible; with removal of                            tumor(s), polyp(s), or other lesion(s) by snare  technique                           [phone], Moderate sedation services provided by the                            same physician or other qualified health care                            professional performing the diagnostic or                            therapeutic service that the sedation supports,                            requiring the presence of an independent trained                            observer to assist in the monitoring of the                            patient's level of consciousness and physiological                            status; initial 15 minutes of intraservice time,                            patient age 39 years or older                           [phone], Moderate sedation services; each additional                            15 minutes intraservice time Diagnosis Code(s):        --- Professional ---                           K64.8, Other hemorrhoids                           K62.1, Rectal polyp                           K92.1, Melena (includes Hematochezia)                            K57.30, Diverticulosis of large intestine without                            perforation or abscess without bleeding CPT copyright 2016 American Medical Association. All rights reserved. The codes documented in this report are preliminary and upon coder review may  be revised to meet current compliance requirements. Barney Drain, MD Barney Drain, MD 03/30/2016 9:25:16 AM This report has been signed electronically. Number of Addenda: 0

## 2016-04-06 ENCOUNTER — Encounter (HOSPITAL_COMMUNITY): Payer: Self-pay | Admitting: Gastroenterology

## 2016-04-13 ENCOUNTER — Other Ambulatory Visit (HOSPITAL_COMMUNITY): Payer: Self-pay | Admitting: Oncology

## 2016-04-13 DIAGNOSIS — R898 Other abnormal findings in specimens from other organs, systems and tissues: Secondary | ICD-10-CM

## 2016-04-13 DIAGNOSIS — C50911 Malignant neoplasm of unspecified site of right female breast: Secondary | ICD-10-CM

## 2016-04-13 NOTE — Progress Notes (Signed)
ON RECALL  °

## 2016-04-14 ENCOUNTER — Telehealth (HOSPITAL_COMMUNITY): Payer: Self-pay

## 2016-04-14 NOTE — Telephone Encounter (Signed)
-----   Message from Baird Cancer, PA-C sent at 04/13/2016  4:01 PM EDT ----- Order is in and I sent message to Amy TK ----- Message ----- From: Darlina Guys, LPN Sent: 05/12/2583   4:44 PM To: Baird Cancer, PA-C  She wants to know when she can have her breast MRI. Last was done on 06/14/15. I think she wants to get it scheduled before her follow up on 06/26/16.  MB

## 2016-04-14 NOTE — Telephone Encounter (Signed)
Notified patient who said Amy has already made her appt.

## 2016-04-20 ENCOUNTER — Ambulatory Visit (HOSPITAL_COMMUNITY)
Admission: RE | Admit: 2016-04-20 | Discharge: 2016-04-20 | Disposition: A | Discharge status: Home / Self Care | Payer: PPO | Source: Ambulatory Visit | Attending: Hematology & Oncology | Admitting: Hematology & Oncology

## 2016-04-20 DIAGNOSIS — M85851 Other specified disorders of bone density and structure, right thigh: Secondary | ICD-10-CM | POA: Insufficient documentation

## 2016-04-20 DIAGNOSIS — Z79899 Other long term (current) drug therapy: Secondary | ICD-10-CM | POA: Insufficient documentation

## 2016-04-20 DIAGNOSIS — Z853 Personal history of malignant neoplasm of breast: Secondary | ICD-10-CM | POA: Diagnosis not present

## 2016-05-02 ENCOUNTER — Other Ambulatory Visit (HOSPITAL_COMMUNITY): Payer: Self-pay | Admitting: Oncology

## 2016-05-02 ENCOUNTER — Telehealth (HOSPITAL_COMMUNITY): Payer: Self-pay

## 2016-05-02 NOTE — Telephone Encounter (Signed)
Patient would like to talk with Tom concerning her bone density. She has questions. Gershon Mussel can call her or she can stop by up here at cancer center.

## 2016-06-14 ENCOUNTER — Ambulatory Visit (HOSPITAL_COMMUNITY)
Admission: RE | Admit: 2016-06-14 | Discharge: 2016-06-14 | Disposition: A | Discharge status: Home / Self Care | Payer: PPO | Source: Ambulatory Visit | Attending: Oncology | Admitting: Oncology

## 2016-06-14 DIAGNOSIS — Z9013 Acquired absence of bilateral breasts and nipples: Secondary | ICD-10-CM | POA: Diagnosis not present

## 2016-06-14 DIAGNOSIS — Z853 Personal history of malignant neoplasm of breast: Secondary | ICD-10-CM | POA: Diagnosis not present

## 2016-06-14 DIAGNOSIS — C50911 Malignant neoplasm of unspecified site of right female breast: Secondary | ICD-10-CM | POA: Insufficient documentation

## 2016-06-14 DIAGNOSIS — R898 Other abnormal findings in specimens from other organs, systems and tissues: Secondary | ICD-10-CM | POA: Insufficient documentation

## 2016-06-14 LAB — POCT I-STAT CREATININE: Creatinine, Ser: 0.9 mg/dL (ref 0.44–1.00)

## 2016-06-14 MED ORDER — GADOBENATE DIMEGLUMINE 529 MG/ML IV SOLN
15.0000 mL | Freq: Once | INTRAVENOUS | Status: AC | PRN
  Administered 2016-06-14: 15 mL via INTRAVENOUS

## 2016-06-26 ENCOUNTER — Ambulatory Visit (HOSPITAL_COMMUNITY): Payer: PPO

## 2016-06-26 ENCOUNTER — Encounter (HOSPITAL_COMMUNITY): Payer: Self-pay

## 2016-06-26 ENCOUNTER — Encounter (HOSPITAL_COMMUNITY): Payer: PPO

## 2016-06-26 ENCOUNTER — Encounter (HOSPITAL_COMMUNITY): Payer: PPO | Attending: Oncology

## 2016-06-26 ENCOUNTER — Encounter (HOSPITAL_BASED_OUTPATIENT_CLINIC_OR_DEPARTMENT_OTHER): Payer: PPO | Admitting: Oncology

## 2016-06-26 VITALS — BP 158/63 | HR 75 | Temp 98.6°F | Resp 16 | Ht 60.0 in | Wt 168.4 lb

## 2016-06-26 DIAGNOSIS — C50011 Malignant neoplasm of nipple and areola, right female breast: Secondary | ICD-10-CM

## 2016-06-26 DIAGNOSIS — Z170 Estrogen receptor positive status [ER+]: Secondary | ICD-10-CM

## 2016-06-26 DIAGNOSIS — M8580 Other specified disorders of bone density and structure, unspecified site: Secondary | ICD-10-CM

## 2016-06-26 DIAGNOSIS — Z79811 Long term (current) use of aromatase inhibitors: Secondary | ICD-10-CM | POA: Diagnosis not present

## 2016-06-26 DIAGNOSIS — C50911 Malignant neoplasm of unspecified site of right female breast: Secondary | ICD-10-CM

## 2016-06-26 LAB — CBC WITH DIFFERENTIAL/PLATELET
BASOS ABS: 0 10*3/uL (ref 0.0–0.1)
BASOS PCT: 0 %
EOS ABS: 0 10*3/uL (ref 0.0–0.7)
EOS PCT: 1 %
HCT: 38.2 % (ref 36.0–46.0)
Hemoglobin: 12.7 g/dL (ref 12.0–15.0)
Lymphocytes Relative: 36 %
Lymphs Abs: 2.4 10*3/uL (ref 0.7–4.0)
MCH: 31.4 pg (ref 26.0–34.0)
MCHC: 33.2 g/dL (ref 30.0–36.0)
MCV: 94.6 fL (ref 78.0–100.0)
Monocytes Absolute: 0.4 10*3/uL (ref 0.1–1.0)
Monocytes Relative: 6 %
Neutro Abs: 3.7 10*3/uL (ref 1.7–7.7)
Neutrophils Relative %: 57 %
PLATELETS: 258 10*3/uL (ref 150–400)
RBC: 4.04 MIL/uL (ref 3.87–5.11)
RDW: 12.4 % (ref 11.5–15.5)
WBC: 6.5 10*3/uL (ref 4.0–10.5)

## 2016-06-26 LAB — COMPREHENSIVE METABOLIC PANEL
ALT: 42 U/L (ref 14–54)
AST: 37 U/L (ref 15–41)
Albumin: 4.1 g/dL (ref 3.5–5.0)
Alkaline Phosphatase: 35 U/L — ABNORMAL LOW (ref 38–126)
Anion gap: 8 (ref 5–15)
BUN: 17 mg/dL (ref 6–20)
CHLORIDE: 103 mmol/L (ref 101–111)
CO2: 27 mmol/L (ref 22–32)
CREATININE: 0.95 mg/dL (ref 0.44–1.00)
Calcium: 9.8 mg/dL (ref 8.9–10.3)
GFR calc Af Amer: >60 mL/min (ref >60)
GFR calc non Af Amer: 59 mL/min — ABNORMAL LOW (ref >60)
Glucose, Bld: 157 mg/dL — ABNORMAL HIGH (ref 65–99)
POTASSIUM: 3.9 mmol/L (ref 3.5–5.1)
SODIUM: 138 mmol/L (ref 135–145)
Total Bilirubin: 0.6 mg/dL (ref 0.3–1.2)
Total Protein: 7.5 g/dL (ref 6.5–8.1)

## 2016-06-26 MED ORDER — DENOSUMAB 60 MG/ML ~~LOC~~ SOLN
60.0000 mg | Freq: Once | SUBCUTANEOUS | Status: AC
  Administered 2016-06-26: 60 mg via SUBCUTANEOUS
  Filled 2016-06-26: qty 1

## 2016-06-26 NOTE — Progress Notes (Signed)
Amy Du, MD [place] [address]  Malignant neoplasm of nipple of right breast in female, estrogen receptor positive ([place]) - Plan: Hemoglobin A1c, MR BREAST BILATERAL W WO CONTRAST, DG Chest 2 View, CBC with Differential, Comprehensive metabolic panel  CURRENT THERAPY: Aromasin daily beginning on 06/03/2012 and she will take this lifelong due to increased recurrence rate with PALB2 and CHEK2 positivity. Osteopenia, calcium plus vitamin D. Prolia therapy  INTERVAL HISTORY: Amy Houston 71 y.o. female returns for  regular  visit for followup of right sided breast cancer,GRADE II, 1.2 cm invasive ductal carcinoma, ER +99%, PR +72%, grade 2, LV I positive, Ki-67 marker high at 60%, but 4 sentinel lymph nodes negative for metastatic disease. HER-2/neu nonamplified. S/P Bilateral mastectomy on 05/08/2012 by Dr. Osborn Coho. Her Oncotype DX score was 14 giving her recurrence rate of 9% at 5 years of hormonal therapy. With that in mind she was not considered a candidate for sytemic chemotherapy. Now on Aromasin beginning on 06/03/2012 and she will take this lifelong due to her PALB2 And CHEK 2 positivity. Additionally, she is S/P bilateral salpingo-oophorectomy on 01/30/2013. 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 followed by radiation therapy. She was not given tamoxifen. There was some question of Megace one tablet a day but she is not sure of the duration. This may have been for hot flashes which she remembers were severe. She was eventually placed on Effexor for hot flashes but is now on the Effexor for depression.   Amy Houston is unaccompanied. I personally reviewed and went over laboratory studies with the patient. Patient states that overall she feels well. She recently had a bilateral MRI of  the breast on 06/14/16 which was negative for malignancy. She had a DEXA scan in April 2018 which showed osteopenia, T score -2.1 in the femur neck. She continues to take her Aromasin daily with minimal side effects. She does have hot flashes but they are tolerable. She denies any bone pain, unexplained weight loss, fatigue, chest pain, shortness breath, abdominal pain, focal weakness. 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    Breast cancer, right breast, IDC, Multifocal   03/19/2012 Initial Diagnosis    Breast cancer, right breast, IDC, Multifocal      05/08/2012 Surgery    Bilateral simple mastectomies by Dr. Margot Chimes      06/03/2012 -  Chemotherapy    Aromatase inhibitor.  PALB2 and CHEK2+ and therefore would benefit from lifelong AI therapy.      01/30/2013 Surgery    Laparotomy, minimal lysis of adhesions, abdominal hysterectomy, bilateral salpingo-oophorectomy.       Oncologically, the patient denies any complaints and ROS questioning is negative.  Past Medical History:  Diagnosis Date  . Abnormal genetic test 04/09/2013   PALB2 and CHEK2 positivity   . Arthritis    "of the spine" (05/08/2012)  . Breast cancer ([place]) 1995   "left" (05/08/2012)  . Breast cancer, right breast, IDC, Multifocal 03/12/2012   Multifocal, 10 and 8 left breast   . Diabetes mellitus 1999   "dx; never on meds" (05/08/2012)  . Exertional shortness of breath   . GERD (gastroesophageal reflux disease)    tums  . H/O hiatal hernia   . Hemorrhoid   . History of blood transfusion  post child birth  . HTN (hypertension)   . IBS (irritable bowel syndrome)   . Osteopenia 12/11/2012   On Ca++ and Vit D. Bone density in April 2014.  Next bone density due in April 2016.  [place] PONV (postoperative nausea and vomiting)   . PPD positive, treated 1999    has Popliteal cyst; Breast cancer, right breast, IDC, Multifocal; Hx Left breast cancer, UOQ, IDC, receptor negative; GERD (gastroesophageal reflux disease);  Adenomatous polyps; Thyroid nodule; Osteopenia; Abnormal genetic test; Acquired trigger finger; Genetic testing; LLQ abdominal pain; Diverticulitis of colon; and Rectal bleeding on her problem list.     is allergic to penicillins.  Amy Houston had no medications administered during this visit.  Past Surgical History:  Procedure Laterality Date  . ABDOMINAL HYSTERECTOMY Bilateral 01/30/2013   Procedure: HYSTERECTOMY ABDOMINAL;  Surgeon: Melina Schools, MD;  Location: [facility];  Service: Gynecology;  Laterality: Bilateral;  . BREAST BIOPSY Left 1995  . BREAST BIOPSY Bilateral 03/2012   "one on the left; 2 on the right" (05/08/2012)  . BREAST LUMPECTOMY Left 1995  . CHOLECYSTECTOMY  1992  . COLONOSCOPY  12/06/93   Dr. Hayes:single diminutive polyp of the descending colon/extrernal hemorrhoids, benign path  . COLONOSCOPY  11/30/98   Dr Hayes:small rectal polyp/internal hemorrhoids, benign path  . COLONOSCOPY  11/26/2006   Dr. Hayes:sigmoid polyp/small interal hemorrhoids, adenomatous  . COLONOSCOPY N/A 03/30/2016   Procedure: COLONOSCOPY;  Surgeon: Danie Binder, MD;  [facility];  Service: Endoscopy;  Laterality: N/A;  8:30am  . COLONOSCOPY WITH ESOPHAGOGASTRODUODENOSCOPY (EGD) N/A 07/01/2012   Dr. Oneida Alar: 2 small adenomas, sigmoid colon diverticula, surveillance in 2019  . DILATION AND CURETTAGE OF UTERUS  1968  . ESOPHAGEAL DILATION  07/01/2012   Dr. Oneida Alar: proximal esophageal web, Schatzki's ring at GE junction, s/p dilaiton. Mild non-erosive gastritis. Repeat EGD July 23, 2012.   . ESOPHAGOGASTRODUODENOSCOPY  1994   Dr. Cristina Gong: small hiatal hernia and Schatzki's ring widely patent, CLO test negative  . ESOPHAGOGASTRODUODENOSCOPY (EGD) WITH ESOPHAGEAL DILATION N/A 07/23/2012   Dr. Fields:proximal esophageal web v. radiation induced stricture/schatzki ring/small HH, s/p dilation  . MASTECTOMY COMPLETE / SIMPLE Left 05/08/2012  . MASTECTOMY COMPLETE / SIMPLE W/ SENTINEL NODE BIOPSY  Right 05/08/2012  . SALPINGOOPHORECTOMY Bilateral 01/30/2013   Procedure: SALPINGO OOPHORECTOMY;  Surgeon: Melina Schools, MD;  Location: [facility];  Service: Gynecology;  Laterality: Bilateral;  . SIMPLE MASTECTOMY WITH AXILLARY SENTINEL NODE BIOPSY Right 05/08/2012   Procedure: RIGHT TOTAL MASTECTOMY WITH AXILLARY SENTINEL NODE BIOPSY;  Surgeon: Haywood Lasso, MD;  Location: [facility];  Service: General;  Laterality: Right;  . SIMPLE MASTECTOMY WITH AXILLARY SENTINEL NODE BIOPSY Left 05/08/2012   Procedure: LEFT TOTAL MASTECTOMY;  Surgeon: Haywood Lasso, MD;  Location: [facility];  Service: General;  Laterality: Left;  . TONSILLECTOMY AND ADENOIDECTOMY  ~ 2008  . TUBAL LIGATION  1976  . UVULOPALATOPHARYNGOPLASTY, TONSILLECTOMY AND SEPTOPLASTY  ~ 2008   FAMILY HISTORY UPDATE: 2 daughters, both received her cancer gene, youngest daughter diagnosed with breast cancer , oldest regularly seen at [facility] for breast cancer Sister received one of her 2 abnormality genes however, she is completely normal   Review of Systems  Constitutional: Negative.  Negative for malaise/fatigue.  HENT: Negative.   Eyes: Negative.   Respiratory: Negative.   Cardiovascular: Negative.   Gastrointestinal: Negative.   Genitourinary: Negative.   Skin: Negative.   Neurological: Negative.   Endo/Heme/Allergies: Negative.   Psychiatric/Behavioral: Negative.  All other systems reviewed and are negative. 14 point review of systems was performed and is negative except as detailed under history of present illness and above  PHYSICAL EXAMINATION  ECOG PERFORMANCE STATUS: 0 - Asymptomatic  Vitals:   06/26/16 1344  BP: (!) 158/63  Pulse: 75  Resp: 16  Temp: 98.6 F (37 C)     Physical Exam  Constitutional: She is oriented to person, place, and time and well-developed, well-nourished, and in no distress.  HENT:  Head: Normocephalic and atraumatic.  Eyes: EOM are normal. Pupils are equal, round, and reactive to  light. No scleral icterus.  Neck: Normal range of motion. Neck supple.  Cardiovascular: Normal rate, regular rhythm and normal heart sounds.   Pulmonary/Chest: Breath sounds normal. No respiratory distress. She has no wheezes. She has no rales.  Abdominal: Soft. Bowel sounds are normal. She exhibits no distension. There is no tenderness. There is no rebound.  Musculoskeletal: Normal range of motion. She exhibits no edema.  Lymphadenopathy:    She has no cervical adenopathy.  Neurological: She is alert and oriented to person, place, and time. No cranial nerve deficit. Gait normal.  Skin: Skin is warm and dry.  Multiple moles and freckles over her body  Psychiatric: Mood, memory, affect and judgment normal.  Nursing note and vitals reviewed.   LABORATORY DATA: I have reviewed the data as listed.  CBC    Component Value Date/Time   WBC 6.5 06/26/2016 1310   RBC 4.04 06/26/2016 1310   HGB 12.7 06/26/2016 1310   HCT 38.2 06/26/2016 1310   PLT 258 06/26/2016 1310   MCV 94.6 06/26/2016 1310   MCH 31.4 06/26/2016 1310   MCHC 33.2 06/26/2016 1310   RDW 12.4 06/26/2016 1310   LYMPHSABS 2.4 06/26/2016 1310   MONOABS 0.4 06/26/2016 1310   EOSABS 0.0 06/26/2016 1310   BASOSABS 0.0 06/26/2016 1310      Chemistry      Component Value Date/Time   NA 138 06/26/2016 1310   K 3.9 06/26/2016 1310   CL 103 06/26/2016 1310   CO2 27 06/26/2016 1310   BUN 17 06/26/2016 1310   CREATININE 0.95 06/26/2016 1310      Component Value Date/Time   CALCIUM 9.8 06/26/2016 1310   ALKPHOS 35 (L) 06/26/2016 1310   AST 37 06/26/2016 1310   ALT 42 06/26/2016 1310   BILITOT 0.6 06/26/2016 1310      RADIOGRAPHIC STUDIES: I have reviewed the above documentation for accuracy and completeness, and I agree with the above. Study Result   CLINICAL DATA:  Pulmonary nodules.  EXAM: CT CHEST WITHOUT CONTRAST  TECHNIQUE: Multidetector CT imaging of the chest was performed following the standard  protocol without IV contrast.  COMPARISON:  12/15/2014.  FINDINGS: Cardiovascular: Coronary artery calcification. Heart size normal. No pericardial effusion.  Mediastinum/Nodes: No pathologically enlarged mediastinal or axillary lymph nodes. Surgical clips in the axillary regions bilaterally. Hilar regions are difficult to definitively evaluate without IV contrast. Esophagus is grossly unremarkable.  Lungs/Pleura: Image quality is somewhat degraded by respiratory motion. 4 mm posterior left upper lobe nodule is unchanged. Lungs are otherwise clear. No pleural fluid. Airway is unremarkable.  Upper Abdomen: Visualized portions of the liver, glands, kidneys, spleen, pancreas, stomach and bowel are grossly unremarkable. Cholecystectomy. No upper abdominal adenopathy.  Musculoskeletal: No worrisome lytic or sclerotic lesions. Degenerative changes are seen in the spine. Bilateral mastectomies.  IMPRESSION: 1. 4 mm left upper lobe nodule is unchanged, indicative of a benign  lesion. 2. Coronary artery calcification.   Electronically Signed   By: Lorin Picket M.D.   On: 12/15/2015 16:03    ASSESSMENT:  1. Right sided breast cancer,GRADE II, 1.2 cm invasive ductal carcinoma, ER +99%, PR +72%, grade 2, LV I positive, Ki-67 marker high at 60%, but 4 sentinel lymph nodes negative for metastatic disease. HER-2/neu nonamplified. S/P Bilateral mastectomy on 05/08/2012 by Dr. Osborn Coho. Her Oncotype DX score was 14 giving her recurrence rate of 9% at 5 years of hormonal therapy. With that in mind she was not considered a candidate for sytemic chemotherapy. Now on Aromasin beginning on 06/03/2012 and she will take this lifelong due to her PALB2 And CHEK 2 positivity. S/P bilateral salpingo-oophorectomy on 01/30/2013. 2. PALB2 + mutation  3. CHEK2 + mutation  4. left-sided breast cancer 1996, treated with lumpectomy followed by chemotherapy consisting of CMF for 8 cycles. followed by  radiation therapy. She was not given tamoxifen. There was some question of Megace one tablet a day but she is not sure of the duration. This may have been for hot flashes which she remembers were severe. She was eventually placed on Effexor for hot flashes but is now on the Effexor for depression.  5. Vitamin D deficiency on replacement  6. Osteopenia, next bone density due in April 2020. On Ca++ and Vit D. Prolia therapy  7. Pulmonary Nodules stable on imaging for greater than 2 years therefore presumed benign based on ongoing stability 8. Lymphedema   Patient Active Problem List   Diagnosis Date Noted  . Rectal bleeding 03/14/2016  . Diverticulitis of colon 03/08/2015  . LLQ abdominal pain 01/22/2015  . Genetic testing 09/10/2014  . Acquired trigger finger 05/06/2013  . Abnormal genetic test 04/09/2013  . Osteopenia 12/11/2012  . Thyroid nodule 06/21/2012  . GERD (gastroesophageal reflux disease) 06/03/2012  . Adenomatous polyps 06/03/2012  . Breast cancer, right breast, IDC, Multifocal 03/12/2012  . Popliteal cyst 08/18/2010  . Hx Left breast cancer, UOQ, IDC, receptor negative 11/08/1993    PLAN:  -Clinically NED on exam today. She will be on lifelong endocrine therapy given her genetic mutations which places her at a higher risk. Continue Aromasin, calcium-vitamin D. -I have reviewed patient's MRI of the breast and DEXA scan in detail with her today. I have also reviewed her lab work in detail with her. -Proceed with Prolia today. She is continuing with Prolia every 6 months. -Patient wishes to get a chest x-ray for lung cancer screening, since she has a family history of lung cancer in her mother and her sister. We'll get this chest x-ray in December 2018. -Repeat MRI of bilateral breast with an without contrast in one year. -Patient wishes to extend out her follow-ups to yearly. Therefore we'll see her back in one year with CBC, CMP and MRI of bilateral breasts prior to her  next visit. She knows to come see [place] sooner should she feel any axillary lymphadenopathy or subcutaneous nodules on her chest wall bilaterally.  Orders Placed This Encounter  Procedures  . MR BREAST BILATERAL W WO CONTRAST    Standing Status:   Future    Standing Expiration Date:   12/26/2017    Order Specific Question:   If indicated for the ordered procedure, I authorize the administration of contrast media per Radiology protocol    Answer:   Yes    Order Specific Question:   Reason for Exam (SYMPTOM  OR DIAGNOSIS REQUIRED)    Answer:  multifocal breast cancer screening    Order Specific Question:   What is the patient's sedation requirement?    Answer:   No Sedation    Order Specific Question:   Does the patient have a pacemaker or implanted devices?    Answer:   No    Order Specific Question:   Preferred imaging location?    Answer:   [facility] (table limit-350lbs)    Order Specific Question:   Radiology Contrast Protocol - do NOT remove file path    Answer:   _0 [email or web address]  . DG Chest 2 View    Standing Status:   Future    Standing Expiration Date:   08/26/2017    Order Specific Question:   Reason for Exam (SYMPTOM  OR DIAGNOSIS REQUIRED)    Answer:   surveillance for lung cancer, strong family history of lung ca    Order Specific Question:   Preferred imaging location?    Answer:   [facility]    Order Specific Question:   Radiology Contrast Protocol - do NOT remove file path    Answer:   _1 [email or web address].pdf  . Hemoglobin A1c    Standing Status:   Future    Standing Expiration Date:   06/26/2017  . CBC with Differential    Standing Status:   Future    Standing Expiration Date:   06/26/2017  . Comprehensive metabolic panel    Standing Status:   Future    Standing Expiration Date:   06/26/2017      THERAPY PLAN:  Encouraged compliance with Aromasin which we recommend at this time she  continue lifelong due to [place] and CHEK2 positivity. We will follow NCCN guidelines pertaining to surveillance.   NCCN guidelines recommends the following surveillance for invasive breast cancer:  A. History and Physical exam every 4-6 months for 5 years and then every 12 months.  B. Mammography every 12 months  C. Women on Tamoxifen: annual gynecologic assessment every 12 months if uterus is present.  D. Women on aromatase inhibitor or who experience ovarian failure secondary to treatment should have monitoring of bone health with a bone mineral density determination at baseline and periodically thereafter.  E. Assess and encourage adherence to adjuvant endocrine therapy.  F. Evidence suggests that active lifestyle and achieving and maintaining an ideal body weight (20-25 BMI) may lead to optimal breast cancer outcomes.  All questions were answered. The patient knows to call the clinic with any problems, questions or concerns. We can certainly see the patient much sooner if necessary.  Twana First, MD

## 2016-06-26 NOTE — Patient Instructions (Signed)
[place] at [facility] Discharge Instructions  RECOMMENDATIONS MADE BY THE CONSULTANT AND ANY TEST RESULTS WILL BE SENT TO YOUR REFERRING PHYSICIAN.  Received Prolia injection today. Follow-up as scheduled. Call clinic for any questions or concerns  Thank you for choosing [place] at [facility] to provide your oncology and hematology care.  To afford each patient quality time with our provider, please arrive at least 15 minutes before your scheduled appointment time.    If you have a lab appointment with the [facility] please come in thru the  Main Entrance and check in at the main information desk  You need to re-schedule your appointment should you arrive 10 or more minutes late.  We strive to give you quality time with our providers, and arriving late affects you and other patients whose appointments are after yours.  Also, if you no show three or more times for appointments you may be dismissed from the clinic at the providers discretion.     Again, thank you for choosing [facility].  Our hope is that these requests will decrease the amount of time that you wait before being seen by our physicians.       _____________________________________________________________  Should you have questions after your visit to [facility], please contact our office at [phone] between the hours of 8:30 a.m. and 4:30 p.m.  Voicemails left after 4:30 p.m. will not be returned until the following business day.  For prescription refill requests, have your pharmacy contact our office.       Resources For Cancer Patients and their Caregivers ? American Cancer Society: Can assist with transportation, wigs, general needs, runs Look Good Feel Better.        (810)068-7882 ? Cancer Care: Provides financial assistance, online support groups, medication/co-pay assistance.  1-800-813-HOPE 754 198 3175) ? [place] Assists [place] cancer patients and their families through emotional , educational and financial support.  (661)400-2519 ? [place] DSS Where to apply for food stamps, Medicaid and utility assistance. 6503060828 ? [employer]: Transportation to medical appointments. (617)152-5611 ? Social Security Administration: May apply for disability if have a Stage IV cancer. 8630328098 808-461-8289 ? [employer], Disability and Transit Services: Assists with nutrition, care and transit needs. [place] Support Programs: @10RELATIVEDAYS @ > Cancer Support Group  2nd Tuesday of the month 1pm-2pm, Journey Room  > Creative Journey  3rd Tuesday of the month 1130am-1pm, Journey Room  > Look Good Feel Better  1st Wednesday of the month 10am-12 noon, Journey Room (Call [place] to register [phone])

## 2016-06-26 NOTE — Progress Notes (Signed)
Amy Houston tolerated Prolia injection well without complaints or incident. Calcium 9.8 Pt denied any tooth,jaw or leg pain prior to administering this injection. Pt continues to take her Calcium as prescribed. Pt discharged self ambulatory in satisfactory condition

## 2016-06-27 LAB — HEMOGLOBIN A1C
Hgb A1c MFr Bld: 7.1 % — ABNORMAL HIGH (ref 4.8–5.6)
Mean Plasma Glucose: 157 mg/dL

## 2016-07-28 DIAGNOSIS — H5203 Hypermetropia, bilateral: Secondary | ICD-10-CM | POA: Diagnosis not present

## 2016-07-28 DIAGNOSIS — H52203 Unspecified astigmatism, bilateral: Secondary | ICD-10-CM | POA: Diagnosis not present

## 2016-07-28 DIAGNOSIS — H524 Presbyopia: Secondary | ICD-10-CM | POA: Diagnosis not present

## 2016-07-28 DIAGNOSIS — H2513 Age-related nuclear cataract, bilateral: Secondary | ICD-10-CM | POA: Diagnosis not present

## 2016-09-05 ENCOUNTER — Other Ambulatory Visit (HOSPITAL_COMMUNITY): Payer: Self-pay | Admitting: Oncology

## 2016-10-13 DIAGNOSIS — C44511 Basal cell carcinoma of skin of breast: Secondary | ICD-10-CM | POA: Diagnosis not present

## 2016-10-13 DIAGNOSIS — C44519 Basal cell carcinoma of skin of other part of trunk: Secondary | ICD-10-CM | POA: Diagnosis not present

## 2016-10-13 DIAGNOSIS — L304 Erythema intertrigo: Secondary | ICD-10-CM | POA: Diagnosis not present

## 2016-10-13 DIAGNOSIS — L821 Other seborrheic keratosis: Secondary | ICD-10-CM | POA: Diagnosis not present

## 2016-10-19 ENCOUNTER — Other Ambulatory Visit (HOSPITAL_COMMUNITY): Payer: Self-pay | Admitting: Oncology

## 2016-10-19 DIAGNOSIS — Z170 Estrogen receptor positive status [ER+]: Principal | ICD-10-CM

## 2016-10-19 DIAGNOSIS — C50111 Malignant neoplasm of central portion of right female breast: Secondary | ICD-10-CM

## 2016-10-19 DIAGNOSIS — R918 Other nonspecific abnormal finding of lung field: Secondary | ICD-10-CM

## 2016-12-06 ENCOUNTER — Encounter: Payer: Self-pay | Admitting: Gastroenterology

## 2016-12-11 ENCOUNTER — Other Ambulatory Visit: Payer: Self-pay | Admitting: Gastroenterology

## 2016-12-27 ENCOUNTER — Ambulatory Visit (HOSPITAL_COMMUNITY): Payer: PPO

## 2016-12-27 ENCOUNTER — Other Ambulatory Visit (HOSPITAL_COMMUNITY): Payer: PPO

## 2016-12-27 ENCOUNTER — Other Ambulatory Visit (HOSPITAL_COMMUNITY): Payer: Self-pay | Admitting: Adult Health

## 2016-12-27 DIAGNOSIS — Z122 Encounter for screening for malignant neoplasm of respiratory organs: Secondary | ICD-10-CM

## 2016-12-29 ENCOUNTER — Encounter (HOSPITAL_COMMUNITY): Payer: Self-pay

## 2016-12-29 ENCOUNTER — Encounter (HOSPITAL_BASED_OUTPATIENT_CLINIC_OR_DEPARTMENT_OTHER): Payer: PPO

## 2016-12-29 ENCOUNTER — Other Ambulatory Visit: Payer: Self-pay

## 2016-12-29 ENCOUNTER — Ambulatory Visit (HOSPITAL_COMMUNITY)
Admission: RE | Admit: 2016-12-29 | Discharge: 2016-12-29 | Disposition: A | Discharge status: Home / Self Care | Payer: PPO | Source: Ambulatory Visit | Attending: Adult Health | Admitting: Adult Health

## 2016-12-29 ENCOUNTER — Encounter (HOSPITAL_COMMUNITY): Payer: PPO | Attending: Oncology

## 2016-12-29 VITALS — BP 155/59 | HR 73 | Temp 98.3°F | Resp 20 | Wt 169.4 lb

## 2016-12-29 DIAGNOSIS — Z853 Personal history of malignant neoplasm of breast: Secondary | ICD-10-CM | POA: Insufficient documentation

## 2016-12-29 DIAGNOSIS — Z122 Encounter for screening for malignant neoplasm of respiratory organs: Secondary | ICD-10-CM

## 2016-12-29 DIAGNOSIS — M8580 Other specified disorders of bone density and structure, unspecified site: Secondary | ICD-10-CM | POA: Diagnosis not present

## 2016-12-29 DIAGNOSIS — C50011 Malignant neoplasm of nipple and areola, right female breast: Secondary | ICD-10-CM | POA: Diagnosis not present

## 2016-12-29 DIAGNOSIS — Z801 Family history of malignant neoplasm of trachea, bronchus and lung: Secondary | ICD-10-CM | POA: Insufficient documentation

## 2016-12-29 DIAGNOSIS — Z170 Estrogen receptor positive status [ER+]: Secondary | ICD-10-CM | POA: Insufficient documentation

## 2016-12-29 DIAGNOSIS — C3490 Malignant neoplasm of unspecified part of unspecified bronchus or lung: Secondary | ICD-10-CM | POA: Diagnosis not present

## 2016-12-29 DIAGNOSIS — Z79811 Long term (current) use of aromatase inhibitors: Secondary | ICD-10-CM | POA: Diagnosis not present

## 2016-12-29 LAB — CBC WITH DIFFERENTIAL/PLATELET
BASOS ABS: 0 10*3/uL (ref 0.0–0.1)
Basophils Relative: 0 %
EOS PCT: 1 %
Eosinophils Absolute: 0.1 10*3/uL (ref 0.0–0.7)
HEMATOCRIT: 38.5 % (ref 36.0–46.0)
HEMOGLOBIN: 12.5 g/dL (ref 12.0–15.0)
LYMPHS ABS: 2.7 10*3/uL (ref 0.7–4.0)
LYMPHS PCT: 32 %
MCH: 31.3 pg (ref 26.0–34.0)
MCHC: 32.5 g/dL (ref 30.0–36.0)
MCV: 96.3 fL (ref 78.0–100.0)
Monocytes Absolute: 0.4 10*3/uL (ref 0.1–1.0)
Monocytes Relative: 5 %
NEUTROS ABS: 5.1 10*3/uL (ref 1.7–7.7)
Neutrophils Relative %: 62 %
Platelets: 290 10*3/uL (ref 150–400)
RBC: 4 MIL/uL (ref 3.87–5.11)
RDW: 12.6 % (ref 11.5–15.5)
WBC: 8.2 10*3/uL (ref 4.0–10.5)

## 2016-12-29 LAB — COMPREHENSIVE METABOLIC PANEL
ALT: 36 U/L (ref 14–54)
AST: 31 U/L (ref 15–41)
Albumin: 4 g/dL (ref 3.5–5.0)
Alkaline Phosphatase: 46 U/L (ref 38–126)
Anion gap: 13 (ref 5–15)
BUN: 21 mg/dL — ABNORMAL HIGH (ref 6–20)
CO2: 24 mmol/L (ref 22–32)
Calcium: 9.7 mg/dL (ref 8.9–10.3)
Chloride: 102 mmol/L (ref 101–111)
Creatinine, Ser: 1.02 mg/dL — ABNORMAL HIGH (ref 0.44–1.00)
GFR calc Af Amer: >60 mL/min (ref >60)
GFR calc non Af Amer: 54 mL/min — ABNORMAL LOW (ref >60)
Glucose, Bld: 190 mg/dL — ABNORMAL HIGH (ref 65–99)
Potassium: 4.1 mmol/L (ref 3.5–5.1)
Sodium: 139 mmol/L (ref 135–145)
Total Bilirubin: 0.5 mg/dL (ref 0.3–1.2)
Total Protein: 7.5 g/dL (ref 6.5–8.1)

## 2016-12-29 MED ORDER — DENOSUMAB 60 MG/ML ~~LOC~~ SOLN
60.0000 mg | Freq: Once | SUBCUTANEOUS | Status: AC
  Administered 2016-12-29: 60 mg via SUBCUTANEOUS
  Filled 2016-12-29: qty 1

## 2016-12-29 NOTE — Progress Notes (Signed)
Amy Houston presents today for injection per MD orders. Prolia 60 mg administered SQ in left lower abdomen. Administration without incident. Patient tolerated well. Patient tolerated treatment without incidence. Patient discharged ambulatory and in stable condition from clinic. Patient to follow up as scheduled.

## 2016-12-29 NOTE — Patient Instructions (Signed)
[place] at [facility] Discharge Instructions  RECOMMENDATIONS MADE BY THE CONSULTANT AND ANY TEST RESULTS WILL BE SENT TO YOUR REFERRING PHYSICIAN.  You received your Prolia injection. Follow up as scheduled.  Thank you for choosing [place] at [facility] to provide your oncology and hematology care.  To afford each patient quality time with our provider, please arrive at least 15 minutes before your scheduled appointment time.    If you have a lab appointment with the [facility] please come in thru the  Main Entrance and check in at the main information desk  You need to re-schedule your appointment should you arrive 10 or more minutes late.  We strive to give you quality time with our providers, and arriving late affects you and other patients whose appointments are after yours.  Also, if you no show three or more times for appointments you may be dismissed from the clinic at the providers discretion.     Again, thank you for choosing [facility].  Our hope is that these requests will decrease the amount of time that you wait before being seen by our physicians.       _____________________________________________________________  Should you have questions after your visit to [facility], please contact our office at [phone] between the hours of 8:30 a.m. and 4:30 p.m.  Voicemails left after 4:30 p.m. will not be returned until the following business day.  For prescription refill requests, have your pharmacy contact our office.       Resources For Cancer Patients and their Caregivers ? American Cancer Society: Can assist with transportation, wigs, general needs, runs Look Good Feel Better.        4451797028 ? Cancer Care: Provides financial assistance, online support groups, medication/co-pay assistance.  1-800-813-HOPE (419)420-0261) ? [place] Assists [place] cancer  patients and their families through emotional , educational and financial support.  858 522 0953 ? [place] DSS Where to apply for food stamps, Medicaid and utility assistance. (678)115-8449 ? [employer]: Transportation to medical appointments. (321) 598-6501 ? Social Security Administration: May apply for disability if have a Stage IV cancer. 248-416-1849 251-176-4854 ? [employer], Disability and Transit Services: Assists with nutrition, care and transit needs. [place] Support Programs: @10RELATIVEDAYS @ > Cancer Support Group  2nd Tuesday of the month 1pm-2pm, Journey Room  > Creative Journey  3rd Tuesday of the month 1130am-1pm, Journey Room  > Look Good Feel Better  1st Wednesday of the month 10am-12 noon, Journey Room (Call [place] to register [phone])

## 2016-12-31 DIAGNOSIS — I890 Lymphedema, not elsewhere classified: Secondary | ICD-10-CM | POA: Diagnosis not present

## 2016-12-31 DIAGNOSIS — I972 Postmastectomy lymphedema syndrome: Secondary | ICD-10-CM | POA: Diagnosis not present

## 2017-05-20 ENCOUNTER — Emergency Department (HOSPITAL_COMMUNITY)
Admission: EM | Admit: 2017-05-20 | Discharge: 2017-05-21 | Disposition: A | Discharge status: Home / Self Care | Payer: PPO | Attending: Emergency Medicine | Admitting: Emergency Medicine

## 2017-05-20 ENCOUNTER — Emergency Department (HOSPITAL_COMMUNITY): Payer: PPO

## 2017-05-20 ENCOUNTER — Encounter (HOSPITAL_COMMUNITY): Payer: Self-pay | Admitting: Emergency Medicine

## 2017-05-20 DIAGNOSIS — I10 Essential (primary) hypertension: Secondary | ICD-10-CM | POA: Diagnosis not present

## 2017-05-20 DIAGNOSIS — R1110 Vomiting, unspecified: Secondary | ICD-10-CM | POA: Diagnosis not present

## 2017-05-20 DIAGNOSIS — R05 Cough: Secondary | ICD-10-CM | POA: Diagnosis not present

## 2017-05-20 DIAGNOSIS — R6883 Chills (without fever): Secondary | ICD-10-CM | POA: Diagnosis not present

## 2017-05-20 DIAGNOSIS — R197 Diarrhea, unspecified: Secondary | ICD-10-CM | POA: Diagnosis not present

## 2017-05-20 DIAGNOSIS — R112 Nausea with vomiting, unspecified: Secondary | ICD-10-CM | POA: Insufficient documentation

## 2017-05-20 DIAGNOSIS — R1031 Right lower quadrant pain: Secondary | ICD-10-CM | POA: Diagnosis not present

## 2017-05-20 DIAGNOSIS — Z79899 Other long term (current) drug therapy: Secondary | ICD-10-CM | POA: Diagnosis not present

## 2017-05-20 DIAGNOSIS — R109 Unspecified abdominal pain: Secondary | ICD-10-CM | POA: Diagnosis not present

## 2017-05-20 DIAGNOSIS — E119 Type 2 diabetes mellitus without complications: Secondary | ICD-10-CM | POA: Insufficient documentation

## 2017-05-20 DIAGNOSIS — Z853 Personal history of malignant neoplasm of breast: Secondary | ICD-10-CM | POA: Insufficient documentation

## 2017-05-20 LAB — URINALYSIS, ROUTINE W REFLEX MICROSCOPIC
BACTERIA UA: NONE SEEN
Bilirubin Urine: NEGATIVE
Glucose, UA: NEGATIVE mg/dL
Hgb urine dipstick: NEGATIVE
Ketones, ur: 5 mg/dL — AB
NITRITE: NEGATIVE
Protein, ur: 30 mg/dL — AB
SPECIFIC GRAVITY, URINE: 1.026 (ref 1.005–1.030)
pH: 5 (ref 5.0–8.0)

## 2017-05-20 LAB — COMPREHENSIVE METABOLIC PANEL
ALBUMIN: 4.2 g/dL (ref 3.5–5.0)
ALT: 55 U/L — AB (ref 14–54)
AST: 43 U/L — AB (ref 15–41)
Alkaline Phosphatase: 35 U/L — ABNORMAL LOW (ref 38–126)
Anion gap: 10 (ref 5–15)
BILIRUBIN TOTAL: 0.8 mg/dL (ref 0.3–1.2)
BUN: 17 mg/dL (ref 6–20)
CO2: 24 mmol/L (ref 22–32)
CREATININE: 0.87 mg/dL (ref 0.44–1.00)
Calcium: 8.8 mg/dL — ABNORMAL LOW (ref 8.9–10.3)
Chloride: 101 mmol/L (ref 101–111)
GFR calc Af Amer: >60 mL/min (ref >60)
GFR calc non Af Amer: >60 mL/min (ref >60)
GLUCOSE: 206 mg/dL — AB (ref 65–99)
POTASSIUM: 3.3 mmol/L — AB (ref 3.5–5.1)
Sodium: 135 mmol/L (ref 135–145)
TOTAL PROTEIN: 7.6 g/dL (ref 6.5–8.1)

## 2017-05-20 LAB — LIPASE, BLOOD: Lipase: 27 U/L (ref 11–51)

## 2017-05-20 LAB — CBC
HEMATOCRIT: 40.2 % (ref 36.0–46.0)
Hemoglobin: 13.2 g/dL (ref 12.0–15.0)
MCH: 31.3 pg (ref 26.0–34.0)
MCHC: 32.8 g/dL (ref 30.0–36.0)
MCV: 95.3 fL (ref 78.0–100.0)
PLATELETS: 286 10*3/uL (ref 150–400)
RBC: 4.22 MIL/uL (ref 3.87–5.11)
RDW: 12.8 % (ref 11.5–15.5)
WBC: 8.8 10*3/uL (ref 4.0–10.5)

## 2017-05-20 LAB — I-STAT CG4 LACTIC ACID, ED: LACTIC ACID, VENOUS: 1.6 mmol/L (ref 0.5–1.9)

## 2017-05-20 MED ORDER — ONDANSETRON 4 MG PO TBDP
4.0000 mg | ORAL_TABLET | Freq: Once | ORAL | Status: AC | PRN
  Administered 2017-05-20: 4 mg via ORAL
  Filled 2017-05-20: qty 1

## 2017-05-20 MED ORDER — ONDANSETRON HCL 4 MG/2ML IJ SOLN
4.0000 mg | Freq: Once | INTRAMUSCULAR | Status: AC
  Administered 2017-05-20: 4 mg via INTRAVENOUS
  Filled 2017-05-20: qty 2

## 2017-05-20 MED ORDER — SODIUM CHLORIDE 0.9 % IV BOLUS
1000.0000 mL | Freq: Once | INTRAVENOUS | Status: AC
  Administered 2017-05-20: 1000 mL via INTRAVENOUS

## 2017-05-20 MED ORDER — IOPAMIDOL (ISOVUE-300) INJECTION 61%
100.0000 mL | Freq: Once | INTRAVENOUS | Status: AC | PRN
  Administered 2017-05-21: 100 mL via INTRAVENOUS

## 2017-05-20 NOTE — ED Provider Notes (Signed)
[facility] EMERGENCY DEPARTMENT Provider Note   CSN: [id number] Arrival date & time: 05/20/17  2013     History   Chief Complaint Chief Complaint  Patient presents with  . Emesis    HPI Amy Houston is a 72 y.o. female.  Patient presents to the ED with a 2-day history of "crud".  States she had vomiting that started 2 nights ago and vomited 4 or 5 times yesterday and one time today.  Emesis was nonbloody and nonbilious.  She had about 10 episodes of diarrhea yesterday and 6 today.  She has not taken her medication in several days due to feeling nauseated.  She has not had a fever but has had chills.  Her husband has been sick with a respiratory illness and she has a bit of a cough as well.  Denies any chest pain or shortness of breath.  Nuys any pain with urination or blood in the urine.  No vaginal bleeding or discharge.  They traveled to [place] last week but has not been outside the country.  She has not been on antibiotics recently.  She has a history of breast cancer in remission and diabetes not on medications.  She still has an appendix.  No gallbladder.  The history is provided by the patient and the spouse.  Emesis   Associated symptoms include abdominal pain, chills, cough and diarrhea. Pertinent negatives include no fever and no headaches.    Past Medical History:  Diagnosis Date  . Abnormal genetic test 04/09/2013   PALB2 and CHEK2 positivity   . Arthritis    "of the spine" (05/08/2012)  . Breast cancer ([place]) 1995   "left" (05/08/2012)  . Breast cancer, right breast, IDC, Multifocal 03/12/2012   Multifocal, 10 and 8 left breast   . Diabetes mellitus 1999   "dx; never on meds" (05/08/2012)  . Exertional shortness of breath   . GERD (gastroesophageal reflux disease)    tums  . H/O hiatal hernia   . Hemorrhoid   . History of blood transfusion    post child birth  . HTN (hypertension)   . IBS (irritable bowel syndrome)   . Osteopenia 12/11/2012   On Ca++ and Vit  D. Bone density in April 2014.  Next bone density due in April 2016.  Marland Kitchen PONV (postoperative nausea and vomiting)   . PPD positive, treated 1999    Patient Active Problem List   Diagnosis Date Noted  . Rectal bleeding 03/14/2016  . Diverticulitis of colon 03/08/2015  . LLQ abdominal pain 01/22/2015  . Genetic testing 09/10/2014  . Acquired trigger finger 05/06/2013  . Abnormal genetic test 04/09/2013  . Osteopenia 12/11/2012  . Thyroid nodule 06/21/2012  . GERD (gastroesophageal reflux disease) 06/03/2012  . Adenomatous polyps 06/03/2012  . Breast cancer, right breast, IDC, Multifocal 03/12/2012  . Popliteal cyst 08/18/2010  . Hx Left breast cancer, UOQ, IDC, receptor negative 11/08/1993    Past Surgical History:  Procedure Laterality Date  . ABDOMINAL HYSTERECTOMY Bilateral 01/30/2013   Procedure: HYSTERECTOMY ABDOMINAL;  Surgeon: Melina Schools, MD;  Location: [facility];  Service: Gynecology;  Laterality: Bilateral;  . BREAST BIOPSY Left 1995  . BREAST BIOPSY Bilateral 03/2012   "one on the left; 2 on the right" (05/08/2012)  . BREAST LUMPECTOMY Left 1995  . CHOLECYSTECTOMY  1992  . COLONOSCOPY  12/06/93   Dr. Hayes:single diminutive polyp of the descending colon/extrernal hemorrhoids, benign path  . COLONOSCOPY  11/30/98   Dr Hayes:small rectal  polyp/internal hemorrhoids, benign path  . COLONOSCOPY  11/26/2006   Dr. Hayes:sigmoid polyp/small interal hemorrhoids, adenomatous  . COLONOSCOPY N/A 03/30/2016   Procedure: COLONOSCOPY;  Surgeon: Danie Binder, MD;  [facility];  Service: Endoscopy;  Laterality: N/A;  8:30am  . COLONOSCOPY WITH ESOPHAGOGASTRODUODENOSCOPY (EGD) N/A 07/01/2012   Dr. Oneida Alar: 2 small adenomas, sigmoid colon diverticula, surveillance in 2019  . DILATION AND CURETTAGE OF UTERUS  1968  . ESOPHAGEAL DILATION  07/01/2012   Dr. Oneida Alar: proximal esophageal web, Schatzki's ring at GE junction, s/p dilaiton. Mild non-erosive gastritis. Repeat EGD July 23, 2012.   . ESOPHAGOGASTRODUODENOSCOPY  1994   Dr. Cristina Gong: small hiatal hernia and Schatzki's ring widely patent, CLO test negative  . ESOPHAGOGASTRODUODENOSCOPY (EGD) WITH ESOPHAGEAL DILATION N/A 07/23/2012   Dr. Fields:proximal esophageal web v. radiation induced stricture/schatzki ring/small HH, s/p dilation  . MASTECTOMY COMPLETE / SIMPLE Left 05/08/2012  . MASTECTOMY COMPLETE / SIMPLE W/ SENTINEL NODE BIOPSY Right 05/08/2012  . SALPINGOOPHORECTOMY Bilateral 01/30/2013   Procedure: SALPINGO OOPHORECTOMY;  Surgeon: Melina Schools, MD;  Location: [facility];  Service: Gynecology;  Laterality: Bilateral;  . SIMPLE MASTECTOMY WITH AXILLARY SENTINEL NODE BIOPSY Right 05/08/2012   Procedure: RIGHT TOTAL MASTECTOMY WITH AXILLARY SENTINEL NODE BIOPSY;  Surgeon: Haywood Lasso, MD;  Location: [facility];  Service: General;  Laterality: Right;  . SIMPLE MASTECTOMY WITH AXILLARY SENTINEL NODE BIOPSY Left 05/08/2012   Procedure: LEFT TOTAL MASTECTOMY;  Surgeon: Haywood Lasso, MD;  Location: [facility];  Service: General;  Laterality: Left;  . TONSILLECTOMY AND ADENOIDECTOMY  ~ 2008  . TUBAL LIGATION  1976  . UVULOPALATOPHARYNGOPLASTY, TONSILLECTOMY AND SEPTOPLASTY  ~ 2008     OB History   None      Home Medications    Prior to Admission medications   Medication Sig Start Date End Date Taking? Authorizing Provider  acetaminophen (TYLENOL) 500 MG tablet Take 1,000 mg by mouth daily as needed for moderate pain or headache.    [provider]  CALCIUM PO Take by mouth.    [provider]  calcium-vitamin D (OSCAL WITH D) 500-200 MG-UNIT tablet Take 1 tablet by mouth.    [provider]  Carboxymethylcellul-Glycerin (LUBRICATING EYE DROPS OP) Apply 1 drop to eye daily as needed (dry eyes).    [provider]  Cholecalciferol (VITAMIN D) 2000 units CAPS Take 2,000 Units by mouth at bedtime.    [provider]  exemestane (AROMASIN) 25 MG tablet TAKE (1) TABLET ONCE  DAILY IN THE MORNING, AFTER BREAKFAST. 09/06/16   Holley Bouche, NP  hydrocortisone (PROCTOZONE-HC) 2.5 % rectal cream Place 1 application rectally 2 (two) times daily. For 7 days 03/14/16   Annitta Needs, NP  lisinopril (PRINIVIL,ZESTRIL) 20 MG tablet Take 20 mg by mouth daily.    [provider]  metoprolol succinate (TOPROL-XL) 25 MG 24 hr tablet Take 25 mg by mouth daily.    [provider]  Multiple Vitamin (MULTIVITAMIN WITH MINERALS) TABS Take 1 tablet by mouth at bedtime.     [provider]  nystatin-triamcinolone (MYCOLOG II) cream Apply 1 application topically 2 (two) times daily. For 1 week. 01/31/16   Annitta Needs, NP  omeprazole (PRILOSEC) 20 MG capsule TAKE ONE CAPSULE BY MOUTH ONCE DAILY. 12/11/16   Annitta Needs, NP  venlafaxine (EFFEXOR) 50 MG tablet Take 50 mg by mouth 2 (two) times daily.    [provider]    Family History Family  History  Problem Relation Age of Onset  . Lung cancer Mother 75       smoker  . Leukemia Father 55       Hairy Cell at 54, CLL at 20  . Lymphoma Father 47       Hodgkins lymphoma  . Lung cancer Sister 3  . Thyroid cancer Brother 46       Medullary  . Lymphoma Brother 60       Follicular lymphoma  . Breast cancer Maternal Aunt        mother's paternal half sister  . Lung cancer Maternal Uncle        mother's paternal half brother  . Lung cancer Maternal Grandmother        smoker  . Other Daughter        leiomyoma of esophagus  . Colon cancer Maternal Aunt        mother's maternal half sister  . Cancer Paternal Uncle        oral cancer dx in his 43s  . Lung cancer Cousin        paternal cousin  . Breast cancer Cousin        paternal cousin died in her 35s    Social History Social History   Tobacco Use  . Smoking status: Never Smoker  . Smokeless tobacco: Never Used  Substance Use Topics  . Alcohol use: Yes    Comment: 05/08/2012 "margarita or glass of wine once or twice/yr"  . Drug  use: No     Allergies   Penicillins   Review of Systems Review of Systems  Constitutional: Positive for activity change, appetite change, chills and fatigue. Negative for fever.  HENT: Positive for congestion and sore throat.   Eyes: Negative for visual disturbance.  Respiratory: Positive for cough.   Cardiovascular: Negative for chest pain.  Gastrointestinal: Positive for abdominal pain, diarrhea, nausea and vomiting.  Genitourinary: Negative for dysuria and hematuria.  Musculoskeletal: Negative for back pain and gait problem.  Neurological: Positive for weakness. Negative for dizziness and headaches.   all other systems are negative except as noted in the HPI and PMH.     Physical Exam Updated Vital Signs BP (!) 161/62   Pulse 87   Temp 98.5 F (36.9 C) (Oral)   Resp 16   Ht 4' 11.5" (1.511 m)   Wt 72.6 kg (160 lb)   SpO2 98%   BMI 31.78 kg/m   Physical Exam  Constitutional: She is oriented to person, place, and time. She appears well-developed and well-nourished. No distress.  HENT:  Head: Normocephalic and atraumatic.  Mouth/Throat: Oropharynx is clear and moist. No oropharyngeal exudate.  Mildly dry mucous membranes  Eyes: Pupils are equal, round, and reactive to light. Conjunctivae and EOM are normal.  Neck: Normal range of motion. Neck supple.  No meningismus.  Cardiovascular: Normal rate, regular rhythm, normal heart sounds and intact distal pulses.  No murmur heard. Pulmonary/Chest: Effort normal and breath sounds normal. No respiratory distress.  Abdominal: Soft. There is tenderness. There is no rebound and no guarding.  Abdomen is soft, tenderness in the lower abdomen, right lower quadrant guarding.  Musculoskeletal: Normal range of motion. She exhibits no edema or tenderness.  No CVAT  Neurological: She is alert and oriented to person, place, and time. No cranial nerve deficit. She exhibits normal muscle tone. Coordination normal.  No ataxia on finger  to nose bilaterally. No pronator drift. 5/5 strength throughout. CN 2-12 intact.Equal  grip strength. Sensation intact.   Skin: Skin is warm. Capillary refill takes less than 2 seconds. No rash noted.  Psychiatric: She has a normal mood and affect. Her behavior is normal.  Nursing note and vitals reviewed.    ED Treatments / Results  Labs (all labs ordered are listed, but only abnormal results are displayed) Labs Reviewed  COMPREHENSIVE METABOLIC PANEL - Abnormal; Notable for the following components:      Result Value   Potassium 3.3 (*)    Glucose, Bld 206 (*)    Calcium 8.8 (*)    AST 43 (*)    ALT 55 (*)    Alkaline Phosphatase 35 (*)    All other components within normal limits  URINALYSIS, ROUTINE W REFLEX MICROSCOPIC - Abnormal; Notable for the following components:   APPearance HAZY (*)    Ketones, ur 5 (*)    Protein, ur 30 (*)    Leukocytes, UA TRACE (*)    All other components within normal limits  LIPASE, BLOOD  CBC  I-STAT CG4 LACTIC ACID, ED    EKG None  Radiology Dg Chest 2 View  Result Date: 05/21/2017 CLINICAL DATA:  Cough and vomiting. EXAM: CHEST - 2 VIEW COMPARISON:  12/29/2016 FINDINGS: Low lung volumes with bronchovascular crowding. Normal heart size and mediastinal contours. No consolidation, pleural effusion or pneumothorax. No pulmonary edema. Surgical clips in the left axilla. No acute osseous abnormalities. IMPRESSION: Low lung volumes without acute abnormality. Electronically Signed   By: Jeb Levering M.D.   On: 05/21/2017 00:53   Ct Abdomen Pelvis W Contrast  Result Date: 05/21/2017 CLINICAL DATA:  Abdominal pain.  Vomiting and diarrhea. EXAM: CT ABDOMEN AND PELVIS WITH CONTRAST TECHNIQUE: Multidetector CT imaging of the abdomen and pelvis was performed using the standard protocol following bolus administration of intravenous contrast. CONTRAST:  144m ISOVUE-300 IOPAMIDOL (ISOVUE-300) INJECTION 61% COMPARISON:  CT 01/13/2016 FINDINGS: Lower  chest: The lung bases are clear. No consolidation. No pleural fluid. There are coronary artery calcifications. Hepatobiliary: Decreased hepatic density consistent with steatosis. No focal hepatic lesion. Clips in the gallbladder fossa postcholecystectomy. No biliary dilatation. Pancreas: No ductal dilatation or inflammation. Spleen: Normal in size without focal abnormality. Unchanged splenule anteriorly. Adrenals/Urinary Tract: No adrenal nodule. No hydronephrosis. Mild perinephric edema similar to prior exam. 12 mm cyst in the lower right kidney has slightly increased from prior but remains simple fluid density. Urinary bladder is nondistended and not well evaluated. Stomach/Bowel: Small hiatal hernia. Stomach is nondistended. Fluid-filled/liquid stool in the cecum, ascending, descending and sigmoid colon. No associated colonic wall thickening or inflammatory change. No small bowel inflammation or obstruction. Normal appendix for example coronal images 55-57. Vascular/Lymphatic: Moderate aorto bi-iliac atherosclerosis. No acute vascular findings. Central mesenteric stranding with prominent mesenteric nodes that have progressed from prior exam. Mesenteric nodes are all subcentimeter. No retroperitoneal or pelvic adenopathy. Reproductive: Status post hysterectomy. No adnexal masses. Other: No ascites or free air.  Central mesenteric edema. Musculoskeletal: Unchanged vertebral body hemangioma within T8. Mild degenerative change in the spine. There are no acute or suspicious osseous abnormalities. IMPRESSION: 1. Fluid-filled/liquid stool throughout the colon suggesting diarrheal process. No significant colonic wall thickening or inflammation. 2. Central mesenteric edema with associated small lymph nodes suggesting mesenteric panniculitis. 3. Incidental hepatic steatosis. 4.  Aortic Atherosclerosis (ICD10-I70.0). Electronically Signed   By: MJeb LeveringM.D.   On: 05/21/2017 00:51    Procedures Procedures  (including critical care time)  Medications Ordered in ED Medications  sodium chloride  0.9 % bolus 1,000 mL (has no administration in time range)  ondansetron (ZOFRAN) injection 4 mg (has no administration in time range)  ondansetron (ZOFRAN-ODT) disintegrating tablet 4 mg (4 mg Oral Given 05/20/17 2041)     Initial Impression / Assessment and Plan / ED Course  I have reviewed the triage vital signs and the nursing notes.  Pertinent labs & imaging results that were available during my care of the patient were reviewed by me and considered in my medical decision making (see chart for details).  patient with 2-day history of vomiting and diarrhea.  On exam she appears mildly dehydrated.  She does have right lower quadrant tenderness.  Labs are reassuring with mild hypokalemia.  She is hydrated given symptom control.  Urinalysis sent for culture.  She denies UTI symptoms.  Labs with minimal transaminitis. Appendix normal on CT. Mesenteric panniculitis possibility d/w patient.   Tolerating PO in the ED. Abdomen benign. D/w patient supportive, oral hydration, PCP followup this week. Aware to discuss CT findings with her PCP and GI Dr. Oneida Alar. Return precautions discussed.   Final Clinical Impressions(s) / ED Diagnoses   Final diagnoses:  Nausea vomiting and diarrhea    ED Discharge Orders    None       Taurus Willis, Annie Main, MD 05/21/17 [phone]

## 2017-05-20 NOTE — ED Triage Notes (Signed)
Pt with vomiting and diarrhea since Friday.

## 2017-05-21 DIAGNOSIS — R05 Cough: Secondary | ICD-10-CM | POA: Diagnosis not present

## 2017-05-21 DIAGNOSIS — R197 Diarrhea, unspecified: Secondary | ICD-10-CM | POA: Diagnosis not present

## 2017-05-21 DIAGNOSIS — R109 Unspecified abdominal pain: Secondary | ICD-10-CM | POA: Diagnosis not present

## 2017-05-21 DIAGNOSIS — R1110 Vomiting, unspecified: Secondary | ICD-10-CM | POA: Diagnosis not present

## 2017-05-21 MED ORDER — ONDANSETRON 4 MG PO TBDP: 4.0000 mg | ORAL_TABLET | Freq: Three times a day (TID) | ORAL | 0 refills | Status: DC | PRN

## 2017-05-21 MED ORDER — ONDANSETRON HCL 4 MG/2ML IJ SOLN
4.0000 mg | Freq: Once | INTRAMUSCULAR | Status: AC
  Administered 2017-05-21: 4 mg via INTRAVENOUS
  Filled 2017-05-21: qty 2

## 2017-05-21 NOTE — Discharge Instructions (Signed)
As we discussed your nausea and vomiting is likely viral and should improve.  Your appendix is normal on CT scan.  Your CT scan did show inflammation of your mesentery called mesenteric panniculitis.  You should follow-up with your doctor regarding this.  Return to the ED if you develop new or worsening symptoms.

## 2017-05-21 NOTE — ED Notes (Signed)
ED Provider at bedside. 

## 2017-05-21 NOTE — ED Notes (Signed)
Pt returned from xray,  

## 2017-05-21 NOTE — ED Notes (Signed)
Pt and spouse updated on plan of care, denies any needs at present time,

## 2017-05-21 NOTE — ED Notes (Signed)
Pt given diet sprite per request,

## 2017-05-30 ENCOUNTER — Encounter: Payer: Self-pay | Admitting: Gastroenterology

## 2017-06-11 ENCOUNTER — Other Ambulatory Visit (HOSPITAL_COMMUNITY): Payer: Self-pay | Admitting: *Deleted

## 2017-06-11 ENCOUNTER — Ambulatory Visit (HOSPITAL_COMMUNITY)
Admission: RE | Admit: 2017-06-11 | Discharge: 2017-06-11 | Disposition: A | Discharge status: Home / Self Care | Payer: PPO | Source: Ambulatory Visit | Attending: Oncology | Admitting: Oncology

## 2017-06-11 DIAGNOSIS — Z853 Personal history of malignant neoplasm of breast: Secondary | ICD-10-CM | POA: Insufficient documentation

## 2017-06-11 DIAGNOSIS — I700 Atherosclerosis of aorta: Secondary | ICD-10-CM | POA: Diagnosis not present

## 2017-06-11 DIAGNOSIS — C50111 Malignant neoplasm of central portion of right female breast: Secondary | ICD-10-CM

## 2017-06-11 DIAGNOSIS — Z170 Estrogen receptor positive status [ER+]: Secondary | ICD-10-CM | POA: Diagnosis present

## 2017-06-11 DIAGNOSIS — R911 Solitary pulmonary nodule: Secondary | ICD-10-CM | POA: Diagnosis not present

## 2017-06-11 DIAGNOSIS — Z9011 Acquired absence of right breast and nipple: Secondary | ICD-10-CM | POA: Diagnosis not present

## 2017-06-11 DIAGNOSIS — I2510 Atherosclerotic heart disease of native coronary artery without angina pectoris: Secondary | ICD-10-CM | POA: Diagnosis not present

## 2017-06-25 ENCOUNTER — Ambulatory Visit (HOSPITAL_COMMUNITY)
Admission: RE | Admit: 2017-06-25 | Discharge: 2017-06-25 | Disposition: A | Discharge status: Home / Self Care | Payer: PPO | Source: Ambulatory Visit | Attending: Oncology | Admitting: Oncology

## 2017-06-25 DIAGNOSIS — Z853 Personal history of malignant neoplasm of breast: Secondary | ICD-10-CM | POA: Insufficient documentation

## 2017-06-25 DIAGNOSIS — C50011 Malignant neoplasm of nipple and areola, right female breast: Secondary | ICD-10-CM | POA: Diagnosis present

## 2017-06-25 DIAGNOSIS — Z170 Estrogen receptor positive status [ER+]: Secondary | ICD-10-CM

## 2017-06-25 DIAGNOSIS — Z9013 Acquired absence of bilateral breasts and nipples: Secondary | ICD-10-CM | POA: Insufficient documentation

## 2017-06-25 DIAGNOSIS — Z08 Encounter for follow-up examination after completed treatment for malignant neoplasm: Secondary | ICD-10-CM | POA: Diagnosis not present

## 2017-06-25 LAB — POCT I-STAT CREATININE: CREATININE: 0.9 mg/dL (ref 0.44–1.00)

## 2017-06-25 MED ORDER — GADOBENATE DIMEGLUMINE 529 MG/ML IV SOLN
15.0000 mL | Freq: Once | INTRAVENOUS | Status: AC | PRN
  Administered 2017-06-25: 15 mL via INTRAVENOUS

## 2017-06-27 ENCOUNTER — Ambulatory Visit (HOSPITAL_COMMUNITY): Payer: PPO

## 2017-06-27 ENCOUNTER — Other Ambulatory Visit (HOSPITAL_COMMUNITY): Payer: PPO

## 2017-06-29 ENCOUNTER — Other Ambulatory Visit (HOSPITAL_COMMUNITY): Payer: Self-pay

## 2017-06-29 DIAGNOSIS — C50111 Malignant neoplasm of central portion of right female breast: Secondary | ICD-10-CM

## 2017-06-29 DIAGNOSIS — Z170 Estrogen receptor positive status [ER+]: Principal | ICD-10-CM

## 2017-07-02 ENCOUNTER — Inpatient Hospital Stay (HOSPITAL_BASED_OUTPATIENT_CLINIC_OR_DEPARTMENT_OTHER): Payer: PPO | Admitting: Hematology

## 2017-07-02 ENCOUNTER — Inpatient Hospital Stay (HOSPITAL_COMMUNITY): Payer: PPO | Attending: Hematology

## 2017-07-02 ENCOUNTER — Inpatient Hospital Stay (HOSPITAL_COMMUNITY): Payer: PPO

## 2017-07-02 ENCOUNTER — Encounter (HOSPITAL_COMMUNITY): Payer: Self-pay | Admitting: Hematology

## 2017-07-02 ENCOUNTER — Other Ambulatory Visit (HOSPITAL_COMMUNITY): Payer: Self-pay | Admitting: Pharmacist

## 2017-07-02 VITALS — BP 154/73 | HR 83 | Temp 97.7°F | Resp 16 | Wt 165.8 lb

## 2017-07-02 DIAGNOSIS — Z923 Personal history of irradiation: Secondary | ICD-10-CM | POA: Diagnosis not present

## 2017-07-02 DIAGNOSIS — Z853 Personal history of malignant neoplasm of breast: Secondary | ICD-10-CM | POA: Insufficient documentation

## 2017-07-02 DIAGNOSIS — Z170 Estrogen receptor positive status [ER+]: Secondary | ICD-10-CM | POA: Insufficient documentation

## 2017-07-02 DIAGNOSIS — Z9013 Acquired absence of bilateral breasts and nipples: Secondary | ICD-10-CM | POA: Diagnosis not present

## 2017-07-02 DIAGNOSIS — Z79811 Long term (current) use of aromatase inhibitors: Secondary | ICD-10-CM | POA: Diagnosis not present

## 2017-07-02 DIAGNOSIS — Z9221 Personal history of antineoplastic chemotherapy: Secondary | ICD-10-CM | POA: Diagnosis not present

## 2017-07-02 DIAGNOSIS — C50111 Malignant neoplasm of central portion of right female breast: Secondary | ICD-10-CM | POA: Diagnosis not present

## 2017-07-02 DIAGNOSIS — Z0000 Encounter for general adult medical examination without abnormal findings: Secondary | ICD-10-CM

## 2017-07-02 DIAGNOSIS — C50911 Malignant neoplasm of unspecified site of right female breast: Secondary | ICD-10-CM | POA: Diagnosis not present

## 2017-07-02 DIAGNOSIS — M8580 Other specified disorders of bone density and structure, unspecified site: Secondary | ICD-10-CM | POA: Diagnosis not present

## 2017-07-02 DIAGNOSIS — Z79899 Other long term (current) drug therapy: Secondary | ICD-10-CM

## 2017-07-02 LAB — COMPREHENSIVE METABOLIC PANEL
ALT: 45 U/L — ABNORMAL HIGH (ref 0–44)
AST: 37 U/L (ref 15–41)
Albumin: 4.2 g/dL (ref 3.5–5.0)
Alkaline Phosphatase: 41 U/L (ref 38–126)
Anion gap: 8 (ref 5–15)
BILIRUBIN TOTAL: 0.9 mg/dL (ref 0.3–1.2)
BUN: 18 mg/dL (ref 8–23)
CO2: 27 mmol/L (ref 22–32)
Calcium: 9.7 mg/dL (ref 8.9–10.3)
Chloride: 102 mmol/L (ref 98–111)
Creatinine, Ser: 0.89 mg/dL (ref 0.44–1.00)
GFR calc Af Amer: >60 mL/min (ref >60)
GLUCOSE: 158 mg/dL — AB (ref 70–99)
POTASSIUM: 3.7 mmol/L (ref 3.5–5.1)
Sodium: 137 mmol/L (ref 135–145)
TOTAL PROTEIN: 7.5 g/dL (ref 6.5–8.1)

## 2017-07-02 LAB — CBC WITH DIFFERENTIAL/PLATELET
Basophils Absolute: 0 10*3/uL (ref 0.0–0.1)
Basophils Relative: 0 %
EOS PCT: 1 %
Eosinophils Absolute: 0.1 10*3/uL (ref 0.0–0.7)
HCT: 39.8 % (ref 36.0–46.0)
Hemoglobin: 13 g/dL (ref 12.0–15.0)
LYMPHS ABS: 3.1 10*3/uL (ref 0.7–4.0)
LYMPHS PCT: 39 %
MCH: 31.8 pg (ref 26.0–34.0)
MCHC: 32.7 g/dL (ref 30.0–36.0)
MCV: 97.3 fL (ref 78.0–100.0)
MONO ABS: 0.5 10*3/uL (ref 0.1–1.0)
Monocytes Relative: 6 %
Neutro Abs: 4.2 10*3/uL (ref 1.7–7.7)
Neutrophils Relative %: 54 %
PLATELETS: 277 10*3/uL (ref 150–400)
RBC: 4.09 MIL/uL (ref 3.87–5.11)
RDW: 12.6 % (ref 11.5–15.5)
WBC: 7.8 10*3/uL (ref 4.0–10.5)

## 2017-07-02 MED ORDER — DENOSUMAB 60 MG/ML ~~LOC~~ SOSY
60.0000 mg | PREFILLED_SYRINGE | Freq: Once | SUBCUTANEOUS | Status: AC
  Administered 2017-07-02: 60 mg via SUBCUTANEOUS
  Filled 2017-07-02: qty 1

## 2017-07-02 NOTE — Patient Instructions (Signed)
[place] at [facility] Discharge Instructions  Received Prolia injection today. Follow-up as scheduled. Call clinic for any questions or concerns   Thank you for choosing [place] at [facility] to provide your oncology and hematology care.  To afford each patient quality time with our provider, please arrive at least 15 minutes before your scheduled appointment time.   If you have a lab appointment with the [facility] please come in thru the  Main Entrance and check in at the main information desk  You need to re-schedule your appointment should you arrive 10 or more minutes late.  We strive to give you quality time with our providers, and arriving late affects you and other patients whose appointments are after yours.  Also, if you no show three or more times for appointments you may be dismissed from the clinic at the providers discretion.     Again, thank you for choosing [facility].  Our hope is that these requests will decrease the amount of time that you wait before being seen by our physicians.       _____________________________________________________________  Should you have questions after your visit to [facility], please contact our office at [phone] between the hours of 8:30 a.m. and 4:30 p.m.  Voicemails left after 4:30 p.m. will not be returned until the following business day.  For prescription refill requests, have your pharmacy contact our office.       Resources For Cancer Patients and their Caregivers ? American Cancer Society: Can assist with transportation, wigs, general needs, runs Look Good Feel Better.        901-129-0426 ? Cancer Care: Provides financial assistance, online support groups, medication/co-pay assistance.  1-800-813-HOPE 308-803-1527) ? [place] Assists [place] cancer patients and their families through emotional , educational and  financial support.  386-314-6905 ? [place] DSS Where to apply for food stamps, Medicaid and utility assistance. 615-428-0865 ? [facility]: Transportation to medical appointments. (712) 883-0852 ? Social Security Administration: May apply for disability if have a Stage IV cancer. 334-016-0237 2362581991 ? [employer], Disability and Transit Services: Assists with nutrition, care and transit needs. [place] Support Programs:   > Cancer Support Group  2nd Tuesday of the month 1pm-2pm, Journey Room   > Creative Journey  3rd Tuesday of the month 1130am-1pm, Journey Room

## 2017-07-02 NOTE — Patient Instructions (Signed)
[facility] at [facility] Discharge Instructions  You saw Dr. Katragadda today.   Thank you for choosing [facility] at [facility] to provide your oncology and hematology care.  To afford each patient quality time with our provider, please arrive at least 15 minutes before your scheduled appointment time.   If you have a lab appointment with the Cancer Center please come in thru the  Main Entrance and check in at the main information desk  You need to re-schedule your appointment should you arrive 10 or more minutes late.  We strive to give you quality time with our providers, and arriving late affects you and other patients whose appointments are after yours.  Also, if you no show three or more times for appointments you may be dismissed from the clinic at the providers discretion.     Again, thank you for choosing [facility].  Our hope is that these requests will decrease the amount of time that you wait before being seen by our physicians.       _____________________________________________________________  Should you have questions after your visit to  Cancer Center, please contact our office at [phone] between the hours of 8:30 a.m. and 4:30 p.m.  Voicemails left after 4:30 p.m. will not be returned until the following business day.  For prescription refill requests, have your pharmacy contact our office.       Resources For Cancer Patients and their Caregivers ? American Cancer Society: Can assist with transportation, wigs, general needs, runs Look Good Feel Better.        1-888-227-6333 ? Cancer Care: Provides financial assistance, online support groups, medication/co-pay assistance.  1-800-813-HOPE (4673) ? [facility] Assists [place] cancer patients and their families through emotional , educational and financial support.  336-427-4357 ? [place] DSS Where to apply for  food stamps, Medicaid and utility assistance. 336-342-1394 ? [facility]: Transportation to medical appointments. 336-347-2287 ? Social Security Administration: May apply for disability if have a Stage IV cancer. 336-342-7796 1-800-772-1213 ? [place] [facility], Disability and Transit Services: Assists with nutrition, care and transit needs. 336-349-2343  Cancer Center Support Programs:   > Cancer Support Group  2nd Tuesday of the month 1pm-2pm, Journey Room   > Creative Journey  3rd Tuesday of the month 1130am-1pm, Journey Room     

## 2017-07-02 NOTE — Progress Notes (Signed)
Amy Houston tolerated Prolia injection well without complaints or incident. Calcium 9.7 today and pt denied any tooth or jaw pain and no recent or future dental visits. Pt continues to take her Calcium PO as prescribed without any issues. Pt discharged self ambulatory in satisfactory condition

## 2017-07-04 NOTE — Progress Notes (Signed)
[place], [place] [address]   CLINIC:  Medical Oncology/Hematology  PCP:  [name], MD [place] [address] [phone]   REASON FOR VISIT:  Follow-up for breast cancer.  CURRENT THERAPY: Aromasin daily.  BRIEF ONCOLOGIC HISTORY:    Breast cancer, right breast, IDC, Multifocal   03/19/2012 Initial Diagnosis    Breast cancer, right breast, IDC, Multifocal      05/08/2012 Surgery    Bilateral simple mastectomies by Dr. Margot Chimes      06/03/2012 -  Chemotherapy    Aromatase inhibitor.  PALB2 and CHEK2+ and therefore would benefit from lifelong AI therapy.      01/30/2013 Surgery    Laparotomy, minimal lysis of adhesions, abdominal hysterectomy, bilateral salpingo-oophorectomy.        CANCER STAGING: Cancer Staging Breast cancer, right breast, IDC, Multifocal Staging form: Breast, AJCC 7th Edition - Clinical: No stage assigned - Unsigned - Pathologic: Stage IA (T1c, N0, cM0) - Signed by Haywood Lasso, MD on 05/13/2012    INTERVAL HISTORY:  Ms. Cannady 72 y.o. female returns for follow-up of her breast cancer.  She is tolerating Aromasin very well.  Denies any worsening of hot flashes or musculoskeletal symptoms.  Fatigue is stable.  Occasional nausea due to acid reflux present.  Energy levels are reported as 7 5%.  No recurrent infections or hospitalizations.  Denies any fevers, night sweats or weight loss.    REVIEW OF SYSTEMS:  Review of Systems  Constitutional: Positive for fatigue.  Gastrointestinal: Positive for nausea.  All other systems reviewed and are negative.    PAST MEDICAL/SURGICAL HISTORY:  Past Medical History:  Diagnosis Date  . Abnormal genetic test 04/09/2013   PALB2 and CHEK2 positivity   . Arthritis    "of the spine" (05/08/2012)  . Breast cancer ([facility]) 1995   "left" (05/08/2012)  . Breast cancer, right breast, IDC, Multifocal 03/12/2012   Multifocal, 10 and 8 left  breast   . Diabetes mellitus 1999   "dx; never on meds" (05/08/2012)  . Exertional shortness of breath   . GERD (gastroesophageal reflux disease)    tums  . H/O hiatal hernia   . Hemorrhoid   . History of blood transfusion    post child birth  . HTN (hypertension)   . IBS (irritable bowel syndrome)   . Osteopenia 12/11/2012   On Ca++ and Vit D. Bone density in April 2014.  Next bone density due in April 2016.  Marland Kitchen PONV (postoperative nausea and vomiting)   . PPD positive, treated 1999   Past Surgical History:  Procedure Laterality Date  . ABDOMINAL HYSTERECTOMY Bilateral 01/30/2013   Procedure: HYSTERECTOMY ABDOMINAL;  Surgeon: Melina Schools, MD;  Location: [facility];  Service: Gynecology;  Laterality: Bilateral;  . BREAST BIOPSY Left 1995  . BREAST BIOPSY Bilateral 03/2012   "one on the left; 2 on the right" (05/08/2012)  . BREAST LUMPECTOMY Left 1995  . CHOLECYSTECTOMY  1992  . COLONOSCOPY  12/06/93   Dr. Hayes:single diminutive polyp of the descending colon/extrernal hemorrhoids, benign path  . COLONOSCOPY  11/30/98   Dr Hayes:small rectal polyp/internal hemorrhoids, benign path  . COLONOSCOPY  11/26/2006   Dr. Hayes:sigmoid polyp/small interal hemorrhoids, adenomatous  . COLONOSCOPY N/A 03/30/2016   Procedure: COLONOSCOPY;  Surgeon: Danie Binder, MD;  [facility];  Service: Endoscopy;  Laterality: N/A;  8:30am  . COLONOSCOPY WITH ESOPHAGOGASTRODUODENOSCOPY (EGD) N/A 07/01/2012  Dr. Oneida Alar: 2 small adenomas, sigmoid colon diverticula, surveillance in 2019  . DILATION AND CURETTAGE OF UTERUS  1968  . ESOPHAGEAL DILATION  07/01/2012   Dr. Oneida Alar: proximal esophageal web, Schatzki's ring at GE junction, s/p dilaiton. Mild non-erosive gastritis. Repeat EGD July 23, 2012.   . ESOPHAGOGASTRODUODENOSCOPY  1994   Dr. Cristina Gong: small hiatal hernia and Schatzki's ring widely patent, CLO test negative  . ESOPHAGOGASTRODUODENOSCOPY (EGD) WITH ESOPHAGEAL DILATION N/A 07/23/2012   Dr.  Fields:proximal esophageal web v. radiation induced stricture/schatzki ring/small HH, s/p dilation  . MASTECTOMY COMPLETE / SIMPLE Left 05/08/2012  . MASTECTOMY COMPLETE / SIMPLE W/ SENTINEL NODE BIOPSY Right 05/08/2012  . SALPINGOOPHORECTOMY Bilateral 01/30/2013   Procedure: SALPINGO OOPHORECTOMY;  Surgeon: Melina Schools, MD;  Location: [facility];  Service: Gynecology;  Laterality: Bilateral;  . SIMPLE MASTECTOMY WITH AXILLARY SENTINEL NODE BIOPSY Right 05/08/2012   Procedure: RIGHT TOTAL MASTECTOMY WITH AXILLARY SENTINEL NODE BIOPSY;  Surgeon: Haywood Lasso, MD;  Location: [facility];  Service: General;  Laterality: Right;  . SIMPLE MASTECTOMY WITH AXILLARY SENTINEL NODE BIOPSY Left 05/08/2012   Procedure: LEFT TOTAL MASTECTOMY;  Surgeon: Haywood Lasso, MD;  Location: [facility];  Service: General;  Laterality: Left;  . TONSILLECTOMY AND ADENOIDECTOMY  ~ 2008  . TUBAL LIGATION  1976  . UVULOPALATOPHARYNGOPLASTY, TONSILLECTOMY AND SEPTOPLASTY  ~ 2008     SOCIAL HISTORY:  Social History   Socioeconomic History  . Marital status: Married    Spouse name: Not on file  . Number of children: 2  . Years of education: 12th grade  . Highest education level: Not on file  Occupational History  . Occupation: retired    Fish farm manager: [employer]  . Financial resource strain: Not on file  . Food insecurity:    Worry: Not on file    Inability: Not on file  . Transportation needs:    Medical: Not on file    Non-medical: Not on file  Tobacco Use  . Smoking status: Never Smoker  . Smokeless tobacco: Never Used  Substance and Sexual Activity  . Alcohol use: Yes    Comment: 05/08/2012 "margarita or glass of wine once or twice/yr"  . Drug use: No  . Sexual activity: Never  Lifestyle  . Physical activity:    Days per week: Not on file    Minutes per session: Not on file  . Stress: Not on file  Relationships  . Social connections:    Talks on phone: Not on file    Gets together:  Not on file    Attends religious service: Not on file    Active member of club or organization: Not on file    Attends meetings of clubs or organizations: Not on file    Relationship status: Not on file  . Intimate partner violence:    Fear of current or ex partner: Not on file    Emotionally abused: Not on file    Physically abused: Not on file    Forced sexual activity: Not on file  Other Topics Concern  . Not on file  Social History Narrative  . Not on file    FAMILY HISTORY:  Family History  Problem Relation Age of Onset  . Lung cancer Mother 20       smoker  . Leukemia Father 44       Hairy Cell at 7, CLL at 18  . Lymphoma Father 29  Hodgkins lymphoma  . Lung cancer Sister 62  . Thyroid cancer Brother 46       Medullary  . Lymphoma Brother 60       Follicular lymphoma  . Breast cancer Maternal Aunt        mother's paternal half sister  . Lung cancer Maternal Uncle        mother's paternal half brother  . Lung cancer Maternal Grandmother        smoker  . Other Daughter        leiomyoma of esophagus  . Colon cancer Maternal Aunt        mother's maternal half sister  . Cancer Paternal Uncle        oral cancer dx in his 4s  . Lung cancer Cousin        paternal cousin  . Breast cancer Cousin        paternal cousin died in her 21s    CURRENT MEDICATIONS:  Outpatient Encounter Medications as of 07/02/2017  Medication Sig  . acetaminophen (TYLENOL) 500 MG tablet Take 1,000 mg by mouth daily as needed for moderate pain or headache.  Marland Kitchen CALCIUM PO Take by mouth.  . calcium-vitamin D (OSCAL WITH D) 500-200 MG-UNIT tablet Take 1 tablet by mouth.  . Carboxymethylcellul-Glycerin (LUBRICATING EYE DROPS OP) Apply 1 drop to eye daily as needed (dry eyes).  . Cholecalciferol (VITAMIN D) 2000 units CAPS Take 2,000 Units by mouth at bedtime.  Marland Kitchen exemestane (AROMASIN) 25 MG tablet TAKE (1) TABLET ONCE DAILY IN THE MORNING, AFTER BREAKFAST.  . hydrocortisone (PROCTOZONE-HC)  2.5 % rectal cream Place 1 application rectally 2 (two) times daily. For 7 days  . lisinopril (PRINIVIL,ZESTRIL) 20 MG tablet Take 20 mg by mouth daily.  . metoprolol succinate (TOPROL-XL) 25 MG 24 hr tablet Take 25 mg by mouth daily.  . Multiple Vitamin (MULTIVITAMIN WITH MINERALS) TABS Take 1 tablet by mouth at bedtime.   Marland Kitchen nystatin-triamcinolone (MYCOLOG II) cream Apply 1 application topically 2 (two) times daily. For 1 week.  Marland Kitchen omeprazole (PRILOSEC) 20 MG capsule TAKE ONE CAPSULE BY MOUTH ONCE DAILY.  Marland Kitchen ondansetron (ZOFRAN ODT) 4 MG disintegrating tablet Take 1 tablet (4 mg total) by mouth every 8 (eight) hours as needed for nausea or vomiting.  . venlafaxine (EFFEXOR) 50 MG tablet Take 50 mg by mouth 2 (two) times daily.   No facility-administered encounter medications on file as of 07/02/2017.     ALLERGIES:  Allergies  Allergen Reactions  . Penicillins Rash    Has patient had a PCN reaction causing immediate rash, facial/tongue/throat swelling, SOB or lightheadedness with hypotension: Yes Has patient had a PCN reaction causing severe rash involving mucus membranes or skin necrosis: No Has patient had a PCN reaction that required hospitalization No Has patient had a PCN reaction occurring within the last 10 years: No If all of the above answers are "NO", then may proceed with Cephalosporin use.      PHYSICAL EXAM:  ECOG Performance status: 1  Vitals:   07/02/17 1446  BP: (!) 154/73  Pulse: 83  Resp: 16  Temp: 97.7 F (36.5 C)  SpO2: 99%   Filed Weights   07/02/17 1446  Weight: 165 lb 12.8 oz (75.2 kg)    Physical Exam HEENT: Oropharynx has no thrush. Chest: Bilaterally clear to auscultation. CVS: S1-S2 regular rate and rhythm. Bilateral mastectomy sites have no palpable nodules. Extremities: No edema or cyanosis. Abdomen: No palpable masses or hepatospleno megaly.  LABORATORY DATA:  I have reviewed the labs as listed.  CBC    Component Value Date/Time   WBC  7.8 07/02/2017 1420   RBC 4.09 07/02/2017 1420   HGB 13.0 07/02/2017 1420   HCT 39.8 07/02/2017 1420   PLT 277 07/02/2017 1420   MCV 97.3 07/02/2017 1420   MCH 31.8 07/02/2017 1420   MCHC 32.7 07/02/2017 1420   RDW 12.6 07/02/2017 1420   LYMPHSABS 3.1 07/02/2017 1420   MONOABS 0.5 07/02/2017 1420   EOSABS 0.1 07/02/2017 1420   BASOSABS 0.0 07/02/2017 1420   CMP Latest Ref Rng & Units 07/02/2017 06/25/2017 05/20/2017  Glucose 70 - 99 mg/dL 158(H) - 206(H)  BUN 8 - 23 mg/dL 18 - 17  Creatinine 0.44 - 1.00 mg/dL 0.89 0.90 0.87  Sodium 135 - 145 mmol/L 137 - 135  Potassium 3.5 - 5.1 mmol/L 3.7 - 3.3(L)  Chloride 98 - 111 mmol/L 102 - 101  CO2 22 - 32 mmol/L 27 - 24  Calcium 8.9 - 10.3 mg/dL 9.7 - 8.8(L)  Total Protein 6.5 - 8.1 g/dL 7.5 - 7.6  Total Bilirubin 0.3 - 1.2 mg/dL 0.9 - 0.8  Alkaline Phos 38 - 126 U/L 41 - 35(L)  AST 15 - 41 U/L 37 - 43(H)  ALT 0 - 44 U/L 45(H) - 55(H)       DIAGNOSTIC IMAGING:  I have independently reviewed images of the CT scan of the chest dated 06/11/2017.  I have also reviewed reports of MRI.  I discussed the results with the patient in detail.     ASSESSMENT & PLAN:   Breast cancer, right breast, IDC, Multifocal 1.  Stage Ia (T1c N0) right breast IDC: - 1.2 cm, grade 2, ER/PR positive, HER-2 negative, positive LVH, Ki-67 of 60%, 0 out of 4 lymph nodes positive, Oncotype DX of 14 -Status post bilateral mastectomy on 05/08/2012, BSO on 01/30/2013, Aromasin started on 06/03/2012 - PALB 2 and CHEK 2 positive - She is continuing Aromasin beyond 5 years.  She is tolerating it very well.  Today's physical examination did not reveal any suspicious nodules. - I have reviewed the MRI dated 06/25/2017 which was BI-RADS Category 1. -CT scan of the chest on 06/11/2017 shows stable left upper lobe lung nodule.  2.  Osteopenia: -DEXA scan in April 2018 shows T score of -2.1 at right femoral neck.  She was started on Prolia in 2016 and continues every 6 months.   She is tolerating it very well.  Her next bone density test is scheduled next year.  3.  Left breast cancer: -Diagnosed in 1996, underwent lumpectomy, CMF x8 cycles followed by radiation therapy.  4.  Family history: Brother had thyroid cancer, another brother had lymphoma, sister had lung cancer, father had a recent leukemia, mother had lung cancer, first cousin has  breast cancer, daughter at age 44 had breast cancer.  She underwent genetic testing.      Orders placed this encounter:  Orders Placed This Encounter  Procedures  . Hemoglobin A1c  . CBC with Differential/Platelet  . Comprehensive metabolic panel      Derek Jack, MD [place] [phone]

## 2017-07-04 NOTE — Assessment & Plan Note (Addendum)
1.  Stage Ia (T1c N0) right breast IDC: - 1.2 cm, grade 2, ER/PR positive, HER-2 negative, positive LVH, Ki-67 of 60%, 0 out of 4 lymph nodes positive, Oncotype DX of 14 -Status post bilateral mastectomy on 05/08/2012, BSO on 01/30/2013, Aromasin started on 06/03/2012 - PALB 2 and CHEK 2 positive - She is continuing Aromasin beyond 5 years.  She is tolerating it very well.  Today's physical examination did not reveal any suspicious nodules. - I have reviewed the MRI dated 06/25/2017 which was BI-RADS Category 1. -CT scan of the chest on 06/11/2017 shows stable left upper lobe lung nodule.  2.  Osteopenia: -DEXA scan in April 2018 shows T score of -2.1 at right femoral neck.  She was started on Prolia in 2016 and continues every 6 months.  She is tolerating it very well.  Her next bone density test is scheduled next year.  3.  Left breast cancer: -Diagnosed in 1996, underwent lumpectomy, CMF x8 cycles followed by radiation therapy.  4.  Family history: Brother had thyroid cancer, another brother had lymphoma, sister had lung cancer, father had a recent leukemia, mother had lung cancer, first cousin has  breast cancer, daughter at age 29 had breast cancer.  She underwent genetic testing.

## 2017-07-31 ENCOUNTER — Telehealth: Payer: Self-pay | Admitting: Gastroenterology

## 2017-07-31 DIAGNOSIS — R1030 Lower abdominal pain, unspecified: Secondary | ICD-10-CM

## 2017-07-31 DIAGNOSIS — Z8719 Personal history of other diseases of the digestive system: Secondary | ICD-10-CM

## 2017-07-31 MED ORDER — CIPROFLOXACIN HCL 500 MG PO TABS: 500.0000 mg | ORAL_TABLET | Freq: Two times a day (BID) | ORAL | 0 refills | Status: DC

## 2017-07-31 MED ORDER — METRONIDAZOLE 500 MG PO TABS: 500.0000 mg | ORAL_TABLET | Freq: Three times a day (TID) | ORAL | 0 refills | Status: DC

## 2017-07-31 NOTE — Addendum Note (Signed)
Addended by: Hassan Rowan on: 07/31/2017 04:53 PM   Modules accepted: Orders

## 2017-07-31 NOTE — Telephone Encounter (Signed)
Recommend CT abd/pelvis to document this. Sending in Cipro and Flagyl for 7 days. Clear liquids today. Advance to soft diet when discomfort improves.

## 2017-07-31 NOTE — Telephone Encounter (Signed)
CT abd/pelvis scheduled for 08/01/17 at 1:00pm, arrive at 12:45pm. Pickup contrast. NPO 4 hours prior to test. Called and informed pt of appt. She is aware AB recommends clear liquids today, but she may try a baked potato for supper.

## 2017-07-31 NOTE — Telephone Encounter (Signed)
Pt is aware of the plan. She plans to leave town on Sunday. Would like to have CT tomorrow if possible after 11:00 am.  She has a 10:00 am meeting.  Forwarding to [place] to schedule.

## 2017-07-31 NOTE — Telephone Encounter (Signed)
Pt said she has a hx of diverticulitis and is having some lower abdominal pain on both sides. It started a day or so ago but is worse today.  She feels bloated and feels like sometimes she doesn't empty Bm's very well. She usually has several loose stools a day. She does not have a fever. She uses [employer].  Vicente Males, please advise!

## 2017-07-31 NOTE — Telephone Encounter (Signed)
[phone]   Please call patient, she wants to know if we can call her something in for diverticulitis, she is having a flair up

## 2017-07-31 NOTE — Addendum Note (Signed)
Addended by: Annitta Needs on: 07/31/2017 04:21 PM   Modules accepted: Orders

## 2017-08-01 ENCOUNTER — Ambulatory Visit (HOSPITAL_COMMUNITY)
Admission: RE | Admit: 2017-08-01 | Discharge: 2017-08-01 | Disposition: A | Discharge status: Home / Self Care | Payer: PPO | Source: Ambulatory Visit | Attending: Gastroenterology | Admitting: Gastroenterology

## 2017-08-01 DIAGNOSIS — K5730 Diverticulosis of large intestine without perforation or abscess without bleeding: Secondary | ICD-10-CM | POA: Insufficient documentation

## 2017-08-01 DIAGNOSIS — R1030 Lower abdominal pain, unspecified: Secondary | ICD-10-CM | POA: Insufficient documentation

## 2017-08-01 DIAGNOSIS — Z8719 Personal history of other diseases of the digestive system: Secondary | ICD-10-CM | POA: Diagnosis not present

## 2017-08-01 DIAGNOSIS — R109 Unspecified abdominal pain: Secondary | ICD-10-CM | POA: Diagnosis not present

## 2017-08-01 MED ORDER — IOPAMIDOL (ISOVUE-300) INJECTION 61%
100.0000 mL | Freq: Once | INTRAVENOUS | Status: AC | PRN
Start: 2017-08-01 — End: 2017-08-01
  Administered 2017-08-01: 100 mL via INTRAVENOUS

## 2017-08-01 NOTE — Progress Notes (Signed)
PT is aware. Said she still hurts a little. She took one dose of the meds last night, but did not want to take on empty stomach this morning. She will hold off of the meds for now and start taking if she has any problems since she will be on vacation until after 08/14/2017.   Forwarding to [place] to make appt.

## 2017-08-01 NOTE — Progress Notes (Signed)
CT is normal without evidence of diverticulitis. How is she doing? Did she already start antibiotics? At this point could hold off from taking them. Needs appt with [place] in next 1-2 weeks.

## 2017-08-02 NOTE — Progress Notes (Signed)
Noted  

## 2017-08-03 ENCOUNTER — Ambulatory Visit (INDEPENDENT_AMBULATORY_CARE_PROVIDER_SITE_OTHER): Payer: PPO | Admitting: Gastroenterology

## 2017-08-03 ENCOUNTER — Encounter: Payer: Self-pay | Admitting: Gastroenterology

## 2017-08-03 VITALS — BP 139/57 | HR 67 | Temp 98.4°F | Ht 59.5 in | Wt 166.6 lb

## 2017-08-03 DIAGNOSIS — R1032 Left lower quadrant pain: Secondary | ICD-10-CM | POA: Diagnosis not present

## 2017-08-03 NOTE — Patient Instructions (Signed)
I am glad you are doing better!  Please call with any concerns.  We can see you back in a year and as needed.  Your next colonoscopy is in 2023.  Have a wonderful trip!  It was a pleasure to see you today. I strive to create trusting relationships with patients to provide genuine, compassionate, and quality care. I value your feedback. If you receive a survey regarding your visit,  I greatly appreciate you taking time to fill this out.   Annitta Needs, PhD, ANP-BC [facility]

## 2017-08-03 NOTE — Assessment & Plan Note (Signed)
72 year old female with a history of CT documented diverticulitis in Jan 2017, also treated empirically in Jan 2018 but CT findings at that time without evidence of diverticulitis. CT earlier this week due to recurrent symptoms and antibiotics called in empirically until CT findings reviewed: CT negative but she has begun antibiotics and feels improved. As she has already started, complete a short course. Follow soft diet for about a week, then advance to high fiber. Avoid popcorn. Return in 1 year or sooner if needed.

## 2017-08-03 NOTE — Progress Notes (Signed)
Primary Care Physician:  [name], MD  Primary GI: Dr. Oneida Alar   Chief Complaint  Patient presents with  . Follow-up    HPI:   Amy Houston is a 72 y.o. female presenting today with a history of CT documented diverticulitis in Jan 2017, also treated empirically in Jan 2018 but CT findings at that time without evidence of diverticulitis. She called in earlier this week with what was felt to be a diverticular flare: CT without evidence of diverticulitis. Antibiotics had been sent in empirically to begin if diverticulitis documented; she was instructed to hold off on taking these as no CT findings for acute inflammation but has desired to finish a course.    LLQ pain intermittently, improved from Tuesday. Taking antibiotics and feeling better. Does not feel constipated. No straining. No vomiting. Tends to have nausea in the mornings but feels more related to reflux issues. Takes Prilosec once daily in the mornings. Eats late at night.   Past Medical History:  Diagnosis Date  . Abnormal genetic test 04/09/2013   PALB2 and CHEK2 positivity   . Arthritis    "of the spine" (05/08/2012)  . Breast cancer ([place]) 1995   "left" (05/08/2012)  . Breast cancer, right breast, IDC, Multifocal 03/12/2012   Multifocal, 10 and 8 left breast   . Diabetes mellitus 1999   "dx; never on meds" (05/08/2012)  . Exertional shortness of breath   . GERD (gastroesophageal reflux disease)    tums  . H/O hiatal hernia   . Hemorrhoid   . History of blood transfusion    post child birth  . HTN (hypertension)   . IBS (irritable bowel syndrome)   . Osteopenia 12/11/2012   On Ca++ and Vit D. Bone density in April 2014.  Next bone density due in April 2016.  Marland Kitchen PONV (postoperative nausea and vomiting)   . PPD positive, treated 1999    Past Surgical History:  Procedure Laterality Date  . ABDOMINAL HYSTERECTOMY Bilateral 01/30/2013   Procedure: HYSTERECTOMY ABDOMINAL;  Surgeon: Melina Schools, MD;   Location: [facility];  Service: Gynecology;  Laterality: Bilateral;  . BREAST BIOPSY Left 1995  . BREAST BIOPSY Bilateral 03/2012   "one on the left; 2 on the right" (05/08/2012)  . BREAST LUMPECTOMY Left 1995  . CHOLECYSTECTOMY  1992  . COLONOSCOPY  12/06/93   Dr. Hayes:single diminutive polyp of the descending colon/extrernal hemorrhoids, benign path  . COLONOSCOPY  11/30/98   Dr Hayes:small rectal polyp/internal hemorrhoids, benign path  . COLONOSCOPY  11/26/2006   Dr. Hayes:sigmoid polyp/small interal hemorrhoids, adenomatous  . COLONOSCOPY N/A 03/30/2016   Dr. Oneida Alar: normal TI, 4 mm rectal polyp, multiple small and large-mouthed diverticula in rectosigmoid and sigmoid colon, non-bleeding internal hemorrhoids, tubular adenoma on path. surveillance in 5 yeras  . COLONOSCOPY WITH ESOPHAGOGASTRODUODENOSCOPY (EGD) N/A 07/01/2012   Dr. Oneida Alar: 2 small adenomas, sigmoid colon diverticula, surveillance in 2019  . DILATION AND CURETTAGE OF UTERUS  1968  . ESOPHAGEAL DILATION  07/01/2012   Dr. Oneida Alar: proximal esophageal web, Schatzki's ring at GE junction, s/p dilaiton. Mild non-erosive gastritis. Repeat EGD July 23, 2012.   . ESOPHAGOGASTRODUODENOSCOPY  1994   Dr. Cristina Gong: small hiatal hernia and Schatzki's ring widely patent, CLO test negative  . ESOPHAGOGASTRODUODENOSCOPY (EGD) WITH ESOPHAGEAL DILATION N/A 07/23/2012   Dr. Fields:proximal esophageal web v. radiation induced stricture/schatzki ring/small HH, s/p dilation  . MASTECTOMY COMPLETE / SIMPLE Left 05/08/2012  . MASTECTOMY COMPLETE / SIMPLE W/  SENTINEL NODE BIOPSY Right 05/08/2012  . SALPINGOOPHORECTOMY Bilateral 01/30/2013   Procedure: SALPINGO OOPHORECTOMY;  Surgeon: Melina Schools, MD;  Location: [facility];  Service: Gynecology;  Laterality: Bilateral;  . SIMPLE MASTECTOMY WITH AXILLARY SENTINEL NODE BIOPSY Right 05/08/2012   Procedure: RIGHT TOTAL MASTECTOMY WITH AXILLARY SENTINEL NODE BIOPSY;  Surgeon: Haywood Lasso, MD;  Location: [facility];   Service: General;  Laterality: Right;  . SIMPLE MASTECTOMY WITH AXILLARY SENTINEL NODE BIOPSY Left 05/08/2012   Procedure: LEFT TOTAL MASTECTOMY;  Surgeon: Haywood Lasso, MD;  Location: [facility];  Service: General;  Laterality: Left;  . TONSILLECTOMY AND ADENOIDECTOMY  ~ 2008  . TUBAL LIGATION  1976  . UVULOPALATOPHARYNGOPLASTY, TONSILLECTOMY AND SEPTOPLASTY  ~ 2008    Current Outpatient Medications  Medication Sig Dispense Refill  . acetaminophen (TYLENOL) 500 MG tablet Take 1,000 mg by mouth daily as needed for moderate pain or headache.    Marland Kitchen CALCIUM PO Take by mouth.    . calcium-vitamin D (OSCAL WITH D) 500-200 MG-UNIT tablet Take 1 tablet by mouth.    . Carboxymethylcellul-Glycerin (LUBRICATING EYE DROPS OP) Apply 1 drop to eye daily as needed (dry eyes).    . Cholecalciferol (VITAMIN D) 2000 units CAPS Take 2,000 Units by mouth at bedtime.    . ciprofloxacin (CIPRO) 500 MG tablet Take 1 tablet (500 mg total) by mouth 2 (two) times daily. 14 tablet 0  . exemestane (AROMASIN) 25 MG tablet TAKE (1) TABLET ONCE DAILY IN THE MORNING, AFTER BREAKFAST. 30 tablet 9  . hydrocortisone (PROCTOZONE-HC) 2.5 % rectal cream Place 1 application rectally 2 (two) times daily. For 7 days 30 g 1  . lisinopril (PRINIVIL,ZESTRIL) 20 MG tablet Take 20 mg by mouth daily.    . metoprolol succinate (TOPROL-XL) 25 MG 24 hr tablet Take 25 mg by mouth daily.    . metroNIDAZOLE (FLAGYL) 500 MG tablet Take 1 tablet (500 mg total) by mouth 3 (three) times daily. 21 tablet 0  . Multiple Vitamin (MULTIVITAMIN WITH MINERALS) TABS Take 1 tablet by mouth at bedtime.     Marland Kitchen omeprazole (PRILOSEC) 20 MG capsule TAKE ONE CAPSULE BY MOUTH ONCE DAILY. 90 capsule 3  . ondansetron (ZOFRAN ODT) 4 MG disintegrating tablet Take 1 tablet (4 mg total) by mouth every 8 (eight) hours as needed for nausea or vomiting. 20 tablet 0  . venlafaxine (EFFEXOR) 50 MG tablet Take 50 mg by mouth 2 (two) times daily.    Marland Kitchen nystatin-triamcinolone  (MYCOLOG II) cream Apply 1 application topically 2 (two) times daily. For 1 week. (Patient not taking: Reported on 08/03/2017) 30 g 1   No current facility-administered medications for this visit.     Allergies as of 08/03/2017 - Review Complete 08/03/2017  Allergen Reaction Noted  . Penicillins Rash 08/18/2010    Family History  Problem Relation Age of Onset  . Lung cancer Mother 39       smoker  . Leukemia Father 74       Hairy Cell at 34, CLL at 50  . Lymphoma Father 57       Hodgkins lymphoma  . Lung cancer Sister 63  . Thyroid cancer Brother 46       Medullary  . Lymphoma Brother 60       Follicular lymphoma  . Breast cancer Maternal Aunt        mother's paternal half sister  . Lung cancer Maternal Uncle        mother's paternal half  brother  . Lung cancer Maternal Grandmother        smoker  . Other Daughter        leiomyoma of esophagus  . Colon cancer Maternal Aunt        mother's maternal half sister  . Cancer Paternal Uncle        oral cancer dx in his 46s  . Lung cancer Cousin        paternal cousin  . Breast cancer Cousin        paternal cousin died in her 23s    Social History   Socioeconomic History  . Marital status: Married    Spouse name: Not on file  . Number of children: 2  . Years of education: 12th grade  . Highest education level: Not on file  Occupational History  . Occupation: retired    Fish farm manager: [employer]  . Financial resource strain: Not on file  . Food insecurity:    Worry: Not on file    Inability: Not on file  . Transportation needs:    Medical: Not on file    Non-medical: Not on file  Tobacco Use  . Smoking status: Never Smoker  . Smokeless tobacco: Never Used  Substance and Sexual Activity  . Alcohol use: Yes    Comment: 05/08/2012 "margarita or glass of wine once or twice/yr"  . Drug use: No  . Sexual activity: Never  Lifestyle  . Physical activity:    Days per week: Not on file    Minutes per  session: Not on file  . Stress: Not on file  Relationships  . Social connections:    Talks on phone: Not on file    Gets together: Not on file    Attends religious service: Not on file    Active member of club or organization: Not on file    Attends meetings of clubs or organizations: Not on file    Relationship status: Not on file  Other Topics Concern  . Not on file  Social History Narrative  . Not on file    Review of Systems: Gen: Denies fever, chills, anorexia. Denies fatigue, weakness, weight loss.  CV: Denies chest pain, palpitations, syncope, peripheral edema, and claudication. Resp: Denies dyspnea at rest, cough, wheezing, coughing up blood, and pleurisy. GI: see HPI  Derm: Denies rash, itching, dry skin Psych: Denies depression, anxiety, memory loss, confusion. No homicidal or suicidal ideation.  Heme: Denies bruising, bleeding, and enlarged lymph nodes.  Physical Exam: BP (!) 139/57   Pulse 67   Temp 98.4 F (36.9 C) (Oral)   Ht 4' 11.5" (1.511 m)   Wt 166 lb 9.6 oz (75.6 kg)   BMI 33.09 kg/m  General:   Alert and oriented. No distress noted. Pleasant and cooperative.  Head:  Normocephalic and atraumatic. Eyes:  Conjuctiva clear without scleral icterus. Mouth:  Oral mucosa pink and moist.  Abdomen:  +BS, soft, non-tender and non-distended. No rebound or guarding. No HSM or masses noted. Msk:  Symmetrical without gross deformities. Normal posture. Extremities:  Without edema. Neurologic:  Alert and  oriented x4 Psych:  Alert and cooperative. Normal mood and affect.

## 2017-08-06 NOTE — Progress Notes (Signed)
cc'ed to pcp °

## 2017-09-11 ENCOUNTER — Other Ambulatory Visit (HOSPITAL_COMMUNITY): Payer: Self-pay | Admitting: *Deleted

## 2017-09-11 MED ORDER — EXEMESTANE 25 MG PO TABS: 25.0000 mg | ORAL_TABLET | Freq: Every day | ORAL | 9 refills | Status: DC

## 2017-12-04 DIAGNOSIS — Z23 Encounter for immunization: Secondary | ICD-10-CM | POA: Diagnosis not present

## 2017-12-05 DIAGNOSIS — L821 Other seborrheic keratosis: Secondary | ICD-10-CM | POA: Diagnosis not present

## 2017-12-05 DIAGNOSIS — L820 Inflamed seborrheic keratosis: Secondary | ICD-10-CM | POA: Diagnosis not present

## 2017-12-05 DIAGNOSIS — L304 Erythema intertrigo: Secondary | ICD-10-CM | POA: Diagnosis not present

## 2018-01-01 ENCOUNTER — Other Ambulatory Visit (HOSPITAL_COMMUNITY): Payer: Self-pay

## 2018-01-01 DIAGNOSIS — M8580 Other specified disorders of bone density and structure, unspecified site: Secondary | ICD-10-CM

## 2018-01-03 ENCOUNTER — Inpatient Hospital Stay (HOSPITAL_COMMUNITY): Payer: PPO

## 2018-01-03 ENCOUNTER — Inpatient Hospital Stay (HOSPITAL_COMMUNITY): Payer: PPO | Attending: Hematology

## 2018-01-03 ENCOUNTER — Encounter (HOSPITAL_COMMUNITY): Payer: Self-pay

## 2018-01-03 ENCOUNTER — Other Ambulatory Visit: Payer: Self-pay

## 2018-01-03 ENCOUNTER — Other Ambulatory Visit (HOSPITAL_COMMUNITY): Payer: Self-pay | Admitting: Nurse Practitioner

## 2018-01-03 VITALS — BP 142/64 | HR 72 | Temp 98.5°F | Resp 18

## 2018-01-03 DIAGNOSIS — Z9221 Personal history of antineoplastic chemotherapy: Secondary | ICD-10-CM | POA: Insufficient documentation

## 2018-01-03 DIAGNOSIS — C50911 Malignant neoplasm of unspecified site of right female breast: Secondary | ICD-10-CM | POA: Diagnosis not present

## 2018-01-03 DIAGNOSIS — M8580 Other specified disorders of bone density and structure, unspecified site: Secondary | ICD-10-CM

## 2018-01-03 DIAGNOSIS — Z853 Personal history of malignant neoplasm of breast: Secondary | ICD-10-CM | POA: Insufficient documentation

## 2018-01-03 DIAGNOSIS — Z9013 Acquired absence of bilateral breasts and nipples: Secondary | ICD-10-CM | POA: Insufficient documentation

## 2018-01-03 DIAGNOSIS — Z170 Estrogen receptor positive status [ER+]: Secondary | ICD-10-CM | POA: Insufficient documentation

## 2018-01-03 DIAGNOSIS — C50011 Malignant neoplasm of nipple and areola, right female breast: Secondary | ICD-10-CM

## 2018-01-03 DIAGNOSIS — Z923 Personal history of irradiation: Secondary | ICD-10-CM | POA: Diagnosis not present

## 2018-01-03 DIAGNOSIS — R911 Solitary pulmonary nodule: Secondary | ICD-10-CM

## 2018-01-03 DIAGNOSIS — Z79811 Long term (current) use of aromatase inhibitors: Secondary | ICD-10-CM | POA: Diagnosis not present

## 2018-01-03 LAB — COMPREHENSIVE METABOLIC PANEL
ALBUMIN: 4.3 g/dL (ref 3.5–5.0)
ALT: 61 U/L — ABNORMAL HIGH (ref 0–44)
AST: 50 U/L — AB (ref 15–41)
Alkaline Phosphatase: 35 U/L — ABNORMAL LOW (ref 38–126)
Anion gap: 8 (ref 5–15)
BILIRUBIN TOTAL: 0.3 mg/dL (ref 0.3–1.2)
BUN: 20 mg/dL (ref 8–23)
CO2: 26 mmol/L (ref 22–32)
Calcium: 10.1 mg/dL (ref 8.9–10.3)
Chloride: 104 mmol/L (ref 98–111)
Creatinine, Ser: 0.88 mg/dL (ref 0.44–1.00)
GFR calc Af Amer: >60 mL/min (ref >60)
GFR calc non Af Amer: >60 mL/min (ref >60)
GLUCOSE: 193 mg/dL — AB (ref 70–99)
POTASSIUM: 4.3 mmol/L (ref 3.5–5.1)
Sodium: 138 mmol/L (ref 135–145)
TOTAL PROTEIN: 7.6 g/dL (ref 6.5–8.1)

## 2018-01-03 MED ORDER — DENOSUMAB 60 MG/ML ~~LOC~~ SOSY
60.0000 mg | PREFILLED_SYRINGE | Freq: Once | SUBCUTANEOUS | Status: AC
  Administered 2018-01-03: 60 mg via SUBCUTANEOUS
  Filled 2018-01-03: qty 1

## 2018-01-03 NOTE — Progress Notes (Signed)
Pt presents today for Prolia injection. VSS. No complaints of any changes since the last visit. Appt schedule printed off and given to patient. Consent obtained and copy made.   Tolerated injection without adverse affects. Vital signs stable. No complaints at this time. Discharged from clinic ambulatory. F/U with [facility] as scheduled.   Amy Houston presents today for injection per MD orders. Prolia administered SQ in left Abdomen. Administration without incident. Patient tolerated well.

## 2018-01-03 NOTE — Patient Instructions (Signed)
[facility] at [facility]  Discharge Instructions:   _______________________________________________________________  Thank you for choosing [facility] at [facility] to provide your oncology and hematology care.  To afford each patient quality time with our providers, please arrive at least 15 minutes before your scheduled appointment.  You need to re-schedule your appointment if you arrive 10 or more minutes late.  We strive to give you quality time with our providers, and arriving late affects you and other patients whose appointments are after yours.  Also, if you no show three or more times for appointments you may be dismissed from the clinic.  Again, thank you for choosing [facility] at [facility]. Our hope is that these requests will allow you access to exceptional care and in a timely manner. _______________________________________________________________  If you have questions after your visit, please contact our office at [phone] between the hours of 8:30 a.m. and 5:00 p.m. Voicemails left after 4:30 p.m. will not be returned until the following business day. _______________________________________________________________  For prescription refill requests, have your pharmacy contact our office. _______________________________________________________________  Recommendations made by the consultant and any test results will be sent to your referring physician. _______________________________________________________________ 

## 2018-01-04 ENCOUNTER — Other Ambulatory Visit (HOSPITAL_COMMUNITY): Payer: Self-pay | Admitting: Nurse Practitioner

## 2018-01-09 ENCOUNTER — Other Ambulatory Visit (HOSPITAL_COMMUNITY): Payer: Self-pay | Admitting: Nurse Practitioner

## 2018-01-09 DIAGNOSIS — C50011 Malignant neoplasm of nipple and areola, right female breast: Secondary | ICD-10-CM

## 2018-01-09 DIAGNOSIS — Z170 Estrogen receptor positive status [ER+]: Principal | ICD-10-CM

## 2018-01-16 ENCOUNTER — Other Ambulatory Visit: Payer: Self-pay | Admitting: Gastroenterology

## 2018-02-12 DIAGNOSIS — E119 Type 2 diabetes mellitus without complications: Secondary | ICD-10-CM | POA: Diagnosis not present

## 2018-02-12 DIAGNOSIS — C50011 Malignant neoplasm of nipple and areola, right female breast: Secondary | ICD-10-CM | POA: Diagnosis not present

## 2018-02-12 DIAGNOSIS — C50012 Malignant neoplasm of nipple and areola, left female breast: Secondary | ICD-10-CM | POA: Diagnosis not present

## 2018-02-12 LAB — HM DIABETES EYE EXAM

## 2018-07-03 ENCOUNTER — Other Ambulatory Visit: Payer: Self-pay

## 2018-07-03 ENCOUNTER — Other Ambulatory Visit (HOSPITAL_COMMUNITY): Payer: Self-pay | Admitting: Emergency Medicine

## 2018-07-03 DIAGNOSIS — R911 Solitary pulmonary nodule: Secondary | ICD-10-CM

## 2018-07-04 ENCOUNTER — Inpatient Hospital Stay (HOSPITAL_COMMUNITY): Payer: PPO | Attending: Hematology

## 2018-07-04 ENCOUNTER — Inpatient Hospital Stay (HOSPITAL_BASED_OUTPATIENT_CLINIC_OR_DEPARTMENT_OTHER): Payer: PPO | Admitting: Hematology

## 2018-07-04 ENCOUNTER — Encounter (HOSPITAL_COMMUNITY): Payer: Self-pay | Admitting: Hematology

## 2018-07-04 ENCOUNTER — Inpatient Hospital Stay (HOSPITAL_COMMUNITY): Payer: PPO

## 2018-07-04 VITALS — BP 149/55 | HR 77 | Temp 97.3°F | Resp 18 | Wt 159.4 lb

## 2018-07-04 DIAGNOSIS — R911 Solitary pulmonary nodule: Secondary | ICD-10-CM | POA: Diagnosis not present

## 2018-07-04 DIAGNOSIS — C50111 Malignant neoplasm of central portion of right female breast: Secondary | ICD-10-CM | POA: Diagnosis not present

## 2018-07-04 DIAGNOSIS — Z79811 Long term (current) use of aromatase inhibitors: Secondary | ICD-10-CM | POA: Diagnosis not present

## 2018-07-04 DIAGNOSIS — Z9013 Acquired absence of bilateral breasts and nipples: Secondary | ICD-10-CM | POA: Diagnosis not present

## 2018-07-04 DIAGNOSIS — Z923 Personal history of irradiation: Secondary | ICD-10-CM | POA: Insufficient documentation

## 2018-07-04 DIAGNOSIS — M8580 Other specified disorders of bone density and structure, unspecified site: Secondary | ICD-10-CM

## 2018-07-04 DIAGNOSIS — Z853 Personal history of malignant neoplasm of breast: Secondary | ICD-10-CM | POA: Insufficient documentation

## 2018-07-04 DIAGNOSIS — Z170 Estrogen receptor positive status [ER+]: Secondary | ICD-10-CM | POA: Insufficient documentation

## 2018-07-04 DIAGNOSIS — Z9221 Personal history of antineoplastic chemotherapy: Secondary | ICD-10-CM

## 2018-07-04 LAB — CBC WITH DIFFERENTIAL/PLATELET
Abs Immature Granulocytes: 0.04 10*3/uL (ref 0.00–0.07)
Basophils Absolute: 0 10*3/uL (ref 0.0–0.1)
Basophils Relative: 0 %
Eosinophils Absolute: 0.1 10*3/uL (ref 0.0–0.5)
Eosinophils Relative: 1 %
HCT: 39.7 % (ref 36.0–46.0)
Hemoglobin: 12.9 g/dL (ref 12.0–15.0)
Immature Granulocytes: 1 %
Lymphocytes Relative: 36 %
Lymphs Abs: 2.6 10*3/uL (ref 0.7–4.0)
MCH: 31.5 pg (ref 26.0–34.0)
MCHC: 32.5 g/dL (ref 30.0–36.0)
MCV: 97.1 fL (ref 80.0–100.0)
Monocytes Absolute: 0.4 10*3/uL (ref 0.1–1.0)
Monocytes Relative: 6 %
Neutro Abs: 4.1 10*3/uL (ref 1.7–7.7)
Neutrophils Relative %: 56 %
Platelets: 292 10*3/uL (ref 150–400)
RBC: 4.09 MIL/uL (ref 3.87–5.11)
RDW: 12.6 % (ref 11.5–15.5)
WBC: 7.2 10*3/uL (ref 4.0–10.5)
nRBC: 0 % (ref 0.0–0.2)

## 2018-07-04 LAB — COMPREHENSIVE METABOLIC PANEL
ALT: 27 U/L (ref 0–44)
AST: 24 U/L (ref 15–41)
Albumin: 4.5 g/dL (ref 3.5–5.0)
Alkaline Phosphatase: 33 U/L — ABNORMAL LOW (ref 38–126)
Anion gap: 11 (ref 5–15)
BUN: 21 mg/dL (ref 8–23)
CO2: 23 mmol/L (ref 22–32)
Calcium: 9.6 mg/dL (ref 8.9–10.3)
Chloride: 106 mmol/L (ref 98–111)
Creatinine, Ser: 0.91 mg/dL (ref 0.44–1.00)
GFR calc Af Amer: >60 mL/min (ref >60)
GFR calc non Af Amer: >60 mL/min (ref >60)
Glucose, Bld: 140 mg/dL — ABNORMAL HIGH (ref 70–99)
Potassium: 4.3 mmol/L (ref 3.5–5.1)
Sodium: 140 mmol/L (ref 135–145)
Total Bilirubin: 0.7 mg/dL (ref 0.3–1.2)
Total Protein: 7.5 g/dL (ref 6.5–8.1)

## 2018-07-04 MED ORDER — DENOSUMAB 60 MG/ML ~~LOC~~ SOLN
60.0000 mg | Freq: Once | SUBCUTANEOUS | Status: DC
Start: 2018-07-04 — End: 2018-07-04

## 2018-07-04 MED ORDER — DENOSUMAB 60 MG/ML ~~LOC~~ SOSY
PREFILLED_SYRINGE | SUBCUTANEOUS | Status: AC
  Filled 2018-07-04: qty 1

## 2018-07-04 MED ORDER — DENOSUMAB 60 MG/ML ~~LOC~~ SOSY
60.0000 mg | PREFILLED_SYRINGE | Freq: Once | SUBCUTANEOUS | Status: AC
  Administered 2018-07-04: 60 mg via SUBCUTANEOUS

## 2018-07-04 NOTE — Assessment & Plan Note (Addendum)
1.  Stage Ia (T1CN0) right breast IDC: -1.2 cm, grade 2, ER/PR positive, HER-2 negative, positive LVI, Ki-67 of 60%, 0/4 lymph nodes positive, Oncotype DX of 14. -Status post bilateral mastectomy on 05/08/2012, bilateral salpingo-oophorectomy on 01/30/2013, Aromasin started on 06/03/2012 -PALB2 and CHEK2 mutation positive. - She is continuing Aromasin beyond 5 years.  She is tolerating it very well.  Denies any musculoskeletal symptoms or vasomotor symptoms. -Bilateral mastectomy sites are within normal limits. -Last MRI of the breast dated 06/25/2017 was BI-RADS Category 1.  We will schedule another bilateral breast MRI with and without contrast. - She will come back in 6 months for follow-up.  2.  Lung nodule: -CT scan of the chest on 06/11/2017 shows left upper lobe lung nodule.  Will order another CT scan of the chest without contrast prior to next visit.  3.  Osteopenia: -DEXA scan on 04/20/2016 shows T score of -2.1. -She was started on Prolia in 2016, continuing every 6 months.  She is tolerating it very well. -She will continue calcium and vitamin D.  We will schedule him for a bone density test prior to next visit.  4.  Left breast cancer: -Diagnosed in 1996, underwent lumpectomy, CMF x8 cycles followed by radiation therapy.  5.  Family history: -Brother had thyroid cancer, another brother had lymphoma, sister had lung cancer and was a non-smoker.  Father had leukemia.  Mother had lung cancer and was a smoker.  First cousin had breast cancer.  Daughter at age 39 had breast cancer.  Daughter was also reportedly positive for PALB2. 

## 2018-07-04 NOTE — Progress Notes (Signed)
Amy Houston, [place] [address]   CLINIC:  Medical Oncology/Hematology  PCP:  [name], [place] [address] [phone]   REASON FOR VISIT:  Follow-up for breast cancer.  CURRENT THERAPY: Aromasin daily.  BRIEF ONCOLOGIC HISTORY:  Oncology History  Breast cancer, right breast, IDC, Multifocal  03/19/2012 Initial Diagnosis   Breast cancer, right breast, IDC, Multifocal   05/08/2012 Surgery   Bilateral simple mastectomies by Dr. Margot Chimes   06/03/2012 -  Chemotherapy   Aromatase inhibitor.  PALB2 and CHEK2+ and therefore would benefit from lifelong AI therapy.   01/30/2013 Surgery   Laparotomy, minimal lysis of adhesions, abdominal hysterectomy, bilateral salpingo-oophorectomy.      CANCER STAGING: Cancer Staging Breast cancer, right breast, IDC, Multifocal Staging form: Breast, AJCC 7th Edition - Clinical: No stage assigned - Unsigned - Pathologic: Stage IA (T1c, N0, cM0) - Signed by Haywood Lasso, MD on 05/13/2012    INTERVAL HISTORY:  Amy Houston 73 y.o. female returns for follow-up of her bilateral breast cancers.  She is taking Aromasin without any problems.  She denies any major hot flashes.  Denies any musculoskeletal symptoms.  Arthritic pains have been stable.  Denies any new cough or hemoptysis.  Appetite is 100%.  Energy levels are 75%.  She is taking calcium and vitamin D.  Denies any abdominal pains or bloating.  No fevers, night sweats or weight loss reported in the last 6 months.   REVIEW OF SYSTEMS:  Review of Systems  All other systems reviewed and are negative.    PAST MEDICAL/SURGICAL HISTORY:  Past Medical History:  Diagnosis Date  . Abnormal genetic test 04/09/2013   PALB2 and CHEK2 positivity   . Arthritis    "of the spine" (05/08/2012)  . Breast cancer ([facility]) 1995   "left" (05/08/2012)  . Breast cancer, right breast, IDC, Multifocal 03/12/2012   Multifocal, 10 and 8 left breast    . Diabetes mellitus 1999   "dx; never on meds" (05/08/2012)  . Exertional shortness of breath   . GERD (gastroesophageal reflux disease)    tums  . H/O hiatal hernia   . Hemorrhoid   . History of blood transfusion    post child birth  . HTN (hypertension)   . IBS (irritable bowel syndrome)   . Osteopenia 12/11/2012   On Ca++ and Vit D. Bone density in April 2014.  Next bone density due in April 2016.  Marland Kitchen PONV (postoperative nausea and vomiting)   . PPD positive, treated 1999   Past Surgical History:  Procedure Laterality Date  . ABDOMINAL HYSTERECTOMY Bilateral 01/30/2013   Procedure: HYSTERECTOMY ABDOMINAL;  Surgeon: Melina Schools, MD;  Location: [facility];  Service: Gynecology;  Laterality: Bilateral;  . BREAST BIOPSY Left 1995  . BREAST BIOPSY Bilateral 03/2012   "one on the left; 2 on the right" (05/08/2012)  . BREAST LUMPECTOMY Left 1995  . CHOLECYSTECTOMY  1992  . COLONOSCOPY  12/06/93   Dr. Hayes:single diminutive polyp of the descending colon/extrernal hemorrhoids, benign path  . COLONOSCOPY  11/30/98   Dr Hayes:small rectal polyp/internal hemorrhoids, benign path  . COLONOSCOPY  11/26/2006   Dr. Hayes:sigmoid polyp/small interal hemorrhoids, adenomatous  . COLONOSCOPY N/A 03/30/2016   Dr. Oneida Alar: normal TI, 4 mm rectal polyp, multiple small and large-mouthed diverticula in rectosigmoid and sigmoid colon, non-bleeding internal hemorrhoids, tubular adenoma on path. surveillance in 5 yeras  . COLONOSCOPY WITH ESOPHAGOGASTRODUODENOSCOPY (EGD) N/A 07/01/2012   Dr.  FIelds: 2 small adenomas, sigmoid colon diverticula, surveillance in 2019  . DILATION AND CURETTAGE OF UTERUS  1968  . ESOPHAGEAL DILATION  07/01/2012   Dr. Oneida Alar: proximal esophageal web, Schatzki's ring at GE junction, s/p dilaiton. Mild non-erosive gastritis. Repeat EGD July 23, 2012.   . ESOPHAGOGASTRODUODENOSCOPY  1994   Dr. Cristina Gong: small hiatal hernia and Schatzki's ring widely patent, CLO test negative  .  ESOPHAGOGASTRODUODENOSCOPY (EGD) WITH ESOPHAGEAL DILATION N/A 07/23/2012   Dr. Fields:proximal esophageal web v. radiation induced stricture/schatzki ring/small HH, s/p dilation  . MASTECTOMY COMPLETE / SIMPLE Left 05/08/2012  . MASTECTOMY COMPLETE / SIMPLE W/ SENTINEL NODE BIOPSY Right 05/08/2012  . SALPINGOOPHORECTOMY Bilateral 01/30/2013   Procedure: SALPINGO OOPHORECTOMY;  Surgeon: Melina Schools, MD;  Location: [facility];  Service: Gynecology;  Laterality: Bilateral;  . SIMPLE MASTECTOMY WITH AXILLARY SENTINEL NODE BIOPSY Right 05/08/2012   Procedure: RIGHT TOTAL MASTECTOMY WITH AXILLARY SENTINEL NODE BIOPSY;  Surgeon: Haywood Lasso, MD;  Location: [facility];  Service: General;  Laterality: Right;  . SIMPLE MASTECTOMY WITH AXILLARY SENTINEL NODE BIOPSY Left 05/08/2012   Procedure: LEFT TOTAL MASTECTOMY;  Surgeon: Haywood Lasso, MD;  Location: [facility];  Service: General;  Laterality: Left;  . TONSILLECTOMY AND ADENOIDECTOMY  ~ 2008  . TUBAL LIGATION  1976  . UVULOPALATOPHARYNGOPLASTY, TONSILLECTOMY AND SEPTOPLASTY  ~ 2008     SOCIAL HISTORY:  Social History   Socioeconomic History  . Marital status: Married    Spouse name: Not on file  . Number of children: 2  . Years of education: 12th grade  . Highest education level: Not on file  Occupational History  . Occupation: retired    Fish farm manager: [employer]  . Financial resource strain: Not on file  . Food insecurity    Worry: Not on file    Inability: Not on file  . Transportation needs    Medical: Not on file    Non-medical: Not on file  Tobacco Use  . Smoking status: Never Smoker  . Smokeless tobacco: Never Used  Substance and Sexual Activity  . Alcohol use: Yes    Comment: 05/08/2012 "margarita or glass of wine once or twice/yr"  . Drug use: No  . Sexual activity: Never  Lifestyle  . Physical activity    Days per week: Not on file    Minutes per session: Not on file  . Stress: Not on file  Relationships   . Social Herbalist on phone: Not on file    Gets together: Not on file    Attends religious service: Not on file    Active member of club or organization: Not on file    Attends meetings of clubs or organizations: Not on file    Relationship status: Not on file  . Intimate partner violence    Fear of current or ex partner: Not on file    Emotionally abused: Not on file    Physically abused: Not on file    Forced sexual activity: Not on file  Other Topics Concern  . Not on file  Social History Narrative  . Not on file    FAMILY HISTORY:  Family History  Problem Relation Age of Onset  . Lung cancer Mother 67       smoker  . Leukemia Father 68       Hairy Cell at 13, CLL at 68  . Lymphoma Father 48       Hodgkins  lymphoma  . Lung cancer Sister 7  . Thyroid cancer Brother 46       Medullary  . Lymphoma Brother 60       Follicular lymphoma  . Breast cancer Maternal Aunt        mother's paternal half sister  . Lung cancer Maternal Uncle        mother's paternal half brother  . Lung cancer Maternal Grandmother        smoker  . Other Daughter        leiomyoma of esophagus  . Colon cancer Maternal Aunt        mother's maternal half sister  . Cancer Paternal Uncle        oral cancer dx in his 53s  . Lung cancer Cousin        paternal cousin  . Breast cancer Cousin        paternal cousin died in her 29s    CURRENT MEDICATIONS:  Outpatient Encounter Medications as of 07/04/2018  Medication Sig  . acetaminophen (TYLENOL) 500 MG tablet Take 1,000 mg by mouth daily as needed for moderate pain or headache.  Marland Kitchen CALCIUM PO Take by mouth.  . calcium-vitamin D (OSCAL WITH D) 500-200 MG-UNIT tablet Take 1 tablet by mouth.  . Carboxymethylcellul-Glycerin (LUBRICATING EYE DROPS OP) Apply 1 drop to eye daily as needed (dry eyes).  . carboxymethylcellul-glycerin (OPTIVE) 0.5-0.9 % ophthalmic solution Apply to eye.  . Cholecalciferol (VITAMIN D) 2000 units CAPS Take 2,000  Units by mouth at bedtime.  Marland Kitchen exemestane (AROMASIN) 25 MG tablet Take 1 tablet (25 mg total) by mouth daily after breakfast.  . hydrocortisone (PROCTOZONE-HC) 2.5 % rectal cream Place 1 application rectally 2 (two) times daily. For 7 days  . lisinopril (PRINIVIL,ZESTRIL) 20 MG tablet Take 20 mg by mouth daily.  . metoprolol succinate (TOPROL-XL) 25 MG 24 hr tablet Take 25 mg by mouth daily.  . Multiple Vitamin (MULTIVITAMIN WITH MINERALS) TABS Take 1 tablet by mouth at bedtime.   Marland Kitchen omeprazole (PRILOSEC) 20 MG capsule TAKE ONE CAPSULE BY MOUTH ONCE DAILY.  Marland Kitchen ondansetron (ZOFRAN ODT) 4 MG disintegrating tablet Take 1 tablet (4 mg total) by mouth every 8 (eight) hours as needed for nausea or vomiting.  . venlafaxine (EFFEXOR) 50 MG tablet Take 50 mg by mouth 2 (two) times daily.  . [DISCONTINUED] ciprofloxacin (CIPRO) 500 MG tablet Take 1 tablet (500 mg total) by mouth 2 (two) times daily.  . [DISCONTINUED] Multiple Vitamins-Minerals (MULTIVITAMIN ADULT EXTRA C PO) Take by mouth.  . [DISCONTINUED] metroNIDAZOLE (FLAGYL) 500 MG tablet Take 1 tablet (500 mg total) by mouth 3 (three) times daily.   No facility-administered encounter medications on file as of 07/04/2018.     ALLERGIES:  Allergies  Allergen Reactions  . Penicillins Rash    Has patient had a PCN reaction causing immediate rash, facial/tongue/throat swelling, SOB or lightheadedness with hypotension: Yes Has patient had a PCN reaction causing severe rash involving mucus membranes or skin necrosis: No Has patient had a PCN reaction that required hospitalization No Has patient had a PCN reaction occurring within the last 10 years: No If all of the above answers are "NO", then may proceed with Cephalosporin use.      PHYSICAL EXAM:  ECOG Performance status: 1  Vitals:   07/04/18 1412  BP: (!) 149/55  Pulse: 77  Resp: 18  Temp: (!) 97.3 F (36.3 C)  SpO2: 99%   Filed Weights   07/04/18 1412  Weight: 159 lb 6.4 oz (72.3 kg)     Physical Exam Vitals signs reviewed.  Constitutional:      Appearance: Normal appearance.  Cardiovascular:     Rate and Rhythm: Normal rate and regular rhythm.     Heart sounds: Normal heart sounds.  Pulmonary:     Effort: Pulmonary effort is normal.     Breath sounds: Normal breath sounds.  Abdominal:     General: There is no distension.     Palpations: Abdomen is soft. There is no mass.  Musculoskeletal:        General: No swelling.  Skin:    General: Skin is warm.  Neurological:     General: No focal deficit present.     Mental Status: She is alert and oriented to person, place, and time.  Psychiatric:        Mood and Affect: Mood normal.        Behavior: Behavior normal.   Bilateral mastectomy sites are within normal limits.   LABORATORY DATA:  I have reviewed the labs as listed.  CBC    Component Value Date/Time   WBC 7.2 07/04/2018 1341   RBC 4.09 07/04/2018 1341   HGB 12.9 07/04/2018 1341   HCT 39.7 07/04/2018 1341   PLT 292 07/04/2018 1341   MCV 97.1 07/04/2018 1341   MCH 31.5 07/04/2018 1341   MCHC 32.5 07/04/2018 1341   RDW 12.6 07/04/2018 1341   LYMPHSABS 2.6 07/04/2018 1341   MONOABS 0.4 07/04/2018 1341   EOSABS 0.1 07/04/2018 1341   BASOSABS 0.0 07/04/2018 1341   CMP Latest Ref Rng & Units 07/04/2018 01/03/2018 07/02/2017  Glucose 70 - 99 mg/dL 140(H) 193(H) 158(H)  BUN 8 - 23 mg/dL _0 Creatinine 0.44 - 1.00 mg/dL 0.91 0.88 0.89  Sodium 135 - 145 mmol/L 140 138 137  Potassium 3.5 - 5.1 mmol/L 4.3 4.3 3.7  Chloride 98 - 111 mmol/L 106 104 102  CO2 22 - 32 mmol/L _1 Calcium 8.9 - 10.3 mg/dL 9.6 10.1 9.7  Total Protein 6.5 - 8.1 g/dL 7.5 7.6 7.5  Total Bilirubin 0.3 - 1.2 mg/dL 0.7 0.3 0.9  Alkaline Phos 38 - 126 U/L 33(L) 35(L) 41  AST 15 - 41 U/L 24 50(H) 37  ALT 0 - 44 U/L 27 61(H) 45(H)       DIAGNOSTIC IMAGING:  I have independently reviewed images of the CT scan of the chest dated 06/11/2017.  I have also reviewed reports  of MRI.  I discussed the results with the patient in detail.     ASSESSMENT & PLAN:   Breast cancer, right breast, IDC, Multifocal 1.  Stage Ia (T1CN0) right breast IDC: -1.2 cm, grade 2, ER/PR positive, HER-2 negative, positive LVI, Ki-67 of 60%, 0/4 lymph nodes positive, Oncotype DX of 14. -Status post bilateral mastectomy on 05/08/2012, bilateral salpingo-oophorectomy on 01/30/2013, Aromasin started on 06/03/2012 -PALB2 and CHEK2 mutation positive. - She is continuing Aromasin beyond 5 years.  She is tolerating it very well.  Denies any musculoskeletal symptoms or vasomotor symptoms. -Bilateral mastectomy sites are within normal limits. -Last MRI of the breast dated 06/25/2017 was BI-RADS Category 1.  We will schedule another bilateral breast MRI with and without contrast. - She will come back in 6 months for follow-up.  2.  Lung nodule: -CT scan of the chest on 06/11/2017 shows left upper lobe lung nodule.  Will order another CT scan of the chest without contrast prior  to next visit.  3.  Osteopenia: -DEXA scan on 04/20/2016 shows T score of -2.1. -She was started on Prolia in 2016, continuing every 6 months.  She is tolerating it very well. -She will continue calcium and vitamin D.  We will schedule him for a bone density test prior to next visit.  4.  Left breast cancer: -Diagnosed in 1996, underwent lumpectomy, CMF x8 cycles followed by radiation therapy.  5.  Family history: -Brother had thyroid cancer, another brother had lymphoma, sister had lung cancer and was a non-smoker.  Father had leukemia.  Mother had lung cancer and was a smoker.  First cousin had breast cancer.  Daughter at age 59 had breast cancer.  Daughter was also reportedly positive for PALB2.   Total time spent is 25 minutes with more than 50% of time spent face-to-face discussing treatment plan, counseling and coordination of care.  Orders placed this encounter:  Orders Placed This Encounter  Procedures  . DG  Bone Density  . CT CHEST WO CONTRAST  . MR BREAST BILATERAL W WO CONTRAST INC CAD  . CBC with Differential/Platelet  . Comprehensive metabolic panel      Derek Jack, MD [place] [phone]

## 2018-07-04 NOTE — Progress Notes (Signed)
[place]run[place] reviewed with and pt seen by Dr. Delton Coombes and pt approved for Prolia injection today per MD                    [name] tolerated Prolia injection well without complaints or incident. Calcium 9.6 today and pt denied any tooth or jaw pain and no recent or future dental visits. Pt continues to take her Calcium PO as prescribed without issues. Pt discharged self ambulatory in satisfactory condition

## 2018-07-04 NOTE — Patient Instructions (Signed)
[facility] at [facility] Discharge Instructions  You were seen today by Dr. Katragadda. He went over your recent lab results. He will see you back in 6 months for labs and follow up.   Thank you for choosing [facility] at [facility] to provide your oncology and hematology care.  To afford each patient quality time with our provider, please arrive at least 15 minutes before your scheduled appointment time.   If you have a lab appointment with the Cancer Center please come in thru the  Main Entrance and check in at the main information desk  You need to re-schedule your appointment should you arrive 10 or more minutes late.  We strive to give you quality time with our providers, and arriving late affects you and other patients whose appointments are after yours.  Also, if you no show three or more times for appointments you may be dismissed from the clinic at the providers discretion.     Again, thank you for choosing [facility].  Our hope is that these requests will decrease the amount of time that you wait before being seen by our physicians.       _____________________________________________________________  Should you have questions after your visit to [facility], please contact our office at [phone] between the hours of 8:00 a.m. and 4:30 p.m.  Voicemails left after 4:00 p.m. will not be returned until the following business day.  For prescription refill requests, have your pharmacy contact our office and allow 72 hours.    Cancer Center Support Programs:   > Cancer Support Group  2nd Tuesday of the month 1pm-2pm, Journey Room    

## 2018-07-04 NOTE — Patient Instructions (Signed)
[facility] at [facility] Discharge Instructions  Received Prolia injection today. Follow-up as scheduled. Call clinic for any questions or concerns   Thank you for choosing [facility] at [facility] to provide your oncology and hematology care.  To afford each patient quality time with our provider, please arrive at least 15 minutes before your scheduled appointment time.   If you have a lab appointment with the Cancer Center please come in thru the  Main Entrance and check in at the main information desk  You need to re-schedule your appointment should you arrive 10 or more minutes late.  We strive to give you quality time with our providers, and arriving late affects you and other patients whose appointments are after yours.  Also, if you no show three or more times for appointments you may be dismissed from the clinic at the providers discretion.     Again, thank you for choosing [facility].  Our hope is that these requests will decrease the amount of time that you wait before being seen by our physicians.       _____________________________________________________________  Should you have questions after your visit to [facility], please contact our office at [phone] between the hours of 8:00 a.m. and 4:30 p.m.  Voicemails left after 4:00 p.m. will not be returned until the following business day.  For prescription refill requests, have your pharmacy contact our office and allow 72 hours.    Cancer Center Support Programs:   > Cancer Support Group  2nd Tuesday of the month 1pm-2pm, Journey Room   

## 2018-07-09 ENCOUNTER — Other Ambulatory Visit: Payer: PPO

## 2018-07-09 ENCOUNTER — Other Ambulatory Visit: Payer: Self-pay

## 2018-07-09 DIAGNOSIS — Z20822 Contact with and (suspected) exposure to covid-19: Secondary | ICD-10-CM

## 2018-07-09 DIAGNOSIS — R6889 Other general symptoms and signs: Secondary | ICD-10-CM | POA: Diagnosis not present

## 2018-07-10 ENCOUNTER — Other Ambulatory Visit: Payer: Self-pay

## 2018-07-10 ENCOUNTER — Emergency Department (HOSPITAL_COMMUNITY): Payer: PPO

## 2018-07-10 ENCOUNTER — Emergency Department (HOSPITAL_COMMUNITY)
Admission: EM | Admit: 2018-07-10 | Discharge: 2018-07-10 | Disposition: A | Discharge status: Home / Self Care | Payer: PPO | Attending: Emergency Medicine | Admitting: Emergency Medicine

## 2018-07-10 ENCOUNTER — Encounter (HOSPITAL_COMMUNITY): Payer: Self-pay

## 2018-07-10 DIAGNOSIS — K5900 Constipation, unspecified: Secondary | ICD-10-CM | POA: Insufficient documentation

## 2018-07-10 DIAGNOSIS — I10 Essential (primary) hypertension: Secondary | ICD-10-CM | POA: Insufficient documentation

## 2018-07-10 DIAGNOSIS — R109 Unspecified abdominal pain: Secondary | ICD-10-CM | POA: Diagnosis present

## 2018-07-10 DIAGNOSIS — R55 Syncope and collapse: Secondary | ICD-10-CM | POA: Diagnosis not present

## 2018-07-10 DIAGNOSIS — R1084 Generalized abdominal pain: Secondary | ICD-10-CM | POA: Diagnosis not present

## 2018-07-10 DIAGNOSIS — Z209 Contact with and (suspected) exposure to unspecified communicable disease: Secondary | ICD-10-CM | POA: Diagnosis not present

## 2018-07-10 DIAGNOSIS — E119 Type 2 diabetes mellitus without complications: Secondary | ICD-10-CM | POA: Insufficient documentation

## 2018-07-10 DIAGNOSIS — Z853 Personal history of malignant neoplasm of breast: Secondary | ICD-10-CM | POA: Insufficient documentation

## 2018-07-10 DIAGNOSIS — R52 Pain, unspecified: Secondary | ICD-10-CM | POA: Diagnosis not present

## 2018-07-10 DIAGNOSIS — K6289 Other specified diseases of anus and rectum: Secondary | ICD-10-CM | POA: Diagnosis not present

## 2018-07-10 DIAGNOSIS — K5730 Diverticulosis of large intestine without perforation or abscess without bleeding: Secondary | ICD-10-CM | POA: Diagnosis not present

## 2018-07-10 LAB — CBC WITH DIFFERENTIAL/PLATELET
Abs Immature Granulocytes: 0.08 10*3/uL — ABNORMAL HIGH (ref 0.00–0.07)
Basophils Absolute: 0 10*3/uL (ref 0.0–0.1)
Basophils Relative: 0 %
Eosinophils Absolute: 0 10*3/uL (ref 0.0–0.5)
Eosinophils Relative: 0 %
HCT: 39.2 % (ref 36.0–46.0)
Hemoglobin: 12.5 g/dL (ref 12.0–15.0)
Immature Granulocytes: 1 %
Lymphocytes Relative: 8 %
Lymphs Abs: 1.4 10*3/uL (ref 0.7–4.0)
MCH: 31.3 pg (ref 26.0–34.0)
MCHC: 31.9 g/dL (ref 30.0–36.0)
MCV: 98.2 fL (ref 80.0–100.0)
Monocytes Absolute: 0.9 10*3/uL (ref 0.1–1.0)
Monocytes Relative: 6 %
Neutro Abs: 14.4 10*3/uL — ABNORMAL HIGH (ref 1.7–7.7)
Neutrophils Relative %: 85 %
Platelets: 295 10*3/uL (ref 150–400)
RBC: 3.99 MIL/uL (ref 3.87–5.11)
RDW: 12.6 % (ref 11.5–15.5)
WBC: 16.8 10*3/uL — ABNORMAL HIGH (ref 4.0–10.5)
nRBC: 0 % (ref 0.0–0.2)

## 2018-07-10 LAB — URINALYSIS, ROUTINE W REFLEX MICROSCOPIC
Bilirubin Urine: NEGATIVE
Glucose, UA: NEGATIVE mg/dL
Hgb urine dipstick: NEGATIVE
Ketones, ur: NEGATIVE mg/dL
Nitrite: NEGATIVE
Protein, ur: NEGATIVE mg/dL
Specific Gravity, Urine: 1.023 (ref 1.005–1.030)
pH: 5 (ref 5.0–8.0)

## 2018-07-10 LAB — COMPREHENSIVE METABOLIC PANEL
ALT: 24 U/L (ref 0–44)
AST: 22 U/L (ref 15–41)
Albumin: 4.2 g/dL (ref 3.5–5.0)
Alkaline Phosphatase: 38 U/L (ref 38–126)
Anion gap: 10 (ref 5–15)
BUN: 22 mg/dL (ref 8–23)
CO2: 25 mmol/L (ref 22–32)
Calcium: 9.1 mg/dL (ref 8.9–10.3)
Chloride: 102 mmol/L (ref 98–111)
Creatinine, Ser: 0.92 mg/dL (ref 0.44–1.00)
GFR calc Af Amer: >60 mL/min (ref >60)
GFR calc non Af Amer: >60 mL/min (ref >60)
Glucose, Bld: 152 mg/dL — ABNORMAL HIGH (ref 70–99)
Potassium: 4.1 mmol/L (ref 3.5–5.1)
Sodium: 137 mmol/L (ref 135–145)
Total Bilirubin: 0.6 mg/dL (ref 0.3–1.2)
Total Protein: 7.4 g/dL (ref 6.5–8.1)

## 2018-07-10 LAB — LIPASE, BLOOD: Lipase: 32 U/L (ref 11–51)

## 2018-07-10 MED ORDER — POLYETHYLENE GLYCOL 3350 17 G PO PACK: 17.0000 g | PACK | Freq: Every day | ORAL | 0 refills | Status: DC | PRN

## 2018-07-10 MED ORDER — CIPROFLOXACIN HCL 250 MG PO TABS
500.0000 mg | ORAL_TABLET | Freq: Once | ORAL | Status: AC
  Administered 2018-07-10: 22:00:00 500 mg via ORAL
  Filled 2018-07-10: qty 2

## 2018-07-10 MED ORDER — ONDANSETRON HCL 4 MG/2ML IJ SOLN
4.0000 mg | Freq: Once | INTRAMUSCULAR | Status: AC
  Administered 2018-07-10: 4 mg via INTRAVENOUS
  Filled 2018-07-10: qty 2

## 2018-07-10 MED ORDER — METRONIDAZOLE 500 MG PO TABS
500.0000 mg | ORAL_TABLET | Freq: Once | ORAL | Status: AC
  Administered 2018-07-10: 500 mg via ORAL
  Filled 2018-07-10: qty 1

## 2018-07-10 MED ORDER — MORPHINE SULFATE (PF) 4 MG/ML IV SOLN
4.0000 mg | Freq: Once | INTRAVENOUS | Status: AC
  Administered 2018-07-10: 4 mg via INTRAVENOUS
  Filled 2018-07-10: qty 1

## 2018-07-10 MED ORDER — METRONIDAZOLE 500 MG PO TABS: 500.0000 mg | ORAL_TABLET | Freq: Three times a day (TID) | ORAL | 0 refills | Status: DC

## 2018-07-10 MED ORDER — IOHEXOL 300 MG/ML  SOLN
100.0000 mL | Freq: Once | INTRAMUSCULAR | Status: AC | PRN
  Administered 2018-07-10: 100 mL via INTRAVENOUS

## 2018-07-10 MED ORDER — CIPROFLOXACIN HCL 500 MG PO TABS: 500.0000 mg | ORAL_TABLET | Freq: Two times a day (BID) | ORAL | 0 refills | Status: DC

## 2018-07-10 MED ORDER — HYDROCODONE-ACETAMINOPHEN 5-325 MG PO TABS: 1.0000 | ORAL_TABLET | ORAL | 0 refills | Status: DC | PRN

## 2018-07-10 MED ORDER — ONDANSETRON 4 MG PO TBDP: 4.0000 mg | ORAL_TABLET | Freq: Three times a day (TID) | ORAL | 0 refills | Status: DC | PRN

## 2018-07-10 NOTE — Discharge Instructions (Addendum)
Your CT scan reveals you have proctitis which means inflammation and probable infection in your distal colon/rectum.  Complete the entire course of the antibiotics prescribed taking your next dose tomorrow morning.  You may also use the pain medicine prescribed, but use caution as this medicine will make you drowsy, do not drive within 4 hours of taking this medication.

## 2018-07-10 NOTE — ED Triage Notes (Addendum)
Pt reports that her abdomen began hurting 1 pm today and she took 2 dulcolax at 3 pm and had a large BM. Feels nauseated. Pt reports decrease abdominal cramping . Pt reports straining to have BM and felt dizzy and lightheaded, but has resolved now

## 2018-07-10 NOTE — ED Provider Notes (Signed)
[facility] DEPARTMENT Provider Note   CSN: [id number] Arrival date & time: 07/10/18  1721    History   Chief Complaint Chief Complaint  Patient presents with  . Abdominal Pain    HPI Amy Houston is a 73 y.o. female with a history of DM, GERD, HTN, IBS, diarrhea prominent, h/o diverticulitis and distant breast cancer presenting with abdominal pain and constipation which started around 1 PM today.  She describes generalized waxing and waning sharp and cramping pain in association with constipation.  She took 2 doses of Dulcolax, had a large bowel movement and her pain improved.  However her nausea continued but is mild at this time.  While she was straining to have her BM she felt lightheaded but this symptom has resolved also.  She denies dysuria or hematuria, she had no rectal bleeding with this BM.  Denies back and flank pain.  She typically has diarrhea predominant IBS, constipation is very abnormal for her.  She denies any recent dietary or medication changes.     The history is provided by the patient.    Past Medical History:  Diagnosis Date  . Abnormal genetic test 04/09/2013   PALB2 and CHEK2 positivity   . Arthritis    "of the spine" (05/08/2012)  . Breast cancer (HCC) 1995   "left" (05/08/2012)  . Breast cancer, right breast, IDC, Multifocal 03/12/2012   Multifocal, 10 and 8 left breast   . Diabetes mellitus 1999   "dx; never on meds" (05/08/2012)  . Exertional shortness of breath   . GERD (gastroesophageal reflux disease)    tums  . H/O hiatal hernia   . Hemorrhoid   . History of blood transfusion    post child birth  . HTN (hypertension)   . IBS (irritable bowel syndrome)   . Osteopenia 12/11/2012   On Ca++ and Vit D. Bone density in April 2014.  Next bone density due in April 2016.  . PONV (postoperative nausea and vomiting)   . PPD positive, treated 1999    Patient Active Problem List   Diagnosis Date Noted  . Rectal bleeding 03/14/2016  .  Diverticulitis of colon 03/08/2015  . LLQ abdominal pain 01/22/2015  . Genetic testing 09/10/2014  . Acquired trigger finger 05/06/2013  . Abnormal genetic test 04/09/2013  . Osteopenia 12/11/2012  . Thyroid nodule 06/21/2012  . GERD (gastroesophageal reflux disease) 06/03/2012  . Adenomatous polyps 06/03/2012  . Breast cancer, right breast, IDC, Multifocal 03/12/2012  . Popliteal cyst 08/18/2010  . Hx Left breast cancer, UOQ, IDC, receptor negative 11/08/1993    Past Surgical History:  Procedure Laterality Date  . ABDOMINAL HYSTERECTOMY Bilateral 01/30/2013   Procedure: HYSTERECTOMY ABDOMINAL;  Surgeon: Thomas F Henley, MD;  Location: [facility];  Service: Gynecology;  Laterality: Bilateral;  . BREAST BIOPSY Left 1995  . BREAST BIOPSY Bilateral 03/2012   "one on the left; 2 on the right" (05/08/2012)  . BREAST LUMPECTOMY Left 1995  . CHOLECYSTECTOMY  1992  . COLONOSCOPY  12/06/93   Dr. Hayes:single diminutive polyp of the descending colon/extrernal hemorrhoids, benign path  . COLONOSCOPY  11/30/98   Dr Hayes:small rectal polyp/internal hemorrhoids, benign path  . COLONOSCOPY  11/26/2006   Dr. Hayes:sigmoid polyp/small interal hemorrhoids, adenomatous  . COLONOSCOPY N/A 03/30/2016   Dr. Fields: normal TI, 4 mm rectal polyp, multiple small and large-mouthed diverticula in rectosigmoid and sigmoid colon, non-bleeding internal hemorrhoids, tubular adenoma on path. surveillance in 5 yeras  . COLONOSCOPY WITH ESOPHAGOGASTRODUODENOSCOPY (EGD)   N/A 07/01/2012   Dr. FIelds: 2 small adenomas, sigmoid colon diverticula, surveillance in 2019  . DILATION AND CURETTAGE OF UTERUS  1968  . ESOPHAGEAL DILATION  07/01/2012   Dr. Fields: proximal esophageal web, Schatzki's ring at GE junction, s/p dilaiton. Mild non-erosive gastritis. Repeat EGD July 23, 2012.   . ESOPHAGOGASTRODUODENOSCOPY  1994   Dr. Buccini: small hiatal hernia and Schatzki's ring widely patent, CLO test negative  .  ESOPHAGOGASTRODUODENOSCOPY (EGD) WITH ESOPHAGEAL DILATION N/A 07/23/2012   Dr. Fields:proximal esophageal web v. radiation induced stricture/schatzki ring/small HH, s/p dilation  . MASTECTOMY COMPLETE / SIMPLE Left 05/08/2012  . MASTECTOMY COMPLETE / SIMPLE W/ SENTINEL NODE BIOPSY Right 05/08/2012  . SALPINGOOPHORECTOMY Bilateral 01/30/2013   Procedure: SALPINGO OOPHORECTOMY;  Surgeon: Thomas F Henley, MD;  Location: [facility];  Service: Gynecology;  Laterality: Bilateral;  . SIMPLE MASTECTOMY WITH AXILLARY SENTINEL NODE BIOPSY Right 05/08/2012   Procedure: RIGHT TOTAL MASTECTOMY WITH AXILLARY SENTINEL NODE BIOPSY;  Surgeon: Christian J Streck, MD;  Location: [facility];  Service: General;  Laterality: Right;  . SIMPLE MASTECTOMY WITH AXILLARY SENTINEL NODE BIOPSY Left 05/08/2012   Procedure: LEFT TOTAL MASTECTOMY;  Surgeon: Christian J Streck, MD;  Location: [facility];  Service: General;  Laterality: Left;  . TONSILLECTOMY AND ADENOIDECTOMY  ~ 2008  . TUBAL LIGATION  1976  . UVULOPALATOPHARYNGOPLASTY, TONSILLECTOMY AND SEPTOPLASTY  ~ 2008     OB History   No obstetric history on file.      Home Medications    Prior to Admission medications   Medication Sig Start Date End Date Taking? Authorizing Provider  acetaminophen (TYLENOL) 500 MG tablet Take 1,000 mg by mouth daily as needed for moderate pain or headache.   Yes [provider]  calcium-vitamin D (OSCAL WITH D) 500-200 MG-UNIT tablet Take 1 tablet by mouth daily.    Yes [provider]  Cholecalciferol (VITAMIN D) 2000 units CAPS Take 2,000 Units by mouth at bedtime.   Yes [provider]  exemestane (AROMASIN) 25 MG tablet Take 1 tablet (25 mg total) by mouth daily after breakfast. 09/11/17  Yes Katragadda, Sreedhar, MD  lisinopril (PRINIVIL,ZESTRIL) 20 MG tablet Take 20 mg by mouth daily.   Yes [provider]  metoprolol succinate (TOPROL-XL) 25 MG 24 hr tablet Take 25 mg by mouth daily.   Yes [provider]  Multiple Vitamin (MULTIVITAMIN WITH MINERALS) TABS Take 1 tablet by mouth at bedtime.    Yes [provider]  omeprazole (PRILOSEC) 20 MG capsule TAKE ONE CAPSULE BY MOUTH ONCE DAILY. Patient taking differently: Take 20 mg by mouth daily.  01/16/18  Yes Boone, Anna W, NP  venlafaxine (EFFEXOR) 50 MG tablet Take 50 mg by mouth 2 (two) times daily.   Yes [provider]  ciprofloxacin (CIPRO) 500 MG tablet Take 1 tablet (500 mg total) by mouth every 12 (twelve) hours. 07/10/18   , , PA-C  HYDROcodone-acetaminophen (NORCO/VICODIN) 5-325 MG tablet Take 1 tablet by mouth every 4 (four) hours as needed. 07/10/18   , , PA-C  metroNIDAZOLE (FLAGYL) 500 MG tablet Take 1 tablet (500 mg total) by mouth 3 (three) times daily. 07/10/18   , , PA-C  ondansetron (ZOFRAN ODT) 4 MG disintegrating tablet Take 1 tablet (4 mg total) by mouth every 8 (eight) hours as needed for nausea or vomiting. 07/10/18   , , PA-C  polyethylene glycol (MIRALAX) 17 g packet Take 17 g by mouth daily as needed for mild constipation. 07/10/18     , , PA-C    Family History Family History  Problem Relation Age of Onset  . Lung cancer Mother 60       smoker  . Leukemia Father 67       Hairy Cell at 67, CLL at 72  . Lymphoma Father 82       Hodgkins lymphoma  . Lung cancer Sister 57  . Thyroid cancer Brother 46       Medullary  . Lymphoma Brother 60       Follicular lymphoma  . Breast cancer Maternal Aunt        mother's paternal half sister  . Lung cancer Maternal Uncle        mother's paternal half brother  . Lung cancer Maternal Grandmother        smoker  . Other Daughter        leiomyoma of esophagus  . Colon cancer Maternal Aunt        mother's maternal half sister  . Cancer Paternal Uncle        oral cancer dx in his 60s  . Lung cancer Cousin        paternal cousin  . Breast cancer Cousin        paternal cousin died in her 60s    Social History Social  History   Tobacco Use  . Smoking status: Never Smoker  . Smokeless tobacco: Never Used  Substance Use Topics  . Alcohol use: Yes    Comment: 05/08/2012 "margarita or glass of wine once or twice/yr"  . Drug use: No     Allergies   Penicillins   Review of Systems Review of Systems  Constitutional: Negative for chills and fever.  HENT: Negative for congestion and sore throat.   Eyes: Negative.   Respiratory: Negative for chest tightness and shortness of breath.   Cardiovascular: Negative for chest pain.  Gastrointestinal: Positive for abdominal pain, constipation and nausea.  Genitourinary: Negative.   Musculoskeletal: Negative.  Negative for arthralgias, joint swelling and neck pain.  Skin: Negative.  Negative for rash and wound.  Neurological: Negative for dizziness, weakness, light-headedness, numbness and headaches.  Psychiatric/Behavioral: Negative.      Physical Exam Updated Vital Signs BP (!) 141/54   Pulse 89   Temp 99 F (37.2 C) (Oral)   Resp 16   Wt 72 kg   SpO2 98%   BMI 31.52 kg/m   Physical Exam Vitals signs and nursing note reviewed.  Constitutional:      Appearance: She is well-developed.  HENT:     Head: Normocephalic and atraumatic.  Eyes:     Conjunctiva/sclera: Conjunctivae normal.  Neck:     Musculoskeletal: Normal range of motion.  Cardiovascular:     Rate and Rhythm: Normal rate and regular rhythm.     Heart sounds: Normal heart sounds.  Pulmonary:     Effort: Pulmonary effort is normal.     Breath sounds: Normal breath sounds. No wheezing.  Abdominal:     General: Bowel sounds are normal.     Palpations: Abdomen is soft.     Tenderness: There is no abdominal tenderness. There is no guarding or rebound.  Musculoskeletal: Normal range of motion.  Skin:    General: Skin is warm and dry.  Neurological:     Mental Status: She is alert.      ED Treatments / Results  Labs (all labs ordered are listed, but only abnormal results are  displayed) Labs Reviewed    URINALYSIS, ROUTINE W REFLEX MICROSCOPIC - Abnormal; Notable for the following components:      Result Value   Leukocytes,Ua TRACE (*)    Bacteria, UA RARE (*)    All other components within normal limits  CBC WITH DIFFERENTIAL/PLATELET - Abnormal; Notable for the following components:   WBC 16.8 (*)    Neutro Abs 14.4 (*)    Abs Immature Granulocytes 0.08 (*)    All other components within normal limits  COMPREHENSIVE METABOLIC PANEL - Abnormal; Notable for the following components:   Glucose, Bld 152 (*)    All other components within normal limits  LIPASE, BLOOD    EKG ED ECG REPORT   Date: 07/10/2018  Rate: 82  Rhythm: normal sinus rhythm  QRS Axis: normal  Intervals: normal  ST/T Wave abnormalities: normal  Conduction Disutrbances:none  Narrative Interpretation:   Old EKG Reviewed: unchanged  I have personally reviewed the EKG tracing and agree with the computerized printout as noted.   Radiology Ct Abdomen Pelvis W Contrast  Result Date: 07/10/2018 CLINICAL DATA:  Abdominal pain, diverticulitis suspected EXAM: CT ABDOMEN AND PELVIS WITH CONTRAST TECHNIQUE: Multidetector CT imaging of the abdomen and pelvis was performed using the standard protocol following bolus administration of intravenous contrast. CONTRAST:  100mL OMNIPAQUE IOHEXOL 300 MG/ML  SOLN COMPARISON:  CT abdomen pelvis 08/01/2016 FINDINGS: Lower chest: Basilar areas of atelectasis. Coronary artery calcifications. A dense calcification in the mitral annulus. Cardiac size is normal. No visible pericardial effusion. Hepatobiliary: Normal liver. Post cholecystectomy. Mild prominence of the biliary tree likely related to reservoir affect. Pancreas: Unremarkable. No pancreatic ductal dilatation or surrounding inflammatory changes. Spleen: Normal in size without focal abnormality. Accessory splenules. Adrenals/Urinary Tract: Stable appearance of a 1.2 cm fluid attenuation cyst in the lower  pole right kidney. Mild nonspecific symmetric perinephric stranding is similar to prior exams. And should be correlated with renal function. No calcification, concerning renal mass or hydronephrosis. Urinary bladder is largely decompressed at the time of exam and therefore poorly assessed by CT imaging. Stomach/Bowel: Distal esophagus, stomach and duodenum are unremarkable. No small bowel thickening or dilatation. No evidence of obstruction. Descending and sigmoid colonic diverticulosis without focal inflammation suggests diverticulitis. There is circumferential rectal wall thickening and adjacent hazy mesorectal fat stranding with mild mucosal hyperenhancement Vascular/Lymphatic: Aortic atherosclerosis. No enlarged abdominal or pelvic lymph nodes. Reproductive: Status post hysterectomy. No adnexal masses. Other: No abdominal wall hernia or abnormality. No abdominopelvic ascites. Musculoskeletal: No acute or significant osseous findings. Degenerative changes in the spine are maximal at L1-2. IMPRESSION: Circumferential thickening in the adjacent perirectal fat stranding, compatible with a non-specific proctitis. Given circumferential appearance consider correlation with sigmoidoscopy/colonoscopy following acute therapeutic intervention if not performed recently. Colonic diverticulosis without evidence of diverticulitis. Post hysterectomy, cholecystectomy. Coronary and aortic atherosclerosis. Electronically Signed   By: MD Price  DeHay   On: 07/10/2018 21:03   Dg Abd Acute 2+v W 1v Chest  Result Date: 07/10/2018 CLINICAL DATA:  Constipation and bloating. EXAM: DG ABDOMEN ACUTE W/ 1V CHEST COMPARISON:  CT dated August 01, 2017. FINDINGS: There is no evidence of dilated bowel loops or free intraperitoneal air. No radiopaque calculi or other significant radiographic abnormality is seen. Heart size and mediastinal contours are within normal limits. Both lungs are clear. IMPRESSION: Negative abdominal radiographs.  No  acute cardiopulmonary disease. Electronically Signed   By: Christopher  Green M.D.   On: 07/10/2018 19:15    Procedures Procedures (including critical care time)  Medications Ordered in ED Medications    ondansetron (ZOFRAN) injection 4 mg (4 mg Intravenous Given 07/10/18 2009)  morphine 4 MG/ML injection 4 mg (4 mg Intravenous Given 07/10/18 2009)  iohexol (OMNIPAQUE) 300 MG/ML solution 100 mL (100 mLs Intravenous Contrast Given 07/10/18 2037)  ciprofloxacin (CIPRO) tablet 500 mg (500 mg Oral Given 07/10/18 2151)  metroNIDAZOLE (FLAGYL) tablet 500 mg (500 mg Oral Given 07/10/18 2151)     Initial Impression / Assessment and Plan / ED Course  I have reviewed the triage vital signs and the nursing notes.  Pertinent labs & imaging results that were available during my care of the patient were reviewed by me and considered in my medical decision making (see chart for details).        PT with resolved abdominal pain after dulcolax and large BM.  She has an elevated wbc count here, but no further sx.  She was advised recheck for any return or worsening sx.  She is generally IBS diarrhea predominant, unclear of reason for todays constipation.  She was advised miralax prn if sx persist.  PRN f/u anticipated.    Discussed with Dr Cook prior to dc home.  10:56 PM At time of dc, pt reported increasing abd pain, need to bm.  Ambulated to bathroom, had nonbloody emesis, then small bm with drops of BRB in toilet bowl.  Will proceed with CT scanning to assess for diverticulitis or other source of sx.   CT imaging reviewed and discussed with patient.  She has proctitis.  She was treated with antibiotics including Cipro and Flagyl.  She was also prescribed hydrocodone and Zofran for symptom relief.  Advised to add MiraLAX to keep her stools soft.  Plan follow-up with her GI specialist Dr. fields for further management of her symptoms.  Final Clinical Impressions(s) / ED Diagnoses   Final diagnoses:   Generalized abdominal pain  Constipation, unspecified constipation type  Proctitis    ED Discharge Orders         Ordered    polyethylene glycol (MIRALAX) 17 g packet  Daily PRN     07/10/18 1946    ciprofloxacin (CIPRO) 500 MG tablet  Every 12 hours     07/10/18 2253    metroNIDAZOLE (FLAGYL) 500 MG tablet  3 times daily     07/10/18 2253    ondansetron (ZOFRAN ODT) 4 MG disintegrating tablet  Every 8 hours PRN     07/10/18 2253    HYDROcodone-acetaminophen (NORCO/VICODIN) 5-325 MG tablet  Every 4 hours PRN     07/10/18 2253           , , PA-C 07/10/18 2257    Cook, Brian, MD 07/11/18 1517  

## 2018-07-12 ENCOUNTER — Ambulatory Visit (HOSPITAL_COMMUNITY): Payer: PPO

## 2018-07-13 LAB — NOVEL CORONAVIRUS, NAA: SARS-CoV-2, NAA: NOT DETECTED

## 2018-07-22 ENCOUNTER — Other Ambulatory Visit: Payer: PPO

## 2018-07-22 DIAGNOSIS — Z20822 Contact with and (suspected) exposure to covid-19: Secondary | ICD-10-CM

## 2018-07-23 ENCOUNTER — Encounter: Payer: Self-pay | Admitting: Gastroenterology

## 2018-07-25 LAB — NOVEL CORONAVIRUS, NAA: SARS-CoV-2, NAA: NOT DETECTED

## 2018-07-26 ENCOUNTER — Other Ambulatory Visit (HOSPITAL_COMMUNITY): Payer: PPO

## 2018-07-26 ENCOUNTER — Ambulatory Visit (HOSPITAL_COMMUNITY): Payer: PPO

## 2018-08-02 ENCOUNTER — Ambulatory Visit (HOSPITAL_COMMUNITY)
Admission: RE | Admit: 2018-08-02 | Discharge: 2018-08-02 | Disposition: A | Discharge status: Home / Self Care | Payer: PPO | Source: Ambulatory Visit | Attending: Hematology | Admitting: Hematology

## 2018-08-02 ENCOUNTER — Other Ambulatory Visit: Payer: Self-pay

## 2018-08-02 DIAGNOSIS — M8580 Other specified disorders of bone density and structure, unspecified site: Secondary | ICD-10-CM

## 2018-08-02 DIAGNOSIS — N6489 Other specified disorders of breast: Secondary | ICD-10-CM | POA: Diagnosis not present

## 2018-08-02 DIAGNOSIS — Z421 Encounter for breast reconstruction following mastectomy: Secondary | ICD-10-CM | POA: Diagnosis not present

## 2018-08-02 DIAGNOSIS — M8589 Other specified disorders of bone density and structure, multiple sites: Secondary | ICD-10-CM | POA: Diagnosis not present

## 2018-08-02 DIAGNOSIS — Z780 Asymptomatic menopausal state: Secondary | ICD-10-CM | POA: Diagnosis not present

## 2018-08-02 MED ORDER — GADOBUTROL 1 MMOL/ML IV SOLN
6.0000 mL | Freq: Once | INTRAVENOUS | Status: AC | PRN
  Administered 2018-08-02: 11:00:00 6 mL via INTRAVENOUS

## 2018-09-23 DIAGNOSIS — D225 Melanocytic nevi of trunk: Secondary | ICD-10-CM | POA: Diagnosis not present

## 2018-09-23 DIAGNOSIS — D485 Neoplasm of uncertain behavior of skin: Secondary | ICD-10-CM | POA: Diagnosis not present

## 2018-09-23 DIAGNOSIS — L821 Other seborrheic keratosis: Secondary | ICD-10-CM | POA: Diagnosis not present

## 2018-10-02 ENCOUNTER — Other Ambulatory Visit (HOSPITAL_COMMUNITY): Payer: Self-pay | Admitting: Hematology

## 2018-10-22 ENCOUNTER — Other Ambulatory Visit: Payer: Self-pay | Admitting: Gastroenterology

## 2018-11-08 ENCOUNTER — Other Ambulatory Visit: Payer: Self-pay | Admitting: *Deleted

## 2018-11-08 DIAGNOSIS — Z20822 Contact with and (suspected) exposure to covid-19: Secondary | ICD-10-CM

## 2018-11-09 LAB — NOVEL CORONAVIRUS, NAA: SARS-CoV-2, NAA: NOT DETECTED

## 2018-12-30 ENCOUNTER — Other Ambulatory Visit: Payer: Self-pay

## 2018-12-30 ENCOUNTER — Ambulatory Visit
Admission: EM | Admit: 2018-12-30 | Discharge: 2018-12-30 | Disposition: A | Discharge status: Home / Self Care | Payer: PPO | Attending: Emergency Medicine | Admitting: Emergency Medicine

## 2018-12-30 DIAGNOSIS — M79602 Pain in left arm: Secondary | ICD-10-CM | POA: Diagnosis not present

## 2018-12-30 DIAGNOSIS — Z20828 Contact with and (suspected) exposure to other viral communicable diseases: Secondary | ICD-10-CM

## 2018-12-30 DIAGNOSIS — I890 Lymphedema, not elsewhere classified: Secondary | ICD-10-CM

## 2018-12-30 DIAGNOSIS — Z20822 Contact with and (suspected) exposure to covid-19: Secondary | ICD-10-CM

## 2018-12-30 DIAGNOSIS — R0789 Other chest pain: Secondary | ICD-10-CM | POA: Diagnosis not present

## 2018-12-30 DIAGNOSIS — R0981 Nasal congestion: Secondary | ICD-10-CM | POA: Diagnosis not present

## 2018-12-30 NOTE — ED Triage Notes (Addendum)
Pt presents to UC w/ c/o left upper arm throbbing pain x3 days. Pt has had lymph nodes removed from left arm. Left arm is slightly more swollen than right. Pt states chest feels heavy. Denies chest pain.   Pt states she has had runny nose, slight body aches, and tiredness x1 week.

## 2018-12-30 NOTE — ED Provider Notes (Signed)
[facility]    CSN: [id number] Arrival date & time: 12/30/18  1121      History   Chief Complaint Chief Complaint  Patient presents with  . left arm pain    HPI [facility] is a 73 y.o. female.   Amy [facility] 73 years old female with history of breast cancer presented to the urgent care with a complaint of left arm pain, and chest tightness.  She localized the pain to the left arm and discomfort to her chest.  Has not tried any medication.  Report her left arm symptoms are made worse with range of motion.  Rated the pain at 5 on a scale of 1-10.  Reports chest tightness as intermittent.  Denies using any medication.  Nothing make her symptoms worse.  Denies chills, fever, nausea, vomiting, abdominal pain, chest pain.  The history is provided by the patient. No language interpreter was used.    Past Medical History:  Diagnosis Date  . Abnormal genetic test 04/09/2013   PALB2 and CHEK2 positivity   . Arthritis    "of the spine" (05/08/2012)  . Breast cancer ([place]) 1995   "left" (05/08/2012)  . Breast cancer, right breast, IDC, Multifocal 03/12/2012   Multifocal, 10 and 8 left breast   . Diabetes mellitus 1999   "dx; never on meds" (05/08/2012)  . Exertional shortness of breath   . GERD (gastroesophageal reflux disease)    tums  . H/O hiatal hernia   . Hemorrhoid   . History of blood transfusion    post child birth  . HTN (hypertension)   . IBS (irritable bowel syndrome)   . Osteopenia 12/11/2012   On Ca++ and Vit D. Bone density in April 2014.  Next bone density due in April 2016.  Marland Kitchen PONV (postoperative nausea and vomiting)   . PPD positive, treated 1999    Patient Active Problem List   Diagnosis Date Noted  . Rectal bleeding 03/14/2016  . Diverticulitis of colon 03/08/2015  . LLQ abdominal pain 01/22/2015  . Genetic testing 09/10/2014  . Acquired trigger finger 05/06/2013  . Abnormal genetic test 04/09/2013  . Osteopenia 12/11/2012  . Thyroid  nodule 06/21/2012  . GERD (gastroesophageal reflux disease) 06/03/2012  . Adenomatous polyps 06/03/2012  . Breast cancer, right breast, IDC, Multifocal 03/12/2012  . Popliteal cyst 08/18/2010  . Hx Left breast cancer, UOQ, IDC, receptor negative 11/08/1993    Past Surgical History:  Procedure Laterality Date  . ABDOMINAL HYSTERECTOMY Bilateral 01/30/2013   Procedure: HYSTERECTOMY ABDOMINAL;  Surgeon: Melina Schools, MD;  Location: [facility];  Service: Gynecology;  Laterality: Bilateral;  . BREAST BIOPSY Left 1995  . BREAST BIOPSY Bilateral 03/2012   "one on the left; 2 on the right" (05/08/2012)  . BREAST LUMPECTOMY Left 1995  . CHOLECYSTECTOMY  1992  . COLONOSCOPY  12/06/93   Dr. Hayes:single diminutive polyp of the descending colon/extrernal hemorrhoids, benign path  . COLONOSCOPY  11/30/98   Dr Hayes:small rectal polyp/internal hemorrhoids, benign path  . COLONOSCOPY  11/26/2006   Dr. Hayes:sigmoid polyp/small interal hemorrhoids, adenomatous  . COLONOSCOPY N/A 03/30/2016   Dr. Oneida Alar: normal TI, 4 mm rectal polyp, multiple small and large-mouthed diverticula in rectosigmoid and sigmoid colon, non-bleeding internal hemorrhoids, tubular adenoma on path. surveillance in 5 yeras  . COLONOSCOPY WITH ESOPHAGOGASTRODUODENOSCOPY (EGD) N/A 07/01/2012   Dr. Oneida Alar: 2 small adenomas, sigmoid colon diverticula, surveillance in 2019  . DILATION AND CURETTAGE OF UTERUS  1968  . ESOPHAGEAL DILATION  07/01/2012   Dr. Oneida Alar: proximal esophageal web, Schatzki's ring at GE junction, s/p dilaiton. Mild non-erosive gastritis. Repeat EGD July 23, 2012.   . ESOPHAGOGASTRODUODENOSCOPY  1994   Dr. Cristina Gong: small hiatal hernia and Schatzki's ring widely patent, CLO test negative  . ESOPHAGOGASTRODUODENOSCOPY (EGD) WITH ESOPHAGEAL DILATION N/A 07/23/2012   Dr. Fields:proximal esophageal web v. radiation induced stricture/schatzki ring/small HH, s/p dilation  . MASTECTOMY COMPLETE / SIMPLE Left 05/08/2012  .  MASTECTOMY COMPLETE / SIMPLE W/ SENTINEL NODE BIOPSY Right 05/08/2012  . SALPINGOOPHORECTOMY Bilateral 01/30/2013   Procedure: SALPINGO OOPHORECTOMY;  Surgeon: Melina Schools, MD;  Location: [facility];  Service: Gynecology;  Laterality: Bilateral;  . SIMPLE MASTECTOMY WITH AXILLARY SENTINEL NODE BIOPSY Right 05/08/2012   Procedure: RIGHT TOTAL MASTECTOMY WITH AXILLARY SENTINEL NODE BIOPSY;  Surgeon: Haywood Lasso, MD;  Location: [facility];  Service: General;  Laterality: Right;  . SIMPLE MASTECTOMY WITH AXILLARY SENTINEL NODE BIOPSY Left 05/08/2012   Procedure: LEFT TOTAL MASTECTOMY;  Surgeon: Haywood Lasso, MD;  Location: [facility];  Service: General;  Laterality: Left;  . TONSILLECTOMY AND ADENOIDECTOMY  ~ 2008  . TUBAL LIGATION  1976  . UVULOPALATOPHARYNGOPLASTY, TONSILLECTOMY AND SEPTOPLASTY  ~ 2008    OB History   No obstetric history on file.      Home Medications    Prior to Admission medications   Medication Sig Start Date End Date Taking? Authorizing Provider  acetaminophen (TYLENOL) 500 MG tablet Take 1,000 mg by mouth daily as needed for moderate pain or headache.    [provider]  calcium-vitamin D (OSCAL WITH D) 500-200 MG-UNIT tablet Take 1 tablet by mouth daily.     [provider]  Cholecalciferol (VITAMIN D) 2000 units CAPS Take 2,000 Units by mouth at bedtime.    [provider]  ciprofloxacin (CIPRO) 500 MG tablet Take 1 tablet (500 mg total) by mouth every 12 (twelve) hours. 07/10/18   Evalee Jefferson, PA-C  exemestane (AROMASIN) 25 MG tablet TAKE (1) TABLET ONCE DAILY IN THE MORNING, AFTER BREAKFAST. 10/02/18   Derek Jack, MD  HYDROcodone-acetaminophen (NORCO/VICODIN) 5-325 MG tablet Take 1 tablet by mouth every 4 (four) hours as needed. 07/10/18   Evalee Jefferson, PA-C  lisinopril (PRINIVIL,ZESTRIL) 20 MG tablet Take 20 mg by mouth daily.    [provider]  metoprolol succinate (TOPROL-XL) 25 MG 24 hr tablet Take 25 mg by mouth daily.     [provider]  metroNIDAZOLE (FLAGYL) 500 MG tablet Take 1 tablet (500 mg total) by mouth 3 (three) times daily. 07/10/18   Evalee Jefferson, PA-C  Multiple Vitamin (MULTIVITAMIN WITH MINERALS) TABS Take 1 tablet by mouth at bedtime.     [provider]  omeprazole (PRILOSEC) 20 MG capsule TAKE ONE CAPSULE BY MOUTH ONCE DAILY. 10/22/18   Annitta Needs, NP  ondansetron (ZOFRAN ODT) 4 MG disintegrating tablet Take 1 tablet (4 mg total) by mouth every 8 (eight) hours as needed for nausea or vomiting. 07/10/18   Evalee Jefferson, PA-C  polyethylene glycol (MIRALAX) 17 g packet Take 17 g by mouth daily as needed for mild constipation. 07/10/18   Evalee Jefferson, PA-C  venlafaxine (EFFEXOR) 50 MG tablet Take 50 mg by mouth 2 (two) times daily.    [provider]    Family History Family History  Problem Relation Age of Onset  . Lung cancer Mother 82       smoker  . Leukemia Father 32  Hairy Cell at 67, CLL at 72  . Lymphoma Father 44       Hodgkins lymphoma  . Lung cancer Sister 70  . Thyroid cancer Brother 46       Medullary  . Lymphoma Brother 60       Follicular lymphoma  . Breast cancer Maternal Aunt        mother's paternal half sister  . Lung cancer Maternal Uncle        mother's paternal half brother  . Lung cancer Maternal Grandmother        smoker  . Other Daughter        leiomyoma of esophagus  . Colon cancer Maternal Aunt        mother's maternal half sister  . Cancer Paternal Uncle        oral cancer dx in his 78s  . Lung cancer Cousin        paternal cousin  . Breast cancer Cousin        paternal cousin died in her 23s    Social History Social History   Tobacco Use  . Smoking status: Never Smoker  . Smokeless tobacco: Never Used  Substance Use Topics  . Alcohol use: Yes    Comment: 05/08/2012 "margarita or glass of wine once or twice/yr"  . Drug use: No     Allergies   Penicillins   Review of Systems Review of Systems  Constitutional:  Negative.   HENT: Negative.   Respiratory: Positive for chest tightness. Negative for apnea.   Cardiovascular: Negative.   Musculoskeletal:       Left arm pain  ROS: all other are negatives   Physical Exam Triage Vital Signs ED Triage Vitals [12/30/18 1140]  Enc Vitals Group     BP (!) 160/83     Pulse Rate 83     Resp 16     Temp 98.5 F (36.9 C)     Temp Source Oral     SpO2 95 %     Weight      Height      Head Circumference      Peak Flow      Pain Score 5     Pain Loc      Pain Edu?      Excl. in [place]?    No data found.  Updated Vital Signs BP (!) 160/83 (BP Location: Right Arm)   Pulse 83   Temp 98.5 F (36.9 C) (Oral)   Resp 16   SpO2 95%   Visual Acuity Right Eye Distance:   Left Eye Distance:   Bilateral Distance:    Right Eye Near:   Left Eye Near:    Bilateral Near:     Physical Exam Vitals reviewed.  Constitutional:      General: She is not in acute distress.    Appearance: Normal appearance. She is normal weight. She is not ill-appearing, toxic-appearing or diaphoretic.  HENT:     Head: Normocephalic.     Right Ear: Tympanic membrane, ear canal and external ear normal. There is no impacted cerumen.     Left Ear: Tympanic membrane, ear canal and external ear normal. There is no impacted cerumen.     Nose: Nose normal. No congestion.  Cardiovascular:     Rate and Rhythm: Normal rate and regular rhythm.     Pulses: Normal pulses.     Heart sounds: Normal heart sounds.  Pulmonary:     Effort:  Pulmonary effort is normal. No respiratory distress.     Breath sounds: Normal breath sounds. No wheezing or rhonchi.  Chest:     Chest wall: No tenderness.  Musculoskeletal:        General: Normal range of motion.     Right upper arm: Normal. No swelling, edema or deformity.     Left upper arm: Edema and tenderness present. No swelling, deformity or lacerations.  Neurological:     Mental Status: She is alert and oriented to person, place, and time.       Cranial Nerves: No cranial nerve deficit.     Sensory: No sensory deficit.     Motor: No weakness.     Coordination: Coordination normal.     Gait: Gait normal.      UC Treatments / Results  Labs (all labs ordered are listed, but only abnormal results are displayed) Labs Reviewed  NOVEL CORONAVIRUS, NAA    EKG   Radiology No results found.  Procedures Procedures (including critical care time)  Medications Ordered in UC Medications - No data to display  Initial Impression / Assessment and Plan / UC Course  I have reviewed the triage vital signs and the nursing notes.  Pertinent labs & imaging results that were available during my care of the patient were reviewed by me and considered in my medical decision making (see chart for details).    EKG showed normal sinus rhythm.  Advised patient to go to ED if you develop chest pain.  She is stable for discharge.  Symptom of left arm more likely of lymphedema.  Advised to use Tylenol.  Patient has an appointment coming up with cancer center.  Advised patient to keep that appointment.  COVID-19 test was ordered for rule out.  Advised patient to quarantine until results become available.  To return for worsening of symptoms.  Patient verbalized understanding of the plan of care.  Final Clinical Impressions(s) / UC Diagnoses   Final diagnoses:  Lymph edema  COVID-19 ruled out     Discharge Instructions     EKG showed normal sinus rhythm.  Advised patient to go to ED for chest pain. Advised patient to use OTC Tylenol for pain To follow-up with cancer center COVID testing ordered.  It will take between 2-7 days for test results.  Someone will contact you regarding abnormal results.    In the meantime: You should remain isolated in your home for 10 days from symptom onset Get plenty of rest and push fluids Use medications daily for symptom relief Use OTC medications like ibuprofen or tylenol as needed fever or pain Call  or go to the ED if you have any new or worsening symptoms such as fever, worsening cough, shortness of breath, chest tightness, chest pain, turning blue, changes in mental status, etc...     ED Prescriptions    None     PDMP not reviewed this encounter.   Emerson Monte, [place] 12/30/18 1232

## 2018-12-30 NOTE — Discharge Instructions (Signed)
EKG showed normal sinus rhythm.  Advised patient to go to ED for chest pain. Advised patient to use OTC Tylenol for pain To follow-up with cancer center COVID testing ordered.  It will take between 2-7 days for test results.  Someone will contact you regarding abnormal results.    In the meantime: You should remain isolated in your home for 10 days from symptom onset Get plenty of rest and push fluids Use medications daily for symptom relief Use OTC medications like ibuprofen or tylenol as needed fever or pain Call or go to the ED if you have any new or worsening symptoms such as fever, worsening cough, shortness of breath, chest tightness, chest pain, turning blue, changes in mental status, etc..Marland Kitchen

## 2019-01-01 DIAGNOSIS — H1030 Unspecified acute conjunctivitis, unspecified eye: Secondary | ICD-10-CM | POA: Diagnosis not present

## 2019-01-01 LAB — NOVEL CORONAVIRUS, NAA: SARS-CoV-2, NAA: NOT DETECTED

## 2019-01-06 ENCOUNTER — Ambulatory Visit (HOSPITAL_COMMUNITY): Payer: PPO

## 2019-01-06 ENCOUNTER — Other Ambulatory Visit (HOSPITAL_COMMUNITY): Payer: PPO

## 2019-01-07 ENCOUNTER — Ambulatory Visit (HOSPITAL_COMMUNITY): Payer: PPO

## 2019-01-07 ENCOUNTER — Ambulatory Visit (HOSPITAL_COMMUNITY): Payer: PPO | Admitting: Hematology

## 2019-01-07 ENCOUNTER — Other Ambulatory Visit (HOSPITAL_COMMUNITY): Payer: PPO

## 2019-01-08 ENCOUNTER — Other Ambulatory Visit: Payer: Self-pay

## 2019-01-08 ENCOUNTER — Encounter (INDEPENDENT_AMBULATORY_CARE_PROVIDER_SITE_OTHER): Payer: Self-pay | Admitting: Internal Medicine

## 2019-01-08 ENCOUNTER — Ambulatory Visit (INDEPENDENT_AMBULATORY_CARE_PROVIDER_SITE_OTHER): Payer: PPO | Admitting: Internal Medicine

## 2019-01-08 VITALS — BP 124/60 | HR 75 | Temp 97.5°F | Resp 15 | Ht 59.5 in | Wt 163.2 lb

## 2019-01-08 DIAGNOSIS — E559 Vitamin D deficiency, unspecified: Secondary | ICD-10-CM | POA: Diagnosis not present

## 2019-01-08 DIAGNOSIS — Z6831 Body mass index (BMI) 31.0-31.9, adult: Secondary | ICD-10-CM

## 2019-01-08 DIAGNOSIS — E119 Type 2 diabetes mellitus without complications: Secondary | ICD-10-CM

## 2019-01-08 DIAGNOSIS — E1169 Type 2 diabetes mellitus with other specified complication: Secondary | ICD-10-CM | POA: Insufficient documentation

## 2019-01-08 DIAGNOSIS — K219 Gastro-esophageal reflux disease without esophagitis: Secondary | ICD-10-CM | POA: Diagnosis not present

## 2019-01-08 DIAGNOSIS — E1165 Type 2 diabetes mellitus with hyperglycemia: Secondary | ICD-10-CM | POA: Insufficient documentation

## 2019-01-08 DIAGNOSIS — E1159 Type 2 diabetes mellitus with other circulatory complications: Secondary | ICD-10-CM | POA: Insufficient documentation

## 2019-01-08 DIAGNOSIS — M8580 Other specified disorders of bone density and structure, unspecified site: Secondary | ICD-10-CM | POA: Diagnosis not present

## 2019-01-08 DIAGNOSIS — E6609 Other obesity due to excess calories: Secondary | ICD-10-CM | POA: Diagnosis not present

## 2019-01-08 DIAGNOSIS — I10 Essential (primary) hypertension: Secondary | ICD-10-CM

## 2019-01-08 HISTORY — DX: Type 2 diabetes mellitus without complications: E11.9

## 2019-01-08 HISTORY — DX: Essential (primary) hypertension: I10

## 2019-01-08 MED ORDER — VENLAFAXINE HCL ER 75 MG PO CP24: 75.0000 mg | ORAL_CAPSULE | Freq: Two times a day (BID) | ORAL | 3 refills | Status: DC

## 2019-01-08 NOTE — Progress Notes (Signed)
Metrics: Intervention Frequency ACO  Documented Smoking Status Yearly  Screened one or more times in 24 months  Cessation Counseling or  Active cessation medication Past 24 months  Past 24 months   Guideline developer: UpToDate (See UpToDate for funding source) Date Released: 2014       Wellness Office Visit  Subjective:  Patient ID: Amy Houston, female    DOB: 1945-06-15  Age: 74 y.o. MRN: [id number]  CC: This lovely 74 year old lady comes to our practice as a new patient to be established.  Her physician, Dr. [name], has now retired. HPI  She has a history of breast cancer and she is a breast cancer survivor.  She is on Aromasin and she has been told to be on this for life.  Unfortunately, she is getting hot flashes and i Effexor 50 mg twice a day does not seem to control it. She is also diabetic but diet controlled. She is also hypertensive on medications.  She denies any chest pain, dyspnea or palpitations. Past Medical History:  Diagnosis Date  . Abnormal genetic test 04/09/2013   PALB2 and CHEK2 positivity   . Arthritis    "of the spine" (05/08/2012)  . Breast cancer ([place]) 1995   "left" (05/08/2012)  . Breast cancer, right breast, IDC, Multifocal 03/12/2012   Multifocal, 10 and 8 left breast   . Diabetes mellitus 1999   "dx; never on meds" (05/08/2012)  . Diabetes mellitus without complication ([place]) 01/03/9415  . Essential hypertension, benign 01/08/2019  . Exertional shortness of breath   . GERD (gastroesophageal reflux disease)    tums  . H/O hiatal hernia   . Hemorrhoid   . History of blood transfusion    post child birth  . HTN (hypertension)   . IBS (irritable bowel syndrome)   . Osteopenia 12/11/2012   On Ca++ and Vit D. Bone density in April 2014.  Next bone density due in April 2016.  Marland Kitchen PONV (postoperative nausea and vomiting)   . PPD positive, treated 1999      Family History  Problem Relation Age of Onset  . Lung cancer Mother 43       smoker  .  Leukemia Father 57       Hairy Cell at 41, CLL at 2  . Lymphoma Father 12       Hodgkins lymphoma  . Lung cancer Sister 19  . Thyroid cancer Brother 46       Medullary  . Lymphoma Brother 60       Follicular lymphoma  . Breast cancer Maternal Aunt        mother's paternal half sister  . Lung cancer Maternal Uncle        mother's paternal half brother  . Lung cancer Maternal Grandmother        smoker  . Other Daughter        leiomyoma of esophagus  . Colon cancer Maternal Aunt        mother's maternal half sister  . Cancer Paternal Uncle        oral cancer dx in his 64s  . Lung cancer Cousin        paternal cousin  . Breast cancer Cousin        paternal cousin died in her 85s    Social History   Social History Narrative   Married 20 yrs.Lives with wife .Retired.   Social History   Tobacco Use  . Smoking status: Never Smoker  .  Smokeless tobacco: Never Used  Substance Use Topics  . Alcohol use: Yes    Comment: 05/08/2012 "margarita or glass of wine once or twice/yr"    Current Meds  Medication Sig  . acetaminophen (TYLENOL) 500 MG tablet Take 1,000 mg by mouth daily as needed for moderate pain or headache.  . calcium-vitamin D (OSCAL WITH D) 500-200 MG-UNIT tablet Take 1 tablet by mouth daily.   . Cholecalciferol (VITAMIN D) 2000 units CAPS Take 2,000 Units by mouth at bedtime.  Marland Kitchen exemestane (AROMASIN) 25 MG tablet TAKE (1) TABLET ONCE DAILY IN THE MORNING, AFTER BREAKFAST.  Marland Kitchen lisinopril (PRINIVIL,ZESTRIL) 20 MG tablet Take 20 mg by mouth daily.  . metoprolol succinate (TOPROL-XL) 25 MG 24 hr tablet Take 25 mg by mouth daily.  . Multiple Vitamin (MULTIVITAMIN WITH MINERALS) TABS Take 1 tablet by mouth at bedtime.   Marland Kitchen omeprazole (PRILOSEC) 20 MG capsule TAKE ONE CAPSULE BY MOUTH ONCE DAILY.  Marland Kitchen ondansetron (ZOFRAN ODT) 4 MG disintegrating tablet Take 1 tablet (4 mg total) by mouth every 8 (eight) hours as needed for nausea or vomiting.  . [DISCONTINUED]  HYDROcodone-acetaminophen (NORCO/VICODIN) 5-325 MG tablet Take 1 tablet by mouth every 4 (four) hours as needed.  . [DISCONTINUED] venlafaxine (EFFEXOR) 50 MG tablet Take 50 mg by mouth 2 (two) times daily.      Objective:   Today's Vitals: BP 124/60   Pulse 75   Temp (!) 97.5 F (36.4 C) (Temporal)   Resp 15   Ht 4' 11.5" (1.511 m)   Wt 163 lb 3.2 oz (74 kg)   SpO2 97%   BMI 32.41 kg/m  Vitals with BMI 01/08/2019 12/30/2018 07/10/2018  Height 4' 11.5" - -  Weight 163 lbs 3 oz - -  BMI 70.78 - -  Systolic 675 449 201  Diastolic 60 83 63  Pulse 75 83 93     Physical Exam  She looks systemically well.  She is obese.  Blood pressure is well controlled.  She is alert and oriented orientated.  No obvious focal neurological signs     Assessment   1. Gastroesophageal reflux disease without esophagitis   2. Essential hypertension, benign   3. Osteopenia, unspecified location   4. Diabetes mellitus without complication (HCC)   5. Class 1 obesity due to excess calories with serious comorbidity and body mass index (BMI) of 31.0 to 31.9 in adult   6. Vitamin D deficiency disease       Tests ordered Orders Placed This Encounter  Procedures  . CMP with eGFR([facility])  . Hemoglobin A1c  . Lipid Panel  . T3, Free  . T4  . TSH  . Vitamin D, 25-hydroxy     Plan: 1. Blood work is ordered above. 2. She will continue with all medications for hypertension. 3. I am going to increase her Effexor to 75 mg twice a day. 4. We discussed diet today and the concept of intermittent fasting combined with a whole food plant-based diet involving low carbohydrates, moderate protein and higher fat. 5. Further recommendations will depend on blood results and I will see her in about 3 months time for follow-up. 6. Today I spent 45 minutes with this patient.   Meds ordered this encounter  Medications  . venlafaxine XR (EFFEXOR XR) 75 MG 24 hr capsule    Sig: Take 1 capsule (75 mg total) by  mouth 2 (two) times daily. Dose changed,please ignore old dose    Dispense:  60 capsule  Refill:  3    Allix Blomquist Luther Parody, MD

## 2019-01-08 NOTE — Patient Instructions (Signed)
[facility] Dietary Recommendations for Weight Loss What to Avoid . Avoid added sugars o Often added sugar can be found in processed foods such as many condiments, dry cereals, cakes, cookies, chips, crisps, crackers, candies, sweetened drinks, etc.  o Read labels and AVOID/DECREASE use of foods with the following in their ingredient list: Sugar, fructose, high fructose corn syrup, sucrose, glucose, maltose, dextrose, molasses, cane sugar, brown sugar, any type of syrup, agave nectar, etc.   . Avoid snacking in between meals . Avoid foods made with flour o If you are going to eat food made with flour, choose those made with whole-grains; and, minimize your consumption as much as is tolerable . Avoid processed foods o These foods are generally stocked in the middle of the grocery store. Focus on shopping on the perimeter of the grocery.  . Avoid Meat  o We recommend following a plant-based diet at [facility]. Thus, we recommend avoiding meat as a general rule. Consider eating beans, legumes, eggs, and/or dairy products for regular protein sources o If you plan on eating meat limit to 4 ounces of meat at a time and choose lean options such as Fish, chicken, turkey. Avoid red meat intake such as pork and/or steak What to Include . Vegetables o GREEN LEAFY VEGETABLES: Kale, spinach, mustard greens, collard greens, cabbage, broccoli, etc. o OTHER: Asparagus, cauliflower, eggplant, carrots, peas, Brussel sprouts, tomatoes, bell peppers, zucchini, beets, cucumbers, etc. . Grains, seeds, and legumes o Beans: kidney beans, black eyed peas, garbanzo beans, black beans, pinto beans, etc. o Whole, unrefined grains: brown rice, barley, bulgur, oatmeal, etc. . Healthy fats  o Avoid highly processed fats such as vegetable oil o Examples of healthy fats: avocado, olives, virgin olive oil, dark chocolate (?72% Cocoa), nuts (peanuts, almonds, walnuts, cashews, pecans, etc.) . None to Low  Intake of Animal Sources of Protein o Meat sources: chicken, turkey, salmon, tuna. Limit to 4 ounces of meat at one time. o Consider limiting dairy sources, but when choosing dairy focus on: PLAIN Greek yogurt, cottage cheese, high-protein milk . Fruit o Choose berries  When to Eat . Intermittent Fasting: o Choosing not to eat for a specific time period, but DO FOCUS ON HYDRATION when fasting o Multiple Techniques: - Time Restricted Eating: eat 3 meals in a day, each meal lasting no more than 60 minutes, no snacks between meals - 16-18 hour fast: fast for 16 to 18 hours up to 7 days a week. Often suggested to start with 2-3 nonconsecutive days per week.  . Remember the time you sleep is counted as fasting.  . Examples of eating schedule: Fast from 7:00pm-11:00am. Eat between 11:00am-7:00pm.  - 24-hour fast: fast for 24 hours up to every other day. Often suggested to start with 1 day per week . Remember the time you sleep is counted as fasting . Examples of eating schedule:  o Eating day: eat 2-3 meals on your eating day. If doing 2 meals, each meal should last no more than 90 minutes. If doing 3 meals, each meal should last no more than 60 minutes. Finish last meal by 7:00pm. o Fasting day: Fast until 7:00pm.  o IF YOU FEEL UNWELL FOR ANY REASON/IN ANY WAY WHEN FASTING, STOP FASTING BY EATING A NUTRITIOUS SNACK OR LIGHT MEAL o ALWAYS FOCUS ON HYDRATION DURING FASTS - Acceptable Hydration sources: water, broths, tea/coffee (black tea/coffee is best but using a small amount of whole-fat dairy products in coffee/tea is acceptable).  -   Poor Hydration Sources: anything with sugar or artificial sweeteners added to it  These recommendations have been developed for patients that are actively receiving medical care from either Dr. Shawnya Mayor or [name], DNP, NP-C at [facility]. These recommendations are developed for patients with specific medical conditions and are not meant to be  distributed or used by others that are not actively receiving care from either provider listed above at [facility]. It is not appropriate to participate in the above eating plans without proper medical supervision.   Reference: Fung, J. The obesity code. [place]: [place]; 2016.   

## 2019-01-09 ENCOUNTER — Ambulatory Visit (HOSPITAL_COMMUNITY): Payer: PPO

## 2019-01-09 ENCOUNTER — Other Ambulatory Visit (HOSPITAL_COMMUNITY): Payer: PPO

## 2019-01-09 ENCOUNTER — Ambulatory Visit (HOSPITAL_COMMUNITY): Payer: PPO | Admitting: Hematology

## 2019-01-09 LAB — COMPLETE METABOLIC PANEL WITH GFR
AG Ratio: 1.7 (calc) (ref 1.0–2.5)
ALT: 29 U/L (ref 6–29)
AST: 23 U/L (ref 10–35)
Albumin: 4.8 g/dL (ref 3.6–5.1)
Alkaline phosphatase (APISO): 37 U/L (ref 37–153)
BUN/Creatinine Ratio: 21 (calc) (ref 6–22)
BUN: 22 mg/dL (ref 7–25)
CO2: 22 mmol/L (ref 20–32)
Calcium: 10.5 mg/dL — ABNORMAL HIGH (ref 8.6–10.4)
Chloride: 104 mmol/L (ref 98–110)
Creat: 1.03 mg/dL — ABNORMAL HIGH (ref 0.60–0.93)
GFR, Est African American: 63 mL/min/{1.73_m2} (ref >=60)
GFR, Est Non African American: 54 mL/min/{1.73_m2} — ABNORMAL LOW (ref >=60)
Globulin: 2.9 g/dL (calc) (ref 1.9–3.7)
Glucose, Bld: 143 mg/dL — ABNORMAL HIGH (ref 65–99)
Potassium: 4.6 mmol/L (ref 3.5–5.3)
Sodium: 139 mmol/L (ref 135–146)
Total Bilirubin: 0.5 mg/dL (ref 0.2–1.2)
Total Protein: 7.7 g/dL (ref 6.1–8.1)

## 2019-01-09 LAB — HEMOGLOBIN A1C
Hgb A1c MFr Bld: 7.1 % of total Hgb — ABNORMAL HIGH (ref <5.7)
Mean Plasma Glucose: 157 (calc)
eAG (mmol/L): 8.7 (calc)

## 2019-01-09 LAB — LIPID PANEL
Cholesterol: 196 mg/dL (ref <200)
HDL: 46 mg/dL — ABNORMAL LOW (ref >=50)
LDL Cholesterol (Calc): 126 mg/dL (calc) — ABNORMAL HIGH
Non-HDL Cholesterol (Calc): 150 mg/dL (calc) — ABNORMAL HIGH (ref <130)
Total CHOL/HDL Ratio: 4.3 (calc) (ref <5.0)
Triglycerides: 125 mg/dL (ref <150)

## 2019-01-09 LAB — TSH: TSH: 1.21 mIU/L (ref 0.40–4.50)

## 2019-01-09 LAB — T4: T4, Total: 6.8 ug/dL (ref 5.1–11.9)

## 2019-01-09 LAB — T3, FREE: T3, Free: 3.3 pg/mL (ref 2.3–4.2)

## 2019-01-09 LAB — VITAMIN D 25 HYDROXY (VIT D DEFICIENCY, FRACTURES): Vit D, 25-Hydroxy: 48 ng/mL (ref 30–100)

## 2019-01-22 ENCOUNTER — Ambulatory Visit: Payer: Self-pay | Admitting: *Deleted

## 2019-01-22 NOTE — Telephone Encounter (Signed)
Pt had pneumonia vaccine 01/08/2019. Questioning if she could receive covid vaccine she is scheduled for tomorrow. Per guidelines, wait 14 days after any vaccine. Pt verbalizes understanding that it is OK for her to get vaccine, she is 15 days out.  Reason for Disposition . General information question, no triage required and triager able to answer question  Answer Assessment - Initial Assessment Questions 1. REASON FOR CALL or QUESTION: "What is your reason for calling today?" or "How can I best help you?" or "What question do you have that I can help answer?"     Questions regarding vaccine  Protocols used: INFORMATION ONLY CALL-A-AH

## 2019-01-23 ENCOUNTER — Ambulatory Visit: Payer: PPO | Attending: Internal Medicine

## 2019-01-23 DIAGNOSIS — Z23 Encounter for immunization: Secondary | ICD-10-CM | POA: Insufficient documentation

## 2019-01-23 NOTE — Progress Notes (Signed)
   Covid-19 Vaccination Clinic  Name:  Amy Houston    MRN: [id number] DOB: 18-Jun-1945  01/23/2019  [name] was observed post Covid-19 immunization for 15 minutes without incidence. She was provided with Vaccine Information Sheet and instruction to access the V-Safe system.   [name] was instructed to call 911 with any severe reactions post vaccine: Marland Kitchen Difficulty breathing  . Swelling of your face and throat  . A fast heartbeat  . A bad rash all over your body  . Dizziness and weakness    Immunizations Administered    Name Date Dose VIS Date Route   Pfizer COVID-19 Vaccine 01/23/2019  4:24 PM 0.3 mL 12/13/2018 Intramuscular   Manufacturer: [place]   Lot: [id number]   [place]: [address]-4

## 2019-02-04 ENCOUNTER — Encounter (HOSPITAL_COMMUNITY): Payer: Self-pay

## 2019-02-11 ENCOUNTER — Other Ambulatory Visit (HOSPITAL_COMMUNITY): Payer: Self-pay | Admitting: Hematology

## 2019-02-11 MED ORDER — LETROZOLE 2.5 MG PO TABS: 2.5000 mg | ORAL_TABLET | Freq: Every day | ORAL | 6 refills | Status: DC

## 2019-02-14 ENCOUNTER — Ambulatory Visit: Payer: PPO | Attending: Internal Medicine

## 2019-02-14 DIAGNOSIS — Z23 Encounter for immunization: Secondary | ICD-10-CM | POA: Insufficient documentation

## 2019-02-14 NOTE — Progress Notes (Signed)
   Covid-19 Vaccination Clinic  Name:  Amy Houston    MRN: [id number] DOB: 06-15-1945  02/14/2019  Amy Houston was observed post Covid-19 immunization for 15 minutes without incidence. She was provided with Vaccine Information Sheet and instruction to access the V-Safe system.   Amy Houston was instructed to call 911 with any severe reactions post vaccine: Marland Kitchen Difficulty breathing  . Swelling of your face and throat  . A fast heartbeat  . A bad rash all over your body  . Dizziness and weakness    Immunizations Administered    Name Date Dose VIS Date Route   Pfizer COVID-19 Vaccine 02/14/2019  4:31 PM 0.3 mL 12/13/2018 Intramuscular   Manufacturer: [place]   Lot: [id number]   [place]: [address]-3

## 2019-04-10 ENCOUNTER — Ambulatory Visit (INDEPENDENT_AMBULATORY_CARE_PROVIDER_SITE_OTHER): Payer: PPO | Admitting: Internal Medicine

## 2019-04-23 ENCOUNTER — Other Ambulatory Visit: Payer: Self-pay

## 2019-04-23 ENCOUNTER — Ambulatory Visit (INDEPENDENT_AMBULATORY_CARE_PROVIDER_SITE_OTHER): Payer: PPO | Admitting: Internal Medicine

## 2019-04-23 ENCOUNTER — Encounter (INDEPENDENT_AMBULATORY_CARE_PROVIDER_SITE_OTHER): Payer: Self-pay | Admitting: Internal Medicine

## 2019-04-23 VITALS — BP 140/75 | HR 90 | Temp 97.3°F | Ht 58.5 in | Wt 158.6 lb

## 2019-04-23 DIAGNOSIS — E119 Type 2 diabetes mellitus without complications: Secondary | ICD-10-CM | POA: Diagnosis not present

## 2019-04-23 DIAGNOSIS — I10 Essential (primary) hypertension: Secondary | ICD-10-CM | POA: Diagnosis not present

## 2019-04-23 DIAGNOSIS — E559 Vitamin D deficiency, unspecified: Secondary | ICD-10-CM

## 2019-04-23 DIAGNOSIS — E6609 Other obesity due to excess calories: Secondary | ICD-10-CM | POA: Diagnosis not present

## 2019-04-23 DIAGNOSIS — Z6831 Body mass index (BMI) 31.0-31.9, adult: Secondary | ICD-10-CM

## 2019-04-23 NOTE — Progress Notes (Signed)
Metrics: Intervention Frequency ACO  Documented Smoking Status Yearly  Screened one or more times in 24 months  Cessation Counseling or  Active cessation medication Past 24 months  Past 24 months   Guideline developer: UpToDate (See UpToDate for funding source) Date Released: 2014       Wellness Office Visit  Subjective:  Patient ID: Amy Houston, female    DOB: 12-Sep-1945  Age: 74 y.o. MRN: [id number]  CC: This lady comes in for follow-up of diabetes, obesity, vitamin D deficiency, gastroesophageal reflux disease. HPI  She has done quite well with nutrition.  She does try to do intermittent fasting when she can and tries to eat healthier.  She has lost weight as a result. We increased the dose of Effexor for hot flashes which seems to have helped her. She also did increase the vitamin D3 to 4000 units daily. She is concerned about memory loss and has been taking over-the-counter supplements to see if this will help her. Past Medical History:  Diagnosis Date  . Abnormal genetic test 04/09/2013   PALB2 and CHEK2 positivity   . Arthritis    "of the spine" (05/08/2012)  . Breast cancer ([facility]) 1995   "left" (05/08/2012)  . Breast cancer, right breast, IDC, Multifocal 03/12/2012   Multifocal, 10 and 8 left breast   . Diabetes mellitus 1999   "dx; never on meds" (05/08/2012)  . Diabetes mellitus without complication ([place]) 08/09/4164  . Essential hypertension, benign 01/08/2019  . Exertional shortness of breath   . GERD (gastroesophageal reflux disease)    tums  . H/O hiatal hernia   . Hemorrhoid   . History of blood transfusion    post child birth  . HTN (hypertension)   . IBS (irritable bowel syndrome)   . Osteopenia 12/11/2012   On Ca++ and Vit D. Bone density in April 2014.  Next bone density due in April 2016.  Marland Kitchen PONV (postoperative nausea and vomiting)   . PPD positive, treated 1999      Family History  Problem Relation Age of Onset  . Lung cancer Mother 27       smoker    . Leukemia Father 40       Hairy Cell at 17, CLL at 79  . Lymphoma Father 51       Hodgkins lymphoma  . Lung cancer Sister 25  . Thyroid cancer Brother 46       Medullary  . Lymphoma Brother 60       Follicular lymphoma  . Breast cancer Maternal Aunt        mother's paternal half sister  . Lung cancer Maternal Uncle        mother's paternal half brother  . Lung cancer Maternal Grandmother        smoker  . Other Daughter        leiomyoma of esophagus  . Colon cancer Maternal Aunt        mother's maternal half sister  . Cancer Paternal Uncle        oral cancer dx in his 33s  . Lung cancer Cousin        paternal cousin  . Breast cancer Cousin        paternal cousin died in her 42s    Social History   Social History Narrative   Married 51 yrs.Lives with wife .Retired.   Social History   Tobacco Use  . Smoking status: Never Smoker  . Smokeless tobacco: Never Used  Substance Use Topics  . Alcohol use: Yes    Comment: 05/08/2012 "margarita or glass of wine once or twice/yr"    Current Meds  Medication Sig  . acetaminophen (TYLENOL) 500 MG tablet Take 1,000 mg by mouth daily as needed for moderate pain or headache.  . calcium-vitamin D (OSCAL WITH D) 500-200 MG-UNIT tablet Take 1 tablet by mouth daily.   . Cholecalciferol (VITAMIN D) 2000 units CAPS Take 2,000 Units by mouth at bedtime.  . cyanocobalamin (KP VITAMIN B-12) 1000 MCG tablet Take 1,000 mcg by mouth in the morning and at bedtime.  Marland Kitchen letrozole (FEMARA) 2.5 MG tablet Take 1 tablet (2.5 mg total) by mouth daily.  Marland Kitchen lisinopril (PRINIVIL,ZESTRIL) 20 MG tablet Take 20 mg by mouth daily.  . metoprolol succinate (TOPROL-XL) 25 MG 24 hr tablet Take 25 mg by mouth daily.  . Multiple Vitamin (MULTIVITAMIN WITH MINERALS) TABS Take 1 tablet by mouth at bedtime.   Marland Kitchen omeprazole (PRILOSEC) 20 MG capsule TAKE ONE CAPSULE BY MOUTH ONCE DAILY.  Marland Kitchen ondansetron (ZOFRAN ODT) 4 MG disintegrating tablet Take 1 tablet (4 mg total) by  mouth every 8 (eight) hours as needed for nausea or vomiting.  Marland Kitchen OVER THE COUNTER MEDICATION Take 300 mg by mouth in the morning and at bedtime. [place] Support  . venlafaxine XR (EFFEXOR XR) 75 MG 24 hr capsule Take 1 capsule (75 mg total) by mouth 2 (two) times daily. Dose changed,please ignore old dose      Objective:   Today's Vitals: BP 140/75 (BP Location: Right Arm, Patient Position: Sitting, Cuff Size: Normal)   Pulse 90   Temp (!) 97.3 F (36.3 C) (Temporal)   Ht 4' 10.5" (1.486 m)   Wt 158 lb 9.6 oz (71.9 kg)   SpO2 98%   BMI 32.58 kg/m  Vitals with BMI 04/23/2019 01/08/2019 12/30/2018  Height 4' 10.5" 4' 11.5" -  Weight 158 lbs 10 oz 163 lbs 3 oz -  BMI 17.00 17.49 -  Systolic 449 675 916  Diastolic 75 60 83  Pulse 90 75 83     Physical Exam    She look systemically well.  Although she remains obese, she has lost 5 pounds since her last visit.  Blood pressure is reasonable.   Assessment   1. Diabetes mellitus without complication (HCC)   2. Class 1 obesity due to excess calories with serious comorbidity and body mass index (BMI) of 31.0 to 31.9 in adult   3. Vitamin D deficiency disease   4. Essential hypertension, benign       Tests ordered Orders Placed This Encounter  Procedures  . COMPLETE METABOLIC PANEL WITH GFR  . VITAMIN D 25 Hydroxy (Vit-D Deficiency, Fractures)  . Hemoglobin A1c     Plan: 1. Blood work is ordered above. 2. She will continue with diet controlled diabetic plan.  We will check an A1c today. 3. She will continue with antihypertensive therapy which seems to be controlling her blood pressure reasonably well. 4. She will continue with vitamin D3 supplementation for vitamin D deficiency and we will check levels today. 5. Further recommendations will depend on blood results and I will have her follow-up with Sarah in about 3 months time.   No orders of the defined types were placed in this encounter.   Doree Albee, MD

## 2019-04-24 LAB — COMPLETE METABOLIC PANEL WITH GFR
AG Ratio: 1.6 (calc) (ref 1.0–2.5)
ALT: 34 U/L — ABNORMAL HIGH (ref 6–29)
AST: 27 U/L (ref 10–35)
Albumin: 4.5 g/dL (ref 3.6–5.1)
Alkaline phosphatase (APISO): 95 U/L (ref 37–153)
BUN: 15 mg/dL (ref 7–25)
CO2: 23 mmol/L (ref 20–32)
Calcium: 10.9 mg/dL — ABNORMAL HIGH (ref 8.6–10.4)
Chloride: 102 mmol/L (ref 98–110)
Creat: 0.88 mg/dL (ref 0.60–0.93)
GFR, Est African American: 76 mL/min/{1.73_m2} (ref >=60)
GFR, Est Non African American: 66 mL/min/{1.73_m2} (ref >=60)
Globulin: 2.9 g/dL (calc) (ref 1.9–3.7)
Glucose, Bld: 139 mg/dL — ABNORMAL HIGH (ref 65–99)
Potassium: 4.3 mmol/L (ref 3.5–5.3)
Sodium: 138 mmol/L (ref 135–146)
Total Bilirubin: 0.7 mg/dL (ref 0.2–1.2)
Total Protein: 7.4 g/dL (ref 6.1–8.1)

## 2019-04-24 LAB — HEMOGLOBIN A1C
Hgb A1c MFr Bld: 6.8 % of total Hgb — ABNORMAL HIGH (ref <5.7)
Mean Plasma Glucose: 148 (calc)
eAG (mmol/L): 8.2 (calc)

## 2019-04-24 LAB — VITAMIN D 25 HYDROXY (VIT D DEFICIENCY, FRACTURES): Vit D, 25-Hydroxy: 48 ng/mL (ref 30–100)

## 2019-04-28 ENCOUNTER — Encounter (INDEPENDENT_AMBULATORY_CARE_PROVIDER_SITE_OTHER): Payer: Self-pay | Admitting: Internal Medicine

## 2019-04-28 ENCOUNTER — Encounter (INDEPENDENT_AMBULATORY_CARE_PROVIDER_SITE_OTHER): Payer: Self-pay

## 2019-04-30 ENCOUNTER — Other Ambulatory Visit (INDEPENDENT_AMBULATORY_CARE_PROVIDER_SITE_OTHER): Payer: Self-pay | Admitting: Internal Medicine

## 2019-05-13 DIAGNOSIS — L820 Inflamed seborrheic keratosis: Secondary | ICD-10-CM | POA: Diagnosis not present

## 2019-05-13 DIAGNOSIS — D2262 Melanocytic nevi of left upper limb, including shoulder: Secondary | ICD-10-CM | POA: Diagnosis not present

## 2019-05-13 DIAGNOSIS — D1801 Hemangioma of skin and subcutaneous tissue: Secondary | ICD-10-CM | POA: Diagnosis not present

## 2019-05-13 DIAGNOSIS — D2261 Melanocytic nevi of right upper limb, including shoulder: Secondary | ICD-10-CM | POA: Diagnosis not present

## 2019-05-13 DIAGNOSIS — L821 Other seborrheic keratosis: Secondary | ICD-10-CM | POA: Diagnosis not present

## 2019-06-16 ENCOUNTER — Other Ambulatory Visit (INDEPENDENT_AMBULATORY_CARE_PROVIDER_SITE_OTHER): Payer: Self-pay | Admitting: Internal Medicine

## 2019-06-18 ENCOUNTER — Other Ambulatory Visit (INDEPENDENT_AMBULATORY_CARE_PROVIDER_SITE_OTHER): Payer: Self-pay

## 2019-06-18 MED ORDER — LISINOPRIL 20 MG PO TABS: 20.0000 mg | ORAL_TABLET | Freq: Every day | ORAL | 0 refills | Status: DC

## 2019-07-01 ENCOUNTER — Other Ambulatory Visit (INDEPENDENT_AMBULATORY_CARE_PROVIDER_SITE_OTHER): Payer: Self-pay | Admitting: Internal Medicine

## 2019-07-01 DIAGNOSIS — E2839 Other primary ovarian failure: Secondary | ICD-10-CM

## 2019-07-01 DIAGNOSIS — M8580 Other specified disorders of bone density and structure, unspecified site: Secondary | ICD-10-CM | POA: Diagnosis not present

## 2019-07-04 ENCOUNTER — Other Ambulatory Visit: Payer: Self-pay | Admitting: Gastroenterology

## 2019-07-04 LAB — TESTOS,TOTAL,FREE AND SHBG (FEMALE)
Free Testosterone: 1.4 pg/mL (ref 0.2–3.7)
Sex Hormone Binding: 20 nmol/L (ref 14–73)
Testosterone, Total, LC-MS-MS: 8 ng/dL (ref 2–45)

## 2019-07-04 LAB — PROGESTERONE: Progesterone: <0.5 ng/mL

## 2019-07-04 LAB — ESTRADIOL: Estradiol: <15 pg/mL

## 2019-07-08 NOTE — Telephone Encounter (Signed)
Patient last seen in 08/2017. Needs ov to continue refills. Limited supply of omeprazole provided.

## 2019-07-09 ENCOUNTER — Encounter: Payer: Self-pay | Admitting: Internal Medicine

## 2019-07-09 ENCOUNTER — Ambulatory Visit (HOSPITAL_COMMUNITY)
Admission: RE | Admit: 2019-07-09 | Discharge: 2019-07-09 | Disposition: A | Discharge status: Home / Self Care | Payer: PPO | Source: Ambulatory Visit | Attending: Hematology | Admitting: Hematology

## 2019-07-09 ENCOUNTER — Other Ambulatory Visit: Payer: Self-pay

## 2019-07-09 DIAGNOSIS — R918 Other nonspecific abnormal finding of lung field: Secondary | ICD-10-CM | POA: Diagnosis not present

## 2019-07-09 DIAGNOSIS — I700 Atherosclerosis of aorta: Secondary | ICD-10-CM | POA: Diagnosis not present

## 2019-07-09 DIAGNOSIS — I2510 Atherosclerotic heart disease of native coronary artery without angina pectoris: Secondary | ICD-10-CM | POA: Diagnosis not present

## 2019-07-09 DIAGNOSIS — M8580 Other specified disorders of bone density and structure, unspecified site: Secondary | ICD-10-CM | POA: Diagnosis not present

## 2019-07-09 DIAGNOSIS — C50919 Malignant neoplasm of unspecified site of unspecified female breast: Secondary | ICD-10-CM | POA: Diagnosis not present

## 2019-07-09 NOTE — Telephone Encounter (Signed)
Sent patient letter stating she needed to schedule an appointment

## 2019-07-11 ENCOUNTER — Other Ambulatory Visit (HOSPITAL_COMMUNITY): Payer: Self-pay | Admitting: *Deleted

## 2019-07-11 DIAGNOSIS — M8580 Other specified disorders of bone density and structure, unspecified site: Secondary | ICD-10-CM

## 2019-07-11 DIAGNOSIS — Z170 Estrogen receptor positive status [ER+]: Secondary | ICD-10-CM

## 2019-07-14 ENCOUNTER — Other Ambulatory Visit: Payer: Self-pay

## 2019-07-14 ENCOUNTER — Inpatient Hospital Stay (HOSPITAL_BASED_OUTPATIENT_CLINIC_OR_DEPARTMENT_OTHER): Payer: PPO | Admitting: Hematology

## 2019-07-14 ENCOUNTER — Inpatient Hospital Stay (HOSPITAL_COMMUNITY): Payer: PPO

## 2019-07-14 ENCOUNTER — Inpatient Hospital Stay (HOSPITAL_COMMUNITY): Payer: PPO | Attending: Hematology

## 2019-07-14 VITALS — BP 157/52 | HR 80 | Temp 97.1°F | Resp 18 | Wt 159.4 lb

## 2019-07-14 DIAGNOSIS — Z170 Estrogen receptor positive status [ER+]: Secondary | ICD-10-CM | POA: Insufficient documentation

## 2019-07-14 DIAGNOSIS — Z79899 Other long term (current) drug therapy: Secondary | ICD-10-CM | POA: Diagnosis not present

## 2019-07-14 DIAGNOSIS — Z923 Personal history of irradiation: Secondary | ICD-10-CM | POA: Diagnosis not present

## 2019-07-14 DIAGNOSIS — C50111 Malignant neoplasm of central portion of right female breast: Secondary | ICD-10-CM

## 2019-07-14 DIAGNOSIS — Z853 Personal history of malignant neoplasm of breast: Secondary | ICD-10-CM | POA: Insufficient documentation

## 2019-07-14 DIAGNOSIS — Z9221 Personal history of antineoplastic chemotherapy: Secondary | ICD-10-CM | POA: Insufficient documentation

## 2019-07-14 DIAGNOSIS — R911 Solitary pulmonary nodule: Secondary | ICD-10-CM | POA: Insufficient documentation

## 2019-07-14 DIAGNOSIS — Z9013 Acquired absence of bilateral breasts and nipples: Secondary | ICD-10-CM | POA: Diagnosis not present

## 2019-07-14 DIAGNOSIS — M8580 Other specified disorders of bone density and structure, unspecified site: Secondary | ICD-10-CM

## 2019-07-14 DIAGNOSIS — Z79811 Long term (current) use of aromatase inhibitors: Secondary | ICD-10-CM | POA: Insufficient documentation

## 2019-07-14 LAB — COMPREHENSIVE METABOLIC PANEL
ALT: 36 U/L (ref 0–44)
AST: 31 U/L (ref 15–41)
Albumin: 4.1 g/dL (ref 3.5–5.0)
Alkaline Phosphatase: 103 U/L (ref 38–126)
Anion gap: 11 (ref 5–15)
BUN: 17 mg/dL (ref 8–23)
CO2: 23 mmol/L (ref 22–32)
Calcium: 9.7 mg/dL (ref 8.9–10.3)
Chloride: 104 mmol/L (ref 98–111)
Creatinine, Ser: 0.86 mg/dL (ref 0.44–1.00)
GFR calc Af Amer: >60 mL/min (ref >60)
GFR calc non Af Amer: >60 mL/min (ref >60)
Glucose, Bld: 208 mg/dL — ABNORMAL HIGH (ref 70–99)
Potassium: 3.9 mmol/L (ref 3.5–5.1)
Sodium: 138 mmol/L (ref 135–145)
Total Bilirubin: 0.7 mg/dL (ref 0.3–1.2)
Total Protein: 7.3 g/dL (ref 6.5–8.1)

## 2019-07-14 LAB — CBC WITH DIFFERENTIAL/PLATELET
Abs Immature Granulocytes: 0.04 10*3/uL (ref 0.00–0.07)
Basophils Absolute: 0 10*3/uL (ref 0.0–0.1)
Basophils Relative: 0 %
Eosinophils Absolute: 0 10*3/uL (ref 0.0–0.5)
Eosinophils Relative: 0 %
HCT: 39.9 % (ref 36.0–46.0)
Hemoglobin: 12.8 g/dL (ref 12.0–15.0)
Immature Granulocytes: 1 %
Lymphocytes Relative: 28 %
Lymphs Abs: 1.9 10*3/uL (ref 0.7–4.0)
MCH: 31 pg (ref 26.0–34.0)
MCHC: 32.1 g/dL (ref 30.0–36.0)
MCV: 96.6 fL (ref 80.0–100.0)
Monocytes Absolute: 0.4 10*3/uL (ref 0.1–1.0)
Monocytes Relative: 6 %
Neutro Abs: 4.4 10*3/uL (ref 1.7–7.7)
Neutrophils Relative %: 65 %
Platelets: 306 10*3/uL (ref 150–400)
RBC: 4.13 MIL/uL (ref 3.87–5.11)
RDW: 12 % (ref 11.5–15.5)
WBC: 6.8 10*3/uL (ref 4.0–10.5)
nRBC: 0 % (ref 0.0–0.2)

## 2019-07-14 MED ORDER — DENOSUMAB 60 MG/ML ~~LOC~~ SOLN: 60.0000 mg | Freq: Once | SUBCUTANEOUS | Status: DC

## 2019-07-14 MED ORDER — DENOSUMAB 60 MG/ML ~~LOC~~ SOSY
PREFILLED_SYRINGE | SUBCUTANEOUS | Status: AC
  Filled 2019-07-14: qty 1

## 2019-07-14 MED ORDER — DENOSUMAB 60 MG/ML ~~LOC~~ SOSY
60.0000 mg | PREFILLED_SYRINGE | Freq: Once | SUBCUTANEOUS | Status: AC
  Administered 2019-07-14: 60 mg via SUBCUTANEOUS

## 2019-07-14 NOTE — Progress Notes (Signed)
Amy [address] Amy Houston   Patient Care Team: Ailene Ards, NP as PCP - General (Nurse Practitioner) Everardo All, MD as Consulting Physician (Hematology and Oncology) Danie Binder, MD (Inactive) as Attending Physician (Gastroenterology)  SUMMARY OF ONCOLOGIC HISTORY: Oncology History  Breast cancer, right breast, IDC, Multifocal  03/19/2012 Initial Diagnosis   Breast cancer, right breast, IDC, Multifocal   05/08/2012 Surgery   Bilateral simple mastectomies by Dr. Margot Chimes   06/03/2012 -  Chemotherapy   Aromatase inhibitor.  PALB2 and CHEK2+ and therefore would benefit from lifelong AI therapy.   01/30/2013 Surgery   Laparotomy, minimal lysis of adhesions, abdominal hysterectomy, bilateral salpingo-oophorectomy.     CHIEF COMPLIANT: Follow-up for breast cancer and osteopenia   INTERVAL HISTORY: Amy Houston is a 74 y.o. female here today for follow up of her breast cancer and osteopenia. Her last visit was on 07/04/2018.   Today she reports that she did not receive her Prolia in 01/2019 due to the pandemic. She continues taking her calcium and vitamin D. Her appetite has been fine and she is eating well. She continues taking the Femara which she has been taking for 7 years.   REVIEW OF SYSTEMS:   Review of Systems  Constitutional: Positive for fatigue (moderate). Negative for appetite change.  Gastrointestinal: Positive for diarrhea (d/t IBS).  All other systems reviewed and are negative.   I have reviewed the past medical history, past surgical history, social history and family history with the patient and they are unchanged from previous note.   ALLERGIES:   is allergic to penicillins.   MEDICATIONS:  Current Outpatient Medications  Medication Sig Dispense Refill  . calcium-vitamin D (OSCAL WITH D) 500-200 MG-UNIT tablet Take 1 tablet by mouth daily.     . Cholecalciferol (VITAMIN D) 2000 units CAPS Take 2,000  Units by mouth at bedtime.    . cyanocobalamin (KP VITAMIN B-12) 1000 MCG tablet Take 1,000 mcg by mouth in the morning and at bedtime.    Marland Kitchen letrozole (FEMARA) 2.5 MG tablet Take 1 tablet (2.5 mg total) by mouth daily. 30 tablet 6  . lisinopril (ZESTRIL) 20 MG tablet Take 1 tablet (20 mg total) by mouth daily. 90 tablet 0  . metoprolol succinate (TOPROL-XL) 25 MG 24 hr tablet Take 25 mg by mouth daily.    . Multiple Vitamin (MULTIVITAMIN WITH MINERALS) TABS Take 1 tablet by mouth at bedtime.     Marland Kitchen omeprazole (PRILOSEC) 20 MG capsule TAKE ONE CAPSULE BY MOUTH ONCE DAILY. 30 capsule 2  . OVER THE COUNTER MEDICATION Take 300 mg by mouth in the morning and at bedtime. [place] Support    . venlafaxine XR (EFFEXOR-XR) 75 MG 24 hr capsule TAKE (1) CAPSULE BY MOUTH TWICE DAILY. 60 capsule 3  . acetaminophen (TYLENOL) 500 MG tablet Take 1,000 mg by mouth daily as needed for moderate pain or headache. (Patient not taking: Reported on 07/14/2019)    . ondansetron (ZOFRAN ODT) 4 MG disintegrating tablet Take 1 tablet (4 mg total) by mouth every 8 (eight) hours as needed for nausea or vomiting. (Patient not taking: Reported on 07/14/2019) 20 tablet 0   No current facility-administered medications for this visit.     PHYSICAL EXAMINATION: Performance status (ECOG): 1 - Symptomatic but completely ambulatory  Vitals:   07/14/19 1137  BP: (!) 157/52  Pulse: 80  Resp: 18  Temp: (!) 97.1 F (36.2 C)  SpO2: 98%   Wt Readings from Last 3 Encounters:  07/14/19 159 lb 6.4 oz (72.3 kg)  04/23/19 158 lb 9.6 oz (71.9 kg)  01/08/19 163 lb 3.2 oz (74 kg)   Physical Exam Vitals reviewed.  Constitutional:      Appearance: Normal appearance.  Cardiovascular:     Rate and Rhythm: Normal rate and regular rhythm.     Pulses: Normal pulses.     Heart sounds: Normal heart sounds.  Pulmonary:     Effort: Pulmonary effort is normal.     Breath sounds: Normal breath sounds.  Chest:      Breasts:        Right: Absent.        Left: Absent.  Lymphadenopathy:     Cervical: No cervical adenopathy.     Upper Body:     Right upper body: No supraclavicular or axillary adenopathy.     Left upper body: No supraclavicular or axillary adenopathy.  Neurological:     General: No focal deficit present.     Mental Status: She is alert and oriented to person, place, and time.  Psychiatric:        Mood and Affect: Mood normal.        Behavior: Behavior normal.     Breast Exam Chaperone: Milinda Antis, MD     LABORATORY DATA:  I have reviewed the data as listed CMP Latest Ref Rng & Units 07/14/2019 04/23/2019 01/08/2019  Glucose 70 - 99 mg/dL 208(H) 139(H) 143(H)  BUN 8 - 23 mg/dL 17 15 22   Creatinine 0.44 - 1.00 mg/dL 0.86 0.88 1.03(H)  Sodium 135 - 145 mmol/L 138 138 139  Potassium 3.5 - 5.1 mmol/L 3.9 4.3 4.6  Chloride 98 - 111 mmol/L 104 102 104  CO2 22 - 32 mmol/L 23 23 22   Calcium 8.9 - 10.3 mg/dL 9.7 10.9(H) 10.5(H)  Total Protein 6.5 - 8.1 g/dL 7.3 7.4 7.7  Total Bilirubin 0.3 - 1.2 mg/dL 0.7 0.7 0.5  Alkaline Phos 38 - 126 U/L 103 - -  AST 15 - 41 U/L 31 27 23   ALT 0 - 44 U/L 36 34(H) 29   No results found for: CAN153 Lab Results  Component Value Date   WBC 6.8 07/14/2019   HGB 12.8 07/14/2019   HCT 39.9 07/14/2019   MCV 96.6 07/14/2019   PLT 306 07/14/2019   NEUTROABS 4.4 07/14/2019   Lab Results  Component Value Date   VD25OH 48 04/23/2019   VD25OH 48 01/08/2019   VD25OH 37.5 06/16/2014    ASSESSMENT:  1.  Stage Ia (T1CN0) right breast IDC: -1.2 cm, grade 2, ER/PR positive, HER-2 negative, positive LVI, Ki 67 of 60%, 0/4 lymph nodes involved, Oncotype DX 14. -Status post bilateral mastectomy on 05/08/2012, bilateral salpingo-oophorectomy on 01/30/2013, Aromasin started on 06/03/2012. -PALB 2 and CHEK2 mutation positive. -She was started on Aromasin, switched to Femara at this time. -MRI of the breast on 08/02/2018 was BI-RADS Category 1.  2.  Lung  nodule: -CT chest on 06/11/2017 shows left upper lobe nodule. -CT chest on 07/09/2019 showed 3 mm nodule in the posterior left upper lobe unchanged.  3 mm posterior right upper lobe nodule stable.  No new nodules or masses.  3.  Osteopenia: -DEXA scan on 04/20/2016 shows T score -2.1. -Prolia started in 2016.  4.  Left breast cancer: -Diagnosed in 1996, underwent lumpectomy, CMF x8 cycles followed by radiation.  5.  Family history: -Brother had thyroid cancer, another brother  had lymphoma, sister had lung cancer and was a non-smoker.  Father had leukemia.  Mother had lung cancer and was a smoker.  First cousin had breast cancer.  Daughter at age 41 has breast cancer.  Daughter was also reportedly positive for PALB 2.   PLAN:  1.  Stage Ia (T1CN0) right breast IDC: -Bilateral mastectomy sites are within normal limits. -Review of labs from 07/14/2019 shows normal LFTs. -She will be scheduled for bilateral breast mammogram on 08/02/2019. -I will see her back in 1 year for follow-up after breast MRI and labs.  2.  Lung nodule: -I reviewed results of the CT chest without contrast from 07/09/2019. -No new or progressive findings.  Tiny bilateral pulmonary nodules are unchanged in the interval of 2 years. -We will not do any further scans.  3.  Osteopenia: -She missed her Prolia injection in January. -Calcium is within normal limits.  She will proceed with Prolia every 6 months.  4.  Left breast cancer: -Status post mastectomy.  No evidence of recurrence.    No orders of the defined types were placed in this encounter.  The patient has a good understanding of the overall plan. she agrees with it. she will call with any problems that may develop before the next visit here.    Derek Jack, MD [facility] [phone]   I, Milinda Antis, am acting as a scribe for Dr. Sanda Linger.  I, Derek Jack MD, have reviewed the above documentation for  accuracy and completeness, and I agree with the above.

## 2019-07-14 NOTE — Patient Instructions (Signed)
[facility] at [facility] Discharge Instructions  You were seen today by Dr. Delton Coombes. He went over your recent results. You received your injection today. Continue your usual care with your primary care provider. You will be scheduled for an MRI of your breasts. Your next injection will occur in 6 months. Dr. Delton Coombes will see you back in 1 year for labs and follow up.   Thank you for choosing [facility] at [facility] to provide your oncology and hematology care.  To afford each patient quality time with our provider, please arrive at least 15 minutes before your scheduled appointment time.   If you have a lab appointment with the [facility] please come in thru the Main Entrance and check in at the main information desk  You need to re-schedule your appointment should you arrive 10 or more minutes late.  We strive to give you quality time with our providers, and arriving late affects you and other patients whose appointments are after yours.  Also, if you no show three or more times for appointments you may be dismissed from the clinic at the providers discretion.     Again, thank you for choosing [facility].  Our hope is that these requests will decrease the amount of time that you wait before being seen by our physicians.       _____________________________________________________________  Should you have questions after your visit to [facility], please contact our office at [phone] between the hours of 8:00 a.m. and 4:30 p.m.  Voicemails left after 4:00 p.m. will not be returned until the following business day.  For prescription refill requests, have your pharmacy contact our office and allow 72 hours.    Cancer Center Support Programs:   > Cancer Support Group  2nd Tuesday of the month 1pm-2pm, Journey Room

## 2019-07-17 NOTE — Addendum Note (Signed)
Addended by: Farley Ly on: 07/17/2019 04:24 PM   Modules accepted: Orders

## 2019-07-24 ENCOUNTER — Other Ambulatory Visit: Payer: Self-pay

## 2019-07-24 ENCOUNTER — Encounter (INDEPENDENT_AMBULATORY_CARE_PROVIDER_SITE_OTHER): Payer: Self-pay | Admitting: Nurse Practitioner

## 2019-07-24 ENCOUNTER — Ambulatory Visit (INDEPENDENT_AMBULATORY_CARE_PROVIDER_SITE_OTHER): Payer: PPO | Admitting: Nurse Practitioner

## 2019-07-24 VITALS — BP 124/60 | HR 82 | Resp 16 | Ht 58.5 in | Wt 161.4 lb

## 2019-07-24 DIAGNOSIS — I10 Essential (primary) hypertension: Secondary | ICD-10-CM

## 2019-07-24 DIAGNOSIS — E119 Type 2 diabetes mellitus without complications: Secondary | ICD-10-CM

## 2019-07-24 DIAGNOSIS — E559 Vitamin D deficiency, unspecified: Secondary | ICD-10-CM | POA: Diagnosis not present

## 2019-07-24 DIAGNOSIS — K219 Gastro-esophageal reflux disease without esophagitis: Secondary | ICD-10-CM | POA: Diagnosis not present

## 2019-07-24 NOTE — Progress Notes (Signed)
Subjective:  Patient ID: Amy Houston, female    DOB: 1945/05/31  Age: 74 y.o. MRN: [id number]  CC:  Chief Complaint  Patient presents with  . Follow-up    Vitamin D Deficiency  . Hypertension  . Gastroesophageal Reflux  . Diabetes      HPI  This patient comes in today for the above.  Vitamin D deficiency: She has a history of vitamin D deficiency.  At her last office visit her serum level was 48 and she was told to increase her vitamin D3 from 3000 to 6000 IUs daily.  She tells me she ran out of the vitamin D3 a few days ago and has been started taking it again.  She does plan on picking up a new bottle.  Hypertension: She continues on her oral antihypertensives and is tolerating these well.  GERD: She continues on omeprazole and tells me that her heartburn symptoms are well controlled.  Diabetes: She has been diagnosed as a type II diabetic since 1999.  She has always been diet controlled.  She continues to be diet controlled.  Last A1c was 6.8 back in April.  She is not on a statin which but she is on an ACE inhibitor.  She tells me she is due for eye exam and plans on getting this scheduled soon.  She tells me she is due for foot exam.  She also mentions to me today that her daughter was recently diagnosed with thyroid cancer and she does have a brother who had thyroid cancer.  Per chart review I do see that thyroid function tests were completed back in January of this year and all those were normal.  She denies any symptoms of palpitations, unexplainable weight loss, worsening fatigue, etc.  She also brings with her a keto supplement that she is wondering whether or not I would recommend she take.   Past Medical History:  Diagnosis Date  . Abnormal genetic test 04/09/2013   PALB2 and CHEK2 positivity   . Arthritis    "of the spine" (05/08/2012)  . Breast cancer ([place]) 1995   "left" (05/08/2012)  . Breast cancer, right breast, IDC, Multifocal 03/12/2012   Multifocal, 10 and  8 left breast   . Diabetes mellitus 1999   "dx; never on meds" (05/08/2012)  . Diabetes mellitus without complication ([place]) 04/04/3293  . Essential hypertension, benign 01/08/2019  . Exertional shortness of breath   . Family history of thyroid cancer   . GERD (gastroesophageal reflux disease)    tums  . H/O hiatal hernia   . Hemorrhoid   . History of blood transfusion    post child birth  . HTN (hypertension)   . IBS (irritable bowel syndrome)   . Osteopenia 12/11/2012   On Ca++ and Vit D. Bone density in April 2014.  Next bone density due in April 2016.  Marland Kitchen PONV (postoperative nausea and vomiting)   . PPD positive, treated 1999      Family History  Problem Relation Age of Onset  . Lung cancer Mother 67       smoker  . Leukemia Father 64       Hairy Cell at 77, CLL at 106  . Lymphoma Father 61       Hodgkins lymphoma  . Lung cancer Sister 70  . Thyroid cancer Brother 46       Medullary  . Lymphoma Brother 60       Follicular lymphoma  . Breast  cancer Maternal Aunt        mother's paternal half sister  . Lung cancer Maternal Uncle        mother's paternal half brother  . Lung cancer Maternal Grandmother        smoker  . Other Daughter        leiomyoma of esophagus  . Colon cancer Maternal Aunt        mother's maternal half sister  . Cancer Paternal Uncle        oral cancer dx in his 20s  . Lung cancer Cousin        paternal cousin  . Breast cancer Cousin        paternal cousin died in her 45s    Social History   Social History Narrative   Married 17 yrs.Lives with wife .Retired.   Social History   Tobacco Use  . Smoking status: Never Smoker  . Smokeless tobacco: Never Used  Substance Use Topics  . Alcohol use: Yes    Comment: 05/08/2012 "margarita or glass of wine once or twice/yr"     Current Meds  Medication Sig  . acetaminophen (TYLENOL) 500 MG tablet Take 1,000 mg by mouth daily as needed for moderate pain or headache.   . calcium-vitamin D (OSCAL  WITH D) 500-200 MG-UNIT tablet Take 1 tablet by mouth daily.   . Cholecalciferol (VITAMIN D) 2000 units CAPS Take 2,000 Units by mouth at bedtime.  . cyanocobalamin (KP VITAMIN B-12) 1000 MCG tablet Take 1,000 mcg by mouth in the morning and at bedtime.  Marland Kitchen letrozole (FEMARA) 2.5 MG tablet Take 1 tablet (2.5 mg total) by mouth daily.  Marland Kitchen lisinopril (ZESTRIL) 20 MG tablet Take 1 tablet (20 mg total) by mouth daily.  . metoprolol succinate (TOPROL-XL) 25 MG 24 hr tablet Take 25 mg by mouth daily.  . Multiple Vitamin (MULTIVITAMIN WITH MINERALS) TABS Take 1 tablet by mouth at bedtime.   Marland Kitchen omeprazole (PRILOSEC) 20 MG capsule TAKE ONE CAPSULE BY MOUTH ONCE DAILY.  Marland Kitchen ondansetron (ZOFRAN ODT) 4 MG disintegrating tablet Take 1 tablet (4 mg total) by mouth every 8 (eight) hours as needed for nausea or vomiting.  Marland Kitchen OVER THE COUNTER MEDICATION Take 300 mg by mouth in the morning and at bedtime. [place] Support  . venlafaxine XR (EFFEXOR-XR) 75 MG 24 hr capsule TAKE (1) CAPSULE BY MOUTH TWICE DAILY.    ROS:  Review of Systems  Constitutional: Negative.   Respiratory: Negative.   Cardiovascular: Negative.   Neurological: Negative.      Objective:   Today's Vitals: BP (!) 124/60   Pulse 82   Resp 16   Ht 4' 10.5" (1.486 m)   Wt 161 lb 6.4 oz (73.2 kg)   SpO2 97%   BMI 33.16 kg/m  Vitals with BMI 07/24/2019 07/14/2019 04/23/2019  Height 4' 10.5" - 4' 10.5"  Weight 161 lbs 6 oz 159 lbs 6 oz 158 lbs 10 oz  BMI 35.52 - 17.47  Systolic 159 539 672  Diastolic 60 52 75  Pulse 82 80 90     Physical Exam Vitals reviewed.  Constitutional:      General: She is not in acute distress.    Appearance: Normal appearance.  HENT:     Head: Normocephalic and atraumatic.  Neck:     Thyroid: No thyroid mass, thyromegaly or thyroid tenderness.     Vascular: No carotid bruit.  Cardiovascular:     Rate and Rhythm: Normal  rate and regular rhythm.     Pulses: Normal pulses.           Dorsalis pedis pulses are 2+ on the right side and 2+ on the left side.     Heart sounds: Normal heart sounds.  Pulmonary:     Effort: Pulmonary effort is normal.     Breath sounds: Normal breath sounds.  Musculoskeletal:     Right foot: No deformity.     Left foot: No deformity.  Feet:     Right foot:     Protective Sensation: 10 sites tested. 10 sites sensed.     Skin integrity: Skin integrity normal.     Toenail Condition: Right toenails are normal.     Left foot:     Protective Sensation: 10 sites tested. 10 sites sensed.     Skin integrity: Skin integrity normal.     Toenail Condition: Left toenails are normal.  Lymphadenopathy:     Cervical: No cervical adenopathy.  Skin:    General: Skin is warm and dry.  Neurological:     General: No focal deficit present.     Mental Status: She is alert and oriented to person, place, and time.  Psychiatric:        Mood and Affect: Mood normal.        Behavior: Behavior normal.        Judgment: Judgment normal.          Assessment and Plan   1. Diabetes mellitus without complication ([place])   2. Essential hypertension, benign   3. Vitamin D deficiency disease   4. Gastroesophageal reflux disease without esophagitis      Plan: 1.  Foot exam completed today, she was encouraged to follow-up with her eye doctor to have her yearly diabetic eye exam.  I would like to check A1c and urine for albuminuria, however I would hold off on doing this until her next office visit because I would hold off on checking her vitamin D level until that time as well.  She was encouraged to continue focusing on her diet and to avoid processed carbohydrates, red meat, processed meat, and include vegetables, fruit, legumes, lean protein and fish.  I encouraged her to continue taking her lisinopril and we have a fairly long discussion regarding statin therapy and the role it plays in primary prevention of arterial-vascular disease especially in someone with a  history of type 2 diabetes.  For now she decided that she is going to hold off on starting statin but will consider this for the future.  As for the keto supplement I did tell her that I cannot make recommendations or statements are concerning its safety or efficacy as its not regulated by the FDA.  I told her if she decides to try the supplement that if she feels unwell in any way she needs to stop it right away.  She tells me she understands.  We also discussed the risk of it interacting with her other medications and that if she tries that she should definitely try to take it apart from her other medications.  I have added family history of thyroid cancer to her pertinent medical history, as she should not be on a GLP-1 in the future due to this.  I do not see a need to check her thyroid panel or send for ultrasound as no thyromegaly or thyroid nodules were noted on exam, and she is not experiencing any symptoms associated with thyroid disease.  2.  She will continue on her current medications as prescribed.  3.  She will continue on her vitamin D3 supplement, will check a serum level at next office visit.  4.  She will continue on her omeprazole as currently prescribed.   Tests ordered Orders Placed This Encounter  Procedures  . Microalbumin/Creatinine Ratio, Urine  . Hemoglobin A1c  . CMP with eGFR([facility])      No orders of the defined types were placed in this encounter.   Patient to follow-up in 6 weeks or sooner as needed  Ailene Ards, NP

## 2019-08-07 ENCOUNTER — Other Ambulatory Visit: Payer: Self-pay

## 2019-08-07 ENCOUNTER — Ambulatory Visit (HOSPITAL_COMMUNITY)
Admission: RE | Admit: 2019-08-07 | Discharge: 2019-08-07 | Disposition: A | Discharge status: Home / Self Care | Payer: PPO | Source: Ambulatory Visit | Attending: Hematology | Admitting: Hematology

## 2019-08-07 DIAGNOSIS — C50111 Malignant neoplasm of central portion of right female breast: Secondary | ICD-10-CM | POA: Diagnosis not present

## 2019-08-07 DIAGNOSIS — Z170 Estrogen receptor positive status [ER+]: Secondary | ICD-10-CM | POA: Diagnosis not present

## 2019-08-07 DIAGNOSIS — N6489 Other specified disorders of breast: Secondary | ICD-10-CM | POA: Diagnosis not present

## 2019-08-07 MED ORDER — GADOBUTROL 1 MMOL/ML IV SOLN
7.0000 mL | Freq: Once | INTRAVENOUS | Status: AC | PRN
  Administered 2019-08-07: 7 mL via INTRAVENOUS

## 2019-08-08 ENCOUNTER — Other Ambulatory Visit (INDEPENDENT_AMBULATORY_CARE_PROVIDER_SITE_OTHER): Payer: Self-pay | Admitting: Internal Medicine

## 2019-08-21 ENCOUNTER — Other Ambulatory Visit: Payer: Self-pay

## 2019-08-21 ENCOUNTER — Ambulatory Visit
Admission: EM | Admit: 2019-08-21 | Discharge: 2019-08-21 | Disposition: A | Discharge status: Home / Self Care | Payer: PPO | Attending: Emergency Medicine | Admitting: Emergency Medicine

## 2019-08-21 ENCOUNTER — Encounter: Payer: Self-pay | Admitting: Emergency Medicine

## 2019-08-21 DIAGNOSIS — Z1152 Encounter for screening for COVID-19: Secondary | ICD-10-CM | POA: Diagnosis not present

## 2019-08-21 DIAGNOSIS — R05 Cough: Secondary | ICD-10-CM | POA: Diagnosis not present

## 2019-08-21 DIAGNOSIS — J069 Acute upper respiratory infection, unspecified: Secondary | ICD-10-CM

## 2019-08-21 MED ORDER — FLUTICASONE PROPIONATE 50 MCG/ACT NA SUSP
1.0000 | Freq: Every day | NASAL | 0 refills | Status: DC
Start: 2019-08-21 — End: 2020-03-05

## 2019-08-21 MED ORDER — CETIRIZINE HCL 10 MG PO TABS: 10.0000 mg | ORAL_TABLET | Freq: Every day | ORAL | 0 refills | Status: DC

## 2019-08-21 MED ORDER — PREDNISONE 10 MG PO TABS
20.0000 mg | ORAL_TABLET | Freq: Every day | ORAL | 0 refills | Status: DC
Start: 2019-08-21 — End: 2020-01-27

## 2019-08-21 MED ORDER — BENZONATATE 100 MG PO CAPS
100.0000 mg | ORAL_CAPSULE | Freq: Three times a day (TID) | ORAL | 0 refills | Status: DC
Start: 2019-08-21 — End: 2019-12-15

## 2019-08-21 NOTE — ED Provider Notes (Signed)
[facility]   [id number] 08/21/19 Arrival Time: 1227   CC: COVID symptoms  SUBJECTIVE: History from: patient.  Amy Houston is a 74 y.o. female who presents to the urgent care for complaint of cough, nasal congestion, scratchy throat started the past 2-3.  Denies sick exposure to COVID, flu or strep.  Denies recent travel.  Has tried  OTC medication without relief.  Denies aggravating factors.  Denies previous symptoms in the past.   Denies fever, chills, fatigue, sinus pain, rhinorrhea, sore throat, SOB, wheezing, chest pain, nausea, changes in bowel or bladder habits.     ROS: As per HPI.  All other pertinent ROS negative .     Past Medical History:  Diagnosis Date  . Abnormal genetic test 04/09/2013   PALB2 and CHEK2 positivity   . Arthritis    "of the spine" (05/08/2012)  . Breast cancer ([place] [facility]) 1995   "left" (05/08/2012)  . Breast cancer, right breast, IDC, Multifocal 03/12/2012   Multifocal, 10 and 8 left breast   . Diabetes mellitus 1999   "dx; never on meds" (05/08/2012)  . Diabetes mellitus without complication ([place]) 0/0/9233  . Essential hypertension, benign 01/08/2019  . Exertional shortness of breath   . Family history of thyroid cancer   . GERD (gastroesophageal reflux disease)    tums  . H/O hiatal hernia   . Hemorrhoid   . History of blood transfusion    post child birth  . HTN (hypertension)   . IBS (irritable bowel syndrome)   . Osteopenia 12/11/2012   On Ca++ and Vit D. Bone density in April 2014.  Next bone density due in April 2016.  Marland Kitchen PONV (postoperative nausea and vomiting)   . PPD positive, treated 1999   Past Surgical History:  Procedure Laterality Date  . ABDOMINAL HYSTERECTOMY Bilateral 01/30/2013   Procedure: HYSTERECTOMY ABDOMINAL;  Surgeon: Melina Schools, MD;  Location: [facility];  Service: Gynecology;  Laterality: Bilateral;  . BREAST BIOPSY Left 1995  . BREAST BIOPSY Bilateral 03/2012   "one on the left; 2 on the right" (05/08/2012)  .  BREAST LUMPECTOMY Left 1995  . CHOLECYSTECTOMY  1992  . COLONOSCOPY  12/06/93   Dr. Hayes:single diminutive polyp of the descending colon/extrernal hemorrhoids, benign path  . COLONOSCOPY  11/30/98   Dr Hayes:small rectal polyp/internal hemorrhoids, benign path  . COLONOSCOPY  11/26/2006   Dr. Hayes:sigmoid polyp/small interal hemorrhoids, adenomatous  . COLONOSCOPY N/A 03/30/2016   Dr. Oneida Alar: normal TI, 4 mm rectal polyp, multiple small and large-mouthed diverticula in rectosigmoid and sigmoid colon, non-bleeding internal hemorrhoids, tubular adenoma on path. surveillance in 5 yeras  . COLONOSCOPY WITH ESOPHAGOGASTRODUODENOSCOPY (EGD) N/A 07/01/2012   Dr. Oneida Alar: 2 small adenomas, sigmoid colon diverticula, surveillance in 2019  . DILATION AND CURETTAGE OF UTERUS  1968  . ESOPHAGEAL DILATION  07/01/2012   Dr. Oneida Alar: proximal esophageal web, Schatzki's ring at GE junction, s/p dilaiton. Mild non-erosive gastritis. Repeat EGD July 23, 2012.   . ESOPHAGOGASTRODUODENOSCOPY  1994   Dr. Cristina Gong: small hiatal hernia and Schatzki's ring widely patent, CLO test negative  . ESOPHAGOGASTRODUODENOSCOPY (EGD) WITH ESOPHAGEAL DILATION N/A 07/23/2012   Dr. Fields:proximal esophageal web v. radiation induced stricture/schatzki ring/small HH, s/p dilation  . MASTECTOMY COMPLETE / SIMPLE Left 05/08/2012  . MASTECTOMY COMPLETE / SIMPLE W/ SENTINEL NODE BIOPSY Right 05/08/2012  . SALPINGOOPHORECTOMY Bilateral 01/30/2013   Procedure: SALPINGO OOPHORECTOMY;  Surgeon: Melina Schools, MD;  Location: [facility];  Service: Gynecology;  Laterality: Bilateral;  .  SIMPLE MASTECTOMY WITH AXILLARY SENTINEL NODE BIOPSY Right 05/08/2012   Procedure: RIGHT TOTAL MASTECTOMY WITH AXILLARY SENTINEL NODE BIOPSY;  Surgeon: Haywood Lasso, MD;  Location: [facility];  Service: General;  Laterality: Right;  . SIMPLE MASTECTOMY WITH AXILLARY SENTINEL NODE BIOPSY Left 05/08/2012   Procedure: LEFT TOTAL MASTECTOMY;  Surgeon: Haywood Lasso, MD;   Location: [facility];  Service: General;  Laterality: Left;  . TONSILLECTOMY AND ADENOIDECTOMY  ~ 2008  . TUBAL LIGATION  1976  . UVULOPALATOPHARYNGOPLASTY, TONSILLECTOMY AND SEPTOPLASTY  ~ 2008   Allergies  Allergen Reactions  . Penicillins Rash    Has patient had a PCN reaction causing immediate rash, facial/tongue/throat swelling, SOB or lightheadedness with hypotension: Yes Has patient had a PCN reaction causing severe rash involving mucus membranes or skin necrosis: No Has patient had a PCN reaction that required hospitalization No Has patient had a PCN reaction occurring within the last 10 years: No If all of the above answers are "NO", then may proceed with Cephalosporin use.    No current facility-administered medications on file prior to encounter.   Current Outpatient Medications on File Prior to Encounter  Medication Sig Dispense Refill  . acetaminophen (TYLENOL) 500 MG tablet Take 1,000 mg by mouth daily as needed for moderate pain or headache.     . calcium-vitamin D (OSCAL WITH D) 500-200 MG-UNIT tablet Take 1 tablet by mouth daily.     . Cholecalciferol (VITAMIN D) 2000 units CAPS Take 2,000 Units by mouth at bedtime.    . cyanocobalamin (KP VITAMIN B-12) 1000 MCG tablet Take 1,000 mcg by mouth in the morning and at bedtime.    Marland Kitchen letrozole (FEMARA) 2.5 MG tablet Take 1 tablet (2.5 mg total) by mouth daily. 30 tablet 6  . lisinopril (ZESTRIL) 20 MG tablet Take 1 tablet (20 mg total) by mouth daily. 90 tablet 0  . metoprolol succinate (TOPROL-XL) 25 MG 24 hr tablet TAKE 1 TABLET BY MOUTH ONCE A DAY. 90 tablet 0  . Multiple Vitamin (MULTIVITAMIN WITH MINERALS) TABS Take 1 tablet by mouth at bedtime.     Marland Kitchen omeprazole (PRILOSEC) 20 MG capsule TAKE ONE CAPSULE BY MOUTH ONCE DAILY. 30 capsule 2  . ondansetron (ZOFRAN ODT) 4 MG disintegrating tablet Take 1 tablet (4 mg total) by mouth every 8 (eight) hours as needed for nausea or vomiting. 20 tablet 0  . OVER THE COUNTER MEDICATION  Take 300 mg by mouth in the morning and at bedtime. [place] Support    . venlafaxine XR (EFFEXOR-XR) 75 MG 24 hr capsule TAKE (1) CAPSULE BY MOUTH TWICE DAILY. 60 capsule 3   Social History   Socioeconomic History  . Marital status: Married    Spouse name: Not on file  . Number of children: 2  . Years of education: 12th grade  . Highest education level: Not on file  Occupational History  . Occupation: retired    Fish farm manager: [employer]  Tobacco Use  . Smoking status: Never Smoker  . Smokeless tobacco: Never Used  Vaping Use  . Vaping Use: Never used  Substance and Sexual Activity  . Alcohol use: Yes    Comment: 05/08/2012 "margarita or glass of wine once or twice/yr"  . Drug use: No  . Sexual activity: Never  Other Topics Concern  . Not on file  Social History Narrative   Married 35 yrs.Lives with wife .Retired.   Social Determinants of Health   Financial Resource Strain:   .  Difficulty of Paying Living Expenses: Not on file  Food Insecurity:   . Worried About Charity fundraiser in the Last Year: Not on file  . Ran Out of Food in the Last Year: Not on file  Transportation Needs:   . Lack of Transportation (Medical): Not on file  . Lack of Transportation (Non-Medical): Not on file  Physical Activity:   . Days of Exercise per Week: Not on file  . Minutes of Exercise per Session: Not on file  Stress:   . Feeling of Stress : Not on file  Social Connections:   . Frequency of Communication with Friends and Family: Not on file  . Frequency of Social Gatherings with Friends and Family: Not on file  . Attends Religious Services: Not on file  . Active Member of Clubs or Organizations: Not on file  . Attends Archivist Meetings: Not on file  . Marital Status: Not on file  Intimate Partner Violence:   . Fear of Current or Ex-Partner: Not on file  . Emotionally Abused: Not on file  . Physically Abused: Not on file  . Sexually Abused: Not on  file   Family History  Problem Relation Age of Onset  . Lung cancer Mother 20       smoker  . Leukemia Father 53       Hairy Cell at 66, CLL at 43  . Lymphoma Father 90       Hodgkins lymphoma  . Lung cancer Sister 72  . Thyroid cancer Brother 46       Medullary  . Lymphoma Brother 60       Follicular lymphoma  . Breast cancer Maternal Aunt        mother's paternal half sister  . Lung cancer Maternal Uncle        mother's paternal half brother  . Lung cancer Maternal Grandmother        smoker  . Other Daughter        leiomyoma of esophagus  . Colon cancer Maternal Aunt        mother's maternal half sister  . Cancer Paternal Uncle        oral cancer dx in his 58s  . Lung cancer Cousin        paternal cousin  . Breast cancer Cousin        paternal cousin died in her 28s    OBJECTIVE:  Vitals:   08/21/19 1249 08/21/19 1251  BP:  (!) 144/72  Pulse:  71  Resp:  18  Temp:  99.5 F (37.5 C)  TempSrc:  Oral  SpO2:  100%  Weight: 160 lb (72.6 kg)   Height: 4' 10" (1.473 m)      General appearance: alert; appears fatigued, but nontoxic; speaking in full sentences and tolerating own secretions HEENT: NCAT; Ears: EACs clear, TMs pearly gray; Eyes: PERRL.  EOM grossly intact. Sinuses: nontender; Nose: nares patent without rhinorrhea, Throat: oropharynx clear, tonsils non erythematous or enlarged, uvula midline  Neck: supple without LAD Lungs: unlabored respirations, symmetrical air entry; cough: moderate; no respiratory distress; CTAB Heart: regular rate and rhythm.  Radial pulses 2+ symmetrical bilaterally Skin: warm and dry Psychological: alert and cooperative; normal mood and affect  LABS:  No results found for this or any previous visit (from the past 24 hour(s)).   ASSESSMENT & PLAN:  1. URI with cough and congestion   2. Encounter for screening for COVID-19  Meds ordered this encounter  Medications  . cetirizine (ZYRTEC ALLERGY) 10 MG tablet    Sig:  Take 1 tablet (10 mg total) by mouth daily.    Dispense:  30 tablet    Refill:  0  . fluticasone (FLONASE) 50 MCG/ACT nasal spray    Sig: Place 1 spray into both nostrils daily for 14 days.    Dispense:  16 g    Refill:  0  . predniSONE (DELTASONE) 10 MG tablet    Sig: Take 2 tablets (20 mg total) by mouth daily.    Dispense:  15 tablet    Refill:  0  . benzonatate (TESSALON) 100 MG capsule    Sig: Take 1 capsule (100 mg total) by mouth every 8 (eight) hours.    Dispense:  30 capsule    Refill:  0    Discharge Instructions  COVID testing ordered.  It will take between 2-7 days for test results.  Someone will contact you regarding abnormal results.    In the meantime: You should remain isolated in your home for 10 days from symptom onset AND greater than 24 hours after symptoms resolution (absence of fever without the use of fever-reducing medication and improvement in respiratory symptoms), whichever is longer Get plenty of rest and push fluids Tessalon Perles prescribed for cough Zyrtec for nasal congestion, runny nose, and/or sore throat Flonase for nasal congestion and runny nose Low-dose prednisone was prescribed Use medications daily for symptom relief Use OTC medications like ibuprofen or tylenol as needed fever or pain Call or go to the ED if you have any new or worsening symptoms such as fever, worsening cough, shortness of breath, chest tightness, chest pain, turning blue, changes in mental status, etc...   Reviewed expectations re: course of current medical issues. Questions answered. Outlined signs and symptoms indicating need for more acute intervention. Patient verbalized understanding. After Visit Summary given.      Note: This document was prepared using Dragon voice recognition software and may include unintentional dictation errors.    ,  S, FNP 08/21/19 1314  

## 2019-08-21 NOTE — Discharge Instructions (Signed)
COVID testing ordered.  It will take between 2-7 days for test results.  Someone will contact you regarding abnormal results.    In the meantime: You should remain isolated in your home for 10 days from symptom onset AND greater than 24 hours after symptoms resolution (absence of fever without the use of fever-reducing medication and improvement in respiratory symptoms), whichever is longer Get plenty of rest and push fluids Tessalon Perles prescribed for cough Zyrtec for nasal congestion, runny nose, and/or sore throat Flonase for nasal congestion and runny nose Low-dose prednisone was prescribed Use medications daily for symptom relief Use OTC medications like ibuprofen or tylenol as needed fever or pain Call or go to the ED if you have any new or worsening symptoms such as fever, worsening cough, shortness of breath, chest tightness, chest pain, turning blue, changes in mental status, etc...  

## 2019-08-21 NOTE — ED Triage Notes (Signed)
Runny nose ,scratchy throat, cough since Tuesday.

## 2019-08-23 ENCOUNTER — Ambulatory Visit
Admission: EM | Admit: 2019-08-23 | Discharge: 2019-08-23 | Disposition: A | Discharge status: Home / Self Care | Payer: PPO | Attending: Family Medicine | Admitting: Family Medicine

## 2019-08-23 DIAGNOSIS — Z1152 Encounter for screening for COVID-19: Secondary | ICD-10-CM

## 2019-08-23 DIAGNOSIS — Z7689 Persons encountering health services in other specified circumstances: Secondary | ICD-10-CM | POA: Diagnosis not present

## 2019-08-23 LAB — NOVEL CORONAVIRUS, NAA

## 2019-08-23 NOTE — ED Triage Notes (Signed)
Pt here for recollection of covid due to inadequate sample

## 2019-08-24 LAB — SARS-COV-2, NAA 2 DAY TAT

## 2019-08-24 LAB — NOVEL CORONAVIRUS, NAA: SARS-CoV-2, NAA: NOT DETECTED

## 2019-09-04 ENCOUNTER — Ambulatory Visit (INDEPENDENT_AMBULATORY_CARE_PROVIDER_SITE_OTHER): Payer: PPO | Admitting: Nurse Practitioner

## 2019-09-04 ENCOUNTER — Other Ambulatory Visit: Payer: Self-pay

## 2019-09-04 ENCOUNTER — Encounter (INDEPENDENT_AMBULATORY_CARE_PROVIDER_SITE_OTHER): Payer: Self-pay | Admitting: Nurse Practitioner

## 2019-09-04 VITALS — BP 140/70 | HR 81 | Temp 97.3°F | Ht 58.5 in | Wt 161.0 lb

## 2019-09-04 DIAGNOSIS — E669 Obesity, unspecified: Secondary | ICD-10-CM

## 2019-09-04 DIAGNOSIS — E7800 Pure hypercholesterolemia, unspecified: Secondary | ICD-10-CM | POA: Insufficient documentation

## 2019-09-04 DIAGNOSIS — E785 Hyperlipidemia, unspecified: Secondary | ICD-10-CM | POA: Insufficient documentation

## 2019-09-04 DIAGNOSIS — Z6833 Body mass index (BMI) 33.0-33.9, adult: Secondary | ICD-10-CM

## 2019-09-04 DIAGNOSIS — Z808 Family history of malignant neoplasm of other organs or systems: Secondary | ICD-10-CM | POA: Diagnosis not present

## 2019-09-04 DIAGNOSIS — E559 Vitamin D deficiency, unspecified: Secondary | ICD-10-CM

## 2019-09-04 DIAGNOSIS — E119 Type 2 diabetes mellitus without complications: Secondary | ICD-10-CM | POA: Diagnosis not present

## 2019-09-04 DIAGNOSIS — I10 Essential (primary) hypertension: Secondary | ICD-10-CM | POA: Diagnosis not present

## 2019-09-04 NOTE — Patient Instructions (Signed)
Why follow it? Research shows  Those who follow the Mediterranean diet have a reduced risk of heart disease   The diet is associated with a reduced incidence of Parkinson's and Alzheimer's diseases  People following the diet may have longer life expectancies and lower rates of chronic diseases   The Dietary Guidelines for Americans recommends the Mediterranean diet as an eating plan to promote health and prevent disease  What Is the Mediterranean Diet?   Healthy eating plan based on typical foods and recipes of Mediterranean-style cooking  The diet is primarily a plant based diet; these foods should make up a majority of meals   Starches - Plant based foods should make up a majority of meals - They are an important sources of vitamins, minerals, energy, antioxidants, and fiber - Choose whole grains, foods high in fiber and minimally processed items  - Typical grain sources include wheat, oats, barley, corn, brown rice, bulgar, farro, millet, polenta, couscous  - Various types of beans include chickpeas, lentils, fava beans, black beans, white beans   Fruits  Veggies - Large quantities of antioxidant rich fruits & veggies; 6 or more servings  - Vegetables can be eaten raw or lightly drizzled with oil and cooked  - Vegetables common to the traditional Mediterranean Diet include: artichokes, arugula, beets, broccoli, brussel sprouts, cabbage, carrots, celery, collard greens, cucumbers, eggplant, kale, leeks, lemons, lettuce, mushrooms, okra, onions, peas, peppers, potatoes, pumpkin, radishes, rutabaga, shallots, spinach, sweet potatoes, turnips, zucchini - Fruits common to the Mediterranean Diet include: apples, apricots, avocados, cherries, clementines, dates, figs, grapefruits, grapes, melons, nectarines, oranges, peaches, pears, pomegranates, strawberries, tangerines  Fats - Replace butter and margarine with healthy oils, such as olive oil, canola oil, and tahini  - Limit nuts to no  more than a handful a day  - Nuts include walnuts, almonds, pecans, pistachios, pine nuts  - Limit or avoid candied, honey roasted or heavily salted nuts - Olives are central to the [employer] - can be eaten whole or used in a variety of dishes   Meats Protein - Limiting red meat: no more than a few times a month - When eating red meat: choose lean cuts and keep the portion to the size of deck of cards - Eggs: approx. 0 to 4 times a week  - Fish and lean poultry: at least 2 a week  - Healthy protein sources include, chicken, [place], lean beef, lamb - Increase intake of seafood such as tuna, salmon, trout, mackerel, shrimp, scallops - Avoid or limit high fat processed meats such as sausage and bacon  Dairy - Include moderate amounts of low fat dairy products  - Focus on healthy dairy such as fat free yogurt, skim milk, low or reduced fat cheese - Limit dairy products higher in fat such as whole or 2% milk, cheese, ice cream  Alcohol - Moderate amounts of red wine is ok  - No more than 5 oz daily for women (all ages) and men older than age 55  - No more than 10 oz of wine daily for men younger than 15  Other - Limit sweets and other desserts  - Use herbs and spices instead of salt to flavor foods  - Herbs and spices common to the traditional Mediterranean Diet include: basil, bay leaves, chives, cloves, cumin, fennel, garlic, lavender, marjoram, mint, oregano, parsley, pepper, rosemary, sage, savory, sumac, tarragon, thyme   Its not just a diet, its a lifestyle:   The Mediterranean diet includes  lifestyle factors typical of those in the region   Foods, drinks and meals are best eaten with others and savored  Daily physical activity is important for overall good health  This could be strenuous exercise like running and aerobics  This could also be more leisurely activities such as walking, housework, yard-work, or taking the stairs  Moderation is the key; a balanced and  healthy diet accommodates most foods and drinks  Consider portion sizes and frequency of consumption of certain foods   Meal Ideas & Options:   Breakfast:  o Whole wheat toast or whole wheat English muffins with peanut butter & hard boiled egg o Steel cut oats topped with apples & cinnamon and skim milk  o Fresh fruit: banana, strawberries, melon, berries, peaches  o Smoothies: strawberries, bananas, greek yogurt, peanut butter o Low fat greek yogurt with blueberries and granola  o Egg white omelet with spinach and mushrooms o Breakfast couscous: whole wheat couscous, apricots, skim milk, cranberries   Sandwiches:  o Hummus and grilled vegetables (peppers, zucchini, squash) on whole wheat bread   o Grilled chicken on whole wheat pita with lettuce, tomatoes, cucumbers or tzatziki  o Tuna salad on whole wheat bread: tuna salad made with greek yogurt, olives, red peppers, capers, green onions o Garlic rosemary lamb pita: lamb sauted with garlic, rosemary, salt & pepper; add lettuce, cucumber, greek yogurt to pita - flavor with lemon juice and black pepper   Seafood:  o Mediterranean grilled salmon, seasoned with garlic, basil, parsley, lemon juice and black pepper o Shrimp, lemon, and spinach whole-grain pasta salad made with low fat greek yogurt  o Seared scallops with lemon orzo  o Seared tuna steaks seasoned salt, pepper, coriander topped with tomato mixture of olives, tomatoes, olive oil, minced garlic, parsley, green onions and cappers   Meats:  o Herbed greek chicken salad with kalamata olives, cucumber, feta  o Red bell peppers stuffed with spinach, bulgur, lean ground beef (or lentils) & topped with feta   o Kebabs: skewers of chicken, tomatoes, onions, zucchini, squash  o [place] burgers: made with red onions, mint, dill, lemon juice, feta cheese topped with roasted red peppers  Vegetarian o Cucumber salad: cucumbers, artichoke hearts, celery, red onion, feta cheese, tossed in  olive oil & lemon juice  o Hummus and whole grain pita points with a greek salad (lettuce, tomato, feta, olives, cucumbers, red onion) o Lentil soup with celery, carrots made with vegetable broth, garlic, salt and pepper  o Tabouli salad: parsley, bulgur, mint, scallions, cucumbers, tomato, radishes, lemon juice, olive oil, salt and pepper.       [place] [facility] Dietary Recommendations for Weight Loss What to Avoid  Avoid added sugars o Often added sugar can be found in processed foods such as many condiments, dry cereals, cakes, cookies, chips, crisps, crackers, candies, sweetened drinks, etc.  o Read labels and AVOID/DECREASE use of foods with the following in their ingredient list: Sugar, fructose, high fructose corn syrup, sucrose, glucose, maltose, dextrose, molasses, cane sugar, brown sugar, any type of syrup, agave nectar, etc.    Avoid snacking in between meals  Avoid foods made with flour o If you are going to eat food made with flour, choose those made with whole-grains; and, minimize your consumption as much as is tolerable  Avoid processed foods o These foods are generally stocked in the middle of the grocery store. Focus on shopping on the perimeter of the grocery.   Avoid Meat  o We recommend following a plant-based diet at [facility]. Thus, we recommend avoiding meat as a general rule. Consider eating beans, legumes, eggs, and/or dairy products for regular protein sources o If you plan on eating meat limit to 4 ounces of meat at a time and choose lean options such as Fish, chicken, [place]. Avoid red meat intake such as pork and/or steak What to Include  Vegetables o GREEN LEAFY VEGETABLES: Kale, spinach, mustard greens, collard greens, cabbage, broccoli, etc. o OTHER: Asparagus, cauliflower, eggplant, carrots, peas, Brussel sprouts, tomatoes, bell peppers, zucchini, beets, cucumbers, etc.  Grains, seeds, and legumes o Beans: kidney beans, black eyed  peas, garbanzo beans, black beans, pinto beans, etc. o Whole, unrefined grains: brown rice, barley, bulgur, oatmeal, etc.  Healthy fats  o Avoid highly processed fats such as vegetable oil o Examples of healthy fats: avocado, olives, virgin olive oil, dark chocolate (?72% Cocoa), nuts (peanuts, almonds, walnuts, cashews, pecans, etc.)  None to Low Intake of Animal Sources of Protein o Meat sources: chicken, [place], salmon, tuna. Limit to 4 ounces of meat at one time. o Consider limiting dairy sources, but when choosing dairy focus on: PLAIN [place] yogurt, cottage cheese, high-protein milk  Fruit o Choose berries  When to Eat  Intermittent Fasting: o Choosing not to eat for a specific time period, but DO FOCUS ON HYDRATION when fasting o Multiple Techniques: - Time Restricted Eating: eat 3 meals in a day, each meal lasting no more than 60 minutes, no snacks between meals - 16-18 hour fast: fast for 16 to 18 hours up to 7 days a week. Often suggested to start with 2-3 nonconsecutive days per week.   Remember the time you sleep is counted as fasting.   Examples of eating schedule: Fast from 7:00pm-11:00am. Eat between 11:00am-7:00pm.  - 24-hour fast: fast for 24 hours up to every other day. Often suggested to start with 1 day per week  Remember the time you sleep is counted as fasting  Examples of eating schedule:  o Eating day: eat 2-3 meals on your eating day. If doing 2 meals, each meal should last no more than 90 minutes. If doing 3 meals, each meal should last no more than 60 minutes. Finish last meal by 7:00pm. o Fasting day: Fast until 7:00pm.  o IF YOU FEEL UNWELL FOR ANY REASON/IN ANY WAY WHEN FASTING, STOP FASTING BY EATING A NUTRITIOUS SNACK OR LIGHT MEAL o ALWAYS FOCUS ON HYDRATION DURING FASTS - Acceptable Hydration sources: water, broths, tea/coffee (black tea/coffee is best but using a small amount of whole-fat dairy products in coffee/tea is acceptable).  - Poor  Hydration Sources: anything with sugar or artificial sweeteners added to it  These recommendations have been developed for patients that are actively receiving medical care from either Dr. Anastasio Champion or Jeralyn Ruths, DNP, NP-C at [facility]. These recommendations are developed for patients with specific medical conditions and are not meant to be distributed or used by others that are not actively receiving care from either provider listed above at [facility]. It is not appropriate to participate in the above eating plans without proper medical supervision.   Reference: Rexanne Mano. The obesity code. [place]; 2016.

## 2019-09-04 NOTE — Progress Notes (Signed)
Subjective:  Patient ID: Amy Houston, female    DOB: 27-May-1945  Age: 74 y.o. MRN: [id number]  CC:  Chief Complaint  Patient presents with  . Vitamin D Deficiency  . Hypertension  . Diabetes  . Hyperlipidemia  . Other    Family history of thyroid cancer      HPI  This patient arrives today for the above.  Vitamin D deficiency: She does have a history of vitamin D deficiency and was told to increase her vitamin D3 intake to 6000 IUs daily at last office visit.  She tells me she has done this.  She is due for serum check today.  Hypertension: She continues on lisinopril metoprolol for treatment of her hypertension is tolerating well.  Diabetes: Last A1c was 6.8 and this was drawn about 5 months ago.  She is on ACE inhibitor she is not on statin.  She is not on any medications to control her hyperglycemia.  Hyperlipidemia: She does have a history of hyperlipidemia.  Last lipid panel collected approximately 8 months ago showed LDL greater than 100.  Family history of thyroid cancer: She recently found out that her daughter was diagnosed with thyroid cancer.  They think it is papillary, but histology has not yet confirmed the type.  She also has a brother who was diagnosed with thyroid cancer, she is very concerned about her risk for developing thyroid cancer as well.  She is not having any symptoms of overactive thyroid at this time, but would like to have her thyroid hormone levels checked today.  Past Medical History:  Diagnosis Date  . Abnormal genetic test 04/09/2013   PALB2 and CHEK2 positivity   . Arthritis    "of the spine" (05/08/2012)  . Breast cancer ([place]) 1995   "left" (05/08/2012)  . Breast cancer, right breast, IDC, Multifocal 03/12/2012   Multifocal, 10 and 8 left breast   . Diabetes mellitus 1999   "dx; never on meds" (05/08/2012)  . Diabetes mellitus without complication ([place]) 03/07/5730  . Essential hypertension, benign 01/08/2019  . Exertional shortness of  breath   . Family history of thyroid cancer   . GERD (gastroesophageal reflux disease)    tums  . H/O hiatal hernia   . Hemorrhoid   . History of blood transfusion    post child birth  . HTN (hypertension)   . IBS (irritable bowel syndrome)   . Osteopenia 12/11/2012   On Ca++ and Vit D. Bone density in April 2014.  Next bone density due in April 2016.  Marland Kitchen PONV (postoperative nausea and vomiting)   . PPD positive, treated 1999      Family History  Problem Relation Age of Onset  . Lung cancer Mother 82       smoker  . Leukemia Father 42       Hairy Cell at 78, CLL at 39  . Lymphoma Father 64       Hodgkins lymphoma  . Lung cancer Sister 51  . Thyroid cancer Brother 46       Medullary  . Lymphoma Brother 60       Follicular lymphoma  . Breast cancer Maternal Aunt        mother's paternal half sister  . Lung cancer Maternal Uncle        mother's paternal half brother  . Lung cancer Maternal Grandmother        smoker  . Other Daughter  leiomyoma of esophagus  . Colon cancer Maternal Aunt        mother's maternal half sister  . Cancer Paternal Uncle        oral cancer dx in his 18s  . Lung cancer Cousin        paternal cousin  . Breast cancer Cousin        paternal cousin died in her 66s    Social History   Social History Narrative   Married 51 yrs.Lives with wife .Retired.   Social History   Tobacco Use  . Smoking status: Never Smoker  . Smokeless tobacco: Never Used  Substance Use Topics  . Alcohol use: Yes    Comment: 05/08/2012 "margarita or glass of wine once or twice/yr"     Current Meds  Medication Sig  . acetaminophen (TYLENOL) 500 MG tablet Take 1,000 mg by mouth daily as needed for moderate pain or headache.   . benzonatate (TESSALON) 100 MG capsule Take 1 capsule (100 mg total) by mouth every 8 (eight) hours.  . calcium-vitamin D (OSCAL WITH D) 500-200 MG-UNIT tablet Take 1 tablet by mouth daily.   . cetirizine (ZYRTEC ALLERGY) 10 MG  tablet Take 1 tablet (10 mg total) by mouth daily.  . Cholecalciferol (VITAMIN D) 2000 units CAPS Take 2,000 Units by mouth at bedtime.  . cyanocobalamin (KP VITAMIN B-12) 1000 MCG tablet Take 1,000 mcg by mouth in the morning and at bedtime.  . fluticasone (FLONASE) 50 MCG/ACT nasal spray Place 1 spray into both nostrils daily for 14 days.  Marland Kitchen letrozole (FEMARA) 2.5 MG tablet Take 1 tablet (2.5 mg total) by mouth daily.  Marland Kitchen lisinopril (ZESTRIL) 20 MG tablet Take 1 tablet (20 mg total) by mouth daily.  . metoprolol succinate (TOPROL-XL) 25 MG 24 hr tablet TAKE 1 TABLET BY MOUTH ONCE A DAY.  . Multiple Vitamin (MULTIVITAMIN WITH MINERALS) TABS Take 1 tablet by mouth at bedtime.   Marland Kitchen omeprazole (PRILOSEC) 20 MG capsule TAKE ONE CAPSULE BY MOUTH ONCE DAILY.  Marland Kitchen ondansetron (ZOFRAN ODT) 4 MG disintegrating tablet Take 1 tablet (4 mg total) by mouth every 8 (eight) hours as needed for nausea or vomiting.  Marland Kitchen OVER THE COUNTER MEDICATION Take 300 mg by mouth in the morning and at bedtime. [place] Support  . predniSONE (DELTASONE) 10 MG tablet Take 2 tablets (20 mg total) by mouth daily.  Marland Kitchen venlafaxine XR (EFFEXOR-XR) 75 MG 24 hr capsule TAKE (1) CAPSULE BY MOUTH TWICE DAILY.    ROS:  Review of Systems  Constitutional: Negative.   Respiratory: Negative.   Cardiovascular: Negative.   Gastrointestinal: Negative.   Neurological: Negative.      Objective:   Today's Vitals: BP 140/70 (BP Location: Right Arm, Patient Position: Sitting, Cuff Size: Normal)   Pulse 81   Temp (!) 97.3 F (36.3 C) (Temporal)   Ht 4' 10.5" (1.486 m)   Wt 161 lb (73 kg)   SpO2 98%   BMI 33.08 kg/m  Vitals with BMI 09/04/2019 08/21/2019 07/24/2019  Height 4' 10.5" $Remove'4\' 10"'ogaxclD$  4' 10.5"  Weight 161 lbs 160 lbs 161 lbs 6 oz  BMI 33.07 32.20 25.42  Systolic 706 237 628  Diastolic 70 72 60  Pulse 81 71 82     Physical Exam Vitals reviewed.  Constitutional:      General: She is not in acute distress.     Appearance: Normal appearance.  HENT:     Head: Normocephalic and atraumatic.  Neck:  Thyroid: No thyroid mass, thyromegaly or thyroid tenderness.  Cardiovascular:     Rate and Rhythm: Normal rate and regular rhythm.     Pulses: Normal pulses.     Heart sounds: Normal heart sounds.  Pulmonary:     Effort: Pulmonary effort is normal.     Breath sounds: Normal breath sounds.  Skin:    General: Skin is warm and dry.  Neurological:     General: No focal deficit present.     Mental Status: She is alert and oriented to person, place, and time.  Psychiatric:        Mood and Affect: Mood normal.        Behavior: Behavior normal.        Judgment: Judgment normal.          Assessment and Plan   1. Diabetes mellitus without complication ([place])   2. Essential hypertension, benign   3. Vitamin D deficiency disease   4. Hyperlipidemia, unspecified hyperlipidemia type   5. Family history of thyroid cancer   6. Class 1 obesity with serious comorbidity and body mass index (BMI) of 33.0 to 33.9 in adult, unspecified obesity type      Plan: 1., 4.  We will check A1c and urine for albuminuria today.  I did discuss the role statin therapy plays in an individual with type 2 diabetes.  She would like to hold off on statin therapy for now but would be willing to have lipid panel rechecked today.  She tells me she was fasting this morning.  Thus we will also add lipid panel to blood work today. 2.  She will continue on her chronic medications for hypertension. 3.  We will check serum level of vitamin D today for further evaluation. 5.  No thyroid nodules noted on exam today.  We will check thyroid panel for further evaluation.  May need to consider ultrasound of her thyroid in the future if she is concerned about thyroid cancer to rule out any masses. 6.  She is interested in losing weight and wants to discuss this today as well.  I did recommend that she follow a plant-based, whole food diet  or also consider the Mediterranean diet.  I will give her additional education on this with her handouts today.  We also discussed about intermittent fasting and the role it plays in weight loss.  She is interested in trying a 16-hour fast a few days a week.  She was encouraged to focus on hydration when fasting and to break her fast if she feels unwell.  Handout will be given to her today about fasting in addition to diet.  Tests ordered Orders Placed This Encounter  Procedures  . Vitamin D, 25-hydroxy  . CMP with eGFR([facility])  . Hemoglobin A1c  . Microalbumin/Creatinine Ratio, Urine  . Lipid Panel  . TSH  . T3, Free  . T4, Free      No orders of the defined types were placed in this encounter.   Patient to follow-up in 3 to 4 weeks or sooner as needed.  Ailene Ards, NP

## 2019-09-05 LAB — COMPLETE METABOLIC PANEL WITH GFR
AG Ratio: 1.7 (calc) (ref 1.0–2.5)
ALT: 35 U/L — ABNORMAL HIGH (ref 6–29)
AST: 31 U/L (ref 10–35)
Albumin: 4.4 g/dL (ref 3.6–5.1)
Alkaline phosphatase (APISO): 74 U/L (ref 37–153)
BUN: 14 mg/dL (ref 7–25)
CO2: 24 mmol/L (ref 20–32)
Calcium: 8.9 mg/dL (ref 8.6–10.4)
Chloride: 106 mmol/L (ref 98–110)
Creat: 0.79 mg/dL (ref 0.60–0.93)
GFR, Est African American: 86 mL/min/{1.73_m2} (ref >=60)
GFR, Est Non African American: 74 mL/min/{1.73_m2} (ref >=60)
Globulin: 2.6 g/dL (calc) (ref 1.9–3.7)
Glucose, Bld: 125 mg/dL — ABNORMAL HIGH (ref 65–99)
Potassium: 4.5 mmol/L (ref 3.5–5.3)
Sodium: 141 mmol/L (ref 135–146)
Total Bilirubin: 0.6 mg/dL (ref 0.2–1.2)
Total Protein: 7 g/dL (ref 6.1–8.1)

## 2019-09-05 LAB — HEMOGLOBIN A1C
Hgb A1c MFr Bld: 6.7 % of total Hgb — ABNORMAL HIGH (ref <5.7)
Mean Plasma Glucose: 146 (calc)
eAG (mmol/L): 8.1 (calc)

## 2019-09-05 LAB — MICROALBUMIN / CREATININE URINE RATIO
Creatinine, Urine: 156 mg/dL (ref 20–275)
Microalb Creat Ratio: 12 mcg/mg creat (ref <30)
Microalb, Ur: 1.8 mg/dL

## 2019-09-05 LAB — LIPID PANEL
Cholesterol: 206 mg/dL — ABNORMAL HIGH (ref <200)
HDL: 45 mg/dL — ABNORMAL LOW (ref >=50)
LDL Cholesterol (Calc): 130 mg/dL (calc) — ABNORMAL HIGH
Non-HDL Cholesterol (Calc): 161 mg/dL (calc) — ABNORMAL HIGH (ref <130)
Total CHOL/HDL Ratio: 4.6 (calc) (ref <5.0)
Triglycerides: 175 mg/dL — ABNORMAL HIGH (ref <150)

## 2019-09-05 LAB — VITAMIN D 25 HYDROXY (VIT D DEFICIENCY, FRACTURES): Vit D, 25-Hydroxy: 32 ng/mL (ref 30–100)

## 2019-09-05 LAB — TSH: TSH: 1.12 mIU/L (ref 0.40–4.50)

## 2019-09-05 LAB — T4, FREE: Free T4: 1 ng/dL (ref 0.8–1.8)

## 2019-09-05 LAB — T3, FREE: T3, Free: 2.7 pg/mL (ref 2.3–4.2)

## 2019-10-01 ENCOUNTER — Encounter (INDEPENDENT_AMBULATORY_CARE_PROVIDER_SITE_OTHER): Payer: Self-pay | Admitting: Nurse Practitioner

## 2019-10-01 ENCOUNTER — Ambulatory Visit (INDEPENDENT_AMBULATORY_CARE_PROVIDER_SITE_OTHER): Payer: PPO | Admitting: Nurse Practitioner

## 2019-10-01 ENCOUNTER — Other Ambulatory Visit: Payer: Self-pay

## 2019-10-01 VITALS — BP 140/72 | HR 75 | Temp 97.3°F | Resp 18 | Ht <= 58 in | Wt 165.0 lb

## 2019-10-01 DIAGNOSIS — E785 Hyperlipidemia, unspecified: Secondary | ICD-10-CM

## 2019-10-01 DIAGNOSIS — E669 Obesity, unspecified: Secondary | ICD-10-CM

## 2019-10-01 DIAGNOSIS — Z23 Encounter for immunization: Secondary | ICD-10-CM | POA: Diagnosis not present

## 2019-10-01 DIAGNOSIS — E119 Type 2 diabetes mellitus without complications: Secondary | ICD-10-CM | POA: Diagnosis not present

## 2019-10-01 DIAGNOSIS — Z6833 Body mass index (BMI) 33.0-33.9, adult: Secondary | ICD-10-CM

## 2019-10-01 NOTE — Progress Notes (Signed)
Subjective:  Patient ID: Amy Houston, female    DOB: June 08, 1945  Age: 74 y.o. MRN: [id number]  CC:  Chief Complaint  Patient presents with  . Follow-up  . Flu Vaccine  . Obesity  . Hyperlipidemia  . Diabetes      HPI  This patient arrives today for the above.  She is requesting flu vaccine today.  We have been discussing lifestyle changes to help her with weight loss.  We did discuss Mediterranean diet intermittent fasting last office visit.  She tells me she has not made any changes to her diet between now and then.  She does have a history of hyperlipidemia and we did collect lipid panel last office visit.  LDL was 130.  In addition she is a type II diabetic and is controlled with diet alone currently.  She is not on statin therapy.  She is on ACE inhibitor.  Urine checked for albuminuria at last office visit and was negative.  Past Medical History:  Diagnosis Date  . Abnormal genetic test 04/09/2013   PALB2 and CHEK2 positivity   . Arthritis    "of the spine" (05/08/2012)  . Breast cancer ([place] 1995   "left" (05/08/2012)  . Breast cancer, right breast, IDC, Multifocal 03/12/2012   Multifocal, 10 and 8 left breast   . Diabetes mellitus 1999   "dx; never on meds" (05/08/2012)  . Diabetes mellitus without complication ([place]) 04/06/8183  . Essential hypertension, benign 01/08/2019  . Exertional shortness of breath   . Family history of thyroid cancer   . GERD (gastroesophageal reflux disease)    tums  . H/O hiatal hernia   . Hemorrhoid   . History of blood transfusion    post child birth  . HTN (hypertension)   . IBS (irritable bowel syndrome)   . Osteopenia 12/11/2012   On Ca++ and Vit D. Bone density in April 2014.  Next bone density due in April 2016.  Marland Kitchen PONV (postoperative nausea and vomiting)   . PPD positive, treated 1999      Family History  Problem Relation Age of Onset  . Lung cancer Mother 19       smoker  . Leukemia Father 72       Hairy Cell at  24, CLL at 74  . Lymphoma Father 68       Hodgkins lymphoma  . Lung cancer Sister 99  . Thyroid cancer Brother 46       Medullary  . Lymphoma Brother 60       Follicular lymphoma  . Breast cancer Maternal Aunt        mother's paternal half sister  . Lung cancer Maternal Uncle        mother's paternal half brother  . Lung cancer Maternal Grandmother        smoker  . Other Daughter        leiomyoma of esophagus  . Colon cancer Maternal Aunt        mother's maternal half sister  . Cancer Paternal Uncle        oral cancer dx in his 20s  . Lung cancer Cousin        paternal cousin  . Breast cancer Cousin        paternal cousin died in her 27s    Social History   Social History Narrative   Married 36 yrs.Lives with wife .Retired.   Social History   Tobacco Use  . Smoking  status: Never Smoker  . Smokeless tobacco: Never Used  Substance Use Topics  . Alcohol use: Yes    Comment: 05/08/2012 "margarita or glass of wine once or twice/yr"     Current Meds  Medication Sig  . acetaminophen (TYLENOL) 500 MG tablet Take 1,000 mg by mouth daily as needed for moderate pain or headache.   . benzonatate (TESSALON) 100 MG capsule Take 1 capsule (100 mg total) by mouth every 8 (eight) hours.  . calcium-vitamin D (OSCAL WITH D) 500-200 MG-UNIT tablet Take 1 tablet by mouth daily.   . cetirizine (ZYRTEC ALLERGY) 10 MG tablet Take 1 tablet (10 mg total) by mouth daily.  . Cholecalciferol (VITAMIN D) 2000 units CAPS Take 6,000 Units by mouth at bedtime.   . cyanocobalamin (KP VITAMIN B-12) 1000 MCG tablet Take 1,000 mcg by mouth in the morning and at bedtime.  Marland Kitchen letrozole (FEMARA) 2.5 MG tablet Take 1 tablet (2.5 mg total) by mouth daily.  Marland Kitchen lisinopril (ZESTRIL) 20 MG tablet Take 1 tablet (20 mg total) by mouth daily.  . metoprolol succinate (TOPROL-XL) 25 MG 24 hr tablet TAKE 1 TABLET BY MOUTH ONCE A DAY.  . Multiple Vitamin (MULTIVITAMIN WITH MINERALS) TABS Take 1 tablet by mouth at  bedtime.   Marland Kitchen omeprazole (PRILOSEC) 20 MG capsule TAKE ONE CAPSULE BY MOUTH ONCE DAILY.  Marland Kitchen ondansetron (ZOFRAN ODT) 4 MG disintegrating tablet Take 1 tablet (4 mg total) by mouth every 8 (eight) hours as needed for nausea or vomiting.  Marland Kitchen OVER THE COUNTER MEDICATION Take 300 mg by mouth in the morning and at bedtime. [place] Support  . predniSONE (DELTASONE) 10 MG tablet Take 2 tablets (20 mg total) by mouth daily.  Marland Kitchen venlafaxine XR (EFFEXOR-XR) 75 MG 24 hr capsule TAKE (1) CAPSULE BY MOUTH TWICE DAILY.    ROS:  ROS   Objective:   Today's Vitals: BP 140/72 (BP Location: Right Arm, Patient Position: Sitting, Cuff Size: Normal)   Pulse 75   Temp (!) 97.3 F (36.3 C) (Temporal)   Resp 18   Ht 4' 10"  (1.473 m)   Wt 165 lb (74.8 kg)   SpO2 97%   BMI 34.49 kg/m  Vitals with BMI 10/01/2019 09/04/2019 08/21/2019  Height 4' 10"  4' 10.5" 4' 10"   Weight 165 lbs 161 lbs 160 lbs  BMI 34.49 44.01 02.72  Systolic 536 644 034  Diastolic 72 70 72  Pulse 75 81 71     Physical Exam Vitals reviewed.  Constitutional:      General: She is not in acute distress.    Appearance: Normal appearance.  HENT:     Head: Normocephalic and atraumatic.  Neck:     Vascular: No carotid bruit.  Cardiovascular:     Rate and Rhythm: Normal rate and regular rhythm.     Pulses: Normal pulses.     Heart sounds: Normal heart sounds.  Pulmonary:     Effort: Pulmonary effort is normal.     Breath sounds: Normal breath sounds.  Skin:    General: Skin is warm and dry.  Neurological:     General: No focal deficit present.     Mental Status: She is alert and oriented to person, place, and time.  Psychiatric:        Mood and Affect: Mood normal.        Behavior: Behavior normal.        Judgment: Judgment normal.  Assessment and Plan   1. Class 1 obesity with serious comorbidity and body mass index (BMI) of 33.0 to 33.9 in adult, unspecified obesity type   2. Flu vaccine need     3. Hyperlipidemia, unspecified hyperlipidemia type   4. Diabetes mellitus without complication ([place])      Plan: 1.  I encouraged her to continue trying to follow a Mediterranean or plant-based diet.  I also encouraged her to continue trying intermittent fasting.  We talked about making small changes initially, she tells me she will continue to try. 2.  Flu vaccine will be administered today. 3.  We did discuss statin therapy and the fact that with her history of diabetes she is at increased risk for heart attack or stroke.  I also told her that her goal LDL would probably be less than 70.  She would like to hold off on initiating statin therapy and would like to recheck lipid panel in near future. 4.  She will continue to try to eat a healthy diet and will be due for A1c check at next office visit.   Tests ordered No orders of the defined types were placed in this encounter.     No orders of the defined types were placed in this encounter.   Patient to follow-up in 3 months or sooner as needed  Ailene Ards, NP

## 2019-11-13 ENCOUNTER — Other Ambulatory Visit (INDEPENDENT_AMBULATORY_CARE_PROVIDER_SITE_OTHER): Payer: Self-pay | Admitting: Nurse Practitioner

## 2019-11-13 ENCOUNTER — Telehealth (INDEPENDENT_AMBULATORY_CARE_PROVIDER_SITE_OTHER): Payer: Self-pay

## 2019-11-13 ENCOUNTER — Ambulatory Visit: Payer: PPO | Attending: Internal Medicine

## 2019-11-13 DIAGNOSIS — Z9013 Acquired absence of bilateral breasts and nipples: Secondary | ICD-10-CM

## 2019-11-13 DIAGNOSIS — Z853 Personal history of malignant neoplasm of breast: Secondary | ICD-10-CM

## 2019-11-13 DIAGNOSIS — Z23 Encounter for immunization: Secondary | ICD-10-CM

## 2019-11-13 NOTE — Progress Notes (Signed)
   Covid-19 Vaccination Clinic  Name:  Amy Houston    MRN: [id number] DOB: 1945-07-20  11/13/2019  Amy Houston was observed post Covid-19 immunization for 15 minutes without incident. She was provided with Vaccine Information Sheet and instruction to access the V-Safe system.   Amy Houston was instructed to call 911 with any severe reactions post vaccine: Marland Kitchen Difficulty breathing  . Swelling of face and throat  . A fast heartbeat  . A bad rash all over body  . Dizziness and weakness

## 2019-11-13 NOTE — Telephone Encounter (Signed)
Patient called and left a detailed VM to let us know that she needs her Annual Rx sent to [facility] for her Bilateral Prosthesis that Dr. Luan Pulling used to send in for her?  Patient can be reached at [phone].

## 2019-11-13 NOTE — Telephone Encounter (Signed)
Faxed the order to [facility] at [phone] and also the [facility] fax line at [phone] and neither of these numbers will go through. When I typed the [phone] number, this number was redirected to [phone].  I called patient and let her know that this is ready for pick up and she stated that she is in town and will come by to get the order. Patient verbalized an understanding.

## 2019-11-13 NOTE — Telephone Encounter (Signed)
Megan, please call this patient and let her know I have written the prescription. We will fax the prescription. Verify with Santiago Glad that we can fax the order to the medical equipment side to [facility]. If we can't fax it then patient just needs to be notified to come pick the prescription up.   Santiago Glad, I will give you the signed order to fax to [facility]. If we can't patient just needs to know to come pick it up.

## 2019-11-14 ENCOUNTER — Other Ambulatory Visit (INDEPENDENT_AMBULATORY_CARE_PROVIDER_SITE_OTHER): Payer: Self-pay | Admitting: Internal Medicine

## 2019-11-22 ENCOUNTER — Other Ambulatory Visit (INDEPENDENT_AMBULATORY_CARE_PROVIDER_SITE_OTHER): Payer: Self-pay | Admitting: Internal Medicine

## 2019-11-22 ENCOUNTER — Other Ambulatory Visit: Payer: Self-pay | Admitting: Gastroenterology

## 2019-12-01 ENCOUNTER — Telehealth (INDEPENDENT_AMBULATORY_CARE_PROVIDER_SITE_OTHER): Payer: Self-pay

## 2019-12-01 MED ORDER — OMEPRAZOLE 20 MG PO CPDR: 20.0000 mg | DELAYED_RELEASE_CAPSULE | Freq: Every day | ORAL | 0 refills | Status: DC

## 2019-12-01 NOTE — Telephone Encounter (Signed)
Called patient and let her know that this request has been sent to the pharmacy for her. Patient verbalized an understanding.

## 2019-12-01 NOTE — Telephone Encounter (Signed)
Patient called and left a detailed voice message requesting a refill of the following medication:  omeprazole (PRILOSEC) 20 MG capsule Last filled by Neil Crouch PA-C 07/08/2019, # 30 with 2 refills  Patient would like to have sent to [facility]. Please let me know if you can fill this. Patient would like a call back at [phone].

## 2019-12-01 NOTE — Telephone Encounter (Signed)
I have sent a refill to [facility].  Thank you.

## 2019-12-05 DIAGNOSIS — E119 Type 2 diabetes mellitus without complications: Secondary | ICD-10-CM | POA: Diagnosis not present

## 2019-12-05 LAB — HM DIABETES EYE EXAM

## 2019-12-09 ENCOUNTER — Telehealth (INDEPENDENT_AMBULATORY_CARE_PROVIDER_SITE_OTHER): Payer: PPO | Admitting: Nurse Practitioner

## 2019-12-09 ENCOUNTER — Other Ambulatory Visit: Payer: Self-pay

## 2019-12-09 ENCOUNTER — Encounter (INDEPENDENT_AMBULATORY_CARE_PROVIDER_SITE_OTHER): Payer: Self-pay | Admitting: Nurse Practitioner

## 2019-12-09 ENCOUNTER — Other Ambulatory Visit: Payer: PPO

## 2019-12-09 VITALS — Temp 99.6°F | Ht <= 58 in

## 2019-12-09 DIAGNOSIS — Z20822 Contact with and (suspected) exposure to covid-19: Secondary | ICD-10-CM | POA: Diagnosis not present

## 2019-12-09 DIAGNOSIS — R059 Cough, unspecified: Secondary | ICD-10-CM

## 2019-12-09 NOTE — Progress Notes (Signed)
Due to national recommendations of social distancing related to the [place] pandemic, an audio-only tele-health visit was felt to be the most appropriate encounter type for this patient today. I connected with  Amy Houston on 12/09/19 utilizing audio-only technology and verified that I am speaking with the correct person using two identifiers. The patient was located at their home, and I was located at the office of [facility] during the encounter. I discussed the limitations of evaluation and management by telemedicine. The patient expressed understanding and agreed to proceed.    Subjective:  Patient ID: Amy Houston, female    DOB: 1945-01-31  Age: 74 y.o. MRN: [id number]  CC:  Chief Complaint  Patient presents with  . Headache    congestion, headache, runny nose, low grade feeling, aching all over, chest pain and back pain, started over the weekend, husband had this first      HPI  This patient arrives today for virtual visit for the above.  The last 3 to 4 days the patient has been experiencing the above symptoms and a mostly dry cough.  She has been fully vaccinated against COVID-19 and has received her booster approximate 1 month ago.  She does not know of any exposures to COVID-19, she mentions that her husband had similar symptoms before she got them but he has not been tested for COVID-19.  She also has not yet been tested for COVID-19.  She has been taking Tylenol, and Robitussin as needed for symptoms with mild to moderate improvement.  She tells me that she is not experiencing shortness of breath, but that her symptoms are similar to upper respiratory/sinus infections that she has around this time every year.  She tells me in the past taking a Z-Pak has resulted in improvement in her symptoms.  Past Medical History:  Diagnosis Date  . Abnormal genetic test 04/09/2013   PALB2 and CHEK2 positivity   . Arthritis    "of the spine" (05/08/2012)  . Breast cancer  ([place]) 1995   "left" (05/08/2012)  . Breast cancer, right breast, IDC, Multifocal 03/12/2012   Multifocal, 10 and 8 left breast   . Diabetes mellitus 1999   "dx; never on meds" (05/08/2012)  . Diabetes mellitus without complication ([place]) 4/0/9811  . Essential hypertension, benign 01/08/2019  . Exertional shortness of breath   . Family history of thyroid cancer   . GERD (gastroesophageal reflux disease)    tums  . H/O hiatal hernia   . Hemorrhoid   . History of blood transfusion    post child birth  . HTN (hypertension)   . IBS (irritable bowel syndrome)   . Osteopenia 12/11/2012   On Ca++ and Vit D. Bone density in April 2014.  Next bone density due in April 2016.  Marland Kitchen PONV (postoperative nausea and vomiting)   . PPD positive, treated 1999      Family History  Problem Relation Age of Onset  . Lung cancer Mother 59       smoker  . Leukemia Father 23       Hairy Cell at 1, CLL at 69  . Lymphoma Father 90       Hodgkins lymphoma  . Lung cancer Sister 79  . Thyroid cancer Brother 46       Medullary  . Lymphoma Brother 60       Follicular lymphoma  . Breast cancer Maternal Aunt        mother's paternal half sister  .  Lung cancer Maternal Uncle        mother's paternal half brother  . Lung cancer Maternal Grandmother        smoker  . Other Daughter        leiomyoma of esophagus  . Colon cancer Maternal Aunt        mother's maternal half sister  . Cancer Paternal Uncle        oral cancer dx in his 39s  . Lung cancer Cousin        paternal cousin  . Breast cancer Cousin        paternal cousin died in her 87s    Social History   Social History Narrative   Married 58 yrs.Lives with wife .Retired.   Social History   Tobacco Use  . Smoking status: Never Smoker  . Smokeless tobacco: Never Used  Substance Use Topics  . Alcohol use: Yes    Comment: 05/08/2012 "margarita or glass of wine once or twice/yr"     Current Meds  Medication Sig  . acetaminophen (TYLENOL) 500  MG tablet Take 1,000 mg by mouth daily as needed for moderate pain or headache.   . calcium-vitamin D (OSCAL WITH D) 500-200 MG-UNIT tablet Take 1 tablet by mouth daily.   . cetirizine (ZYRTEC ALLERGY) 10 MG tablet Take 1 tablet (10 mg total) by mouth daily.  . Cholecalciferol (VITAMIN D) 2000 units CAPS Take 6,000 Units by mouth at bedtime.   . cyanocobalamin (KP VITAMIN B-12) 1000 MCG tablet Take 1,000 mcg by mouth in the morning and at bedtime.  Marland Kitchen letrozole (FEMARA) 2.5 MG tablet Take 1 tablet (2.5 mg total) by mouth daily.  Marland Kitchen lisinopril (ZESTRIL) 20 MG tablet TAKE ONE TABLET BY MOUTH ONCE DAILY.  . metoprolol succinate (TOPROL-XL) 25 MG 24 hr tablet TAKE 1 TABLET BY MOUTH ONCE A DAY.  . Multiple Vitamin (MULTIVITAMIN WITH MINERALS) TABS Take 1 tablet by mouth at bedtime.   Marland Kitchen omeprazole (PRILOSEC) 20 MG capsule Take 1 capsule (20 mg total) by mouth daily.  . ondansetron (ZOFRAN ODT) 4 MG disintegrating tablet Take 1 tablet (4 mg total) by mouth every 8 (eight) hours as needed for nausea or vomiting.  Marland Kitchen OVER THE COUNTER MEDICATION Take 300 mg by mouth in the morning and at bedtime. [place] Support  . venlafaxine XR (EFFEXOR-XR) 75 MG 24 hr capsule TAKE (1) CAPSULE BY MOUTH TWICE DAILY.    ROS:  Review of Systems  Constitutional: Positive for fever and malaise/fatigue.  Respiratory: Positive for cough and sputum production (scant). Negative for shortness of breath.   Cardiovascular: Positive for chest pain.  Musculoskeletal: Positive for back pain and myalgias.     Objective:   Today's Vitals: Temp 99.6 F (37.6 C) (Temporal)   Ht $R'4\' 10"'sv$  (1.473 m)   BMI 34.49 kg/m  Vitals with BMI 12/09/2019 10/01/2019 09/04/2019  Height $Remov'4\' 10"'KUWXlb$  $RemoveB'4\' 10"'ABkzKwRp$  4' 10.5"  Weight - 165 lbs 161 lbs  BMI - 16.10 96.04  Systolic - 540 981  Diastolic - 72 70  Pulse - 75 81     Physical Exam Comprehensive physical exam not conducted today as office visit was conducted over the phone.  She did  sound fairly well on the phone, she did cough a couple of times.  She did not sound short of breath and was able to complete full sentences without having to take a break to breathe.  She was alert and oriented and appeared to have  appropriate thought processes and judgment.      Assessment and Plan   1. Cough      Plan: 1.  I did discuss that because the pandemic is still quite active and number of cases are starting to rise that I would recommend she be tested for COVID-19.  I did provide her with a phone number that she can call to get scheduled for COVID-19 testing via [facility].  She tells me she will go ahead and try to get tested.  She was encouraged to continue using her Robitussin and Tylenol as needed.  I offered Tessalon Perles for cough suppression but she declined to have that prescribed at this time.  We did discuss that if her symptoms worsen that she should notify me and if they worsen outside of business hours that she should proceed to the emergency department.  I specifically warned her about worsening shortness of breath.  She tells me she understands.  She was told to let me know once her COVID-19 results have come back.   Tests ordered No orders of the defined types were placed in this encounter.     No orders of the defined types were placed in this encounter.   Patient to follow-up as scheduled or sooner as needed.  I will try to monitor her chart to see if her COVID-19 testing comes back positive or negative.  This conversation lasted for approximately 10 minutes and 20 seconds.  Ailene Ards, NP

## 2019-12-10 LAB — SARS-COV-2, NAA 2 DAY TAT

## 2019-12-10 LAB — NOVEL CORONAVIRUS, NAA: SARS-CoV-2, NAA: NOT DETECTED

## 2019-12-10 LAB — SPECIMEN STATUS REPORT

## 2019-12-11 ENCOUNTER — Other Ambulatory Visit (INDEPENDENT_AMBULATORY_CARE_PROVIDER_SITE_OTHER): Payer: Self-pay | Admitting: Nurse Practitioner

## 2019-12-11 DIAGNOSIS — R059 Cough, unspecified: Secondary | ICD-10-CM

## 2019-12-11 MED ORDER — AZITHROMYCIN 250 MG PO TABS: ORAL_TABLET | ORAL | 0 refills | Status: DC

## 2019-12-11 NOTE — Progress Notes (Signed)
I was able to see that this patient was tested for COVID-19.  It did come back negative.  At this point her symptoms started 5 to 6 days ago.  Today, she tells me that her symptoms are a bit better.  She does feel that she would improve with an antibiotic.  I did educate her that the vast majority of upper respiratory infections are viral in origin and that antibiotics will not necessarily improve her symptoms or make her feel better quicker.  Because we are going into the weekend and she does have risk factors for developing severe disease (age, diabetes),  I will prescribe a course of azithromycin if her symptoms worsen or persist into early next week.  I did highly encourage her to hold off on taking the antibiotic unless her symptoms worsen or persist into next week.  I did discuss the rationale behind this recommendation.  Specifically that all medications including antibiotics have risks, and that azithromycin can have negative effects on heart rhythm.  I also discussed risk of antibacterial resistance development when taking unnecessary antibiotics.  She tells me she understands and will try to hold off unless her symptoms worsen or persist.

## 2019-12-13 DIAGNOSIS — C50011 Malignant neoplasm of nipple and areola, right female breast: Secondary | ICD-10-CM | POA: Diagnosis not present

## 2019-12-13 DIAGNOSIS — C50012 Malignant neoplasm of nipple and areola, left female breast: Secondary | ICD-10-CM | POA: Diagnosis not present

## 2019-12-15 ENCOUNTER — Telehealth (INDEPENDENT_AMBULATORY_CARE_PROVIDER_SITE_OTHER): Payer: Self-pay

## 2019-12-15 ENCOUNTER — Other Ambulatory Visit (INDEPENDENT_AMBULATORY_CARE_PROVIDER_SITE_OTHER): Payer: Self-pay | Admitting: Internal Medicine

## 2019-12-15 ENCOUNTER — Ambulatory Visit (INDEPENDENT_AMBULATORY_CARE_PROVIDER_SITE_OTHER): Payer: PPO

## 2019-12-15 ENCOUNTER — Ambulatory Visit
Admission: EM | Admit: 2019-12-15 | Discharge: 2019-12-15 | Disposition: A | Discharge status: Home / Self Care | Payer: PPO | Attending: Emergency Medicine | Admitting: Emergency Medicine

## 2019-12-15 ENCOUNTER — Other Ambulatory Visit: Payer: Self-pay

## 2019-12-15 DIAGNOSIS — R059 Cough, unspecified: Secondary | ICD-10-CM | POA: Diagnosis not present

## 2019-12-15 DIAGNOSIS — J208 Acute bronchitis due to other specified organisms: Secondary | ICD-10-CM | POA: Diagnosis not present

## 2019-12-15 DIAGNOSIS — R058 Other specified cough: Secondary | ICD-10-CM | POA: Diagnosis not present

## 2019-12-15 MED ORDER — BENZONATATE 100 MG PO CAPS: 100.0000 mg | ORAL_CAPSULE | Freq: Three times a day (TID) | ORAL | 0 refills | Status: DC | PRN

## 2019-12-15 MED ORDER — PREDNISONE 20 MG PO TABS: 40.0000 mg | ORAL_TABLET | Freq: Every day | ORAL | 1 refills | Status: DC

## 2019-12-15 MED ORDER — AZITHROMYCIN 250 MG PO TABS: ORAL_TABLET | ORAL | 0 refills | Status: DC

## 2019-12-15 NOTE — ED Provider Notes (Addendum)
[place]   [id number] 12/15/19 Arrival Time: 6160  Chief Complaint  Patient presents with  . Cough     SUBJECTIVE: History from: patient and family.  Amy Houston is a 74 y.o. female who presented to the urgent care with a complaint of cough for the past 2 weeks.  Has tried Z-Pak without relief or no symptom improvement.  Denies sick exposure to COVID, flu or strep.  Denies recent travel.  Denies alleviating or aggravating factors.  Denies previous symptoms in the past.   Denies fever, chills, fatigue, sinus pain, rhinorrhea, sore throat, SOB, wheezing, chest pain, nausea, changes in bowel or bladder habits.     ROS: As per HPI.  All other pertinent ROS negative.      Past Medical History:  Diagnosis Date  . Abnormal genetic test 04/09/2013   PALB2 and CHEK2 positivity   . Arthritis    "of the spine" (05/08/2012)  . Breast cancer ([place]) 1995   "left" (05/08/2012)  . Breast cancer, right breast, IDC, Multifocal 03/12/2012   Multifocal, 10 and 8 left breast   . Diabetes mellitus 1999   "dx; never on meds" (05/08/2012)  . Diabetes mellitus without complication ([place]) 07/04/7104  . Essential hypertension, benign 01/08/2019  . Exertional shortness of breath   . Family history of thyroid cancer   . GERD (gastroesophageal reflux disease)    tums  . H/O hiatal hernia   . Hemorrhoid   . History of blood transfusion    post child birth  . HTN (hypertension)   . IBS (irritable bowel syndrome)   . Osteopenia 12/11/2012   On Ca++ and Vit D. Bone density in April 2014.  Next bone density due in April 2016.  Marland Kitchen PONV (postoperative nausea and vomiting)   . PPD positive, treated 1999   Past Surgical History:  Procedure Laterality Date  . ABDOMINAL HYSTERECTOMY Bilateral 01/30/2013   Procedure: HYSTERECTOMY ABDOMINAL;  Surgeon: Melina Schools, MD;  Location: [facility];  Service: Gynecology;  Laterality: Bilateral;  . BREAST BIOPSY Left 1995  . BREAST BIOPSY Bilateral 03/2012    "one on the left; 2 on the right" (05/08/2012)  . BREAST LUMPECTOMY Left 1995  . CHOLECYSTECTOMY  1992  . COLONOSCOPY  12/06/93   Dr. Hayes:single diminutive polyp of the descending colon/extrernal hemorrhoids, benign path  . COLONOSCOPY  11/30/98   Dr Hayes:small rectal polyp/internal hemorrhoids, benign path  . COLONOSCOPY  11/26/2006   Dr. Hayes:sigmoid polyp/small interal hemorrhoids, adenomatous  . COLONOSCOPY N/A 03/30/2016   Dr. Oneida Alar: normal TI, 4 mm rectal polyp, multiple small and large-mouthed diverticula in rectosigmoid and sigmoid colon, non-bleeding internal hemorrhoids, tubular adenoma on path. surveillance in 5 yeras  . COLONOSCOPY WITH ESOPHAGOGASTRODUODENOSCOPY (EGD) N/A 07/01/2012   Dr. Oneida Alar: 2 small adenomas, sigmoid colon diverticula, surveillance in 2019  . DILATION AND CURETTAGE OF UTERUS  1968  . ESOPHAGEAL DILATION  07/01/2012   Dr. Oneida Alar: proximal esophageal web, Schatzki's ring at GE junction, s/p dilaiton. Mild non-erosive gastritis. Repeat EGD July 23, 2012.   . ESOPHAGOGASTRODUODENOSCOPY  1994   Dr. Cristina Gong: small hiatal hernia and Schatzki's ring widely patent, CLO test negative  . ESOPHAGOGASTRODUODENOSCOPY (EGD) WITH ESOPHAGEAL DILATION N/A 07/23/2012   Dr. Fields:proximal esophageal web v. radiation induced stricture/schatzki ring/small HH, s/p dilation  . MASTECTOMY COMPLETE / SIMPLE Left 05/08/2012  . MASTECTOMY COMPLETE / SIMPLE W/ SENTINEL NODE BIOPSY Right 05/08/2012  . SALPINGOOPHORECTOMY Bilateral 01/30/2013   Procedure: SALPINGO OOPHORECTOMY;  Surgeon: Lucille Passy  Ulanda Edison, MD;  Location: [facility];  Service: Gynecology;  Laterality: Bilateral;  . SIMPLE MASTECTOMY WITH AXILLARY SENTINEL NODE BIOPSY Right 05/08/2012   Procedure: RIGHT TOTAL MASTECTOMY WITH AXILLARY SENTINEL NODE BIOPSY;  Surgeon: Haywood Lasso, MD;  Location: [facility];  Service: General;  Laterality: Right;  . SIMPLE MASTECTOMY WITH AXILLARY SENTINEL NODE BIOPSY Left 05/08/2012   Procedure: LEFT  TOTAL MASTECTOMY;  Surgeon: Haywood Lasso, MD;  Location: [facility];  Service: General;  Laterality: Left;  . TONSILLECTOMY AND ADENOIDECTOMY  ~ 2008  . TUBAL LIGATION  1976  . UVULOPALATOPHARYNGOPLASTY, TONSILLECTOMY AND SEPTOPLASTY  ~ 2008   Allergies  Allergen Reactions  . Penicillins Rash    Has patient had a PCN reaction causing immediate rash, facial/tongue/throat swelling, SOB or lightheadedness with hypotension: Yes Has patient had a PCN reaction causing severe rash involving mucus membranes or skin necrosis: No Has patient had a PCN reaction that required hospitalization No Has patient had a PCN reaction occurring within the last 10 years: No If all of the above answers are "NO", then may proceed with Cephalosporin use.    No current facility-administered medications on file prior to encounter.   Current Outpatient Medications on File Prior to Encounter  Medication Sig Dispense Refill  . acetaminophen (TYLENOL) 500 MG tablet Take 1,000 mg by mouth daily as needed for moderate pain or headache.     Marland Kitchen azithromycin (ZITHROMAX) 250 MG tablet Take 2 tablets by mouth on day 1, then take 1 tablet by mouth every 24 hours until you have taken all pills. 6 tablet 0  . calcium-vitamin D (OSCAL WITH D) 500-200 MG-UNIT tablet Take 1 tablet by mouth daily.     . cetirizine (ZYRTEC ALLERGY) 10 MG tablet Take 1 tablet (10 mg total) by mouth daily. 30 tablet 0  . Cholecalciferol (VITAMIN D) 2000 units CAPS Take 6,000 Units by mouth at bedtime.     . cyanocobalamin (KP VITAMIN B-12) 1000 MCG tablet Take 1,000 mcg by mouth in the morning and at bedtime.    . fluticasone (FLONASE) 50 MCG/ACT nasal spray Place 1 spray into both nostrils daily for 14 days. 16 g 0  . letrozole (FEMARA) 2.5 MG tablet Take 1 tablet (2.5 mg total) by mouth daily. 30 tablet 6  . lisinopril (ZESTRIL) 20 MG tablet TAKE ONE TABLET BY MOUTH ONCE DAILY. 90 tablet 0  . metoprolol succinate (TOPROL-XL) 25 MG 24 hr tablet TAKE 1  TABLET BY MOUTH ONCE A DAY. 90 tablet 0  . Multiple Vitamin (MULTIVITAMIN WITH MINERALS) TABS Take 1 tablet by mouth at bedtime.     Marland Kitchen omeprazole (PRILOSEC) 20 MG capsule Take 1 capsule (20 mg total) by mouth daily. 90 capsule 0  . ondansetron (ZOFRAN ODT) 4 MG disintegrating tablet Take 1 tablet (4 mg total) by mouth every 8 (eight) hours as needed for nausea or vomiting. 20 tablet 0  . OVER THE COUNTER MEDICATION Take 300 mg by mouth in the morning and at bedtime. Anthon Support    . predniSONE (DELTASONE) 10 MG tablet Take 2 tablets (20 mg total) by mouth daily. (Patient not taking: Reported on 12/09/2019) 15 tablet 0  . predniSONE (DELTASONE) 20 MG tablet Take 2 tablets (40 mg total) by mouth daily with breakfast. 10 tablet 1  . venlafaxine XR (EFFEXOR-XR) 75 MG 24 hr capsule TAKE (1) CAPSULE BY MOUTH TWICE DAILY. 60 capsule 0  . [DISCONTINUED] venlafaxine XR (EFFEXOR-XR) 75 MG 24 hr capsule TAKE (  1) CAPSULE BY MOUTH TWICE DAILY. 60 capsule 3   Social History   Socioeconomic History  . Marital status: Married    Spouse name: Not on file  . Number of children: 2  . Years of education: 12th grade  . Highest education level: Not on file  Occupational History  . Occupation: retired    Fish farm manager: [employer]  Tobacco Use  . Smoking status: Never Smoker  . Smokeless tobacco: Never Used  Vaping Use  . Vaping Use: Never used  Substance and Sexual Activity  . Alcohol use: Yes    Comment: 05/08/2012 "margarita or glass of wine once or twice/yr"  . Drug use: No  . Sexual activity: Never  Other Topics Concern  . Not on file  Social History Narrative   Married 48 yrs.Lives with wife .Retired.   Social Determinants of Health   Financial Resource Strain: Not on file  Food Insecurity: Not on file  Transportation Needs: Not on file  Physical Activity: Not on file  Stress: Not on file  Social Connections: Not on file  Intimate Partner Violence: Not on file    Family History  Problem Relation Age of Onset  . Lung cancer Mother 84       smoker  . Leukemia Father 32       Hairy Cell at 22, CLL at 12  . Lymphoma Father 38       Hodgkins lymphoma  . Lung cancer Sister 68  . Thyroid cancer Brother 46       Medullary  . Lymphoma Brother 60       Follicular lymphoma  . Breast cancer Maternal Aunt        mother's paternal half sister  . Lung cancer Maternal Uncle        mother's paternal half brother  . Lung cancer Maternal Grandmother        smoker  . Other Daughter        leiomyoma of esophagus  . Colon cancer Maternal Aunt        mother's maternal half sister  . Cancer Paternal Uncle        oral cancer dx in his 11s  . Lung cancer Cousin        paternal cousin  . Breast cancer Cousin        paternal cousin died in her 87s    OBJECTIVE:  Vitals:   12/15/19 1721  BP: (!) 188/78  Pulse: 93  Resp: 20  Temp: 98.9 F (37.2 C)  SpO2: 95%     General appearance: alert; appears fatigued, but nontoxic; speaking in full sentences and tolerating own secretions HEENT: NCAT; Ears: EACs clear, TMs pearly gray; Eyes: PERRL.  EOM grossly intact. Sinuses: nontender; Nose: nares patent without rhinorrhea, Throat: oropharynx clear, tonsils non erythematous or enlarged, uvula midline  Neck: supple without LAD Lungs: unlabored respirations, symmetrical air entry; cough: moderate; no respiratory distress; CTAB Heart: regular rate and rhythm.  Radial pulses 2+ symmetrical bilaterally Skin: warm and dry Psychological: alert and cooperative; normal mood and affect  LABS:  No results found for this or any previous visit (from the past 24 hour(s)).   RADIOLOGY:  DG Chest 2 View  Result Date: 12/15/2019 CLINICAL DATA:  74 year old female with persistent cough. EXAM: CHEST - 2 VIEW COMPARISON:  Chest radiograph dated 05/21/2017. FINDINGS: No focal consolidation, pleural effusion or pneumothorax. The cardiac silhouette is within limits.  Atherosclerotic calcification of the aorta. Mediastinal and left axillary  surgical clips. No acute osseous pathology. Degenerative changes of the spine. Cholecystectomy clips. IMPRESSION: No active cardiopulmonary disease. Electronically Signed   By: Anner Crete M.D.   On: 12/15/2019 17:52     Chest x-ray is negative for cardiopulmonary disease.  I have reviewed the x-ray myself and the radiologist interpretation.  I am in agreement with the radiologist interpretation.   ASSESSMENT & PLAN:  1. Cough   2. Acute bronchitis due to other specified organisms     Meds ordered this encounter  Medications  . benzonatate (TESSALON) 100 MG capsule    Sig: Take 1 capsule (100 mg total) by mouth 3 (three) times daily as needed for cough.    Dispense:  30 capsule    Refill:  0    Discharge Instructions    Get plenty of rest and push fluids Tessalon Perles prescribed for cough Continue prednisone and azithromycin as prescribed and directed by PCP use medications daily for symptom relief Use OTC medications like ibuprofen or tylenol as needed fever or pain Call or go to the ED if you have any new or worsening symptoms such as fever, worsening cough, shortness of breath, chest tightness, chest pain, turning blue, changes in mental status, etc...   Reviewed expectations re: course of current medical issues. Questions answered. Outlined signs and symptoms indicating need for more acute intervention. Patient verbalized understanding. After Visit Summary given.         Emerson Monte, FNP 12/15/19 1805    Emerson Monte, FNP 12/15/19 1807

## 2019-12-15 NOTE — Telephone Encounter (Signed)
Please see message. Please advise. Patient has called back and wanting to know what she needs to do.

## 2019-12-15 NOTE — Discharge Instructions (Signed)
Get plenty of rest and push fluids Tessalon Perles prescribed for cough Continue prednisone and azithromycin as prescribed and directed by PCP use medications daily for symptom relief Use OTC medications like ibuprofen or tylenol as needed fever or pain Call or go to the ED if you have any new or worsening symptoms such as fever, worsening cough, shortness of breath, chest tightness, chest pain, turning blue, changes in mental status, etc..Marland Kitchen

## 2019-12-15 NOTE — Telephone Encounter (Signed)
I have sent a prescription for repeat Z-Pak and also at this time prednisone which she takes for 5 days only.  I have sent these prescriptions to [facility]. If she does not improve after this, she needs to make an appointment to be seen.

## 2019-12-15 NOTE — Telephone Encounter (Signed)
Patient called and left a detailed voice message and stated that she finished her Zpack and she is not any better and still having coughing, wheezing, and upper respiratory symptoms.   Please advise what patient needs to do next.

## 2019-12-15 NOTE — ED Triage Notes (Signed)
Pt has had cough for 2 weeks, not improved  With z pack, pts PCP suggested chest x ray after tele visit

## 2019-12-15 NOTE — Telephone Encounter (Signed)
Called patient to give her the message and she told me that she is worried that she could have bronchitis or pneumonia because she is having wheezing and rattling when she breaths. I advised Dr. Anastasio Champion and he stated that she needs to go to the ER for full evaluation. I advised the patient and she does not want to go to the ER and stated that she will decide and let us know. Patient stated that she will go if she feels like she needs to. Patient verbalized an understanding.

## 2019-12-30 ENCOUNTER — Other Ambulatory Visit (INDEPENDENT_AMBULATORY_CARE_PROVIDER_SITE_OTHER): Payer: Self-pay | Admitting: Nurse Practitioner

## 2019-12-30 ENCOUNTER — Other Ambulatory Visit (INDEPENDENT_AMBULATORY_CARE_PROVIDER_SITE_OTHER): Payer: Self-pay | Admitting: Internal Medicine

## 2019-12-30 ENCOUNTER — Other Ambulatory Visit (HOSPITAL_COMMUNITY): Payer: Self-pay | Admitting: Hematology

## 2020-01-13 ENCOUNTER — Ambulatory Visit (INDEPENDENT_AMBULATORY_CARE_PROVIDER_SITE_OTHER): Payer: PPO | Admitting: Internal Medicine

## 2020-01-14 ENCOUNTER — Other Ambulatory Visit (HOSPITAL_COMMUNITY): Payer: Self-pay | Admitting: *Deleted

## 2020-01-14 DIAGNOSIS — M8580 Other specified disorders of bone density and structure, unspecified site: Secondary | ICD-10-CM

## 2020-01-14 DIAGNOSIS — Z170 Estrogen receptor positive status [ER+]: Secondary | ICD-10-CM

## 2020-01-14 DIAGNOSIS — C50111 Malignant neoplasm of central portion of right female breast: Secondary | ICD-10-CM

## 2020-01-15 ENCOUNTER — Inpatient Hospital Stay (HOSPITAL_COMMUNITY): Payer: PPO | Attending: Hematology

## 2020-01-15 ENCOUNTER — Inpatient Hospital Stay (HOSPITAL_COMMUNITY): Payer: PPO

## 2020-01-15 ENCOUNTER — Other Ambulatory Visit: Payer: Self-pay

## 2020-01-15 ENCOUNTER — Encounter (HOSPITAL_COMMUNITY): Payer: Self-pay

## 2020-01-15 ENCOUNTER — Ambulatory Visit (INDEPENDENT_AMBULATORY_CARE_PROVIDER_SITE_OTHER): Payer: PPO | Admitting: Internal Medicine

## 2020-01-15 VITALS — BP 172/84 | HR 86 | Temp 98.3°F | Resp 18

## 2020-01-15 DIAGNOSIS — R911 Solitary pulmonary nodule: Secondary | ICD-10-CM | POA: Diagnosis not present

## 2020-01-15 DIAGNOSIS — Z79899 Other long term (current) drug therapy: Secondary | ICD-10-CM | POA: Diagnosis not present

## 2020-01-15 DIAGNOSIS — M8580 Other specified disorders of bone density and structure, unspecified site: Secondary | ICD-10-CM | POA: Diagnosis not present

## 2020-01-15 DIAGNOSIS — Z170 Estrogen receptor positive status [ER+]: Secondary | ICD-10-CM | POA: Diagnosis not present

## 2020-01-15 DIAGNOSIS — C50111 Malignant neoplasm of central portion of right female breast: Secondary | ICD-10-CM | POA: Insufficient documentation

## 2020-01-15 LAB — CBC WITH DIFFERENTIAL/PLATELET
Abs Immature Granulocytes: 0.05 10*3/uL (ref 0.00–0.07)
Basophils Absolute: 0 10*3/uL (ref 0.0–0.1)
Basophils Relative: 1 %
Eosinophils Absolute: 0.1 10*3/uL (ref 0.0–0.5)
Eosinophils Relative: 1 %
HCT: 37.8 % (ref 36.0–46.0)
Hemoglobin: 12.3 g/dL (ref 12.0–15.0)
Immature Granulocytes: 1 %
Lymphocytes Relative: 32 %
Lymphs Abs: 1.8 10*3/uL (ref 0.7–4.0)
MCH: 31.5 pg (ref 26.0–34.0)
MCHC: 32.5 g/dL (ref 30.0–36.0)
MCV: 96.9 fL (ref 80.0–100.0)
Monocytes Absolute: 0.4 10*3/uL (ref 0.1–1.0)
Monocytes Relative: 7 %
Neutro Abs: 3.2 10*3/uL (ref 1.7–7.7)
Neutrophils Relative %: 58 %
Platelets: 284 10*3/uL (ref 150–400)
RBC: 3.9 MIL/uL (ref 3.87–5.11)
RDW: 12.4 % (ref 11.5–15.5)
WBC: 5.5 10*3/uL (ref 4.0–10.5)
nRBC: 0 % (ref 0.0–0.2)

## 2020-01-15 LAB — COMPREHENSIVE METABOLIC PANEL
ALT: 33 U/L (ref 0–44)
AST: 29 U/L (ref 15–41)
Albumin: 4.1 g/dL (ref 3.5–5.0)
Alkaline Phosphatase: 30 U/L — ABNORMAL LOW (ref 38–126)
Anion gap: 8 (ref 5–15)
BUN: 16 mg/dL (ref 8–23)
CO2: 24 mmol/L (ref 22–32)
Calcium: 9.5 mg/dL (ref 8.9–10.3)
Chloride: 106 mmol/L (ref 98–111)
Creatinine, Ser: 0.82 mg/dL (ref 0.44–1.00)
GFR, Estimated: >60 mL/min (ref >60)
Glucose, Bld: 174 mg/dL — ABNORMAL HIGH (ref 70–99)
Potassium: 4.2 mmol/L (ref 3.5–5.1)
Sodium: 138 mmol/L (ref 135–145)
Total Bilirubin: 0.5 mg/dL (ref 0.3–1.2)
Total Protein: 7.1 g/dL (ref 6.5–8.1)

## 2020-01-15 MED ORDER — DENOSUMAB 60 MG/ML ~~LOC~~ SOSY
60.0000 mg | PREFILLED_SYRINGE | Freq: Once | SUBCUTANEOUS | Status: AC
  Administered 2020-01-15: 60 mg via SUBCUTANEOUS

## 2020-01-15 MED ORDER — DENOSUMAB 60 MG/ML ~~LOC~~ SOLN: 60.0000 mg | Freq: Once | SUBCUTANEOUS | Status: DC

## 2020-01-15 MED ORDER — DENOSUMAB 60 MG/ML ~~LOC~~ SOSY
PREFILLED_SYRINGE | SUBCUTANEOUS | Status: AC
  Filled 2020-01-15: qty 1

## 2020-01-15 NOTE — Patient Instructions (Signed)
[place] at [facility] Discharge Instructions  Received Prolia injection today. Follow-up as scheduled   Thank you for choosing [place] at [facility] to provide your oncology and hematology care.  To afford each patient quality time with our provider, please arrive at least 15 minutes before your scheduled appointment time.   If you have a lab appointment with the [facility] please come in thru the Main Entrance and check in at the main information desk.  You need to re-schedule your appointment should you arrive 10 or more minutes late.  We strive to give you quality time with our providers, and arriving late affects you and other patients whose appointments are after yours.  Also, if you no show three or more times for appointments you may be dismissed from the clinic at the providers discretion.     Again, thank you for choosing [facility]  Our hope is that these requests will decrease the amount of time that you wait before being seen by our physicians.       _____________________________________________________________  Should you have questions after your visit to [facility], please contact our office at [phone] and follow the prompts.  Our office hours are 8:00 a.m. and 4:30 p.m. Monday - Friday.  Please note that voicemails left after 4:00 p.m. may not be returned until the following business day.  We are closed weekends and major holidays.  You do have access to a nurse 24-7, just call the main number to the clinic [phone] and do not press any options, hold on the line and a nurse will answer the phone.    For prescription refill requests, have your pharmacy contact our office and allow 72 hours.    Due to Covid, you will need to wear a mask upon entering the hospital. If you do not have a mask, a mask will be given to you at the Main Entrance upon arrival. For doctor visits, patients may have  1 support person age 110 or older with them. For treatment visits, patients can not have anyone with them due to social distancing guidelines and our immunocompromised population.

## 2020-01-15 NOTE — Progress Notes (Signed)
Amy Houston tolerated Prolia injection well without complaints or incident. Calcium 9.5 today and pt denied any tooth or jaw pain and no recent or future dental visits prior to administering this medication. Pt continues to take Calcium PO as prescribed without issues.VSS Pt discharged self ambulatory in satisfactory condition

## 2020-01-27 ENCOUNTER — Ambulatory Visit (INDEPENDENT_AMBULATORY_CARE_PROVIDER_SITE_OTHER): Payer: PPO | Admitting: Internal Medicine

## 2020-01-27 ENCOUNTER — Other Ambulatory Visit: Payer: Self-pay

## 2020-01-27 ENCOUNTER — Encounter (INDEPENDENT_AMBULATORY_CARE_PROVIDER_SITE_OTHER): Payer: Self-pay | Admitting: Internal Medicine

## 2020-01-27 VITALS — BP 132/78 | HR 75 | Temp 97.3°F | Ht <= 58 in | Wt 160.6 lb

## 2020-01-27 DIAGNOSIS — R0789 Other chest pain: Secondary | ICD-10-CM | POA: Diagnosis not present

## 2020-01-27 DIAGNOSIS — I10 Essential (primary) hypertension: Secondary | ICD-10-CM

## 2020-01-27 DIAGNOSIS — E785 Hyperlipidemia, unspecified: Secondary | ICD-10-CM | POA: Diagnosis not present

## 2020-01-27 DIAGNOSIS — Z1159 Encounter for screening for other viral diseases: Secondary | ICD-10-CM | POA: Diagnosis not present

## 2020-01-27 DIAGNOSIS — E119 Type 2 diabetes mellitus without complications: Secondary | ICD-10-CM

## 2020-01-27 NOTE — Progress Notes (Addendum)
Metrics: Intervention Frequency ACO  Documented Smoking Status Yearly  Screened one or more times in 24 months  Cessation Counseling or  Active cessation medication Past 24 months  Past 24 months   Guideline developer: UpToDate (See UpToDate for funding source) Date Released: 2014       Wellness Office Visit  Subjective:  Patient ID: Amy Houston, female    DOB: 02-25-1945  Age: 75 y.o. MRN: [id number]  CC: This lady comes in for follow-up of her hypertension, diabetes, dyslipidemia. HPI For the last 2 to 3 months, she describes chest heaviness when she walks either on the treadmill or anytime she walks.  The chest heaviness typically last about 5 minutes and is not associated with radiation of the discomfort, sweating, dyspnea or nausea.  When she rests after walking, the chest heaviness resolves. She certainly has multiple cardiac risk factors. She is a diet-controlled diabetic. She continues to take lisinopril for hypertension. She does admit that she has been under a lot of stress with family members who have been very sick recently.  Past Medical History:  Diagnosis Date  . Abnormal genetic test 04/09/2013   PALB2 and CHEK2 positivity   . Arthritis    "of the spine" (05/08/2012)  . Breast cancer ([place]) 1995   "left" (05/08/2012)  . Breast cancer, right breast, IDC, Multifocal 03/12/2012   Multifocal, 10 and 8 left breast   . Diabetes mellitus 1999   "dx; never on meds" (05/08/2012)  . Diabetes mellitus without complication ([place]) 09/07/7589  . Essential hypertension, benign 01/08/2019  . Exertional shortness of breath   . Family history of thyroid cancer   . GERD (gastroesophageal reflux disease)    tums  . H/O hiatal hernia   . Hemorrhoid   . History of blood transfusion    post child birth  . HTN (hypertension)   . IBS (irritable bowel syndrome)   . Osteopenia 12/11/2012   On Ca++ and Vit D. Bone density in April 2014.  Next bone density due in April 2016.  Marland Kitchen PONV  (postoperative nausea and vomiting)   . PPD positive, treated 1999   Past Surgical History:  Procedure Laterality Date  . ABDOMINAL HYSTERECTOMY Bilateral 01/30/2013   Procedure: HYSTERECTOMY ABDOMINAL;  Surgeon: Melina Schools, MD;  Location: [facility];  Service: Gynecology;  Laterality: Bilateral;  . BREAST BIOPSY Left 1995  . BREAST BIOPSY Bilateral 03/2012   "one on the left; 2 on the right" (05/08/2012)  . BREAST LUMPECTOMY Left 1995  . CHOLECYSTECTOMY  1992  . COLONOSCOPY  12/06/93   Dr. Hayes:single diminutive polyp of the descending colon/extrernal hemorrhoids, benign path  . COLONOSCOPY  11/30/98   Dr Hayes:small rectal polyp/internal hemorrhoids, benign path  . COLONOSCOPY  11/26/2006   Dr. Hayes:sigmoid polyp/small interal hemorrhoids, adenomatous  . COLONOSCOPY N/A 03/30/2016   Dr. Oneida Alar: normal TI, 4 mm rectal polyp, multiple small and large-mouthed diverticula in rectosigmoid and sigmoid colon, non-bleeding internal hemorrhoids, tubular adenoma on path. surveillance in 5 yeras  . COLONOSCOPY WITH ESOPHAGOGASTRODUODENOSCOPY (EGD) N/A 07/01/2012   Dr. Oneida Alar: 2 small adenomas, sigmoid colon diverticula, surveillance in 2019  . DILATION AND CURETTAGE OF UTERUS  1968  . ESOPHAGEAL DILATION  07/01/2012   Dr. Oneida Alar: proximal esophageal web, Schatzki's ring at GE junction, s/p dilaiton. Mild non-erosive gastritis. Repeat EGD July 23, 2012.   . ESOPHAGOGASTRODUODENOSCOPY  1994   Dr. Cristina Gong: small hiatal hernia and Schatzki's ring widely patent, CLO test negative  . ESOPHAGOGASTRODUODENOSCOPY (EGD) WITH  ESOPHAGEAL DILATION N/A 07/23/2012   Dr. Fields:proximal esophageal web v. radiation induced stricture/schatzki ring/small HH, s/p dilation  . MASTECTOMY COMPLETE / SIMPLE Left 05/08/2012  . MASTECTOMY COMPLETE / SIMPLE W/ SENTINEL NODE BIOPSY Right 05/08/2012  . SALPINGOOPHORECTOMY Bilateral 01/30/2013   Procedure: SALPINGO OOPHORECTOMY;  Surgeon: Melina Schools, MD;  Location: [facility];   Service: Gynecology;  Laterality: Bilateral;  . SIMPLE MASTECTOMY WITH AXILLARY SENTINEL NODE BIOPSY Right 05/08/2012   Procedure: RIGHT TOTAL MASTECTOMY WITH AXILLARY SENTINEL NODE BIOPSY;  Surgeon: Haywood Lasso, MD;  Location: [facility];  Service: General;  Laterality: Right;  . SIMPLE MASTECTOMY WITH AXILLARY SENTINEL NODE BIOPSY Left 05/08/2012   Procedure: LEFT TOTAL MASTECTOMY;  Surgeon: Haywood Lasso, MD;  Location: [facility];  Service: General;  Laterality: Left;  . TONSILLECTOMY AND ADENOIDECTOMY  ~ 2008  . TUBAL LIGATION  1976  . UVULOPALATOPHARYNGOPLASTY, TONSILLECTOMY AND SEPTOPLASTY  ~ 2008     Family History  Problem Relation Age of Onset  . Lung cancer Mother 31       smoker  . Leukemia Father 4       Hairy Cell at 39, CLL at 60  . Lymphoma Father 23       Hodgkins lymphoma  . Lung cancer Sister 22  . Thyroid cancer Brother 46       Medullary  . Lymphoma Brother 60       Follicular lymphoma  . Breast cancer Maternal Aunt        mother's paternal half sister  . Lung cancer Maternal Uncle        mother's paternal half brother  . Lung cancer Maternal Grandmother        smoker  . Other Daughter        leiomyoma of esophagus  . Colon cancer Maternal Aunt        mother's maternal half sister  . Cancer Paternal Uncle        oral cancer dx in his 26s  . Lung cancer Cousin        paternal cousin  . Breast cancer Cousin        paternal cousin died in her 85s    Social History   Social History Narrative   Married 18 yrs.Lives with wife .Retired.   Social History   Tobacco Use  . Smoking status: Never Smoker  . Smokeless tobacco: Never Used  Substance Use Topics  . Alcohol use: Yes    Comment: 05/08/2012 "margarita or glass of wine once or twice/yr"    Current Meds  Medication Sig  . acetaminophen (TYLENOL) 500 MG tablet Take 1,000 mg by mouth daily as needed for moderate pain or headache.   . calcium-vitamin D (OSCAL WITH D) 500-200 MG-UNIT tablet Take 1  tablet by mouth daily.   . cetirizine (ZYRTEC ALLERGY) 10 MG tablet Take 1 tablet (10 mg total) by mouth daily.  . Cholecalciferol (VITAMIN D) 2000 units CAPS Take 6,000 Units by mouth at bedtime.   . cyanocobalamin 1000 MCG tablet Take 1,000 mcg by mouth in the morning and at bedtime.  . fluticasone (FLONASE) 50 MCG/ACT nasal spray Place 1 spray into both nostrils daily for 14 days. (Patient taking differently: Place 1 spray into both nostrils as needed.)  . letrozole (FEMARA) 2.5 MG tablet TAKE (1) TABLET BY MOUTH ONCE DAILY.  Marland Kitchen lisinopril (ZESTRIL) 20 MG tablet TAKE ONE TABLET BY MOUTH ONCE DAILY.  . metoprolol succinate (TOPROL-XL) 25 MG 24 hr tablet  TAKE 1 TABLET BY MOUTH ONCE A DAY.  . Multiple Vitamin (MULTIVITAMIN WITH MINERALS) TABS Take 1 tablet by mouth at bedtime.   Marland Kitchen omeprazole (PRILOSEC) 20 MG capsule Take 1 capsule (20 mg total) by mouth daily.  . ondansetron (ZOFRAN ODT) 4 MG disintegrating tablet Take 1 tablet (4 mg total) by mouth every 8 (eight) hours as needed for nausea or vomiting.  . venlafaxine XR (EFFEXOR-XR) 75 MG 24 hr capsule TAKE (1) CAPSULE BY MOUTH TWICE DAILY.      Depression screen [facility] 2/9 01/27/2020 07/24/2019 04/23/2019 01/08/2019 12/16/2013  Decreased Interest 0 0 0 0 0  Down, Depressed, Hopeless 1 0 0 1 0  PHQ - 2 Score 1 0 0 1 0  Altered sleeping 1 - - - -  Tired, decreased energy 1 - - - -  Change in appetite 0 - - - -  Feeling bad or failure about yourself  0 - - - -  Trouble concentrating 0 - - - -  Moving slowly or fidgety/restless 0 - - - -  Suicidal thoughts 0 - - - -  PHQ-9 Score 3 - - - -  Difficult doing work/chores Not difficult at all - - - -  Some recent data might be hidden     Objective:   Today's Vitals: BP 132/78   Pulse 75   Temp (!) 97.3 F (36.3 C) (Temporal)   Ht 4' 10" (1.473 m)   Wt 160 lb 9.6 oz (72.8 kg)   SpO2 98%   BMI 33.57 kg/m  Vitals with BMI 01/27/2020 01/15/2020 12/15/2019  Height 4' 10" - -  Weight 160 lbs  10 oz - -  BMI 09.70 - -  Systolic 449 252 415  Diastolic 78 84 78  Pulse 75 86 93     Physical Exam  She is not in any acute pain at the present time.  Blood pressure is acceptable.  Weight is stable.  She remains obese.     Assessment   1. Chest heaviness   2. Essential hypertension, benign   3. Diabetes mellitus without complication ([place])   4. Hyperlipidemia, unspecified hyperlipidemia type   5. Encounter for hepatitis C screening test for low risk patient       Tests ordered Orders Placed This Encounter  Procedures  . COMPLETE METABOLIC PANEL WITH GFR  . Hemoglobin A1c  . Lipid panel  . Hepatitis C antibody  . Ambulatory referral to Cardiology     Plan: 1. I am concerned about the the chest heaviness that she describes and I think this represents coronary artery disease.  We will get it to see cardiology as soon as possible for further evaluation.  She will need a stress test at the minimum I suspect.  I have told her that if the chest heaviness is prolonged, she will need to go to the emergency room. 2. She will continue with lisinopril for hypertension and we will check renal function today. 3. Her diabetes is diet controlled and we will check an A1c today. 4. Follow-up with Judson Roch in about 3 months or earlier should the need arise.   No orders of the defined types were placed in this encounter.   Doree Albee, MD

## 2020-01-28 LAB — COMPLETE METABOLIC PANEL WITH GFR
AG Ratio: 1.9 (calc) (ref 1.0–2.5)
ALT: 36 U/L — ABNORMAL HIGH (ref 6–29)
AST: 30 U/L (ref 10–35)
Albumin: 4.4 g/dL (ref 3.6–5.1)
Alkaline phosphatase (APISO): 34 U/L — ABNORMAL LOW (ref 37–153)
BUN: 17 mg/dL (ref 7–25)
CO2: 23 mmol/L (ref 20–32)
Calcium: 9.4 mg/dL (ref 8.6–10.4)
Chloride: 107 mmol/L (ref 98–110)
Creat: 0.81 mg/dL (ref 0.60–0.93)
GFR, Est African American: 84 mL/min/{1.73_m2} (ref >=60)
GFR, Est Non African American: 72 mL/min/{1.73_m2} (ref >=60)
Globulin: 2.3 g/dL (calc) (ref 1.9–3.7)
Glucose, Bld: 155 mg/dL — ABNORMAL HIGH (ref 65–99)
Potassium: 4 mmol/L (ref 3.5–5.3)
Sodium: 141 mmol/L (ref 135–146)
Total Bilirubin: 0.6 mg/dL (ref 0.2–1.2)
Total Protein: 6.7 g/dL (ref 6.1–8.1)

## 2020-01-28 LAB — LIPID PANEL
Cholesterol: 188 mg/dL (ref <200)
HDL: 44 mg/dL — ABNORMAL LOW (ref >=50)
LDL Cholesterol (Calc): 115 mg/dL (calc) — ABNORMAL HIGH
Non-HDL Cholesterol (Calc): 144 mg/dL (calc) — ABNORMAL HIGH (ref <130)
Total CHOL/HDL Ratio: 4.3 (calc) (ref <5.0)
Triglycerides: 169 mg/dL — ABNORMAL HIGH (ref <150)

## 2020-01-28 LAB — HEMOGLOBIN A1C
Hgb A1c MFr Bld: 7.1 % of total Hgb — ABNORMAL HIGH (ref <5.7)
Mean Plasma Glucose: 157 mg/dL
eAG (mmol/L): 8.7 mmol/L

## 2020-01-28 LAB — HEPATITIS C ANTIBODY
Hepatitis C Ab: NONREACTIVE
SIGNAL TO CUT-OFF: 0.01 (ref <1.00)

## 2020-02-04 ENCOUNTER — Ambulatory Visit: Payer: PPO | Admitting: Cardiology

## 2020-02-04 ENCOUNTER — Encounter: Payer: Self-pay | Admitting: Cardiology

## 2020-02-04 ENCOUNTER — Other Ambulatory Visit: Payer: Self-pay

## 2020-02-04 VITALS — BP 139/85 | HR 73 | Temp 97.8°F | Resp 17 | Ht <= 58 in | Wt 162.8 lb

## 2020-02-04 DIAGNOSIS — I2510 Atherosclerotic heart disease of native coronary artery without angina pectoris: Secondary | ICD-10-CM

## 2020-02-04 DIAGNOSIS — E7800 Pure hypercholesterolemia, unspecified: Secondary | ICD-10-CM | POA: Diagnosis not present

## 2020-02-04 DIAGNOSIS — I10 Essential (primary) hypertension: Secondary | ICD-10-CM | POA: Diagnosis not present

## 2020-02-04 DIAGNOSIS — E119 Type 2 diabetes mellitus without complications: Secondary | ICD-10-CM

## 2020-02-04 DIAGNOSIS — I209 Angina pectoris, unspecified: Secondary | ICD-10-CM

## 2020-02-04 MED ORDER — ROSUVASTATIN CALCIUM 5 MG PO TABS: 5.0000 mg | ORAL_TABLET | Freq: Every day | ORAL | 3 refills | Status: DC

## 2020-02-04 MED ORDER — METOPROLOL SUCCINATE ER 50 MG PO TB24: 50.0000 mg | ORAL_TABLET | Freq: Every day | ORAL | 1 refills | Status: DC

## 2020-02-04 MED ORDER — ASPIRIN 81 MG PO CHEW: 81.0000 mg | CHEWABLE_TABLET | Freq: Every day | ORAL | Status: DC

## 2020-02-04 MED ORDER — NITROGLYCERIN 0.4 MG SL SUBL: 0.4000 mg | SUBLINGUAL_TABLET | SUBLINGUAL | 3 refills | Status: DC | PRN

## 2020-02-04 NOTE — Progress Notes (Signed)
Primary Physician/Referring:  Ailene Ards, NP  Patient ID: Amy Houston, female    DOB: 1945/03/28, 75 y.o.   MRN: [id number]  Chief Complaint  Patient presents with  . Chest Pain  . New Patient (Initial Visit)    Referred by Dr. Hurshel Party   HPI:    Amy Houston  is a 75 y.o. History of hypertension, hyperlipidemia, diabetes mellitus, mild obesity, family history of multiple ovarian/uterine or colon cancer, has had bilateral salpingo-oophorectomy in 2015 and bilateral mastectomy in 2014 and underwent chemotherapy and chest RT for the same. She also has GERD.  No referred to me by Dr. Anastasio Champion for evaluation of chest tightness.  Past 2 to 3 months, she started noticing chest tightness with exertional activity sometimes with radiation to her jaw.  Chest pain comes on with activity and relieved with rest.  Symptoms have been progressive, she is now getting frequent chest discomfort even with routine activities and hence has stopped exercising.  She has mild dyspnea and arthritis limits her activity but no other specific symptoms.  Accompanied by her daughter.  Past Medical History:  Diagnosis Date  . Abnormal genetic test 04/09/2013   PALB2 and CHEK2 positivity   . Arthritis    "of the spine" (05/08/2012)  . Breast cancer ([place]) 1995   "left" (05/08/2012)  . Breast cancer, right breast, IDC, Multifocal 03/12/2012   Multifocal, 10 and 8 left breast   . Diabetes mellitus 1999   "dx; never on meds" (05/08/2012)  . Diabetes mellitus without complication ([place]) 08/08/6809  . Essential hypertension, benign 01/08/2019  . Exertional shortness of breath   . Family history of thyroid cancer   . GERD (gastroesophageal reflux disease)    tums  . H/O hiatal hernia   . Hemorrhoid   . History of blood transfusion    post child birth  . HTN (hypertension)   . IBS (irritable bowel syndrome)   . Osteopenia 12/11/2012   On Ca++ and Vit D. Bone density in April 2014.  Next bone density due in  April 2016.  Marland Kitchen PONV (postoperative nausea and vomiting)   . PPD positive, treated 1999   Past Surgical History:  Procedure Laterality Date  . ABDOMINAL HYSTERECTOMY Bilateral 01/30/2013   Procedure: HYSTERECTOMY ABDOMINAL;  Surgeon: Melina Schools, MD;  Location: [facility];  Service: Gynecology;  Laterality: Bilateral;  . BREAST BIOPSY Left 1995  . BREAST BIOPSY Bilateral 03/2012   "one on the left; 2 on the right" (05/08/2012)  . BREAST LUMPECTOMY Left 1995  . CHOLECYSTECTOMY  1992  . COLONOSCOPY  12/06/93   Dr. Hayes:single diminutive polyp of the descending colon/extrernal hemorrhoids, benign path  . COLONOSCOPY  11/30/98   Dr Hayes:small rectal polyp/internal hemorrhoids, benign path  . COLONOSCOPY  11/26/2006   Dr. Hayes:sigmoid polyp/small interal hemorrhoids, adenomatous  . COLONOSCOPY N/A 03/30/2016   Dr. Oneida Alar: normal TI, 4 mm rectal polyp, multiple small and large-mouthed diverticula in rectosigmoid and sigmoid colon, non-bleeding internal hemorrhoids, tubular adenoma on path. surveillance in 5 yeras  . COLONOSCOPY WITH ESOPHAGOGASTRODUODENOSCOPY (EGD) N/A 07/01/2012   Dr. Oneida Alar: 2 small adenomas, sigmoid colon diverticula, surveillance in 2019  . DILATION AND CURETTAGE OF UTERUS  1968  . ESOPHAGEAL DILATION  07/01/2012   Dr. Oneida Alar: proximal esophageal web, Schatzki's ring at GE junction, s/p dilaiton. Mild non-erosive gastritis. Repeat EGD July 23, 2012.   . ESOPHAGOGASTRODUODENOSCOPY  1994   Dr. Cristina Gong: small hiatal hernia and Schatzki's ring widely patent, CLO  test negative  . ESOPHAGOGASTRODUODENOSCOPY (EGD) WITH ESOPHAGEAL DILATION N/A 07/23/2012   Dr. Fields:proximal esophageal web v. radiation induced stricture/schatzki ring/small HH, s/p dilation  . MASTECTOMY COMPLETE / SIMPLE Left 05/08/2012  . MASTECTOMY COMPLETE / SIMPLE W/ SENTINEL NODE BIOPSY Right 05/08/2012  . SALPINGOOPHORECTOMY Bilateral 01/30/2013   Procedure: SALPINGO OOPHORECTOMY;  Surgeon: Melina Schools, MD;   Location: [facility];  Service: Gynecology;  Laterality: Bilateral;  . SIMPLE MASTECTOMY WITH AXILLARY SENTINEL NODE BIOPSY Right 05/08/2012   Procedure: RIGHT TOTAL MASTECTOMY WITH AXILLARY SENTINEL NODE BIOPSY;  Surgeon: Haywood Lasso, MD;  Location: [facility];  Service: General;  Laterality: Right;  . SIMPLE MASTECTOMY WITH AXILLARY SENTINEL NODE BIOPSY Left 05/08/2012   Procedure: LEFT TOTAL MASTECTOMY;  Surgeon: Haywood Lasso, MD;  Location: [facility];  Service: General;  Laterality: Left;  . TONSILLECTOMY AND ADENOIDECTOMY  ~ 2008  . TUBAL LIGATION  1976  . UVULOPALATOPHARYNGOPLASTY, TONSILLECTOMY AND SEPTOPLASTY  ~ 2008   Family History  Problem Relation Age of Onset  . Lung cancer Mother 86       smoker  . Leukemia Father 52       Hairy Cell at 5, CLL at 26  . Lymphoma Father 79       Hodgkins lymphoma  . Lung cancer Sister 63  . Thyroid cancer Brother 46       Medullary  . Lymphoma Brother 60       Follicular lymphoma  . Breast cancer Maternal Aunt        mother's paternal half sister  . Lung cancer Maternal Uncle        mother's paternal half brother  . Lung cancer Maternal Grandmother        smoker  . Other Daughter        leiomyoma of esophagus  . Colon cancer Maternal Aunt        mother's maternal half sister  . Cancer Paternal Uncle        oral cancer dx in his 34s  . Lung cancer Cousin        paternal cousin  . Breast cancer Cousin        paternal cousin died in her 21s    Social History   Tobacco Use  . Smoking status: Never Smoker  . Smokeless tobacco: Never Used  Substance Use Topics  . Alcohol use: Yes    Comment: 05/08/2012 "margarita or glass of wine once or twice/yr"   Marital Status: Married  ROS  Review of Systems  Cardiovascular: Positive for chest pain. Negative for dyspnea on exertion and leg swelling.  Musculoskeletal: Positive for arthritis.  Gastrointestinal: Negative for melena.   Objective  Blood pressure 139/85, pulse 73, temperature  97.8 F (36.6 C), temperature source Temporal, resp. rate 17, height 4' 10"  (1.473 m), weight 162 lb 12.8 oz (73.8 kg), SpO2 97 %.  Vitals with BMI 02/04/2020 01/27/2020 01/15/2020  Height 4' 10"  4' 10"  -  Weight 162 lbs 13 oz 160 lbs 10 oz -  BMI 88.89 16.94 -  Systolic 503 888 280  Diastolic 85 78 84  Pulse 73 75 86     Physical Exam Constitutional:      Comments: She is short stature and mildly obese in no acute distress.  Cardiovascular:     Rate and Rhythm: Normal rate and regular rhythm.     Pulses: Intact distal pulses.     Heart sounds: Normal heart sounds. No murmur heard. No gallop.  Comments: No leg edema, no JVD. Pulmonary:     Effort: Pulmonary effort is normal.     Breath sounds: Normal breath sounds.  Abdominal:     General: Bowel sounds are normal.     Palpations: Abdomen is soft.    Laboratory examination:   Recent Labs    07/14/19 1101 09/04/19 1128 01/15/20 0946 01/27/20 1027  NA 138 141 138 141  K 3.9 4.5 4.2 4.0  CL 104 106 106 107  CO2 23 24 24 23   GLUCOSE 208* 125* 174* 155*  BUN 17 14 16 17   CREATININE 0.86 0.79 0.82 0.81  CALCIUM 9.7 8.9 9.5 9.4  GFRNONAA >60 74 >60 72  GFRAA >60 86  --  84   estimated creatinine clearance is 52.8 mL/min (by C-G formula based on SCr of 0.81 mg/dL).  CMP Latest Ref Rng & Units 01/27/2020 01/15/2020 09/04/2019  Glucose 65 - 99 mg/dL 155(H) 174(H) 125(H)  BUN 7 - 25 mg/dL 17 16 14   Creatinine 0.60 - 0.93 mg/dL 0.81 0.82 0.79  Sodium 135 - 146 mmol/L 141 138 141  Potassium 3.5 - 5.3 mmol/L 4.0 4.2 4.5  Chloride 98 - 110 mmol/L 107 106 106  CO2 20 - 32 mmol/L 23 24 24   Calcium 8.6 - 10.4 mg/dL 9.4 9.5 8.9  Total Protein 6.1 - 8.1 g/dL 6.7 7.1 7.0  Total Bilirubin 0.2 - 1.2 mg/dL 0.6 0.5 0.6  Alkaline Phos 38 - 126 U/L - 30(L) -  AST 10 - 35 U/L 30 29 31   ALT 6 - 29 U/L 36(H) 33 35(H)   CBC Latest Ref Rng & Units 01/15/2020 07/14/2019 07/10/2018  WBC 4.0 - 10.5 K/uL 5.5 6.8 16.8(H)  Hemoglobin 12.0 - 15.0  g/dL 12.3 12.8 12.5  Hematocrit 36.0 - 46.0 % 37.8 39.9 39.2  Platelets 150 - 400 K/uL 284 306 295    Lipid Panel Recent Labs    09/04/19 1128 01/27/20 1027  CHOL 206* 188  TRIG 175* 169*  LDLCALC 130* 115*  HDL 45* 44*  CHOLHDL 4.6 4.3    HEMOGLOBIN A1C Lab Results  Component Value Date   HGBA1C 7.1 (H) 01/27/2020   MPG 157 01/27/2020   TSH Recent Labs    09/04/19 1128  TSH 1.12    Medications and allergies   Allergies  Allergen Reactions  . Penicillins Rash    Has patient had a PCN reaction causing immediate rash, facial/tongue/throat swelling, SOB or lightheadedness with hypotension: Yes Has patient had a PCN reaction causing severe rash involving mucus membranes or skin necrosis: No Has patient had a PCN reaction that required hospitalization No Has patient had a PCN reaction occurring within the last 10 years: No If all of the above answers are "NO", then may proceed with Cephalosporin use.      Outpatient Medications Prior to Visit  Medication Sig Dispense Refill  . acetaminophen (TYLENOL) 500 MG tablet Take 1,000 mg by mouth daily as needed for moderate pain or headache.     . calcium-vitamin D (OSCAL WITH D) 500-200 MG-UNIT tablet Take 1 tablet by mouth daily.     . cetirizine (ZYRTEC ALLERGY) 10 MG tablet Take 1 tablet (10 mg total) by mouth daily. 30 tablet 0  . Cholecalciferol (VITAMIN D) 2000 units CAPS Take 6,000 Units by mouth at bedtime.     . cyanocobalamin 1000 MCG tablet Take 1,000 mcg by mouth in the morning and at bedtime.    . fluticasone (FLONASE) 50 MCG/ACT nasal  spray Place 1 spray into both nostrils daily for 14 days. (Patient taking differently: Place 1 spray into both nostrils as needed.) 16 g 0  . letrozole (FEMARA) 2.5 MG tablet TAKE (1) TABLET BY MOUTH ONCE DAILY. 30 tablet 6  . lisinopril (ZESTRIL) 20 MG tablet TAKE ONE TABLET BY MOUTH ONCE DAILY. 90 tablet 0  . Multiple Vitamin (MULTIVITAMIN WITH MINERALS) TABS Take 1 tablet by  mouth at bedtime.     Marland Kitchen omeprazole (PRILOSEC) 20 MG capsule Take 1 capsule (20 mg total) by mouth daily. 90 capsule 0  . ondansetron (ZOFRAN ODT) 4 MG disintegrating tablet Take 1 tablet (4 mg total) by mouth every 8 (eight) hours as needed for nausea or vomiting. 20 tablet 0  . venlafaxine XR (EFFEXOR-XR) 75 MG 24 hr capsule TAKE (1) CAPSULE BY MOUTH TWICE DAILY. 60 capsule 0  . metoprolol succinate (TOPROL-XL) 25 MG 24 hr tablet TAKE 1 TABLET BY MOUTH ONCE A DAY. 90 tablet 0   No facility-administered medications prior to visit.    Radiology:   No results found.  Cardiac Studies:   None EKG:     EKG 02/04/2020: Normal sinus rhythm at rate of 71 bpm, normal axis.  No evidence of ischemia, normal EKG.  No significant change from EKG 12/30/2018: Normal sinus rhythm.  Assessment     ICD-10-CM   1. Angina pectoris (HCC)  I20.9 EKG 12-Lead    PCV ECHOCARDIOGRAM COMPLETE    PCV MYOCARDIAL PERFUSION WO LEXISCAN    aspirin (ASPIRIN CHILDRENS) 81 MG chewable tablet    nitroGLYCERIN (NITROSTAT) 0.4 MG SL tablet  2. Coronary artery calcification seen on CAT scan  I25.10 PCV MYOCARDIAL PERFUSION WO LEXISCAN  3. Primary hypertension  I10 metoprolol succinate (TOPROL-XL) 50 MG 24 hr tablet  4. Hypercholesteremia  E78.00 rosuvastatin (CRESTOR) 5 MG tablet    Lipid Panel With LDL/HDL Ratio    LDL cholesterol, direct  5. Type 2 diabetes mellitus without complication, without long-term current use of insulin (HCC)  E11.9     Medications Discontinued During This Encounter  Medication Reason  . metoprolol succinate (TOPROL-XL) 25 MG 24 hr tablet Reorder    Meds ordered this encounter  Medications  . rosuvastatin (CRESTOR) 5 MG tablet    Sig: Take 1 tablet (5 mg total) by mouth daily.    Dispense:  90 tablet    Refill:  3  . metoprolol succinate (TOPROL-XL) 50 MG 24 hr tablet    Sig: Take 1 tablet (50 mg total) by mouth daily.    Dispense:  90 tablet    Refill:  1  . aspirin (ASPIRIN  CHILDRENS) 81 MG chewable tablet    Sig: Chew 1 tablet (81 mg total) by mouth daily.  . nitroGLYCERIN (NITROSTAT) 0.4 MG SL tablet    Sig: Place 1 tablet (0.4 mg total) under the tongue every 5 (five) minutes as needed for up to 25 days for chest pain.    Dispense:  25 tablet    Refill:  3   Orders Placed This Encounter  Procedures  . Lipid Panel With LDL/HDL Ratio    Standing Status:   Future    Standing Expiration Date:   02/03/2021  . LDL cholesterol, direct    Standing Status:   Future    Standing Expiration Date:   02/03/2021  . PCV MYOCARDIAL PERFUSION WO LEXISCAN    Standing Status:   Future    Standing Expiration Date:   04/03/2020  . EKG 12-Lead  .  PCV ECHOCARDIOGRAM COMPLETE    Standing Status:   Future    Standing Expiration Date:   02/03/2021   Recommendations:   Amy Houston is a 75 y.o. History of hypertension, hyperlipidemia, diabetes mellitus, mild obesity, family history of multiple ovarian/uterine or colon cancer, has had bilateral salpingo-oophorectomy in 2015 and bilateral mastectomy in 2014 and underwent chemotherapy for the same.  She also has GERD.  No referred to me by Dr. Anastasio Champion for evaluation of chest tightness.  Past 2 to 3 months, she started noticing chest tightness with exertional activity sometimes with radiation to her jaw.  Symptoms are very classic for angina pectoris.  In view of her risk factors including diabetes, hypertension and hyperlipidemia, we discussed making lifestyle changes with diet to control her calorie intake and also although she was skeptical of starting statins, we started her on Crestor 5 mg daily and will obtain lipid profile testing in 4 weeks.  Also increased her metoprolol succinate from 25 mg to 50 mg daily for CAD and angina pectoris. S/L NTG was prescribed and explained how to and when to use it and to notify us if there is change in frequency of use. Patient instructed to start ASA  77m q daily for prophylaxis.   Schedule for a  Exercise Nuclear stress test to evaluate for myocardial ischemia. Will schedule for an echocardiogram. Office visit following the work-up/investigations.  I reviewed her external labs, although previously not on any antidiabetic medications, her recent A1c has increased to >7, patient will discuss further options with Dr. GAnastasio Champion    JAdrian Prows MD, FNorth Atlanta Eye Surgery Center LLC2/02/2020, 9:01 PM Office: [phone]

## 2020-02-04 NOTE — Patient Instructions (Addendum)
Hold Metoprolol Succinate one day before and on the day of stress test Angina  Angina is discomfort or pain in the chest, neck, arm, jaw, or back. The discomfort is caused by a lack of blood in the middle layer of the heart wall. The middle layer of the heart wall is called the myocardium. What are the causes? This condition is caused by a buildup of fat and cholesterol, or plaque, in your arteries. This buildup narrows the arteries and makes it hard for blood to flow. What increases the risk? Main risks  High levels of cholesterol in your blood.  High blood pressure.  Diabetes.  Family history of heart disease.  Not exercising or moving enough.  Depression.  Having had radiation treatment on the left side of your chest. Other risks  Tobacco use.  Too much body weight (obesity).  A diet that is high in unhealthy fats (saturated fats).  Stress.  Using drugs, such as cocaine. Risks for women  Being older than 55 years.  Being in menopause. This is the time when a woman no longer has a menstrual period. What are the signs or symptoms? Symptoms in all people  Chest pain, which may: ? Feel like a crushing or squeezing in the chest. ? Feel like a tightness, pressure, fullness, or heaviness in the chest. ? Last for more than a few minutes at a time. ? Stop and come back (recur) after a few minutes.  Pain in the neck, arm, jaw, or back.  Heartburn or upset stomach (indigestion) for no reason.  Being short of breath.  Feeling like you may vomit (nauseous).  Sudden cold sweats. Symptoms in women and people with diabetes  Tiredness.  Worry and anxiety.  Weakness.  Dizziness or fainting. How is this treated? This condition may be treated with:  Medicines. These can be given to prevent blood clots, stop a heart attack, lower blood pressure, or treat other risk factors.  A procedure to widen a narrowed or blocked artery in the heart.  Surgery to allow blood  to go around a blocked artery. Follow these instructions at home: Medicines  Take over-the-counter and prescription medicines only as told by your doctor.  Do not take these medicines unless your doctor says that you can: ? NSAIDs. These include:  Ibuprofen.  Naproxen. ? Vitamin supplements that have vitamin A, vitamin E, or both. ? Hormone therapy that contains estrogen with or without progestin. Eating and drinking  Eat a heart-healthy diet that includes: ? Lots of fresh fruits and vegetables. ? Whole grains. ? Low-fat (lean) protein. ? Low-fat dairy products.  Follow instructions from your doctor about what you cannot eat or drink.   Activity  Follow an exercise program that your doctor tells you.  Talk with your doctor about joining a program to make your heart strong again (cardiac rehab).  When you feel tired, take a break. Plan breaks if you know you are going to feel tired. Lifestyle  Do not smoke or use any products that contain nicotine or tobacco. If you need help quitting, ask your doctor.  If your doctor says you can drink alcohol: ? Limit how much you have to:  0-1 drink a day for women who are not pregnant.  0-2 drinks a day for men. ? Know how much alcohol is in your drink. In the [place] one drink equals one 12 oz bottle of beer (355 mL), one 5 oz glass of wine (148 mL), or one 1 oz  glass of hard liquor (44 mL).   General instructions  Stay at a healthy weight. If told to lose weight, work with your doctor to do lose weight safely.  Learn to manage stress. If you need help, ask your doctor.  Keep your vaccines up to date. Get a flu shot every year.  Talk with your doctor if you feel sad. Take a screening test to see if you are at risk for depression.  Work with your doctor to manage any other health problems that you have. These may include diabetes or high blood pressure.  Keep all follow-up visits. Get help right away if:  You have pain in  your chest, neck, arm, jaw, or back, and the pain: ? Lasts more than a few minutes. ? Comes back. ? Does not get better after you take medicine under your tongue (sublingual nitroglycerin). ? Keeps getting worse. ? Comes more often.  You have any of these problems: ? Sweating a lot. ? Heartburn or upset stomach. ? Shortness of breath. ? Trouble breathing. ? Feeling like you may vomit. ? Vomiting. ? Feeling more tired than normal. ? Feeling nervous or worrying more than normal. ? Weakness.  You are dizzy or light-headed all of a sudden.  You faint. These symptoms may be an emergency. Get help right away. Call your local emergency services (911 in the [place]  Do not wait to see if the symptoms will go away.  Do not drive yourself to the hospital. Summary  Angina is discomfort or pain in the chest, neck, arm, neck, or back.  Symptoms include chest pain, heartburn or upset stomach, and shortness of breath.  Women or people with diabetes may have other symptoms, such as feeling nervous, being worried, or being weak or tired.  Take all medicines only as told by your doctor.  You should eat a heart-healthy diet and follow an exercise program. This information is not intended to replace advice given to you by your health care provider. Make sure you discuss any questions you have with your health care provider. Document Revised: 06/13/2019 Document Reviewed: 06/13/2019 Elsevier Patient Education  2021 [employer].

## 2020-02-10 ENCOUNTER — Other Ambulatory Visit (INDEPENDENT_AMBULATORY_CARE_PROVIDER_SITE_OTHER): Payer: Self-pay | Admitting: Internal Medicine

## 2020-02-10 ENCOUNTER — Telehealth (INDEPENDENT_AMBULATORY_CARE_PROVIDER_SITE_OTHER): Payer: Self-pay | Admitting: Internal Medicine

## 2020-02-10 ENCOUNTER — Encounter (INDEPENDENT_AMBULATORY_CARE_PROVIDER_SITE_OTHER): Payer: Self-pay | Admitting: Internal Medicine

## 2020-02-10 DIAGNOSIS — U071 COVID-19: Secondary | ICD-10-CM

## 2020-02-10 NOTE — Telephone Encounter (Signed)
When did her symptoms begin?She may be a candidate for treatment if she wants?

## 2020-02-10 NOTE — Telephone Encounter (Signed)
Okay, please let her know that I have put an order in to the [facility] ambulatory clinic for possible treatment.  She may get a phone call.  She can then decide if she wants treatment or not.

## 2020-02-10 NOTE — Telephone Encounter (Signed)
I called patient and she stated that she started having symptoms last Tue 02/03/20 or Wed 02/04/20 and stated that she thought she was on the tail end of it and starting to feel better but if she gets any worse then she would call and do a telemedicine.

## 2020-02-21 ENCOUNTER — Other Ambulatory Visit (INDEPENDENT_AMBULATORY_CARE_PROVIDER_SITE_OTHER): Payer: Self-pay | Admitting: Internal Medicine

## 2020-02-23 ENCOUNTER — Other Ambulatory Visit: Payer: Self-pay

## 2020-02-23 ENCOUNTER — Ambulatory Visit: Payer: PPO

## 2020-02-23 DIAGNOSIS — I2510 Atherosclerotic heart disease of native coronary artery without angina pectoris: Secondary | ICD-10-CM | POA: Diagnosis not present

## 2020-02-23 DIAGNOSIS — I209 Angina pectoris, unspecified: Secondary | ICD-10-CM | POA: Diagnosis not present

## 2020-02-26 ENCOUNTER — Other Ambulatory Visit: Payer: Self-pay | Admitting: Cardiology

## 2020-02-26 DIAGNOSIS — E7800 Pure hypercholesterolemia, unspecified: Secondary | ICD-10-CM | POA: Diagnosis not present

## 2020-02-27 LAB — LIPID PANEL WITH LDL/HDL RATIO
Cholesterol, Total: 138 mg/dL (ref 100–199)
HDL: 41 mg/dL (ref >39)
LDL Chol Calc (NIH): 71 mg/dL (ref 0–99)
LDL/HDL Ratio: 1.7 ratio (ref 0.0–3.2)
Triglycerides: 148 mg/dL (ref 0–149)
VLDL Cholesterol Cal: 26 mg/dL (ref 5–40)

## 2020-02-27 LAB — LDL CHOLESTEROL, DIRECT: LDL Direct: 71 mg/dL (ref 0–99)

## 2020-02-29 NOTE — Telephone Encounter (Signed)
She is  in [place] tomorrow and is trying to see one of [place] tomorrow afternoon. She will need coronary angiogram. Can you see her and she is active on My Chart and you can message when you can see her

## 2020-03-01 ENCOUNTER — Ambulatory Visit: Payer: PPO | Admitting: Student

## 2020-03-01 ENCOUNTER — Other Ambulatory Visit: Payer: Self-pay

## 2020-03-01 ENCOUNTER — Encounter: Payer: Self-pay | Admitting: Student

## 2020-03-01 VITALS — BP 134/74 | HR 74 | Temp 97.8°F | Ht <= 58 in | Wt 164.0 lb

## 2020-03-01 DIAGNOSIS — I2510 Atherosclerotic heart disease of native coronary artery without angina pectoris: Secondary | ICD-10-CM

## 2020-03-01 DIAGNOSIS — I209 Angina pectoris, unspecified: Secondary | ICD-10-CM | POA: Diagnosis not present

## 2020-03-01 NOTE — Telephone Encounter (Signed)
I have reached out to pt. We are coordinating appt.

## 2020-03-01 NOTE — Progress Notes (Signed)
Primary Physician/Referring:  Ailene Ards, NP  Patient ID: Amy Houston, female    DOB: 09/03/1945, 75 y.o.   MRN: [id number]  Chief Complaint  Patient presents with  . Follow-up   HPI:    Amy Houston  is a 75 y.o. History of hypertension, hyperlipidemia, diabetes mellitus, mild obesity, family history of multiple ovarian/uterine or colon cancer, has had bilateral salpingo-oophorectomy in 2015 and bilateral mastectomy in 2014 and underwent chemotherapy and chest RT for the same. She also has GERD.  Now referred to me by Dr. Anastasio Champion for evaluation of chest tightness.  Patient presents for 3-week follow-up for results of cardiac testing.  Patient underwent nuclear stress test with intermediate risk and echocardiogram revealing LVEF 87%, grade 1 diastolic dysfunction and mild mitral regurgitation.  Patient continues to have chest tightness with exertional activities.  Patient presents with her sister and husband present at bedside.  Past Medical History:  Diagnosis Date  . Abnormal genetic test 04/09/2013   PALB2 and CHEK2 positivity   . Arthritis    "of the spine" (05/08/2012)  . Breast cancer ([facility]) 1995   "left" (05/08/2012)  . Breast cancer, right breast, IDC, Multifocal 03/12/2012   Multifocal, 10 and 8 left breast   . Diabetes mellitus 1999   "dx; never on meds" (05/08/2012)  . Diabetes mellitus without complication ([facility]) 05/08/4330  . Essential hypertension, benign 01/08/2019  . Exertional shortness of breath   . Family history of thyroid cancer   . GERD (gastroesophageal reflux disease)    tums  . H/O hiatal hernia   . Hemorrhoid   . History of blood transfusion    post child birth  . HTN (hypertension)   . IBS (irritable bowel syndrome)   . Osteopenia 12/11/2012   On Ca++ and Vit D. Bone density in April 2014.  Next bone density due in April 2016.  Marland Kitchen PONV (postoperative nausea and vomiting)   . PPD positive, treated 1999   Past Surgical History:  Procedure Laterality  Date  . ABDOMINAL HYSTERECTOMY Bilateral 01/30/2013   Procedure: HYSTERECTOMY ABDOMINAL;  Surgeon: Melina Schools, MD;  Location: [facility];  Service: Gynecology;  Laterality: Bilateral;  . BREAST BIOPSY Left 1995  . BREAST BIOPSY Bilateral 03/2012   "one on the left; 2 on the right" (05/08/2012)  . BREAST LUMPECTOMY Left 1995  . CHOLECYSTECTOMY  1992  . COLONOSCOPY  12/06/93   Dr. Hayes:single diminutive polyp of the descending colon/extrernal hemorrhoids, benign path  . COLONOSCOPY  11/30/98   Dr Hayes:small rectal polyp/internal hemorrhoids, benign path  . COLONOSCOPY  11/26/2006   Dr. Hayes:sigmoid polyp/small interal hemorrhoids, adenomatous  . COLONOSCOPY N/A 03/30/2016   Dr. Oneida Alar: normal TI, 4 mm rectal polyp, multiple small and large-mouthed diverticula in rectosigmoid and sigmoid colon, non-bleeding internal hemorrhoids, tubular adenoma on path. surveillance in 5 yeras  . COLONOSCOPY WITH ESOPHAGOGASTRODUODENOSCOPY (EGD) N/A 07/01/2012   Dr. Oneida Alar: 2 small adenomas, sigmoid colon diverticula, surveillance in 2019  . DILATION AND CURETTAGE OF UTERUS  1968  . ESOPHAGEAL DILATION  07/01/2012   Dr. Oneida Alar: proximal esophageal web, Schatzki's ring at GE junction, s/p dilaiton. Mild non-erosive gastritis. Repeat EGD July 23, 2012.   . ESOPHAGOGASTRODUODENOSCOPY  1994   Dr. Cristina Gong: small hiatal hernia and Schatzki's ring widely patent, CLO test negative  . ESOPHAGOGASTRODUODENOSCOPY (EGD) WITH ESOPHAGEAL DILATION N/A 07/23/2012   Dr. Fields:proximal esophageal web v. radiation induced stricture/schatzki ring/small HH, s/p dilation  . MASTECTOMY COMPLETE / SIMPLE Left 05/08/2012  .  MASTECTOMY COMPLETE / SIMPLE W/ SENTINEL NODE BIOPSY Right 05/08/2012  . SALPINGOOPHORECTOMY Bilateral 01/30/2013   Procedure: SALPINGO OOPHORECTOMY;  Surgeon: Melina Schools, MD;  Location: [facility];  Service: Gynecology;  Laterality: Bilateral;  . SIMPLE MASTECTOMY WITH AXILLARY SENTINEL NODE BIOPSY Right 05/08/2012    Procedure: RIGHT TOTAL MASTECTOMY WITH AXILLARY SENTINEL NODE BIOPSY;  Surgeon: Haywood Lasso, MD;  Location: [facility];  Service: General;  Laterality: Right;  . SIMPLE MASTECTOMY WITH AXILLARY SENTINEL NODE BIOPSY Left 05/08/2012   Procedure: LEFT TOTAL MASTECTOMY;  Surgeon: Haywood Lasso, MD;  Location: [facility];  Service: General;  Laterality: Left;  . TONSILLECTOMY AND ADENOIDECTOMY  ~ 2008  . TUBAL LIGATION  1976  . UVULOPALATOPHARYNGOPLASTY, TONSILLECTOMY AND SEPTOPLASTY  ~ 2008   Family History  Problem Relation Age of Onset  . Lung cancer Mother 33       smoker  . Leukemia Father 85       Hairy Cell at 47, CLL at 39  . Lymphoma Father 72       Hodgkins lymphoma  . Lung cancer Sister 49  . Thyroid cancer Brother 46       Medullary  . Lymphoma Brother 60       Follicular lymphoma  . Breast cancer Maternal Aunt        mother's paternal half sister  . Lung cancer Maternal Uncle        mother's paternal half brother  . Lung cancer Maternal Grandmother        smoker  . Other Daughter        leiomyoma of esophagus  . Colon cancer Maternal Aunt        mother's maternal half sister  . Cancer Paternal Uncle        oral cancer dx in his 63s  . Lung cancer Cousin        paternal cousin  . Breast cancer Cousin        paternal cousin died in her 85s    Social History   Tobacco Use  . Smoking status: Never Smoker  . Smokeless tobacco: Never Used  Substance Use Topics  . Alcohol use: Yes    Comment: 05/08/2012 "margarita or glass of wine once or twice/yr"   Marital Status: Married  ROS  Review of Systems  Constitutional: Negative for malaise/fatigue and weight gain.  Cardiovascular: Positive for chest pain. Negative for claudication, dyspnea on exertion, leg swelling, near-syncope, orthopnea, palpitations, paroxysmal nocturnal dyspnea and syncope.  Hematologic/Lymphatic: Does not bruise/bleed easily.  Musculoskeletal: Positive for arthritis.  Gastrointestinal: Negative  for melena.  Neurological: Negative for dizziness and weakness.   Objective  Blood pressure 134/74, pulse 74, temperature 97.8 F (36.6 C), height 4' 10" (1.473 m), weight 164 lb (74.4 kg), SpO2 95 %.  Vitals with BMI 03/01/2020 02/04/2020 01/27/2020  Height 4' 10" 4' 10" 4' 10"  Weight 164 lbs 162 lbs 13 oz 160 lbs 10 oz  BMI 34.29 49.82 64.15  Systolic 830 940 768  Diastolic 74 85 78  Pulse 74 73 75     Physical Exam Constitutional:      Comments: She is short stature and mildly obese in no acute distress.  Cardiovascular:     Rate and Rhythm: Normal rate and regular rhythm.     Pulses: Intact distal pulses.     Heart sounds: Normal heart sounds. No murmur heard. No gallop.      Comments: No leg edema, no JVD. Pulmonary:  Effort: Pulmonary effort is normal.     Breath sounds: Normal breath sounds.  Abdominal:     General: Bowel sounds are normal.     Palpations: Abdomen is soft.  Musculoskeletal:     Right lower leg: No edema.     Left lower leg: No edema.  Skin:    General: Skin is warm and dry.  Neurological:     General: No focal deficit present.     Mental Status: She is oriented to person, place, and time.    Laboratory examination:   Recent Labs    07/14/19 1101 09/04/19 1128 01/15/20 0946 01/27/20 1027  NA 138 141 138 141  K 3.9 4.5 4.2 4.0  CL 104 106 106 107  CO2 _0 GLUCOSE 208* 125* 174* 155*  BUN _1 CREATININE 0.86 0.79 0.82 0.81  CALCIUM 9.7 8.9 9.5 9.4  GFRNONAA >60 74 >60 72  GFRAA >60 86  --  84   CrCl cannot be calculated (Patient's most recent lab result is older than the maximum 21 days allowed.).  CMP Latest Ref Rng & Units 01/27/2020 01/15/2020 09/04/2019  Glucose 65 - 99 mg/dL 155(H) 174(H) 125(H)  BUN 7 - 25 mg/dL _2 Creatinine 0.60 - 0.93 mg/dL 0.81 0.82 0.79  Sodium 135 - 146 mmol/L 141 138 141  Potassium 3.5 - 5.3 mmol/L 4.0 4.2 4.5  Chloride 98 - 110 mmol/L 107 106 106  CO2 20 - 32 mmol/L _3 Calcium 8.6 - 10.4 mg/dL 9.4 9.5 8.9  Total Protein 6.1 - 8.1 g/dL 6.7 7.1 7.0  Total Bilirubin 0.2 - 1.2 mg/dL 0.6 0.5 0.6  Alkaline Phos 38 - 126 U/L - 30(L) -  AST 10 - 35 U/L _4 ALT 6 - 29 U/L 36(H) 33 35(H)   CBC Latest Ref Rng & Units 01/15/2020 07/14/2019 07/10/2018  WBC 4.0 - 10.5 K/uL 5.5 6.8 16.8(H)  Hemoglobin 12.0 - 15.0 g/dL 12.3 12.8 12.5  Hematocrit 36.0 - 46.0 % 37.8 39.9 39.2  Platelets 150 - 400 K/uL 284 306 295    Lipid Panel Recent Labs    09/04/19 1128 01/27/20 1027 02/26/20 1200  CHOL 206* 188 138  TRIG 175* 169* 148  LDLCALC 130* 115* 71  HDL 45* 44* 41  CHOLHDL 4.6 4.3  --   LDLDIRECT  --   --  71    HEMOGLOBIN A1C Lab Results  Component Value Date   HGBA1C 7.1 (H) 01/27/2020   MPG 157 01/27/2020   TSH Recent Labs    09/04/19 1128  TSH 1.12    Medications and allergies   Allergies  Allergen Reactions  . Penicillins Rash    Has patient had a PCN reaction causing immediate rash, facial/tongue/throat swelling, SOB or lightheadedness with hypotension: Yes Has patient had a PCN reaction causing severe rash involving mucus membranes or skin necrosis: No Has patient had a PCN reaction that required hospitalization No Has patient had a PCN reaction occurring within the last 10 years: No If all of the above answers are "NO", then may proceed with Cephalosporin use.      Outpatient Medications Prior to Visit  Medication Sig Dispense Refill  . acetaminophen (TYLENOL) 500 MG tablet Take 1,000 mg by mouth daily as needed for moderate pain or headache.     Marland Kitchen aspirin (ASPIRIN CHILDRENS) 81 MG chewable tablet Chew 1 tablet (81 mg total) by mouth daily.    Marland Kitchen  calcium-vitamin D (OSCAL WITH D) 500-200 MG-UNIT tablet Take 1 tablet by mouth daily.     . cetirizine (ZYRTEC ALLERGY) 10 MG tablet Take 1 tablet (10 mg total) by mouth daily. 30 tablet 0  . Cholecalciferol (VITAMIN D) 2000 units CAPS Take 6,000 Units by mouth at bedtime.     .  ciprofloxacin (CIPRO) 500 MG tablet Take 500 mg by mouth 2 (two) times daily.    . cyanocobalamin 1000 MCG tablet Take 1,000 mcg by mouth in the morning and at bedtime.    Marland Kitchen letrozole (FEMARA) 2.5 MG tablet TAKE (1) TABLET BY MOUTH ONCE DAILY. 30 tablet 6  . lisinopril (ZESTRIL) 20 MG tablet TAKE ONE TABLET BY MOUTH ONCE DAILY. 90 tablet 0  . metoprolol succinate (TOPROL-XL) 50 MG 24 hr tablet Take 1 tablet (50 mg total) by mouth daily. 90 tablet 1  . Multiple Vitamin (MULTIVITAMIN WITH MINERALS) TABS Take 1 tablet by mouth at bedtime.     Marland Kitchen omeprazole (PRILOSEC) 20 MG capsule Take 1 capsule (20 mg total) by mouth daily. 90 capsule 0  . ondansetron (ZOFRAN ODT) 4 MG disintegrating tablet Take 1 tablet (4 mg total) by mouth every 8 (eight) hours as needed for nausea or vomiting. 20 tablet 0  . rosuvastatin (CRESTOR) 5 MG tablet Take 1 tablet (5 mg total) by mouth daily. 90 tablet 3  . venlafaxine XR (EFFEXOR-XR) 75 MG 24 hr capsule TAKE (1) CAPSULE BY MOUTH TWICE DAILY. 60 capsule 3  . fluticasone (FLONASE) 50 MCG/ACT nasal spray Place 1 spray into both nostrils daily for 14 days. (Patient taking differently: Place 1 spray into both nostrils as needed.) 16 g 0  . nitroGLYCERIN (NITROSTAT) 0.4 MG SL tablet Place 1 tablet (0.4 mg total) under the tongue every 5 (five) minutes as needed for up to 25 days for chest pain. 25 tablet 3   No facility-administered medications prior to visit.    Radiology:   No results found.  Cardiac Studies:   PCV ECHOCARDIOGRAM COMPLETE 02/23/2020 Left ventricle cavity is normal in size. Moderate concentric hypertrophy of the left ventricle. Normal global wall motion. Normal LV systolic function with EF 68%. Doppler evidence of grade I (impaired) diastolic dysfunction, normal LAP. Mild (Grade I) mitral regurgitation. No evidence of pulmonary hypertension.   PCV MYOCARDIAL PERFUSION WO LEXISCAN 02/23/2020 Exercise nuclear stress test was performed using [facility]  protocol. Patient reached 7 METS, and 88% of age predicted maximum heart rate. Exercise capacity was low. Non-limiting chest and back discomfort reported. Heart rate and hemodynamic response were normal. Peak EKG/ECG demonstrated sinus tachycardia, 1.5-2 mm horizontal/down-sloping ST depressions in leads II, III, aVF, V4-V6, persisting beyond 2 min into recovery. EKG changes are positive for ischemia. SPECT images show low ventricular volume, small sized, mild intensity, mildly reversible perfusion defect in basal inferoseptal, inferior myocardium. Stress LVEF 66%. Intermediate risk study.  EKG:   EKG 02/04/2020: Normal sinus rhythm at rate of 71 bpm, normal axis.  No evidence of ischemia, normal EKG.  No significant change from EKG 12/30/2018: Normal sinus rhythm.  Assessment     ICD-10-CM   1. Coronary artery calcification seen on CAT scan  I25.10 Novel Coronavirus, NAA ([facility])    CBC    Basic metabolic panel  2. Angina pectoris (HCC)  I20.9 Novel Coronavirus, NAA ([facility])    CBC    Basic metabolic panel    There are no discontinued medications.  No orders of the defined types were placed in this encounter.  Orders Placed This Encounter  Procedures  . Novel Coronavirus, NAA ([facility])    Order Specific Question:   Is this test for diagnosis or screening    Answer:   Screening    Order Specific Question:   Symptomatic for COVID-19 as defined by CDC    Answer:   No    Order Specific Question:   Hospitalized for COVID-19    Answer:   No    Order Specific Question:   Admitted to ICU for COVID-19    Answer:   No    Order Specific Question:   Previously tested for COVID-19    Answer:   Yes    Order Specific Question:   Resident in a congregate (group) care setting    Answer:   No    Order Specific Question:   Is the patient student?    Answer:   No    Order Specific Question:   Employed in healthcare setting    Answer:   No    Order Specific Question:   Has patient completed  COVID vaccination(s) (2 doses of Pfizer/Moderna 1 dose of [employer])    Answer:   Yes    Order Specific Question:   Has patient completed COVID Booster / 3rd dose    Answer:   Yes    Order Specific Question:   Pregnant    Answer:   No  . CBC  . Basic metabolic panel   Recommendations:   DEANETTE TULLIUS is a 75 y.o. History of hypertension, hyperlipidemia, diabetes mellitus, mild obesity, family history of multiple ovarian/uterine or colon cancer, has had bilateral salpingo-oophorectomy in 2015 and bilateral mastectomy in 2014 and underwent chemotherapy for the same.  She also has GERD.  No referred to me by Dr. Anastasio Champion for evaluation of chest tightness.  Patient presents for 3-week follow-up of angina and to discuss results of cardiac testing.  Patient continues to have anginal symptoms.  Reviewed and discussed with patient, and her sister and husband who are present at bedside, results of nuclear stress testing and echocardiogram as detailed above.  In view of multiple cardiovascular risk factors and intermediate risk nuclear stress test recommend proceeding with left heart catheterization coronary angiography.  We will continue present guideline directed medical therapy including aspirin, lisinopril, metoprolol, and rosuvastatin, as well as as needed nitroglycerin.  The left heart catheterization procedure was explained to the patient in detail. The indication, alternatives, risks and benefits were reviewed. Complications including but not limited to bleeding, infection, acute kidney injury, blood transfusion, heart rhythm disturbances, contrast (dye) reaction, damage to the arteries or nerves in the legs or hands, cerebrovascular accident, myocardial infarction, need for emergent bypass surgery, blood clots in the legs, possible need for emergent blood transfusion, and rarely death were reviewed and discussed with the patient. The patient voices understanding and wishes to proceed.  Will  obtain labs and Covid test prior to procedure.  Follow-up in 4 weeks, sooner if needed, following cardiac catheterization.  Patient was seen in collaboration with Dr. Einar Gip and is in agreement of the plan.    Alethia Berthold, PA-C 03/01/2020, 3:28 PM Office: [phone]

## 2020-03-01 NOTE — H&P (View-Only) (Signed)
 Primary Physician/Referring:  Gray, Sarah E, NP  Patient ID: Amy Houston, female    DOB: 04/22/1945, 74 y.o.   MRN: [id number]  Chief Complaint  Patient presents with  . Follow-up   HPI:    Amy Houston  is a 74 y.o. History of hypertension, hyperlipidemia, diabetes mellitus, mild obesity, family history of multiple ovarian/uterine or colon cancer, has had bilateral salpingo-oophorectomy in 2015 and bilateral mastectomy in 2014 and underwent chemotherapy and chest RT for the same. She also has GERD.  Now referred to me by Dr. Gosrani for evaluation of chest tightness.  Patient presents for 3-week follow-up for results of cardiac testing.  Patient underwent nuclear stress test with intermediate risk and echocardiogram revealing LVEF 68%, grade 1 diastolic dysfunction and mild mitral regurgitation.  Patient continues to have chest tightness with exertional activities.  Patient presents with her sister and husband present at bedside.  Past Medical History:  Diagnosis Date  . Abnormal genetic test 04/09/2013   PALB2 and CHEK2 positivity   . Arthritis    "of the spine" (05/08/2012)  . Breast cancer (HCC) 1995   "left" (05/08/2012)  . Breast cancer, right breast, IDC, Multifocal 03/12/2012   Multifocal, 10 and 8 left breast   . Diabetes mellitus 1999   "dx; never on meds" (05/08/2012)  . Diabetes mellitus without complication (HCC) 01/08/2019  . Essential hypertension, benign 01/08/2019  . Exertional shortness of breath   . Family history of thyroid cancer   . GERD (gastroesophageal reflux disease)    tums  . H/O hiatal hernia   . Hemorrhoid   . History of blood transfusion    post child birth  . HTN (hypertension)   . IBS (irritable bowel syndrome)   . Osteopenia 12/11/2012   On Ca++ and Vit D. Bone density in April 2014.  Next bone density due in April 2016.  . PONV (postoperative nausea and vomiting)   . PPD positive, treated 1999   Past Surgical History:  Procedure Laterality  Date  . ABDOMINAL HYSTERECTOMY Bilateral 01/30/2013   Procedure: HYSTERECTOMY ABDOMINAL;  Surgeon: Thomas F Henley, MD;  Location: [facility];  Service: Gynecology;  Laterality: Bilateral;  . BREAST BIOPSY Left 1995  . BREAST BIOPSY Bilateral 03/2012   "one on the left; 2 on the right" (05/08/2012)  . BREAST LUMPECTOMY Left 1995  . CHOLECYSTECTOMY  1992  . COLONOSCOPY  12/06/93   Dr. Hayes:single diminutive polyp of the descending colon/extrernal hemorrhoids, benign path  . COLONOSCOPY  11/30/98   Dr Hayes:small rectal polyp/internal hemorrhoids, benign path  . COLONOSCOPY  11/26/2006   Dr. Hayes:sigmoid polyp/small interal hemorrhoids, adenomatous  . COLONOSCOPY N/A 03/30/2016   Dr. Fields: normal TI, 4 mm rectal polyp, multiple small and large-mouthed diverticula in rectosigmoid and sigmoid colon, non-bleeding internal hemorrhoids, tubular adenoma on path. surveillance in 5 yeras  . COLONOSCOPY WITH ESOPHAGOGASTRODUODENOSCOPY (EGD) N/A 07/01/2012   Dr. FIelds: 2 small adenomas, sigmoid colon diverticula, surveillance in 2019  . DILATION AND CURETTAGE OF UTERUS  1968  . ESOPHAGEAL DILATION  07/01/2012   Dr. Fields: proximal esophageal web, Schatzki's ring at GE junction, s/p dilaiton. Mild non-erosive gastritis. Repeat EGD July 23, 2012.   . ESOPHAGOGASTRODUODENOSCOPY  1994   Dr. Buccini: small hiatal hernia and Schatzki's ring widely patent, CLO test negative  . ESOPHAGOGASTRODUODENOSCOPY (EGD) WITH ESOPHAGEAL DILATION N/A 07/23/2012   Dr. Fields:proximal esophageal web v. radiation induced stricture/schatzki ring/small HH, s/p dilation  . MASTECTOMY COMPLETE / SIMPLE Left 05/08/2012  .   MASTECTOMY COMPLETE / SIMPLE W/ SENTINEL NODE BIOPSY Right 05/08/2012  . SALPINGOOPHORECTOMY Bilateral 01/30/2013   Procedure: SALPINGO OOPHORECTOMY;  Surgeon: Thomas F Henley, MD;  Location: [facility];  Service: Gynecology;  Laterality: Bilateral;  . SIMPLE MASTECTOMY WITH AXILLARY SENTINEL NODE BIOPSY Right 05/08/2012    Procedure: RIGHT TOTAL MASTECTOMY WITH AXILLARY SENTINEL NODE BIOPSY;  Surgeon: Christian J Streck, MD;  Location: [facility];  Service: General;  Laterality: Right;  . SIMPLE MASTECTOMY WITH AXILLARY SENTINEL NODE BIOPSY Left 05/08/2012   Procedure: LEFT TOTAL MASTECTOMY;  Surgeon: Christian J Streck, MD;  Location: [facility];  Service: General;  Laterality: Left;  . TONSILLECTOMY AND ADENOIDECTOMY  ~ 2008  . TUBAL LIGATION  1976  . UVULOPALATOPHARYNGOPLASTY, TONSILLECTOMY AND SEPTOPLASTY  ~ 2008   Family History  Problem Relation Age of Onset  . Lung cancer Mother 60       smoker  . Leukemia Father 67       Hairy Cell at 67, CLL at 72  . Lymphoma Father 82       Hodgkins lymphoma  . Lung cancer Sister 57  . Thyroid cancer Brother 46       Medullary  . Lymphoma Brother 60       Follicular lymphoma  . Breast cancer Maternal Aunt        mother's paternal half sister  . Lung cancer Maternal Uncle        mother's paternal half brother  . Lung cancer Maternal Grandmother        smoker  . Other Daughter        leiomyoma of esophagus  . Colon cancer Maternal Aunt        mother's maternal half sister  . Cancer Paternal Uncle        oral cancer dx in his 60s  . Lung cancer Cousin        paternal cousin  . Breast cancer Cousin        paternal cousin died in her 60s    Social History   Tobacco Use  . Smoking status: Never Smoker  . Smokeless tobacco: Never Used  Substance Use Topics  . Alcohol use: Yes    Comment: 05/08/2012 "margarita or glass of wine once or twice/yr"   Marital Status: Married  ROS  Review of Systems  Constitutional: Negative for malaise/fatigue and weight gain.  Cardiovascular: Positive for chest pain. Negative for claudication, dyspnea on exertion, leg swelling, near-syncope, orthopnea, palpitations, paroxysmal nocturnal dyspnea and syncope.  Hematologic/Lymphatic: Does not bruise/bleed easily.  Musculoskeletal: Positive for arthritis.  Gastrointestinal: Negative  for melena.  Neurological: Negative for dizziness and weakness.   Objective  Blood pressure 134/74, pulse 74, temperature 97.8 F (36.6 C), height 4' 10" (1.473 m), weight 164 lb (74.4 kg), SpO2 95 %.  Vitals with BMI 03/01/2020 02/04/2020 01/27/2020  Height 4' 10" 4' 10" 4' 10"  Weight 164 lbs 162 lbs 13 oz 160 lbs 10 oz  BMI 34.29 34.03 33.57  Systolic 134 139 132  Diastolic 74 85 78  Pulse 74 73 75     Physical Exam Constitutional:      Comments: She is short stature and mildly obese in no acute distress.  Cardiovascular:     Rate and Rhythm: Normal rate and regular rhythm.     Pulses: Intact distal pulses.     Heart sounds: Normal heart sounds. No murmur heard. No gallop.      Comments: No leg edema, no JVD. Pulmonary:       Effort: Pulmonary effort is normal.     Breath sounds: Normal breath sounds.  Abdominal:     General: Bowel sounds are normal.     Palpations: Abdomen is soft.  Musculoskeletal:     Right lower leg: No edema.     Left lower leg: No edema.  Skin:    General: Skin is warm and dry.  Neurological:     General: No focal deficit present.     Mental Status: She is oriented to person, place, and time.    Laboratory examination:   Recent Labs    07/14/19 1101 09/04/19 1128 01/15/20 0946 01/27/20 1027  NA 138 141 138 141  K 3.9 4.5 4.2 4.0  CL 104 106 106 107  CO2 23 24 24 23  GLUCOSE 208* 125* 174* 155*  BUN 17 14 16 17  CREATININE 0.86 0.79 0.82 0.81  CALCIUM 9.7 8.9 9.5 9.4  GFRNONAA >60 74 >60 72  GFRAA >60 86  --  84   CrCl cannot be calculated (Patient's most recent lab result is older than the maximum 21 days allowed.).  CMP Latest Ref Rng & Units 01/27/2020 01/15/2020 09/04/2019  Glucose 65 - 99 mg/dL 155(H) 174(H) 125(H)  BUN 7 - 25 mg/dL 17 16 14  Creatinine 0.60 - 0.93 mg/dL 0.81 0.82 0.79  Sodium 135 - 146 mmol/L 141 138 141  Potassium 3.5 - 5.3 mmol/L 4.0 4.2 4.5  Chloride 98 - 110 mmol/L 107 106 106  CO2 20 - 32 mmol/L 23 24 24   Calcium 8.6 - 10.4 mg/dL 9.4 9.5 8.9  Total Protein 6.1 - 8.1 g/dL 6.7 7.1 7.0  Total Bilirubin 0.2 - 1.2 mg/dL 0.6 0.5 0.6  Alkaline Phos 38 - 126 U/L - 30(L) -  AST 10 - 35 U/L 30 29 31  ALT 6 - 29 U/L 36(H) 33 35(H)   CBC Latest Ref Rng & Units 01/15/2020 07/14/2019 07/10/2018  WBC 4.0 - 10.5 K/uL 5.5 6.8 16.8(H)  Hemoglobin 12.0 - 15.0 g/dL 12.3 12.8 12.5  Hematocrit 36.0 - 46.0 % 37.8 39.9 39.2  Platelets 150 - 400 K/uL 284 306 295    Lipid Panel Recent Labs    09/04/19 1128 01/27/20 1027 02/26/20 1200  CHOL 206* 188 138  TRIG 175* 169* 148  LDLCALC 130* 115* 71  HDL 45* 44* 41  CHOLHDL 4.6 4.3  --   LDLDIRECT  --   --  71    HEMOGLOBIN A1C Lab Results  Component Value Date   HGBA1C 7.1 (H) 01/27/2020   MPG 157 01/27/2020   TSH Recent Labs    09/04/19 1128  TSH 1.12    Medications and allergies   Allergies  Allergen Reactions  . Penicillins Rash    Has patient had a PCN reaction causing immediate rash, facial/tongue/throat swelling, SOB or lightheadedness with hypotension: Yes Has patient had a PCN reaction causing severe rash involving mucus membranes or skin necrosis: No Has patient had a PCN reaction that required hospitalization No Has patient had a PCN reaction occurring within the last 10 years: No If all of the above answers are "NO", then may proceed with Cephalosporin use.      Outpatient Medications Prior to Visit  Medication Sig Dispense Refill  . acetaminophen (TYLENOL) 500 MG tablet Take 1,000 mg by mouth daily as needed for moderate pain or headache.     . aspirin (ASPIRIN CHILDRENS) 81 MG chewable tablet Chew 1 tablet (81 mg total) by mouth daily.    .   calcium-vitamin D (OSCAL WITH D) 500-200 MG-UNIT tablet Take 1 tablet by mouth daily.     . cetirizine (ZYRTEC ALLERGY) 10 MG tablet Take 1 tablet (10 mg total) by mouth daily. 30 tablet 0  . Cholecalciferol (VITAMIN D) 2000 units CAPS Take 6,000 Units by mouth at bedtime.     .  ciprofloxacin (CIPRO) 500 MG tablet Take 500 mg by mouth 2 (two) times daily.    . cyanocobalamin 1000 MCG tablet Take 1,000 mcg by mouth in the morning and at bedtime.    . letrozole (FEMARA) 2.5 MG tablet TAKE (1) TABLET BY MOUTH ONCE DAILY. 30 tablet 6  . lisinopril (ZESTRIL) 20 MG tablet TAKE ONE TABLET BY MOUTH ONCE DAILY. 90 tablet 0  . metoprolol succinate (TOPROL-XL) 50 MG 24 hr tablet Take 1 tablet (50 mg total) by mouth daily. 90 tablet 1  . Multiple Vitamin (MULTIVITAMIN WITH MINERALS) TABS Take 1 tablet by mouth at bedtime.     . omeprazole (PRILOSEC) 20 MG capsule Take 1 capsule (20 mg total) by mouth daily. 90 capsule 0  . ondansetron (ZOFRAN ODT) 4 MG disintegrating tablet Take 1 tablet (4 mg total) by mouth every 8 (eight) hours as needed for nausea or vomiting. 20 tablet 0  . rosuvastatin (CRESTOR) 5 MG tablet Take 1 tablet (5 mg total) by mouth daily. 90 tablet 3  . venlafaxine XR (EFFEXOR-XR) 75 MG 24 hr capsule TAKE (1) CAPSULE BY MOUTH TWICE DAILY. 60 capsule 3  . fluticasone (FLONASE) 50 MCG/ACT nasal spray Place 1 spray into both nostrils daily for 14 days. (Patient taking differently: Place 1 spray into both nostrils as needed.) 16 g 0  . nitroGLYCERIN (NITROSTAT) 0.4 MG SL tablet Place 1 tablet (0.4 mg total) under the tongue every 5 (five) minutes as needed for up to 25 days for chest pain. 25 tablet 3   No facility-administered medications prior to visit.    Radiology:   No results found.  Cardiac Studies:   PCV ECHOCARDIOGRAM COMPLETE 02/23/2020 Left ventricle cavity is normal in size. Moderate concentric hypertrophy of the left ventricle. Normal global wall motion. Normal LV systolic function with EF 68%. Doppler evidence of grade I (impaired) diastolic dysfunction, normal LAP. Mild (Grade I) mitral regurgitation. No evidence of pulmonary hypertension.   PCV MYOCARDIAL PERFUSION WO LEXISCAN 02/23/2020 Exercise nuclear stress test was performed using Bruce  protocol. Patient reached 7 METS, and 88% of age predicted maximum heart rate. Exercise capacity was low. Non-limiting chest and back discomfort reported. Heart rate and hemodynamic response were normal. Peak EKG/ECG demonstrated sinus tachycardia, 1.5-2 mm horizontal/down-sloping ST depressions in leads II, III, aVF, V4-V6, persisting beyond 2 min into recovery. EKG changes are positive for ischemia. SPECT images show low ventricular volume, small sized, mild intensity, mildly reversible perfusion defect in basal inferoseptal, inferior myocardium. Stress LVEF 66%. Intermediate risk study.  EKG:   EKG 02/04/2020: Normal sinus rhythm at rate of 71 bpm, normal axis.  No evidence of ischemia, normal EKG.  No significant change from EKG 12/30/2018: Normal sinus rhythm.  Assessment     ICD-10-CM   1. Coronary artery calcification seen on CAT scan  I25.10 Novel Coronavirus, NAA ([facility])    CBC    Basic metabolic panel  2. Angina pectoris (HCC)  I20.9 Novel Coronavirus, NAA ([facility])    CBC    Basic metabolic panel    There are no discontinued medications.  No orders of the defined types were placed in this encounter.    Orders Placed This Encounter  Procedures  . Novel Coronavirus, NAA ([facility])    Order Specific Question:   Is this test for diagnosis or screening    Answer:   Screening    Order Specific Question:   Symptomatic for COVID-19 as defined by CDC    Answer:   No    Order Specific Question:   Hospitalized for COVID-19    Answer:   No    Order Specific Question:   Admitted to ICU for COVID-19    Answer:   No    Order Specific Question:   Previously tested for COVID-19    Answer:   Yes    Order Specific Question:   Resident in a congregate (group) care setting    Answer:   No    Order Specific Question:   Is the patient student?    Answer:   No    Order Specific Question:   Employed in healthcare setting    Answer:   No    Order Specific Question:   Has patient completed  COVID vaccination(s) (2 doses of Pfizer/Moderna 1 dose of [employer])    Answer:   Yes    Order Specific Question:   Has patient completed COVID Booster / 3rd dose    Answer:   Yes    Order Specific Question:   Pregnant    Answer:   No  . CBC  . Basic metabolic panel   Recommendations:   Amy Houston is a 74 y.o. History of hypertension, hyperlipidemia, diabetes mellitus, mild obesity, family history of multiple ovarian/uterine or colon cancer, has had bilateral salpingo-oophorectomy in 2015 and bilateral mastectomy in 2014 and underwent chemotherapy for the same.  She also has GERD.  No referred to me by Dr. Gosrani for evaluation of chest tightness.  Patient presents for 3-week follow-up of angina and to discuss results of cardiac testing.  Patient continues to have anginal symptoms.  Reviewed and discussed with patient, and her sister and husband who are present at bedside, results of nuclear stress testing and echocardiogram as detailed above.  In view of multiple cardiovascular risk factors and intermediate risk nuclear stress test recommend proceeding with left heart catheterization coronary angiography.  We will continue present guideline directed medical therapy including aspirin, lisinopril, metoprolol, and rosuvastatin, as well as as needed nitroglycerin.  The left heart catheterization procedure was explained to the patient in detail. The indication, alternatives, risks and benefits were reviewed. Complications including but not limited to bleeding, infection, acute kidney injury, blood transfusion, heart rhythm disturbances, contrast (dye) reaction, damage to the arteries or nerves in the legs or hands, cerebrovascular accident, myocardial infarction, need for emergent bypass surgery, blood clots in the legs, possible need for emergent blood transfusion, and rarely death were reviewed and discussed with the patient. The patient voices understanding and wishes to proceed.  Will  obtain labs and Covid test prior to procedure.  Follow-up in 4 weeks, sooner if needed, following cardiac catheterization.  Patient was seen in collaboration with Dr. Ganji and is in agreement of the plan.    Darcie Mellone C Cheryllynn Sarff, PA-C 03/01/2020, 3:28 PM Office: [phone]  

## 2020-03-03 ENCOUNTER — Telehealth (INDEPENDENT_AMBULATORY_CARE_PROVIDER_SITE_OTHER): Payer: Self-pay

## 2020-03-03 DIAGNOSIS — R300 Dysuria: Secondary | ICD-10-CM

## 2020-03-03 NOTE — Telephone Encounter (Signed)
Normally I require patient to do a visit for further evaluation, but due to being short staffed and not having any openings today I will order urinalysis with reflex to culture.  She can go by [facility] labs located across the street from [facility] and provide her sample there.  Once results come in I will order antibiotic as needed.  In the meantime after she drops off a urine sample she can try getting over-the-counter Azo which she can take for symptom relief.  Please let her know that if I have to call her over the weekend regarding her urine results that this call will most likely come from a blocked number so please answer it.  Thank you.

## 2020-03-03 NOTE — Telephone Encounter (Signed)
Patient called and left a detailed voice message and also came by the office, stated that she thinks she is having a UTI and is having pressure, frequent urination, and only small amounts come out. Patient did have some left over Cipro and wanted to know if she can give a urine sample to the [place] lab to see if she has a UTI?

## 2020-03-03 NOTE — Telephone Encounter (Signed)
Called patient and gave her the message. Patient verbalized an understanding and thanked [place] and will go leave a urine sample tomorrow. Patient wanted to let Judson Roch and Dr. Anastasio Champion know that she is going to have a heart cath done on 03/16/2020. Sending as Juluis Rainier.

## 2020-03-04 DIAGNOSIS — R300 Dysuria: Secondary | ICD-10-CM | POA: Diagnosis not present

## 2020-03-04 NOTE — Progress Notes (Signed)
Patient stated that she received message and has already seen Celeste this past Monday, per patient.

## 2020-03-05 LAB — URINALYSIS W MICROSCOPIC + REFLEX CULTURE
Bacteria, UA: NONE SEEN /HPF
Bilirubin Urine: NEGATIVE
Glucose, UA: NEGATIVE
Hgb urine dipstick: NEGATIVE
Hyaline Cast: NONE SEEN /LPF
Ketones, ur: NEGATIVE
Leukocyte Esterase: NEGATIVE
Nitrites, Initial: NEGATIVE
Protein, ur: NEGATIVE
RBC / HPF: NONE SEEN /HPF (ref 0–2)
Specific Gravity, Urine: 1.011 (ref 1.001–1.03)
Squamous Epithelial / HPF: NONE SEEN /HPF (ref <=5)
WBC, UA: NONE SEEN /HPF (ref 0–5)
pH: <=5 (ref 5.0–8.0)

## 2020-03-05 LAB — NO CULTURE INDICATED

## 2020-03-12 DIAGNOSIS — I2510 Atherosclerotic heart disease of native coronary artery without angina pectoris: Secondary | ICD-10-CM | POA: Diagnosis not present

## 2020-03-12 DIAGNOSIS — I209 Angina pectoris, unspecified: Secondary | ICD-10-CM | POA: Diagnosis not present

## 2020-03-13 ENCOUNTER — Other Ambulatory Visit (INDEPENDENT_AMBULATORY_CARE_PROVIDER_SITE_OTHER): Payer: Self-pay | Admitting: Internal Medicine

## 2020-03-13 LAB — CBC
Hematocrit: 36.8 % (ref 34.0–46.6)
Hemoglobin: 12.1 g/dL (ref 11.1–15.9)
MCH: 30.7 pg (ref 26.6–33.0)
MCHC: 32.9 g/dL (ref 31.5–35.7)
MCV: 93 fL (ref 79–97)
Platelets: 272 10*3/uL (ref 150–450)
RBC: 3.94 x10E6/uL (ref 3.77–5.28)
RDW: 12.2 % (ref 11.7–15.4)
WBC: 7.1 10*3/uL (ref 3.4–10.8)

## 2020-03-13 LAB — BASIC METABOLIC PANEL
BUN/Creatinine Ratio: 20 (ref 12–28)
BUN: 16 mg/dL (ref 8–27)
CO2: 20 mmol/L (ref 20–29)
Calcium: 9.4 mg/dL (ref 8.7–10.3)
Chloride: 106 mmol/L (ref 96–106)
Creatinine, Ser: 0.82 mg/dL (ref 0.57–1.00)
Glucose: 154 mg/dL — ABNORMAL HIGH (ref 65–99)
Potassium: 4.6 mmol/L (ref 3.5–5.2)
Sodium: 143 mmol/L (ref 134–144)
eGFR: 75 mL/min/{1.73_m2} (ref >59)

## 2020-03-15 ENCOUNTER — Other Ambulatory Visit (HOSPITAL_COMMUNITY)
Admission: RE | Admit: 2020-03-15 | Discharge: 2020-03-15 | Disposition: A | Discharge status: Home / Self Care | Payer: PPO | Source: Ambulatory Visit | Attending: Cardiology | Admitting: Cardiology

## 2020-03-15 ENCOUNTER — Other Ambulatory Visit: Payer: Self-pay

## 2020-03-15 ENCOUNTER — Ambulatory Visit: Payer: PPO | Admitting: Cardiology

## 2020-03-15 ENCOUNTER — Encounter (HOSPITAL_COMMUNITY): Payer: Self-pay | Admitting: Cardiology

## 2020-03-15 DIAGNOSIS — Z20822 Contact with and (suspected) exposure to covid-19: Secondary | ICD-10-CM | POA: Diagnosis not present

## 2020-03-15 DIAGNOSIS — Z01812 Encounter for preprocedural laboratory examination: Secondary | ICD-10-CM | POA: Insufficient documentation

## 2020-03-15 DIAGNOSIS — I209 Angina pectoris, unspecified: Secondary | ICD-10-CM | POA: Diagnosis present

## 2020-03-15 LAB — SARS CORONAVIRUS 2 (TAT 6-24 HRS): SARS Coronavirus 2: NEGATIVE

## 2020-03-15 NOTE — Progress Notes (Signed)
Called and spoke to pt regarding lab results. Pt voiced understanding.

## 2020-03-15 NOTE — Progress Notes (Signed)
Please inform patient CBC and kidney function/electrolytes are normal.

## 2020-03-16 ENCOUNTER — Ambulatory Visit (HOSPITAL_COMMUNITY)
Admission: RE | Admit: 2020-03-16 | Discharge: 2020-03-16 | Disposition: A | Discharge status: Home / Self Care | Payer: PPO | Attending: Cardiology | Admitting: Cardiology

## 2020-03-16 ENCOUNTER — Encounter (HOSPITAL_COMMUNITY)
Admission: RE | Disposition: A | Discharge status: Home / Self Care | Payer: Self-pay | Source: Home / Self Care | Attending: Cardiology

## 2020-03-16 DIAGNOSIS — E119 Type 2 diabetes mellitus without complications: Secondary | ICD-10-CM | POA: Diagnosis not present

## 2020-03-16 DIAGNOSIS — E069 Thyroiditis, unspecified: Secondary | ICD-10-CM | POA: Insufficient documentation

## 2020-03-16 DIAGNOSIS — E785 Hyperlipidemia, unspecified: Secondary | ICD-10-CM | POA: Diagnosis not present

## 2020-03-16 DIAGNOSIS — I25119 Atherosclerotic heart disease of native coronary artery with unspecified angina pectoris: Secondary | ICD-10-CM | POA: Insufficient documentation

## 2020-03-16 DIAGNOSIS — Z7982 Long term (current) use of aspirin: Secondary | ICD-10-CM | POA: Diagnosis not present

## 2020-03-16 DIAGNOSIS — Z6833 Body mass index (BMI) 33.0-33.9, adult: Secondary | ICD-10-CM | POA: Insufficient documentation

## 2020-03-16 DIAGNOSIS — R0609 Other forms of dyspnea: Secondary | ICD-10-CM | POA: Diagnosis not present

## 2020-03-16 DIAGNOSIS — R0600 Dyspnea, unspecified: Secondary | ICD-10-CM

## 2020-03-16 DIAGNOSIS — I209 Angina pectoris, unspecified: Secondary | ICD-10-CM | POA: Diagnosis not present

## 2020-03-16 DIAGNOSIS — Z9221 Personal history of antineoplastic chemotherapy: Secondary | ICD-10-CM | POA: Insufficient documentation

## 2020-03-16 HISTORY — PX: LEFT HEART CATH AND CORONARY ANGIOGRAPHY: CATH118249

## 2020-03-16 LAB — GLUCOSE, CAPILLARY: Glucose-Capillary: 162 mg/dL — ABNORMAL HIGH (ref 70–99)

## 2020-03-16 SURGERY — LEFT HEART CATH AND CORONARY ANGIOGRAPHY
Anesthesia: LOCAL

## 2020-03-16 MED ORDER — ONDANSETRON HCL 4 MG/2ML IJ SOLN: 4.0000 mg | Freq: Four times a day (QID) | INTRAMUSCULAR | Status: DC | PRN

## 2020-03-16 MED ORDER — HEPARIN SODIUM (PORCINE) 1000 UNIT/ML IJ SOLN
INTRAMUSCULAR | Status: DC | PRN
  Administered 2020-03-16: 4000 [IU] via INTRAVENOUS

## 2020-03-16 MED ORDER — SODIUM CHLORIDE 0.9% FLUSH: 3.0000 mL | INTRAVENOUS | Status: DC | PRN

## 2020-03-16 MED ORDER — HEPARIN (PORCINE) IN NACL 1000-0.9 UT/500ML-% IV SOLN
INTRAVENOUS | Status: DC | PRN
  Administered 2020-03-16 (×2): 500 mL

## 2020-03-16 MED ORDER — HYDRALAZINE HCL 20 MG/ML IJ SOLN: 10.0000 mg | INTRAMUSCULAR | Status: DC | PRN

## 2020-03-16 MED ORDER — ACETAMINOPHEN 325 MG PO TABS: 650.0000 mg | ORAL_TABLET | ORAL | Status: DC | PRN

## 2020-03-16 MED ORDER — HEPARIN (PORCINE) IN NACL 1000-0.9 UT/500ML-% IV SOLN
INTRAVENOUS | Status: AC
  Filled 2020-03-16: qty 500

## 2020-03-16 MED ORDER — SODIUM CHLORIDE 0.9 % IV SOLN: 250.0000 mL | INTRAVENOUS | Status: DC | PRN

## 2020-03-16 MED ORDER — SODIUM CHLORIDE 0.9 % WEIGHT BASED INFUSION
3.0000 mL/kg/h | INTRAVENOUS | Status: DC
  Administered 2020-03-16: 3 mL/kg/h via INTRAVENOUS

## 2020-03-16 MED ORDER — LABETALOL HCL 5 MG/ML IV SOLN: 10.0000 mg | INTRAVENOUS | Status: DC | PRN

## 2020-03-16 MED ORDER — SODIUM CHLORIDE 0.9% FLUSH: 3.0000 mL | Freq: Two times a day (BID) | INTRAVENOUS | Status: DC

## 2020-03-16 MED ORDER — SODIUM CHLORIDE 0.9% FLUSH
3.0000 mL | INTRAVENOUS | Status: DC | PRN
Start: 2020-03-16 — End: 2020-03-16

## 2020-03-16 MED ORDER — LIDOCAINE HCL (PF) 1 % IJ SOLN
INTRAMUSCULAR | Status: DC | PRN
  Administered 2020-03-16: 2 mL

## 2020-03-16 MED ORDER — LIDOCAINE HCL (PF) 1 % IJ SOLN
INTRAMUSCULAR | Status: AC
  Filled 2020-03-16: qty 30

## 2020-03-16 MED ORDER — SODIUM CHLORIDE 0.9 % IV SOLN: INTRAVENOUS | Status: DC

## 2020-03-16 MED ORDER — VERAPAMIL HCL 2.5 MG/ML IV SOLN
INTRAVENOUS | Status: DC | PRN
  Administered 2020-03-16: 5 mL via INTRA_ARTERIAL

## 2020-03-16 MED ORDER — FENTANYL CITRATE (PF) 100 MCG/2ML IJ SOLN
INTRAMUSCULAR | Status: DC | PRN
  Administered 2020-03-16: 25 ug via INTRAVENOUS

## 2020-03-16 MED ORDER — HEPARIN SODIUM (PORCINE) 1000 UNIT/ML IJ SOLN
INTRAMUSCULAR | Status: AC
  Filled 2020-03-16: qty 1

## 2020-03-16 MED ORDER — MIDAZOLAM HCL 2 MG/2ML IJ SOLN
INTRAMUSCULAR | Status: DC | PRN
  Administered 2020-03-16: 2 mg via INTRAVENOUS

## 2020-03-16 MED ORDER — ASPIRIN 81 MG PO CHEW
81.0000 mg | CHEWABLE_TABLET | ORAL | Status: AC
  Administered 2020-03-16: 81 mg via ORAL
  Filled 2020-03-16: qty 1

## 2020-03-16 MED ORDER — MIDAZOLAM HCL 2 MG/2ML IJ SOLN
INTRAMUSCULAR | Status: AC
  Filled 2020-03-16: qty 2

## 2020-03-16 MED ORDER — SODIUM CHLORIDE 0.9 % WEIGHT BASED INFUSION: 1.0000 mL/kg/h | INTRAVENOUS | Status: DC

## 2020-03-16 MED ORDER — FENTANYL CITRATE (PF) 100 MCG/2ML IJ SOLN
INTRAMUSCULAR | Status: AC
  Filled 2020-03-16: qty 2

## 2020-03-16 MED ORDER — IOHEXOL 350 MG/ML SOLN
INTRAVENOUS | Status: DC | PRN
  Administered 2020-03-16: 35 mL

## 2020-03-16 MED ORDER — VERAPAMIL HCL 2.5 MG/ML IV SOLN
INTRAVENOUS | Status: AC
  Filled 2020-03-16: qty 2

## 2020-03-16 SURGICAL SUPPLY — 9 items
CATH OPTITORQUE TIG 4.0 5F (CATHETERS) ×2 IMPLANT
DEVICE RAD COMP TR BAND LRG (VASCULAR PRODUCTS) ×2 IMPLANT
GLIDESHEATH SLEND A-KIT 6F 22G (SHEATH) ×2 IMPLANT
GUIDEWIRE INQWIRE 1.5J.035X260 (WIRE) ×1 IMPLANT
INQWIRE 1.5J .035X260CM (WIRE) ×2
KIT HEART LEFT (KITS) ×2 IMPLANT
PACK CARDIAC CATHETERIZATION (CUSTOM PROCEDURE TRAY) ×2 IMPLANT
TRANSDUCER W/STOPCOCK (MISCELLANEOUS) ×2 IMPLANT
TUBING CIL FLEX 10 FLL-RA (TUBING) ×2 IMPLANT

## 2020-03-16 NOTE — Discharge Instructions (Signed)
Radial Site Care  This sheet gives you information about how to care for yourself after your procedure. Your health care provider may also give you more specific instructions. If you have problems or questions, contact your health care provider. What can I expect after the procedure? After the procedure, it is common to have:  Bruising and tenderness at the catheter insertion area. Follow these instructions at home: Medicines  Take over-the-counter and prescription medicines only as told by your health care provider. Insertion site care  Follow instructions from your health care provider about how to take care of your insertion site. Make sure you: ? Wash your hands with soap and water before you change your bandage (dressing). If soap and water are not available, use hand sanitizer. ? Change your dressing as told by your health care provider. ? Leave stitches (sutures), skin glue, or adhesive strips in place. These skin closures may need to stay in place for 2 weeks or longer. If adhesive strip edges start to loosen and curl up, you may trim the loose edges. Do not remove adhesive strips completely unless your health care provider tells you to do that.  Check your insertion site every day for signs of infection. Check for: ? Redness, swelling, or pain. ? Fluid or blood. ? Pus or a bad smell. ? Warmth.  Do not take baths, swim, or use a hot tub until your health care provider approves.  You may shower 24-48 hours after the procedure, or as directed by your health care provider. ? Remove the dressing and gently wash the site with plain soap and water. ? Pat the area dry with a clean towel. ? Do not rub the site. That could cause bleeding.  Do not apply powder or lotion to the site. Activity  For 24 hours after the procedure, or as directed by your health care provider: ? Do not flex or bend the affected arm. ? Do not push or pull heavy objects with the affected arm. ? Do not drive  yourself home from the hospital or clinic. You may drive 24 hours after the procedure unless your health care provider tells you not to. ? Do not operate machinery or power tools.  Do not lift anything that is heavier than 10 lb (4.5 kg), or the limit that you are told, until your health care provider says that it is safe.  Ask your health care provider when it is okay to: ? Return to work or school. ? Resume usual physical activities or sports. ? Resume sexual activity.   General instructions  If the catheter site starts to bleed, raise your arm and put firm pressure on the site. If the bleeding does not stop, get help right away. This is a medical emergency.  If you went home on the same day as your procedure, a responsible adult should be with you for the first 24 hours after you arrive home.  Keep all follow-up visits as told by your health care provider. This is important. Contact a health care provider if:  You have a fever.  You have redness, swelling, or yellow drainage around your insertion site. Get help right away if:  You have unusual pain at the radial site.  The catheter insertion area swells very fast.  The insertion area is bleeding, and the bleeding does not stop when you hold steady pressure on the area.  Your arm or hand becomes pale, cool, tingly, or numb. These symptoms may represent a serious   problem that is an emergency. Do not wait to see if the symptoms will go away. Get medical help right away. Call your local emergency services (911 in the [place] Do not drive yourself to the hospital. Summary  After the procedure, it is common to have bruising and tenderness at the site.  Follow instructions from your health care provider about how to take care of your radial site wound. Check the wound every day for signs of infection.  Do not lift anything that is heavier than 10 lb (4.5 kg), or the limit that you are told, until your health care provider says that it  is safe. This information is not intended to replace advice given to you by your health care provider. Make sure you discuss any questions you have with your health care provider. Document Revised: 01/24/2017 Document Reviewed: 01/24/2017 Elsevier Patient Education  2021 [employer].  

## 2020-03-16 NOTE — Interval H&P Note (Signed)
History and Physical Interval Note:  03/16/2020 11:06 AM  Amy Houston  has presented today for surgery, with the diagnosis of angina - cad.  The various methods of treatment have been discussed with the patient and family. After consideration of risks, benefits and other options for treatment, the patient has consented to  Procedure(s): LEFT HEART CATH AND CORONARY ANGIOGRAPHY (N/A) and angioplasty as a surgical intervention.  The patient's history has been reviewed, patient examined, no change in status, stable for surgery.  I have reviewed the patient's chart and labs.  Questions were answered to the patient's satisfaction.   Cath Lab Visit (complete for each Cath Lab visit)  Clinical Evaluation Leading to the Procedure:   ACS: No.  Non-ACS:    Anginal Classification: CCS III  Anti-ischemic medical therapy: Maximal Therapy (2 or more classes of medications)  Non-Invasive Test Results: Intermediate-risk stress test findings: cardiac mortality 1-3%/year  Prior CABG: No previous CABG  Adrian Prows

## 2020-03-16 NOTE — Progress Notes (Signed)
Discharge instructions reviewed with patient and family. Verbalized understanding. 

## 2020-03-17 ENCOUNTER — Encounter (HOSPITAL_COMMUNITY): Payer: Self-pay | Admitting: Cardiology

## 2020-03-18 NOTE — Progress Notes (Signed)
Primary Physician/Referring:  Ailene Ards, NP  Patient ID: Amy Houston, female    DOB: 03-Mar-1945, 75 y.o.   MRN: [id number]  Chief Complaint  Patient presents with  . Coronary Artery Disease  . Hypertension  . Follow-up  . Hospitalization Follow-up   HPI:    Amy Houston  is a 75 y.o. History of hypertension, hyperlipidemia, diabetes mellitus, mild obesity, family history of multiple ovarian/uterine or colon cancer, has had bilateral salpingo-oophorectomy in 2015 and bilateral mastectomy in 2014 and underwent chemotherapy and chest RT for the same. She also has GERD.  Patient underwent nuclear stress test with intermediate risk and echocardiogram revealing LVEF 68% with grade 1 diastolic dysfunction mild MR.  Patient presents for follow-up after left heart catheterization, revealed small vessel disease, and medical therapy recommended.  Patient has been feeling well overall since left heart catheterization.  States she has only had 1 episode of chest pain, walking to her mailbox.  She reports associated mild dyspnea on exertion.  Her sister is present at bedside.  Patient denies palpitations, syncope, near syncope, leg swelling, orthopnea, PND.  Past Medical History:  Diagnosis Date  . Abnormal genetic test 04/09/2013   PALB2 and CHEK2 positivity   . Arthritis    "of the spine" (05/08/2012)  . Breast cancer ([place]) 1995   "left" (05/08/2012)  . Breast cancer, right breast, IDC, Multifocal 03/12/2012   Multifocal, 10 and 8 left breast   . Diabetes mellitus 1999   "dx; never on meds" (05/08/2012)  . Diabetes mellitus without complication ([place]) 03/08/1060  . Essential hypertension, benign 01/08/2019  . Exertional shortness of breath   . Family history of thyroid cancer   . GERD (gastroesophageal reflux disease)    tums  . H/O hiatal hernia   . Hemorrhoid   . History of blood transfusion    post child birth  . IBS (irritable bowel syndrome)   . Osteopenia 12/11/2012   On Ca++ and  Vit D. Bone density in April 2014.  Next bone density due in April 2016.  Marland Kitchen PONV (postoperative nausea and vomiting)   . PPD positive, treated 1999   Past Surgical History:  Procedure Laterality Date  . ABDOMINAL HYSTERECTOMY Bilateral 01/30/2013   Procedure: HYSTERECTOMY ABDOMINAL;  Surgeon: Melina Schools, MD;  Location: [facility];  Service: Gynecology;  Laterality: Bilateral;  . BREAST BIOPSY Left 1995  . BREAST BIOPSY Bilateral 03/2012   "one on the left; 2 on the right" (05/08/2012)  . BREAST LUMPECTOMY Left 1995  . CHOLECYSTECTOMY  1992  . COLONOSCOPY  12/06/93   Dr. Hayes:single diminutive polyp of the descending colon/extrernal hemorrhoids, benign path  . COLONOSCOPY  11/30/98   Dr Hayes:small rectal polyp/internal hemorrhoids, benign path  . COLONOSCOPY  11/26/2006   Dr. Hayes:sigmoid polyp/small interal hemorrhoids, adenomatous  . COLONOSCOPY N/A 03/30/2016   Dr. Oneida Alar: normal TI, 4 mm rectal polyp, multiple small and large-mouthed diverticula in rectosigmoid and sigmoid colon, non-bleeding internal hemorrhoids, tubular adenoma on path. surveillance in 5 yeras  . COLONOSCOPY WITH ESOPHAGOGASTRODUODENOSCOPY (EGD) N/A 07/01/2012   Dr. Oneida Alar: 2 small adenomas, sigmoid colon diverticula, surveillance in 2019  . DILATION AND CURETTAGE OF UTERUS  1968  . ESOPHAGEAL DILATION  07/01/2012   Dr. Oneida Alar: proximal esophageal web, Schatzki's ring at GE junction, s/p dilaiton. Mild non-erosive gastritis. Repeat EGD July 23, 2012.   . ESOPHAGOGASTRODUODENOSCOPY  1994   Dr. Cristina Gong: small hiatal hernia and Schatzki's ring widely patent, CLO test negative  .  ESOPHAGOGASTRODUODENOSCOPY (EGD) WITH ESOPHAGEAL DILATION N/A 07/23/2012   Dr. Fields:proximal esophageal web v. radiation induced stricture/schatzki ring/small HH, s/p dilation  . LEFT HEART CATH AND CORONARY ANGIOGRAPHY N/A 03/16/2020   Procedure: LEFT HEART CATH AND CORONARY ANGIOGRAPHY;  Surgeon: Adrian Prows, MD;  Location: [facility];   Service: Cardiovascular;  Laterality: N/A;  . MASTECTOMY COMPLETE / SIMPLE Left 05/08/2012  . MASTECTOMY COMPLETE / SIMPLE W/ SENTINEL NODE BIOPSY Right 05/08/2012  . SALPINGOOPHORECTOMY Bilateral 01/30/2013   Procedure: SALPINGO OOPHORECTOMY;  Surgeon: Melina Schools, MD;  Location: [facility];  Service: Gynecology;  Laterality: Bilateral;  . SIMPLE MASTECTOMY WITH AXILLARY SENTINEL NODE BIOPSY Right 05/08/2012   Procedure: RIGHT TOTAL MASTECTOMY WITH AXILLARY SENTINEL NODE BIOPSY;  Surgeon: Haywood Lasso, MD;  Location: [facility];  Service: General;  Laterality: Right;  . SIMPLE MASTECTOMY WITH AXILLARY SENTINEL NODE BIOPSY Left 05/08/2012   Procedure: LEFT TOTAL MASTECTOMY;  Surgeon: Haywood Lasso, MD;  Location: [facility] [facility];  Service: General;  Laterality: Left;  . TONSILLECTOMY AND ADENOIDECTOMY  ~ 2008  . TUBAL LIGATION  1976  . UVULOPALATOPHARYNGOPLASTY, TONSILLECTOMY AND SEPTOPLASTY  ~ 2008   Family History  Problem Relation Age of Onset  . Lung cancer Mother 37       smoker  . Leukemia Father 48       Hairy Cell at 65, CLL at 43  . Lymphoma Father 77       Hodgkins lymphoma  . Lung cancer Sister 32  . Thyroid cancer Brother 46       Medullary  . Lymphoma Brother 60       Follicular lymphoma  . Breast cancer Maternal Aunt        mother's paternal half sister  . Lung cancer Maternal Uncle        mother's paternal half brother  . Lung cancer Maternal Grandmother        smoker  . Other Daughter        leiomyoma of esophagus  . Colon cancer Maternal Aunt        mother's maternal half sister  . Cancer Paternal Uncle        oral cancer dx in his 68s  . Lung cancer Cousin        paternal cousin  . Breast cancer Cousin        paternal cousin died in her 62s    Social History   Tobacco Use  . Smoking status: Never Smoker  . Smokeless tobacco: Never Used  Substance Use Topics  . Alcohol use: Yes    Comment: 05/08/2012 "margarita or glass of wine once or twice/yr"   Marital Status:  Married  ROS  Review of Systems  Constitutional: Negative for malaise/fatigue and weight gain.  Cardiovascular: Positive for chest pain and dyspnea on exertion. Negative for claudication, leg swelling, near-syncope, orthopnea, palpitations, paroxysmal nocturnal dyspnea and syncope.  Hematologic/Lymphatic: Does not bruise/bleed easily.  Musculoskeletal: Positive for arthritis.  Gastrointestinal: Negative for melena.  Neurological: Negative for dizziness and weakness.   Objective  Blood pressure 132/78, pulse 67, temperature (!) 97.3 F (36.3 C), height 4' 10"  (1.473 m), weight 161 lb (73 kg), SpO2 99 %.  Vitals with BMI 03/23/2020 03/16/2020 03/16/2020  Height 4' 10"  - -  Weight 161 lbs - -  BMI 58.59 - -  Systolic 292 446 286  Diastolic 78 69 72  Pulse 67 73 74     Physical Exam Constitutional:  Comments: She is short stature and mildly obese in no acute distress.  Cardiovascular:     Rate and Rhythm: Normal rate and regular rhythm.     Pulses: Intact distal pulses.     Heart sounds: Normal heart sounds. No murmur heard. No gallop.      Comments: No leg edema, no JVD.  Radial access site well-healed, mild ecchymosis, no swelling/erythema/hematoma/bruit/drainage. Pulmonary:     Effort: Pulmonary effort is normal.     Breath sounds: Normal breath sounds.  Abdominal:     General: Bowel sounds are normal.     Palpations: Abdomen is soft.  Musculoskeletal:     Right lower leg: No edema.     Left lower leg: No edema.  Skin:    General: Skin is warm and dry.  Neurological:     General: No focal deficit present.     Mental Status: She is oriented to person, place, and time.  Psychiatric:        Mood and Affect: Mood normal.        Behavior: Behavior normal.    Laboratory examination:   Recent Labs    07/14/19 1101 09/04/19 1128 01/15/20 0946 01/27/20 1027 03/12/20 1046  NA 138 141 138 141 143  K 3.9 4.5 4.2 4.0 4.6  CL 104 106 106 107 106  CO2 23 24 24 23 20    GLUCOSE 208* 125* 174* 155* 154*  BUN 17 14 16 17 16   CREATININE 0.86 0.79 0.82 0.81 0.82  CALCIUM 9.7 8.9 9.5 9.4 9.4  GFRNONAA >60 74 >60 72  --   GFRAA >60 86  --  84  --    estimated creatinine clearance is 51.8 mL/min (by C-G formula based on SCr of 0.82 mg/dL).  CMP Latest Ref Rng & Units 03/12/2020 01/27/2020 01/15/2020  Glucose 65 - 99 mg/dL 154(H) 155(H) 174(H)  BUN 8 - 27 mg/dL 16 17 16   Creatinine 0.57 - 1.00 mg/dL 0.82 0.81 0.82  Sodium 134 - 144 mmol/L 143 141 138  Potassium 3.5 - 5.2 mmol/L 4.6 4.0 4.2  Chloride 96 - 106 mmol/L 106 107 106  CO2 20 - 29 mmol/L 20 23 24   Calcium 8.7 - 10.3 mg/dL 9.4 9.4 9.5  Total Protein 6.1 - 8.1 g/dL - 6.7 7.1  Total Bilirubin 0.2 - 1.2 mg/dL - 0.6 0.5  Alkaline Phos 38 - 126 U/L - - 30(L)  AST 10 - 35 U/L - 30 29  ALT 6 - 29 U/L - 36(H) 33   CBC Latest Ref Rng & Units 03/12/2020 01/15/2020 07/14/2019  WBC 3.4 - 10.8 x10E3/uL 7.1 5.5 6.8  Hemoglobin 11.1 - 15.9 g/dL 12.1 12.3 12.8  Hematocrit 34.0 - 46.6 % 36.8 37.8 39.9  Platelets 150 - 450 x10E3/uL 272 284 306    Lipid Panel Recent Labs    09/04/19 1128 01/27/20 1027 02/26/20 1200  CHOL 206* 188 138  TRIG 175* 169* 148  LDLCALC 130* 115* 71  HDL 45* 44* 41  CHOLHDL 4.6 4.3  --   LDLDIRECT  --   --  71    HEMOGLOBIN A1C Lab Results  Component Value Date   HGBA1C 7.1 (H) 01/27/2020   MPG 157 01/27/2020   TSH Recent Labs    09/04/19 1128  TSH 1.12    Medications and allergies   Allergies  Allergen Reactions  . Penicillins Rash    Has patient had a PCN reaction causing immediate rash, facial/tongue/throat swelling, SOB or lightheadedness with  hypotension: Yes Has patient had a PCN reaction causing severe rash involving mucus membranes or skin necrosis: No Has patient had a PCN reaction that required hospitalization No Has patient had a PCN reaction occurring within the last 10 years: No If all of the above answers are "NO", then may proceed with Cephalosporin  use.      Outpatient Medications Prior to Visit  Medication Sig Dispense Refill  . acetaminophen (TYLENOL) 500 MG tablet Take 1,000 mg by mouth daily as needed for moderate pain or headache.     Marland Kitchen aspirin (ASPIRIN CHILDRENS) 81 MG chewable tablet Chew 1 tablet (81 mg total) by mouth daily.    . calcium-vitamin D (OSCAL WITH D) 500-200 MG-UNIT tablet Take 1 tablet by mouth daily.     . cetirizine (ZYRTEC ALLERGY) 10 MG tablet Take 1 tablet (10 mg total) by mouth daily. (Patient taking differently: Take 10 mg by mouth daily as needed for allergies.) 30 tablet 0  . Cholecalciferol (VITAMIN D) 2000 units CAPS Take 6,000 Units by mouth at bedtime.     . cyanocobalamin 1000 MCG tablet Take 2,000 mcg by mouth at bedtime.    . fluticasone (FLONASE) 50 MCG/ACT nasal spray Place 1 spray into both nostrils daily as needed for allergies or rhinitis.    Marland Kitchen letrozole (FEMARA) 2.5 MG tablet TAKE (1) TABLET BY MOUTH ONCE DAILY. (Patient taking differently: Take 2.5 mg by mouth daily.) 30 tablet 6  . lisinopril (ZESTRIL) 20 MG tablet TAKE ONE TABLET BY MOUTH ONCE DAILY. 90 tablet 0  . Multiple Vitamin (MULTIVITAMIN WITH MINERALS) TABS Take 1 tablet by mouth at bedtime.     . nitroGLYCERIN (NITROSTAT) 0.4 MG SL tablet Place 0.4 mg under the tongue every 5 (five) minutes as needed for chest pain.    Marland Kitchen omeprazole (PRILOSEC) 20 MG capsule Take 1 capsule (20 mg total) by mouth daily. 90 capsule 0  . ondansetron (ZOFRAN ODT) 4 MG disintegrating tablet Take 1 tablet (4 mg total) by mouth every 8 (eight) hours as needed for nausea or vomiting. 20 tablet 0  . rosuvastatin (CRESTOR) 5 MG tablet Take 1 tablet (5 mg total) by mouth daily. 90 tablet 3  . venlafaxine XR (EFFEXOR-XR) 75 MG 24 hr capsule TAKE (1) CAPSULE BY MOUTH TWICE DAILY. (Patient taking differently: Take 75 mg by mouth in the morning and at bedtime.) 60 capsule 3  . metoprolol succinate (TOPROL-XL) 50 MG 24 hr tablet Take 1 tablet (50 mg total) by mouth  daily. (Patient taking differently: Take 75 mg by mouth daily.) 90 tablet 1  . ciprofloxacin (CIPRO) 500 MG tablet Take 500 mg by mouth 2 (two) times daily as needed (UTI). (Patient not taking: Reported on 03/23/2020)     No facility-administered medications prior to visit.    Radiology:   No results found.  Cardiac Studies:   Left Heart Catheterization 03/16/20:  LV: Normal size.  Hyperdynamic LVEF, >70%.  EDP mildly elevated at 14 mmHg.  No pressure gradient across the aortic valve. Left main: Large vessel.  Has mild calcification. LAD: Large caliber vessel.  Has minimal luminal irregularity.  Mild calcification is evident in the proximal and mid segment.  D1 is moderate-sized with again mild luminal irregularity. CX: Moderate calibered vessel, distal circumflex has mild ectasia, there is mild diffuse disease noted. RI: Small vessel, smooth and normal. RCA: Dominant vessel.  Ostium has 10 to 20% stenosis.  There is minimal diffuse disease in the right coronary artery.  Impression: Chest  pain and abnormal stress test is related to microvascular angina, patient with hyperdynamic LV, elevated blood pressure, hypertensive heart disease also can explain some of her symptoms and abnormal EKG response to exercise.  Patient was hypertensive during cardiac catheterization.  She also has small vessel disease that may explain her anginal symptoms.  Medical therapy for now with risk factor modification.  PCV ECHOCARDIOGRAM COMPLETE 02/23/2020 Left ventricle cavity is normal in size. Moderate concentric hypertrophy of the left ventricle. Normal global wall motion. Normal LV systolic function with EF 68%. Doppler evidence of grade I (impaired) diastolic dysfunction, normal LAP. Mild (Grade I) mitral regurgitation. No evidence of pulmonary hypertension.   PCV MYOCARDIAL PERFUSION WO LEXISCAN 02/23/2020 Exercise nuclear stress test was performed using Bruce protocol. Patient reached 7 METS, and 88% of  age predicted maximum heart rate. Exercise capacity was low. Non-limiting chest and back discomfort reported. Heart rate and hemodynamic response were normal. Peak EKG/ECG demonstrated sinus tachycardia, 1.5-2 mm horizontal/down-sloping ST depressions in leads II, III, aVF, V4-V6, persisting beyond 2 min into recovery. EKG changes are positive for ischemia. SPECT images show low ventricular volume, small sized, mild intensity, mildly reversible perfusion defect in basal inferoseptal, inferior myocardium. Stress LVEF 66%. Intermediate risk study.  EKG:   EKG 02/04/2020: Normal sinus rhythm at rate of 71 bpm, normal axis.  No evidence of ischemia, normal EKG.  No significant change from EKG 12/30/2018: Normal sinus rhythm.  Assessment     ICD-10-CM   1. Small vessel coronary artery disease  I25.10   2. Primary hypertension  I10 metoprolol succinate (TOPROL-XL) 100 MG 24 hr tablet  3. Hypercholesteremia  E78.00     Medications Discontinued During This Encounter  Medication Reason  . metoprolol succinate (TOPROL-XL) 50 MG 24 hr tablet Reorder    Meds ordered this encounter  Medications  . metoprolol succinate (TOPROL-XL) 100 MG 24 hr tablet    Sig: Take 1 tablet (100 mg total) by mouth daily.    Dispense:  90 tablet    Refill:  3   No orders of the defined types were placed in this encounter.  Recommendations:   Amy Houston is a 75 y.o. History of hypertension, hyperlipidemia, diabetes mellitus, mild obesity, family history of multiple ovarian/uterine or colon cancer, has had bilateral salpingo-oophorectomy in 2015 and bilateral mastectomy in 2014 and underwent chemotherapy for the same.  She also has GERD.  No referred to me by Dr. Anastasio Champion for evaluation of chest tightness.  Patient presents for follow up after left heart catheterization, which revealed small disease as likely underlying etiology of patient's exertional dyspnea and chest discomfort.  She has only had 1 episode  exertional symptoms since left heart catheterization.  She is presently tolerating guideline directed medical therapy without issue.  We will plan to continue aspirin, lisinopril, rosuvastatin, and as needed nitroglycerin.  She is also currently taking metoprolol succinate 75 mg once daily, shared decision was to increase to 100 mg once daily and attempt to improve patient's symptom control.  Advised patient to slowly increase her physical activity, as she has been relatively inactive due to concerns she may have significant underlying coronary artery disease.  However has left heart catheterization Will only small vessel disease I counseled her that she is safe to exercise on a regular basis.  Again advised patient regarding diet and lifestyle modifications in order to lose weight further reduce cardiovascular risk factors.  Recommend continued medical management and factor modification.  Lipids are well controlled, with  LDL of just 71.  Pressure was initially elevated in the office today, however it is well controlled upon recheck.  Advised patient to continue to monitor blood pressure on a daily basis at home.  Patient is otherwise stable from a cardiovascular standpoint.  Follow up in 6 months, sooner if needed, for hypertension, hyperlipidemia, and coronary artery disease.   Alethia Berthold, PA-C 03/23/2020, 12:00 PM Office: [phone]

## 2020-03-23 ENCOUNTER — Ambulatory Visit: Payer: PPO | Admitting: Student

## 2020-03-23 ENCOUNTER — Other Ambulatory Visit: Payer: Self-pay

## 2020-03-23 ENCOUNTER — Encounter: Payer: Self-pay | Admitting: Student

## 2020-03-23 VITALS — BP 132/78 | HR 67 | Temp 97.3°F | Ht <= 58 in | Wt 161.0 lb

## 2020-03-23 DIAGNOSIS — I10 Essential (primary) hypertension: Secondary | ICD-10-CM

## 2020-03-23 DIAGNOSIS — E7800 Pure hypercholesterolemia, unspecified: Secondary | ICD-10-CM | POA: Diagnosis not present

## 2020-03-23 DIAGNOSIS — I2510 Atherosclerotic heart disease of native coronary artery without angina pectoris: Secondary | ICD-10-CM | POA: Diagnosis not present

## 2020-03-23 MED ORDER — METOPROLOL SUCCINATE ER 100 MG PO TB24: 100.0000 mg | ORAL_TABLET | Freq: Every day | ORAL | 3 refills | Status: DC

## 2020-04-06 DIAGNOSIS — L820 Inflamed seborrheic keratosis: Secondary | ICD-10-CM | POA: Diagnosis not present

## 2020-04-06 DIAGNOSIS — L821 Other seborrheic keratosis: Secondary | ICD-10-CM | POA: Diagnosis not present

## 2020-04-29 ENCOUNTER — Other Ambulatory Visit: Payer: Self-pay

## 2020-04-29 ENCOUNTER — Ambulatory Visit (INDEPENDENT_AMBULATORY_CARE_PROVIDER_SITE_OTHER): Payer: PPO | Admitting: Nurse Practitioner

## 2020-04-29 ENCOUNTER — Encounter (INDEPENDENT_AMBULATORY_CARE_PROVIDER_SITE_OTHER): Payer: Self-pay | Admitting: Nurse Practitioner

## 2020-04-29 VITALS — BP 112/78 | HR 63 | Temp 97.2°F | Ht <= 58 in | Wt 162.0 lb

## 2020-04-29 DIAGNOSIS — I2510 Atherosclerotic heart disease of native coronary artery without angina pectoris: Secondary | ICD-10-CM | POA: Diagnosis not present

## 2020-04-29 DIAGNOSIS — Z23 Encounter for immunization: Secondary | ICD-10-CM

## 2020-04-29 DIAGNOSIS — E559 Vitamin D deficiency, unspecified: Secondary | ICD-10-CM | POA: Diagnosis not present

## 2020-04-29 DIAGNOSIS — I10 Essential (primary) hypertension: Secondary | ICD-10-CM

## 2020-04-29 DIAGNOSIS — E1165 Type 2 diabetes mellitus with hyperglycemia: Secondary | ICD-10-CM | POA: Diagnosis not present

## 2020-04-29 NOTE — Progress Notes (Signed)
Subjective:  Patient ID: Amy Houston, female    DOB: Aug 22, 1945  Age: 75 y.o. MRN: [id number]  CC:  Chief Complaint  Patient presents with  . Follow-up    Doing okay, patient has family in Hospice, has had a lot happen  . Coronary Artery Disease  . Hypertension  . Diabetes  . Other    Health Maintenance, vitamin D deficiency      HPI  This patient arrives today for the above.  Coronary artery disease: She was experiencing chest heaviness and fatigue with exertion.  She has been evaluated by cardiology and underwent cardiac catheterization which did show small vessel disease.  Since then she has been placed on aspirin, lisinopril, Crestor nitro as needed, and metoprolol was increased from 75 mg to 100 mg.  She tells me is tolerating all medication well and has not been experiencing quite as much chest heaviness or fatigue but she has not been exercising as much as she used to.  She plans on increasing her physical activity as tolerated as the weather improves.  Hypertension: She continues on lisinopril and metoprolol.  Diabetes: She is not on any meds currently to control her diabetes.  He tells me she is never been on meds nor would she like to start them if at all possible.  Last A1c was collected about 3 months ago and was 7.1.  She is on statin and ACE inhibitor.  She is negative for microalbuminuria within the last year.  She has had a foot exam within the last year as well.  She is not a candidate for GLP-1 agonist due to her family history of thyroid cancer.  Health maintenance: She is due for PPSV23 vaccine today.  Vitamin D deficiency: She continues on 6000 IUs of vitamin D3 daily.  Last serum check was approximately 7 months ago and it was 32.  Past Medical History:  Diagnosis Date  . Abnormal genetic test 04/09/2013   PALB2 and CHEK2 positivity   . Arthritis    "of the spine" (05/08/2012)  . Breast cancer ([place]) 1995   "left" (05/08/2012)  . Breast cancer, right  breast, IDC, Multifocal 03/12/2012   Multifocal, 10 and 8 left breast   . Diabetes mellitus 1999   "dx; never on meds" (05/08/2012)  . Diabetes mellitus without complication ([place]) 09/07/1882  . Essential hypertension, benign 01/08/2019  . Exertional shortness of breath   . Family history of thyroid cancer   . GERD (gastroesophageal reflux disease)    tums  . H/O hiatal hernia   . Hemorrhoid   . History of blood transfusion    post child birth  . IBS (irritable bowel syndrome)   . Osteopenia 12/11/2012   On Ca++ and Vit D. Bone density in April 2014.  Next bone density due in April 2016.  Marland Kitchen PONV (postoperative nausea and vomiting)   . PPD positive, treated 1999      Family History  Problem Relation Age of Onset  . Lung cancer Mother 13       smoker  . Leukemia Father 35       Hairy Cell at 60, CLL at 12  . Lymphoma Father 77       Hodgkins lymphoma  . Lung cancer Sister 42  . Thyroid cancer Brother 46       Medullary  . Lymphoma Brother 60       Follicular lymphoma  . Breast cancer Maternal Aunt  mother's paternal half sister  . Lung cancer Maternal Uncle        mother's paternal half brother  . Lung cancer Maternal Grandmother        smoker  . Other Daughter        leiomyoma of esophagus  . Colon cancer Maternal Aunt        mother's maternal half sister  . Cancer Paternal Uncle        oral cancer dx in his 34s  . Lung cancer Cousin        paternal cousin  . Breast cancer Cousin        paternal cousin died in her 58s    Social History   Social History Narrative   Married 23 yrs.Lives with wife .Retired.   Social History   Tobacco Use  . Smoking status: Never Smoker  . Smokeless tobacco: Never Used  Substance Use Topics  . Alcohol use: Yes    Comment: 05/08/2012 "margarita or glass of wine once or twice/yr"     Current Meds  Medication Sig  . acetaminophen (TYLENOL) 500 MG tablet Take 1,000 mg by mouth daily as needed for moderate pain or headache.    Marland Kitchen aspirin EC 81 MG tablet Take 81 mg by mouth daily. Swallow whole.  . calcium-vitamin D (OSCAL WITH D) 500-200 MG-UNIT tablet Take 1 tablet by mouth daily.   . cetirizine (ZYRTEC ALLERGY) 10 MG tablet Take 1 tablet (10 mg total) by mouth daily. (Patient taking differently: Take 10 mg by mouth daily as needed for allergies.)  . Cholecalciferol (VITAMIN D) 2000 units CAPS Take 6,000 Units by mouth at bedtime.   . cyanocobalamin 1000 MCG tablet Take 2,000 mcg by mouth at bedtime.  . fluticasone (FLONASE) 50 MCG/ACT nasal spray Place 1 spray into both nostrils daily as needed for allergies or rhinitis.  Marland Kitchen letrozole (FEMARA) 2.5 MG tablet TAKE (1) TABLET BY MOUTH ONCE DAILY. (Patient taking differently: Take 2.5 mg by mouth daily.)  . lisinopril (ZESTRIL) 20 MG tablet TAKE ONE TABLET BY MOUTH ONCE DAILY.  . metoprolol succinate (TOPROL-XL) 100 MG 24 hr tablet Take 1 tablet (100 mg total) by mouth daily.  . Multiple Vitamin (MULTIVITAMIN WITH MINERALS) TABS Take 1 tablet by mouth at bedtime.   . nitroGLYCERIN (NITROSTAT) 0.4 MG SL tablet Place 0.4 mg under the tongue every 5 (five) minutes as needed for chest pain.  Marland Kitchen omeprazole (PRILOSEC) 20 MG capsule Take 1 capsule (20 mg total) by mouth daily.  . ondansetron (ZOFRAN ODT) 4 MG disintegrating tablet Take 1 tablet (4 mg total) by mouth every 8 (eight) hours as needed for nausea or vomiting.  . rosuvastatin (CRESTOR) 5 MG tablet Take 1 tablet (5 mg total) by mouth daily.    ROS:  Review of Systems  Constitutional: Negative for malaise/fatigue.  Respiratory: Negative for shortness of breath.   Cardiovascular: Negative for chest pain.  Neurological: Negative for dizziness and headaches.     Objective:   Today's Vitals: BP 112/78   Pulse 63   Temp (!) 97.2 F (36.2 C) (Temporal)   Ht 4' 10"  (1.473 m)   Wt 162 lb (73.5 kg)   SpO2 97%   BMI 33.86 kg/m  Vitals with BMI 04/29/2020 03/23/2020 03/16/2020  Height 4' 10"  4' 10"  -  Weight 162 lbs  161 lbs -  BMI 57.32 20.25 -  Systolic 427 062 376  Diastolic 78 78 69  Pulse 63 67 73  Physical Exam Vitals reviewed.  Constitutional:      General: She is not in acute distress.    Appearance: Normal appearance.  HENT:     Head: Normocephalic and atraumatic.  Neck:     Vascular: No carotid bruit.  Cardiovascular:     Rate and Rhythm: Normal rate and regular rhythm.     Pulses: Normal pulses.     Heart sounds: Normal heart sounds.  Pulmonary:     Effort: Pulmonary effort is normal.     Breath sounds: Normal breath sounds.  Skin:    General: Skin is warm and dry.  Neurological:     General: No focal deficit present.     Mental Status: She is alert and oriented to person, place, and time.  Psychiatric:        Mood and Affect: Mood normal.        Behavior: Behavior normal.        Judgment: Judgment normal.          Assessment and Plan   1. Type 2 diabetes mellitus with hyperglycemia, without long-term current use of insulin ([place])   2. Vitamin D deficiency disease   3. Need for 23-polyvalent pneumococcal polysaccharide vaccine   4. Coronary artery disease involving native heart, unspecified vessel or lesion type, unspecified whether angina present   5. Essential hypertension, benign      Plan: 1.  We will check A1c today for further evaluation.  I did discuss possibly starting on medication such as metformin pending A1c results.  She is hopeful that she will not have to start the medication we will discuss this further pending A1c result. 2.  We will check vitamin D level today. 3.  We will administer PPSV23 vaccine.  We did discuss common potential side effects of medication as well as if she were to experience a allergic reaction with signs of this would be and that she should follow-up in the ER if this were to occur.  She tells me she understands. 4.  She will continue on her medication management and follow follow-up with cardiology as scheduled.  She was  encouraged to slowly increase her physical activity as tolerated. 5.  Blood pressure at goal should continue on her current medications as prescribed.   Tests ordered Orders Placed This Encounter  Procedures  . Pneumococcal polysaccharide vaccine 23-valent greater than or equal to 2yo subcutaneous/IM  . Hemoglobin A1c  . Vitamin D, 25-hydroxy  . CMP with eGFR([facility])      No orders of the defined types were placed in this encounter.   Patient to follow-up in 3 months or sooner as needed.  Ailene Ards, NP

## 2020-04-29 NOTE — Patient Instructions (Signed)
Pneumococcal Polysaccharide Vaccine (PPSV23): What You Need to Know 1. Why get vaccinated? Pneumococcal polysaccharide vaccine (PPSV23) can prevent pneumococcal disease. Pneumococcal disease refers to any illness caused by pneumococcal bacteria. These bacteria can cause many types of illnesses, including pneumonia, which is an infection of the lungs. Pneumococcal bacteria are one of the most common causes of pneumonia. Besides pneumonia, pneumococcal bacteria can also cause:  Ear infections  Sinus infections  Meningitis (infection of the tissue covering the brain and spinal cord)  Bacteremia (bloodstream infection) Anyone can get pneumococcal disease, but children under 2 years of age, people with certain medical conditions, adults 65 years or older, and cigarette smokers are at the highest risk. Most pneumococcal infections are mild. However, some can result in long-term problems, such as brain damage or hearing loss. Meningitis, bacteremia, and pneumonia caused by pneumococcal disease can be fatal. 2. PPSV23 PPSV23 protects against 23 types of bacteria that cause pneumococcal disease. PPSV23 is recommended for:  All adults 65 years or older,  Anyone 2 years or older with certain medical conditions that can lead to an increased risk for pneumococcal disease. Most people need only one dose of PPSV23. A second dose of PPSV23, and another type of pneumococcal vaccine called PCV13, are recommended for certain high-risk groups. Your health care provider can give you more information. People 65 years or older should get a dose of PPSV23 even if they have already gotten one or more doses of the vaccine before they turned 65. 3. Talk with your health care provider Tell your vaccine provider if the person getting the vaccine:  Has had an allergic reaction after a previous dose of PPSV23, or has any severe, life-threatening allergies. In some cases, your health care provider may decide to postpone  PPSV23 vaccination to a future visit. People with minor illnesses, such as a cold, may be vaccinated. People who are moderately or severely ill should usually wait until they recover before getting PPSV23. Your health care provider can give you more information. 4. Risks of a vaccine reaction  Redness or pain where the shot is given, feeling tired, fever, or muscle aches can happen after PPSV23. People sometimes faint after medical procedures, including vaccination. Tell your provider if you feel dizzy or have vision changes or ringing in the ears. As with any medicine, there is a very remote chance of a vaccine causing a severe allergic reaction, other serious injury, or death. 5. What if there is a serious problem? An allergic reaction could occur after the vaccinated person leaves the clinic. If you see signs of a severe allergic reaction (hives, swelling of the face and throat, difficulty breathing, a fast heartbeat, dizziness, or weakness), call 9-1-1 and get the person to the nearest hospital. For other signs that concern you, call your health care provider. Adverse reactions should be reported to the Vaccine Adverse Event Reporting System (VAERS). Your health care provider will usually file this report, or you can do it yourself. Visit the VAERS website at www.vaers.hhs.gov or call 1-800-822-7967. VAERS is only for reporting reactions, and VAERS staff do not give medical advice. 6. How can I learn more?  Ask your health care provider.  Call your local or state health department.  Contact the Centers for Disease Control and Prevention (CDC): ? Call 1-800-232-4636 (1-800-CDC-INFO) or ? Visit CDC's website at www.cdc.gov/vaccines Vaccine Information Statement PPSV23 Vaccine (10/31/2017) This information is not intended to replace advice given to you by your health care provider. Make sure you discuss   any questions you have with your health care provider. Document Revised: 08/22/2019  Document Reviewed: 08/22/2019 Elsevier Patient Education  2021 Elsevier Inc.   

## 2020-04-30 LAB — COMPLETE METABOLIC PANEL WITH GFR
AG Ratio: 1.8 (calc) (ref 1.0–2.5)
ALT: 29 U/L (ref 6–29)
AST: 28 U/L (ref 10–35)
Albumin: 4.7 g/dL (ref 3.6–5.1)
Alkaline phosphatase (APISO): 34 U/L — ABNORMAL LOW (ref 37–153)
BUN: 18 mg/dL (ref 7–25)
CO2: 26 mmol/L (ref 20–32)
Calcium: 9.9 mg/dL (ref 8.6–10.4)
Chloride: 104 mmol/L (ref 98–110)
Creat: 0.89 mg/dL (ref 0.60–0.93)
GFR, Est African American: 75 mL/min/{1.73_m2} (ref >=60)
GFR, Est Non African American: 64 mL/min/{1.73_m2} (ref >=60)
Globulin: 2.6 g/dL (calc) (ref 1.9–3.7)
Glucose, Bld: 177 mg/dL — ABNORMAL HIGH (ref 65–99)
Potassium: 4.5 mmol/L (ref 3.5–5.3)
Sodium: 141 mmol/L (ref 135–146)
Total Bilirubin: 0.6 mg/dL (ref 0.2–1.2)
Total Protein: 7.3 g/dL (ref 6.1–8.1)

## 2020-04-30 LAB — VITAMIN D 25 HYDROXY (VIT D DEFICIENCY, FRACTURES): Vit D, 25-Hydroxy: 51 ng/mL (ref 30–100)

## 2020-04-30 LAB — HEMOGLOBIN A1C
Hgb A1c MFr Bld: 7.3 % of total Hgb — ABNORMAL HIGH (ref <5.7)
Mean Plasma Glucose: 163 mg/dL
eAG (mmol/L): 9 mmol/L

## 2020-05-03 ENCOUNTER — Other Ambulatory Visit: Payer: Self-pay

## 2020-05-03 DIAGNOSIS — I10 Essential (primary) hypertension: Secondary | ICD-10-CM

## 2020-05-03 MED ORDER — METOPROLOL SUCCINATE ER 100 MG PO TB24: 100.0000 mg | ORAL_TABLET | Freq: Every day | ORAL | 3 refills | Status: DC

## 2020-05-07 ENCOUNTER — Other Ambulatory Visit: Payer: Self-pay | Admitting: Gastroenterology

## 2020-05-13 ENCOUNTER — Telehealth: Payer: Self-pay

## 2020-05-13 NOTE — Telephone Encounter (Signed)
Patient last seen 08/2017. I refused her refill the other day and let pharmacy know she needs appt first.

## 2020-05-13 NOTE — Telephone Encounter (Signed)
Pt is needing a refill for Omeprazole 20 mg cap., Qty 90 caps, Sig: take one cap po x once daily

## 2020-05-14 NOTE — Telephone Encounter (Signed)
Phoned the pt /pharmacy and advised the pt that appt was needed before any refills can be given. Pt agreed. Call was transferred to the front but will have to call pt back. (pt was ok with that)

## 2020-05-18 ENCOUNTER — Ambulatory Visit (INDEPENDENT_AMBULATORY_CARE_PROVIDER_SITE_OTHER): Payer: PPO | Admitting: Gastroenterology

## 2020-05-18 ENCOUNTER — Encounter: Payer: Self-pay | Admitting: Gastroenterology

## 2020-05-18 ENCOUNTER — Other Ambulatory Visit: Payer: Self-pay

## 2020-05-18 VITALS — BP 155/66 | HR 65 | Temp 96.9°F | Ht <= 58 in | Wt 164.0 lb

## 2020-05-18 DIAGNOSIS — K219 Gastro-esophageal reflux disease without esophagitis: Secondary | ICD-10-CM | POA: Diagnosis not present

## 2020-05-18 MED ORDER — OMEPRAZOLE 20 MG PO CPDR: 20.0000 mg | DELAYED_RELEASE_CAPSULE | Freq: Every day | ORAL | 3 refills | Status: DC

## 2020-05-18 NOTE — Progress Notes (Signed)
Primary Care Physician:  Ailene Ards, NP  Primary GI: Dr. Abbey Chatters, previously Dr. Oneida Alar   Chief Complaint  Patient presents with  . refill    Needs Omeprazole refill. Doing ok    HPI:   Amy MASSE is a 75 y.o. female presenting today with a history of diverticulitis in the past, GERD, adenomas with surveillance due in 2023, with need for office visit prior to further refills.   Cardiac cath recently. Started on 81 mg aspirin and statin. No abdominal pain, N/V. No dysphagia. No overt FI bleeding. No constipation. No GI concerns today. Needs refill on omeprazole. Planning a trip to the [facility] this summer.   Past Medical History:  Diagnosis Date  . Abnormal genetic test 04/09/2013   PALB2 and CHEK2 positivity   . Arthritis    "of the spine" (05/08/2012)  . Breast cancer ([place]) 1995   "left" (05/08/2012)  . Breast cancer, right breast, IDC, Multifocal 03/12/2012   Multifocal, 10 and 8 left breast   . Diabetes mellitus 1999   "dx; never on meds" (05/08/2012)  . Diabetes mellitus without complication ([place]) 01/08/735  . Essential hypertension, benign 01/08/2019  . Exertional shortness of breath   . Family history of thyroid cancer   . GERD (gastroesophageal reflux disease)    tums  . H/O hiatal hernia   . Hemorrhoid   . History of blood transfusion    post child birth  . IBS (irritable bowel syndrome)   . Osteopenia 12/11/2012   On Ca++ and Vit D. Bone density in April 2014.  Next bone density due in April 2016.  Marland Kitchen PONV (postoperative nausea and vomiting)   . PPD positive, treated 1999    Past Surgical History:  Procedure Laterality Date  . ABDOMINAL HYSTERECTOMY Bilateral 01/30/2013   Procedure: HYSTERECTOMY ABDOMINAL;  Surgeon: Melina Schools, MD;  Location: [facility];  Service: Gynecology;  Laterality: Bilateral;  . BREAST BIOPSY Left 1995  . BREAST BIOPSY Bilateral 03/2012   "one on the left; 2 on the right" (05/08/2012)  . BREAST LUMPECTOMY Left 1995  . CHOLECYSTECTOMY   1992  . COLONOSCOPY  12/06/93   Dr. Hayes:single diminutive polyp of the descending colon/extrernal hemorrhoids, benign path  . COLONOSCOPY  11/30/98   Dr Hayes:small rectal polyp/internal hemorrhoids, benign path  . COLONOSCOPY  11/26/2006   Dr. Hayes:sigmoid polyp/small interal hemorrhoids, adenomatous  . COLONOSCOPY N/A 03/30/2016   Dr. Oneida Alar: normal TI, 4 mm rectal polyp, multiple small and large-mouthed diverticula in rectosigmoid and sigmoid colon, non-bleeding internal hemorrhoids, tubular adenoma on path. surveillance in 5 yeras  . COLONOSCOPY WITH ESOPHAGOGASTRODUODENOSCOPY (EGD) N/A 07/01/2012   Dr. Oneida Alar: 2 small adenomas, sigmoid colon diverticula, surveillance in 2019  . DILATION AND CURETTAGE OF UTERUS  1968  . ESOPHAGEAL DILATION  07/01/2012   Dr. Oneida Alar: proximal esophageal web, Schatzki's ring at GE junction, s/p dilaiton. Mild non-erosive gastritis. Repeat EGD July 23, 2012.   . ESOPHAGOGASTRODUODENOSCOPY  1994   Dr. Cristina Gong: small hiatal hernia and Schatzki's ring widely patent, CLO test negative  . ESOPHAGOGASTRODUODENOSCOPY (EGD) WITH ESOPHAGEAL DILATION N/A 07/23/2012   Dr. Fields:proximal esophageal web v. radiation induced stricture/schatzki ring/small HH, s/p dilation  . LEFT HEART CATH AND CORONARY ANGIOGRAPHY N/A 03/16/2020   Procedure: LEFT HEART CATH AND CORONARY ANGIOGRAPHY;  Surgeon: Adrian Prows, MD;  Location: [facility];  Service: Cardiovascular;  Laterality: N/A;  . MASTECTOMY COMPLETE / SIMPLE Left 05/08/2012  . MASTECTOMY COMPLETE / SIMPLE W/  SENTINEL NODE BIOPSY Right 05/08/2012  . SALPINGOOPHORECTOMY Bilateral 01/30/2013   Procedure: SALPINGO OOPHORECTOMY;  Surgeon: Melina Schools, MD;  Location: [facility];  Service: Gynecology;  Laterality: Bilateral;  . SIMPLE MASTECTOMY WITH AXILLARY SENTINEL NODE BIOPSY Right 05/08/2012   Procedure: RIGHT TOTAL MASTECTOMY WITH AXILLARY SENTINEL NODE BIOPSY;  Surgeon: Haywood Lasso, MD;  Location: [facility];  Service:  General;  Laterality: Right;  . SIMPLE MASTECTOMY WITH AXILLARY SENTINEL NODE BIOPSY Left 05/08/2012   Procedure: LEFT TOTAL MASTECTOMY;  Surgeon: Haywood Lasso, MD;  Location: [facility];  Service: General;  Laterality: Left;  . TONSILLECTOMY AND ADENOIDECTOMY  ~ 2008  . TUBAL LIGATION  1976  . UVULOPALATOPHARYNGOPLASTY, TONSILLECTOMY AND SEPTOPLASTY  ~ 2008    Current Outpatient Medications  Medication Sig Dispense Refill  . acetaminophen (TYLENOL) 500 MG tablet Take 1,000 mg by mouth daily as needed for moderate pain or headache.     Marland Kitchen aspirin EC 81 MG tablet Take 81 mg by mouth daily. Swallow whole.    . calcium-vitamin D (OSCAL WITH D) 500-200 MG-UNIT tablet Take 1 tablet by mouth daily.     . cetirizine (ZYRTEC ALLERGY) 10 MG tablet Take 1 tablet (10 mg total) by mouth daily. (Patient taking differently: Take 10 mg by mouth daily as needed for allergies.) 30 tablet 0  . Cholecalciferol (VITAMIN D) 2000 units CAPS Take 6,000 Units by mouth at bedtime.     . cyanocobalamin 1000 MCG tablet Take 2,000 mcg by mouth at bedtime.    . fluticasone (FLONASE) 50 MCG/ACT nasal spray Place 1 spray into both nostrils daily as needed for allergies or rhinitis.    Marland Kitchen letrozole (FEMARA) 2.5 MG tablet TAKE (1) TABLET BY MOUTH ONCE DAILY. (Patient taking differently: Take 2.5 mg by mouth daily.) 30 tablet 6  . lisinopril (ZESTRIL) 20 MG tablet TAKE ONE TABLET BY MOUTH ONCE DAILY. 90 tablet 0  . metoprolol succinate (TOPROL-XL) 100 MG 24 hr tablet Take 1 tablet (100 mg total) by mouth daily. 90 tablet 3  . Multiple Vitamin (MULTIVITAMIN WITH MINERALS) TABS Take 1 tablet by mouth at bedtime.     . nitroGLYCERIN (NITROSTAT) 0.4 MG SL tablet Place 0.4 mg under the tongue every 5 (five) minutes as needed for chest pain.    Marland Kitchen omeprazole (PRILOSEC) 20 MG capsule Take 1 capsule (20 mg total) by mouth daily. 90 capsule 0  . ondansetron (ZOFRAN ODT) 4 MG disintegrating tablet Take 1 tablet (4 mg total) by mouth every 8  (eight) hours as needed for nausea or vomiting. 20 tablet 0  . rosuvastatin (CRESTOR) 5 MG tablet Take 1 tablet (5 mg total) by mouth daily. 90 tablet 3  . venlafaxine XR (EFFEXOR-XR) 75 MG 24 hr capsule TAKE (1) CAPSULE BY MOUTH TWICE DAILY. (Patient taking differently: Take 75 mg by mouth in the morning and at bedtime.) 60 capsule 3   No current facility-administered medications for this visit.    Allergies as of 05/18/2020 - Review Complete 05/18/2020  Allergen Reaction Noted  . Penicillins Rash 08/18/2010    Family History  Problem Relation Age of Onset  . Lung cancer Mother 77       smoker  . Leukemia Father 63       Hairy Cell at 49, CLL at 84  . Lymphoma Father 84       Hodgkins lymphoma  . Lung cancer Sister 77  . Thyroid cancer Brother 46       Medullary  .  Lymphoma Brother 60       Follicular lymphoma  . Breast cancer Maternal Aunt        mother's paternal half sister  . Lung cancer Maternal Uncle        mother's paternal half brother  . Lung cancer Maternal Grandmother        smoker  . Other Daughter        leiomyoma of esophagus  . Colon cancer Maternal Aunt        mother's maternal half sister  . Cancer Paternal Uncle        oral cancer dx in his 60s  . Lung cancer Cousin        paternal cousin  . Breast cancer Cousin        paternal cousin died in her 39s    Social History   Socioeconomic History  . Marital status: Married    Spouse name: Not on file  . Number of children: 2  . Years of education: 12th grade  . Highest education level: Not on file  Occupational History  . Occupation: retired    Fish farm manager: [employer]  Tobacco Use  . Smoking status: Never Smoker  . Smokeless tobacco: Never Used  Vaping Use  . Vaping Use: Never used  Substance and Sexual Activity  . Alcohol use: Not Currently    Comment: 05/08/2012 "margarita or glass of wine once or twice/yr"  . Drug use: No  . Sexual activity: Never  Other Topics Concern  . Not on  file  Social History Narrative   Married 45 yrs.Lives with wife .Retired.   Social Determinants of Health   Financial Resource Strain: Not on file  Food Insecurity: Not on file  Transportation Needs: Not on file  Physical Activity: Not on file  Stress: Not on file  Social Connections: Not on file    Review of Systems: Gen: Denies fever, chills, anorexia. Denies fatigue, weakness, weight loss.  CV: Denies chest pain, palpitations, syncope, peripheral edema, and claudication. Resp: Denies dyspnea at rest, cough, wheezing, coughing up blood, and pleurisy. GI: see HPI  Derm: Denies rash, itching, dry skin Psych: Denies depression, anxiety, memory loss, confusion. No homicidal or suicidal ideation.  Heme: Denies bruising, bleeding, and enlarged lymph nodes.  Physical Exam: BP (!) 155/66   Pulse 65   Temp (!) 96.9 F (36.1 C) (Temporal)   Ht 4' 10"  (1.473 m)   Wt 164 lb (74.4 kg)   BMI 34.28 kg/m  General:   Alert and oriented. No distress noted. Pleasant and cooperative.  Head:  Normocephalic and atraumatic. Eyes:  Conjuctiva clear without scleral icterus. Mouth:  Mask in place Abdomen:  +BS, soft, non-tender and non-distended. No rebound or guarding. No HSM or masses noted. Msk:  Symmetrical without gross deformities. Normal posture. Extremities:  Without edema. Neurologic:  Alert and  oriented x4 Psych:  Alert and cooperative. Normal mood and affect.  ASSESSMENT: Amy Houston is a 75 y.o. female presenting today with a history of diverticulitis in the past, GERD, adenomas with surveillance due in 2023, with need for office visit prior to further refills.   GERD remains well-controlled on omeprazole daily. She has no concerning lower or upper GI signs/symptoms. As she is doing well, we will see her back in 1 year.   Will arrange surveillance colonoscopy at that time as well, as she is due in 2023.   PLAN:  Refills provided for omeprazole X 1 year Return in 1  year Surveillance colonoscopy in 2023  Annitta Needs, PhD, ANP-BC [facility]

## 2020-05-18 NOTE — Progress Notes (Signed)
Cc'ed to pcp °

## 2020-05-18 NOTE — Patient Instructions (Signed)
I am glad you are doing well!  I have refilled omeprazole for you.  We will see you back in 1 year, and we can arrange a colonoscopy at that time.  Enjoy your trip!  I enjoyed seeing you again today! As you know, I value our relationship and want to provide genuine, compassionate, and quality care. I welcome your feedback. If you receive a survey regarding your visit,  I greatly appreciate you taking time to fill this out. See you next time!  Annitta Needs, PhD, ANP-BC [facility]

## 2020-06-14 ENCOUNTER — Other Ambulatory Visit (INDEPENDENT_AMBULATORY_CARE_PROVIDER_SITE_OTHER): Payer: Self-pay | Admitting: Internal Medicine

## 2020-06-28 ENCOUNTER — Other Ambulatory Visit (HOSPITAL_COMMUNITY): Payer: Self-pay

## 2020-06-28 DIAGNOSIS — M8580 Other specified disorders of bone density and structure, unspecified site: Secondary | ICD-10-CM

## 2020-06-28 DIAGNOSIS — Z170 Estrogen receptor positive status [ER+]: Secondary | ICD-10-CM

## 2020-06-28 NOTE — Progress Notes (Signed)
MRI and DEXA scan ordered per Dr. Delton Coombes

## 2020-07-15 ENCOUNTER — Other Ambulatory Visit (HOSPITAL_COMMUNITY): Payer: PPO

## 2020-07-15 ENCOUNTER — Other Ambulatory Visit (INDEPENDENT_AMBULATORY_CARE_PROVIDER_SITE_OTHER): Payer: Self-pay | Admitting: Internal Medicine

## 2020-07-15 ENCOUNTER — Ambulatory Visit (HOSPITAL_COMMUNITY): Payer: PPO

## 2020-07-15 ENCOUNTER — Ambulatory Visit (HOSPITAL_COMMUNITY): Payer: PPO | Admitting: Hematology

## 2020-08-09 ENCOUNTER — Other Ambulatory Visit: Payer: Self-pay

## 2020-08-09 ENCOUNTER — Ambulatory Visit (HOSPITAL_COMMUNITY): Payer: PPO

## 2020-08-09 ENCOUNTER — Ambulatory Visit (HOSPITAL_COMMUNITY)
Admission: RE | Admit: 2020-08-09 | Discharge: 2020-08-09 | Disposition: A | Discharge status: Home / Self Care | Payer: PPO | Source: Ambulatory Visit | Attending: Hematology | Admitting: Hematology

## 2020-08-09 ENCOUNTER — Other Ambulatory Visit (HOSPITAL_COMMUNITY): Payer: PPO

## 2020-08-09 ENCOUNTER — Inpatient Hospital Stay (HOSPITAL_COMMUNITY): Payer: PPO | Attending: Hematology

## 2020-08-09 DIAGNOSIS — M8580 Other specified disorders of bone density and structure, unspecified site: Secondary | ICD-10-CM

## 2020-08-09 DIAGNOSIS — Z90722 Acquired absence of ovaries, bilateral: Secondary | ICD-10-CM | POA: Diagnosis not present

## 2020-08-09 DIAGNOSIS — Z170 Estrogen receptor positive status [ER+]: Secondary | ICD-10-CM | POA: Insufficient documentation

## 2020-08-09 DIAGNOSIS — Z780 Asymptomatic menopausal state: Secondary | ICD-10-CM | POA: Insufficient documentation

## 2020-08-09 DIAGNOSIS — M85852 Other specified disorders of bone density and structure, left thigh: Secondary | ICD-10-CM | POA: Diagnosis not present

## 2020-08-09 DIAGNOSIS — C50111 Malignant neoplasm of central portion of right female breast: Secondary | ICD-10-CM

## 2020-08-09 DIAGNOSIS — Z853 Personal history of malignant neoplasm of breast: Secondary | ICD-10-CM | POA: Insufficient documentation

## 2020-08-09 DIAGNOSIS — R911 Solitary pulmonary nodule: Secondary | ICD-10-CM | POA: Insufficient documentation

## 2020-08-09 DIAGNOSIS — M8589 Other specified disorders of bone density and structure, multiple sites: Secondary | ICD-10-CM | POA: Insufficient documentation

## 2020-08-09 DIAGNOSIS — C50911 Malignant neoplasm of unspecified site of right female breast: Secondary | ICD-10-CM | POA: Diagnosis not present

## 2020-08-09 DIAGNOSIS — Z79811 Long term (current) use of aromatase inhibitors: Secondary | ICD-10-CM | POA: Insufficient documentation

## 2020-08-09 DIAGNOSIS — Z13820 Encounter for screening for osteoporosis: Secondary | ICD-10-CM | POA: Diagnosis not present

## 2020-08-09 DIAGNOSIS — N6489 Other specified disorders of breast: Secondary | ICD-10-CM | POA: Diagnosis not present

## 2020-08-09 DIAGNOSIS — Z9013 Acquired absence of bilateral breasts and nipples: Secondary | ICD-10-CM | POA: Insufficient documentation

## 2020-08-09 DIAGNOSIS — M85832 Other specified disorders of bone density and structure, left forearm: Secondary | ICD-10-CM | POA: Diagnosis not present

## 2020-08-09 DIAGNOSIS — Z90710 Acquired absence of both cervix and uterus: Secondary | ICD-10-CM | POA: Insufficient documentation

## 2020-08-09 LAB — CBC WITH DIFFERENTIAL/PLATELET
Abs Immature Granulocytes: 0.04 10*3/uL (ref 0.00–0.07)
Basophils Absolute: 0 10*3/uL (ref 0.0–0.1)
Basophils Relative: 1 %
Eosinophils Absolute: 0.1 10*3/uL (ref 0.0–0.5)
Eosinophils Relative: 1 %
HCT: 37.7 % (ref 36.0–46.0)
Hemoglobin: 12.3 g/dL (ref 12.0–15.0)
Immature Granulocytes: 1 %
Lymphocytes Relative: 31 %
Lymphs Abs: 2.3 10*3/uL (ref 0.7–4.0)
MCH: 32 pg (ref 26.0–34.0)
MCHC: 32.6 g/dL (ref 30.0–36.0)
MCV: 98.2 fL (ref 80.0–100.0)
Monocytes Absolute: 0.5 10*3/uL (ref 0.1–1.0)
Monocytes Relative: 6 %
Neutro Abs: 4.6 10*3/uL (ref 1.7–7.7)
Neutrophils Relative %: 60 %
Platelets: 287 10*3/uL (ref 150–400)
RBC: 3.84 MIL/uL — ABNORMAL LOW (ref 3.87–5.11)
RDW: 12.4 % (ref 11.5–15.5)
WBC: 7.5 10*3/uL (ref 4.0–10.5)
nRBC: 0 % (ref 0.0–0.2)

## 2020-08-09 LAB — COMPREHENSIVE METABOLIC PANEL
ALT: 31 U/L (ref 0–44)
AST: 27 U/L (ref 15–41)
Albumin: 4.1 g/dL (ref 3.5–5.0)
Alkaline Phosphatase: 38 U/L (ref 38–126)
Anion gap: 6 (ref 5–15)
BUN: 20 mg/dL (ref 8–23)
CO2: 25 mmol/L (ref 22–32)
Calcium: 9.6 mg/dL (ref 8.9–10.3)
Chloride: 107 mmol/L (ref 98–111)
Creatinine, Ser: 0.86 mg/dL (ref 0.44–1.00)
GFR, Estimated: >60 mL/min (ref >60)
Glucose, Bld: 189 mg/dL — ABNORMAL HIGH (ref 70–99)
Potassium: 4.5 mmol/L (ref 3.5–5.1)
Sodium: 138 mmol/L (ref 135–145)
Total Bilirubin: 0.7 mg/dL (ref 0.3–1.2)
Total Protein: 7.4 g/dL (ref 6.5–8.1)

## 2020-08-09 MED ORDER — GADOBUTROL 1 MMOL/ML IV SOLN
8.0000 mL | Freq: Once | INTRAVENOUS | Status: AC | PRN
  Administered 2020-08-09: 8 mL via INTRAVENOUS

## 2020-08-12 ENCOUNTER — Ambulatory Visit (HOSPITAL_COMMUNITY)
Admission: RE | Admit: 2020-08-12 | Discharge: 2020-08-12 | Disposition: A | Discharge status: Home / Self Care | Payer: PPO | Source: Ambulatory Visit | Attending: Nurse Practitioner | Admitting: Nurse Practitioner

## 2020-08-12 ENCOUNTER — Other Ambulatory Visit: Payer: Self-pay

## 2020-08-12 ENCOUNTER — Ambulatory Visit (HOSPITAL_COMMUNITY): Payer: PPO | Admitting: Hematology

## 2020-08-12 ENCOUNTER — Ambulatory Visit (INDEPENDENT_AMBULATORY_CARE_PROVIDER_SITE_OTHER): Payer: PPO | Admitting: Nurse Practitioner

## 2020-08-12 ENCOUNTER — Inpatient Hospital Stay (HOSPITAL_BASED_OUTPATIENT_CLINIC_OR_DEPARTMENT_OTHER): Payer: PPO | Admitting: Nurse Practitioner

## 2020-08-12 VITALS — BP 144/48 | HR 64 | Temp 97.4°F | Resp 18 | Wt 166.9 lb

## 2020-08-12 DIAGNOSIS — Z7983 Long term (current) use of bisphosphonates: Secondary | ICD-10-CM | POA: Diagnosis not present

## 2020-08-12 DIAGNOSIS — R6884 Jaw pain: Secondary | ICD-10-CM

## 2020-08-12 DIAGNOSIS — C50111 Malignant neoplasm of central portion of right female breast: Secondary | ICD-10-CM | POA: Insufficient documentation

## 2020-08-12 DIAGNOSIS — Z170 Estrogen receptor positive status [ER+]: Secondary | ICD-10-CM | POA: Insufficient documentation

## 2020-08-12 DIAGNOSIS — E041 Nontoxic single thyroid nodule: Secondary | ICD-10-CM | POA: Diagnosis not present

## 2020-08-12 DIAGNOSIS — M810 Age-related osteoporosis without current pathological fracture: Secondary | ICD-10-CM | POA: Diagnosis not present

## 2020-08-12 MED ORDER — IOHEXOL 300 MG/ML  SOLN
75.0000 mL | Freq: Once | INTRAMUSCULAR | Status: AC | PRN
  Administered 2020-08-12: 75 mL via INTRAVENOUS

## 2020-08-12 NOTE — Progress Notes (Signed)
[facility] [address] [place] [address]   Patient Care Team: Ailene Ards, NP as PCP - General (Nurse Practitioner) Everardo All, MD as Consulting Physician (Hematology and Oncology) Danie Binder, MD (Inactive) as Attending Physician (Gastroenterology) Eloise Harman, DO as Consulting Physician (Gastroenterology)  Virtual Visit Progress Note  I connected with Amy Houston on 08/12/20 at 10:20 AM EDT by video enabled telemedicine visit and verified that I am speaking with the correct person using two identifiers.   I discussed the limitations, risks, security and privacy concerns of performing an evaluation and management service by telemedicine and the availability of in-person appointments. I also discussed with the patient that there may be a patient responsible charge related to this service. The patient expressed understanding and agreed to proceed.   Other persons participating in the visit and their role in the encounter: Patient, sister, RN, NP   Patient's location: [facility]  Provider's location: [facility]     SUMMARY OF ONCOLOGIC HISTORY: Oncology History  Breast cancer, right breast, IDC, Multifocal  03/19/2012 Initial Diagnosis   Breast cancer, right breast, IDC, Multifocal   05/08/2012 Surgery   Bilateral simple mastectomies by Dr. Margot Chimes   06/03/2012 -  Chemotherapy   Aromatase inhibitor.  PALB2 and CHEK2+ and therefore would benefit from lifelong AI therapy.   01/30/2013 Surgery   Laparotomy, minimal lysis of adhesions, abdominal hysterectomy, bilateral salpingo-oophorectomy.     CHIEF COMPLIANT: Follow-up for breast cancer and osteoporosis on prolia   INTERVAL HISTORY: Amy Houston is a 75 y.o. female here today for follow up of her breast cancer and osteopenia. Had tooth pulled on right side. Complains of left sided jaw pain, radiates to her ear. She has xrays at her dentist which were negative. She was given a night guard  for possible clenching or grinding. Says pain is excruciating. Last prolia was January. She continues letrozole.     REVIEW OF SYSTEMS:   Review of Systems  Constitutional:  Negative for appetite change, fatigue and unexpected weight change.  HENT:   Negative for mouth sores, sore throat and trouble swallowing.   Respiratory:  Negative for chest tightness and shortness of breath.   Cardiovascular:  Negative for leg swelling.  Gastrointestinal:  Negative for abdominal pain, constipation, diarrhea, nausea and vomiting.  Genitourinary:  Negative for bladder incontinence and dysuria.   Musculoskeletal:  Negative for flank pain and neck stiffness.       Jaw pain left side  Skin:  Negative for itching, rash and wound.  Neurological:  Negative for dizziness, headaches, light-headedness and numbness.  Hematological:  Negative for adenopathy. Does not bruise/bleed easily.  Psychiatric/Behavioral:  Negative for confusion, depression and sleep disturbance. The patient is not nervous/anxious.     ALLERGIES:   is allergic to penicillins.   MEDICATIONS:  Current Outpatient Medications  Medication Sig Dispense Refill   acetaminophen (TYLENOL) 500 MG tablet Take 1,000 mg by mouth daily as needed for moderate pain or headache.      aspirin EC 81 MG tablet Take 81 mg by mouth daily. Swallow whole.     calcium-vitamin D (OSCAL WITH D) 500-200 MG-UNIT tablet Take 1 tablet by mouth daily.      cetirizine (ZYRTEC ALLERGY) 10 MG tablet Take 1 tablet (10 mg total) by mouth daily. (Patient taking differently: Take 10 mg by mouth daily as needed for allergies.) 30 tablet 0   Cholecalciferol (VITAMIN D) 2000 units CAPS  Take 6,000 Units by mouth at bedtime.      cyanocobalamin 1000 MCG tablet Take 2,000 mcg by mouth at bedtime.     fluticasone (FLONASE) 50 MCG/ACT nasal spray Place 1 spray into both nostrils daily as needed for allergies or rhinitis.     letrozole (FEMARA) 2.5 MG tablet TAKE (1) TABLET BY MOUTH  ONCE DAILY. (Patient taking differently: Take 2.5 mg by mouth daily.) 30 tablet 6   lisinopril (ZESTRIL) 20 MG tablet TAKE ONE TABLET BY MOUTH ONCE DAILY. 90 tablet 0   metoprolol succinate (TOPROL-XL) 100 MG 24 hr tablet Take 1 tablet (100 mg total) by mouth daily. 90 tablet 3   Multiple Vitamin (MULTIVITAMIN WITH MINERALS) TABS Take 1 tablet by mouth at bedtime.      nitroGLYCERIN (NITROSTAT) 0.4 MG SL tablet Place 0.4 mg under the tongue every 5 (five) minutes as needed for chest pain.     omeprazole (PRILOSEC) 20 MG capsule Take 1 capsule (20 mg total) by mouth daily. 90 capsule 3   ondansetron (ZOFRAN ODT) 4 MG disintegrating tablet Take 1 tablet (4 mg total) by mouth every 8 (eight) hours as needed for nausea or vomiting. 20 tablet 0   venlafaxine XR (EFFEXOR-XR) 75 MG 24 hr capsule TAKE (1) CAPSULE BY MOUTH TWICE DAILY. 60 capsule 3   rosuvastatin (CRESTOR) 5 MG tablet Take 1 tablet (5 mg total) by mouth daily. 90 tablet 3   No current facility-administered medications for this visit.   Family History  Problem Relation Age of Onset   Lung cancer Mother 73       smoker   Leukemia Father 7       Hairy Cell at 57, CLL at 12   Lymphoma Father 51       Hodgkins lymphoma   Lung cancer Sister 19   Thyroid cancer Brother 48       Medullary   Lymphoma Brother 40       Follicular lymphoma   Breast cancer Maternal Aunt        mother's paternal half sister   Lung cancer Maternal Uncle        mother's paternal half brother   Lung cancer Maternal Grandmother        smoker   Other Daughter        leiomyoma of esophagus   Colon cancer Maternal Aunt        mother's maternal half sister   Cancer Paternal Uncle        oral cancer dx in his 20s   Lung cancer Cousin        paternal cousin   Breast cancer Cousin        paternal cousin died in her 91s   Social History   Socioeconomic History   Marital status: Married    Spouse name: Not on file   Number of children: 2   Years of  education: 12th grade   Highest education level: Not on file  Occupational History   Occupation: retired    Fish farm manager: [employer]  Tobacco Use   Smoking status: Never   Smokeless tobacco: Never  Vaping Use   Vaping Use: Never used  Substance and Sexual Activity   Alcohol use: Not Currently    Comment: rare    Drug use: No   Sexual activity: Never  Other Topics Concern   Not on file  Social History Narrative   Married 67 yrs.Lives with wife .Retired.   Social Determinants  of Health   Financial Resource Strain: Not on file  Food Insecurity: Not on file  Transportation Needs: Not on file  Physical Activity: Not on file  Stress: Not on file  Social Connections: Not on file   Past Surgical History:  Procedure Laterality Date   ABDOMINAL HYSTERECTOMY Bilateral 01/30/2013   Procedure: HYSTERECTOMY ABDOMINAL;  Surgeon: Melina Schools, MD;  Location: [facility];  Service: Gynecology;  Laterality: Bilateral;   BREAST BIOPSY Left 1995   BREAST BIOPSY Bilateral 03/2012   "one on the left; 2 on the right" (05/08/2012)   [facility]   COLONOSCOPY  12/06/93   Dr. Hayes:single diminutive polyp of the descending colon/extrernal hemorrhoids, benign path   COLONOSCOPY  11/30/98   Dr Hayes:small rectal polyp/internal hemorrhoids, benign path   COLONOSCOPY  11/26/2006   Dr. Hayes:sigmoid polyp/small interal hemorrhoids, adenomatous   COLONOSCOPY N/A 03/30/2016   Dr. Oneida Alar: normal TI, 4 mm rectal polyp, multiple small and large-mouthed diverticula in rectosigmoid and sigmoid colon, non-bleeding internal hemorrhoids, tubular adenoma on path. surveillance in 5 yeras   COLONOSCOPY WITH ESOPHAGOGASTRODUODENOSCOPY (EGD) N/A 07/01/2012   Dr. Oneida Alar: 2 small adenomas, sigmoid colon diverticula, surveillance in 2019   [place]  07/01/2012   Dr. Oneida Alar: proximal esophageal web, Schatzki's ring at [employer] junction,  s/p dilaiton. Mild non-erosive gastritis. Repeat EGD July 23, 2012.    ESOPHAGOGASTRODUODENOSCOPY  1994   Dr. Cristina Gong: small hiatal hernia and Schatzki's ring widely patent, CLO test negative   ESOPHAGOGASTRODUODENOSCOPY (EGD) WITH ESOPHAGEAL DILATION N/A 07/23/2012   Dr. Fields:proximal esophageal web v. radiation induced stricture/schatzki ring/small HH, s/p dilation   LEFT HEART CATH AND CORONARY ANGIOGRAPHY N/A 03/16/2020   Procedure: LEFT HEART CATH AND CORONARY ANGIOGRAPHY;  Surgeon: Adrian Prows, MD;  Location: [facility];  Service: Cardiovascular;  Laterality: N/A;   MASTECTOMY COMPLETE / SIMPLE Left 05/08/2012   MASTECTOMY COMPLETE / SIMPLE W/ SENTINEL NODE BIOPSY Right 05/08/2012   SALPINGOOPHORECTOMY Bilateral 01/30/2013   Procedure: SALPINGO OOPHORECTOMY;  Surgeon: Melina Schools, MD;  Location: [facility];  Service: Gynecology;  Laterality: Bilateral;   SIMPLE MASTECTOMY WITH AXILLARY SENTINEL NODE BIOPSY Right 05/08/2012   Procedure: RIGHT TOTAL MASTECTOMY WITH AXILLARY SENTINEL NODE BIOPSY;  Surgeon: Haywood Lasso, MD;  Location: [facility];  Service: General;  Laterality: Right;   SIMPLE MASTECTOMY WITH AXILLARY SENTINEL NODE BIOPSY Left 05/08/2012   Procedure: LEFT TOTAL MASTECTOMY;  Surgeon: Haywood Lasso, MD;  Location: [facility];  Service: General;  Laterality: Left;   TONSILLECTOMY AND ADENOIDECTOMY  ~ 2008   TUBAL LIGATION  1976   UVULOPALATOPHARYNGOPLASTY, TONSILLECTOMY AND SEPTOPLASTY  ~ 2008     PHYSICAL EXAMINATION: Performance status (ECOG): 1 - Symptomatic but completely ambulatory  Vitals:   08/12/20 1015  BP: (!) 144/48  Pulse: 64  Resp: 18  Temp: (!) 97.4 F (36.3 C)  SpO2: 98%   Wt Readings from Last 3 Encounters:  08/12/20 166 lb 14.2 oz (75.7 kg)  05/18/20 164 lb (74.4 kg)  04/29/20 162 lb (73.5 kg)   Physical Exam Constitutional:      General: She is not in acute distress. HENT:     Head: Normocephalic.  Pulmonary:     Effort: No respiratory  distress.  Neurological:     Mental Status: She is alert and oriented to person, place, and time.  Psychiatric:  Mood and Affect: Mood normal.        Behavior: Behavior normal.    LABORATORY DATA:  I have reviewed the data as listed CMP Latest Ref Rng & Units 08/09/2020 04/29/2020 03/12/2020  Glucose 70 - 99 mg/dL 189(H) 177(H) 154(H)  BUN 8 - 23 mg/dL 20 18 16   Creatinine 0.44 - 1.00 mg/dL 0.86 0.89 0.82  Sodium 135 - 145 mmol/L 138 141 143  Potassium 3.5 - 5.1 mmol/L 4.5 4.5 4.6  Chloride 98 - 111 mmol/L 107 104 106  CO2 22 - 32 mmol/L 25 26 20   Calcium 8.9 - 10.3 mg/dL 9.6 9.9 9.4  Total Protein 6.5 - 8.1 g/dL 7.4 7.3 -  Total Bilirubin 0.3 - 1.2 mg/dL 0.7 0.6 -  Alkaline Phos 38 - 126 U/L 38 - -  AST 15 - 41 U/L 27 28 -  ALT 0 - 44 U/L 31 29 -   No results found for: FVC944 Lab Results  Component Value Date   WBC 7.5 08/09/2020   HGB 12.3 08/09/2020   HCT 37.7 08/09/2020   MCV 98.2 08/09/2020   PLT 287 08/09/2020   NEUTROABS 4.6 08/09/2020   Lab Results  Component Value Date   VD25OH 51 04/29/2020   VD25OH 32 09/04/2019   VD25OH 48 04/23/2019    ASSESSMENT:  1.  Stage Ia (T1CN0) right breast IDC: -1.2 cm, grade 2, ER/PR positive, HER-2 negative, positive LVI, Ki 67 of 60%, 0/4 lymph nodes involved, Oncotype DX 14. -Status post bilateral mastectomy on 05/08/2012, bilateral salpingo-oophorectomy on 01/30/2013, Aromasin started on 06/03/2012. -PALB 2 and CHEK2 mutation positive. -She was started on Aromasin, switched to Femara at this time. Continue through June 2024. Patient had previously discussed lifelong use of AI based on her above mutation status. Will defer to Dr. Raliegh Ip.  -MRI of the breast on 08/02/2018 was BI-RADS Category 1. - MRI 08/10/20 reported as bi-rads category 2: benign - Given her above genetic risk, recommend annual MRI breast. Repeat in 08/2021.   2.  Lung nodule: -CT chest on 06/11/2017 shows left upper lobe nodule. -CT chest on 07/09/2019 showed 3 mm  nodule in the posterior left upper lobe unchanged.  3 mm posterior right upper lobe nodule stable.  No new nodules or masses.  3.  Osteoporosis - DEXA scan on 04/20/14 shows t-score -2.4 - DEXA scan on 04/20/2016 shows T score -2.1. - DEXA scan on 08/02/2018 shows T score -1.9 - DEXA scan on 08/09/20 shows t score - 2.1 - Prolia started in 2016. - Continue prolia (see below)  4. Jaw pain - CT of jaw to rule out osteonecrosis of jaw. I independently reviewed imaging today which did not show evidence of osteonecrosis of the jaw or other acute pathology.  - Ok to continue prolia. Patient will call clinic to schedule  5.  Left breast cancer: - Diagnosed in 1996, underwent lumpectomy, CMF x8 cycles followed by radiation.  6.  Family history: - extensive family history of cancer. She personally has PALB2 and CHEK2 mutation.    PLAN:  1.  Stage Ia (T1CN0) right breast IDC: - Labs reviewed today - repeat breast mri in 1 year - continue letrozole - rtc in 1 year for labs, results, re-evaluation with Dr. Raliegh Ip.   2.  Lung nodule: -I reviewed results of the CT chest without contrast from 07/09/2019. -No new or progressive findings.  Tiny bilateral pulmonary nodules are unchanged in the interval of 2 years. -We will not do any  further scans.  3.  Osteoporosis - Calcium is within normal limits.  She will proceed with Prolia every 6 months.  4.  Left breast cancer: -Status post mastectomy.  No evidence of recurrence.  No orders of the defined types were placed in this encounter.   I discussed the assessment and treatment plan with the patient. The patient was provided an opportunity to ask questions and all were answered. The patient agreed with the plan and demonstrated an understanding of the instructions.   The patient was advised to call back or seek an in-person evaluation if the symptoms worsen or if the condition fails to improve as anticipated.   I spent 25 minutes face-to-face video  visit time dedicated to the care of this patient on the date of this encounter to include pre-visit review of history, oncology notes, labs, imaging, face-to-face time with the patient, and post visit ordering of testing/documentation.   Beckey Rutter, DNP, AGNP-C [place] [phone]

## 2020-08-12 NOTE — Progress Notes (Signed)
Patient is taking letrozole as prescribed.  They have not missed any doses and report no side effects at this time.

## 2020-08-12 NOTE — Patient Instructions (Addendum)
[place] at [facility] Discharge Instructions  You were seen today by Beckey Rutter, NP. She discussed your jaw pain and getting you set up for a CT scan of your jaw to evaluate this. Bone density scans are stable. T-score is staying around 2.1 and has improved since original bone density scan. Holding Prolia injection for now. Continue taking the Femara as prescribed. Lauren recommends you discuss your blood pressure staying low with your cardiologist.  Please follow up as scheduled.   Thank you for choosing [place] at [facility] to provide your oncology and hematology care.  To afford each patient quality time with our provider, please arrive at least 15 minutes before your scheduled appointment time.   If you have a lab appointment with the [facility] please come in thru the Main Entrance and check in at the main information desk.  You need to re-schedule your appointment should you arrive 10 or more minutes late.  We strive to give you quality time with our providers, and arriving late affects you and other patients whose appointments are after yours.  Also, if you no show three or more times for appointments you may be dismissed from the clinic at the providers discretion.     Again, thank you for choosing [facility].  Our hope is that these requests will decrease the amount of time that you wait before being seen by our physicians.       _____________________________________________________________  Should you have questions after your visit to [facility], please contact our office at [phone] and follow the prompts.  Our office hours are 8:00 a.m. and 4:30 p.m. Monday - Friday.  Please note that voicemails left after 4:00 p.m. may not be returned until the following business day.  We are closed weekends and major holidays.  You do have access to a nurse 24-7, just call the main number to the clinic  [phone] and do not press any options, hold on the line and a nurse will answer the phone.    For prescription refill requests, have your pharmacy contact our office and allow 72 hours.    Due to Covid, you will need to wear a mask upon entering the hospital. If you do not have a mask, a mask will be given to you at the Main Entrance upon arrival. For doctor visits, patients may have 1 support person age 53 or older with them. For treatment visits, patients can not have anyone with them due to social distancing guidelines and our immunocompromised population.

## 2020-08-16 ENCOUNTER — Ambulatory Visit (INDEPENDENT_AMBULATORY_CARE_PROVIDER_SITE_OTHER): Payer: PPO | Admitting: Internal Medicine

## 2020-08-18 ENCOUNTER — Ambulatory Visit (HOSPITAL_COMMUNITY): Payer: PPO | Admitting: Hematology

## 2020-08-25 ENCOUNTER — Ambulatory Visit (INDEPENDENT_AMBULATORY_CARE_PROVIDER_SITE_OTHER): Payer: PPO | Admitting: Nurse Practitioner

## 2020-09-24 ENCOUNTER — Ambulatory Visit: Payer: PPO | Admitting: Student

## 2020-09-24 ENCOUNTER — Ambulatory Visit: Payer: PPO | Admitting: Cardiology

## 2020-10-01 ENCOUNTER — Encounter: Payer: Self-pay | Admitting: Cardiology

## 2020-10-01 ENCOUNTER — Other Ambulatory Visit: Payer: Self-pay

## 2020-10-01 ENCOUNTER — Ambulatory Visit: Payer: PPO | Admitting: Cardiology

## 2020-10-01 VITALS — BP 136/53 | HR 64 | Temp 97.7°F | Resp 16 | Ht <= 58 in | Wt 163.2 lb

## 2020-10-01 DIAGNOSIS — I10 Essential (primary) hypertension: Secondary | ICD-10-CM | POA: Diagnosis not present

## 2020-10-01 DIAGNOSIS — I209 Angina pectoris, unspecified: Secondary | ICD-10-CM | POA: Diagnosis not present

## 2020-10-01 DIAGNOSIS — E7800 Pure hypercholesterolemia, unspecified: Secondary | ICD-10-CM

## 2020-10-01 NOTE — Progress Notes (Signed)
Primary Physician/Referring:  Ailene Ards, NP  Patient ID: Amy Houston, female    DOB: May 24, 1945, 75 y.o.   MRN: [id number]  Chief Complaint  Patient presents with   Coronary Artery Disease   Hypertension   Hyperlipidemia   Follow-up    6 months   HPI:    Amy Houston  is a 75 y.o. History of hypertension, hyperlipidemia, diabetes mellitus, mild obesity, family history of multiple ovarian/uterine or colon cancer, has had bilateral salpingo-oophorectomy in 2015 and bilateral mastectomy in 2014 and underwent chemotherapy for the same.  She also has GERD.  Due to abnormal nuclear stress test and abnormal EKG response to stress test, chest pain clearly indicated of angina pectoris, she underwent cardiac catheterization on 03/16/2020 revealing hyperdynamic LVEF, no significant coronary artery disease but did find mild ectasia and mild luminal irregularity.  She continues to have occasional episodes of exertional chest pain which is nitrate responsive.  This is a 74-monthoffice visit.  Past Medical History:  Diagnosis Date   Abnormal genetic test 04/09/2013   PALB2 and CHEK2 positivity    Arthritis    "of the spine" (05/08/2012)   [place]   "left" (05/08/2012)   Breast cancer, right breast, IDC, Multifocal 03/12/2012   Multifocal, 10 and 8 left breast    Diabetes mellitus 1999   "dx; never on meds" (05/08/2012)   Diabetes mellitus without complication ([place] 14/08/5460  Essential hypertension, benign 01/08/2019   Exertional shortness of breath    Family history of thyroid cancer    GERD (gastroesophageal reflux disease)    tums   H/O hiatal hernia    Hemorrhoid    History of blood transfusion    post child birth   IBS (irritable bowel syndrome)    Osteopenia 12/11/2012   On Ca++ and Vit D. Bone density in April 2014.  Next bone density due in April 2016.   PONV (postoperative nausea and vomiting)    PPD positive, treated 1999   Past Surgical History:  Procedure  Laterality Date   ABDOMINAL HYSTERECTOMY Bilateral 01/30/2013   Procedure: HYSTERECTOMY ABDOMINAL;  Surgeon: TMelina Schools MD;  [facility];  Service: Gynecology;  Laterality: Bilateral;   BREAST BIOPSY Left 1995   BREAST BIOPSY Bilateral 03/2012   "one on the left; 2 on the right" (05/08/2012)   [place]  COLONOSCOPY  12/06/93   Dr. Hayes:single diminutive polyp of the descending colon/extrernal hemorrhoids, benign path   COLONOSCOPY  11/30/98   Dr Hayes:small rectal polyp/internal hemorrhoids, benign path   COLONOSCOPY  11/26/2006   Dr. Hayes:sigmoid polyp/small interal hemorrhoids, adenomatous   COLONOSCOPY N/A 03/30/2016   Dr. FOneida Alar normal TI, 4 mm rectal polyp, multiple small and large-mouthed diverticula in rectosigmoid and sigmoid colon, non-bleeding internal hemorrhoids, tubular adenoma on path. surveillance in 5 yeras   COLONOSCOPY WITH ESOPHAGOGASTRODUODENOSCOPY (EGD) N/A 07/01/2012   Dr. FOneida Alar 2 small adenomas, sigmoid colon diverticula, surveillance in 2019   [place] 07/01/2012   Dr. FOneida Alar proximal esophageal web, Schatzki's ring at [facility], s/p dilaiton. Mild non-erosive gastritis. Repeat EGD July 23, 2012.    ESOPHAGOGASTRODUODENOSCOPY  1994   Dr. BCristina Gong small hiatal hernia and Schatzki's ring widely patent, CLO test negative   ESOPHAGOGASTRODUODENOSCOPY (EGD) WITH ESOPHAGEAL DILATION N/A 07/23/2012   Dr. Fields:proximal esophageal web v. radiation induced stricture/schatzki ring/small HH, s/p dilation  LEFT HEART CATH AND CORONARY ANGIOGRAPHY N/A 03/16/2020   Procedure: LEFT HEART CATH AND CORONARY ANGIOGRAPHY;  Surgeon: Adrian Prows, MD;  Location: [facility];  Service: Cardiovascular;  Laterality: N/A;   MASTECTOMY COMPLETE / SIMPLE Left 05/08/2012   MASTECTOMY COMPLETE / SIMPLE W/ SENTINEL NODE BIOPSY Right 05/08/2012   SALPINGOOPHORECTOMY Bilateral 01/30/2013    Procedure: SALPINGO OOPHORECTOMY;  Surgeon: Melina Schools, MD;  Location: [facility];  Service: Gynecology;  Laterality: Bilateral;   SIMPLE MASTECTOMY WITH AXILLARY SENTINEL NODE BIOPSY Right 05/08/2012   Procedure: RIGHT TOTAL MASTECTOMY WITH AXILLARY SENTINEL NODE BIOPSY;  Surgeon: Haywood Lasso, MD;  Location: [facility];  Service: General;  Laterality: Right;   SIMPLE MASTECTOMY WITH AXILLARY SENTINEL NODE BIOPSY Left 05/08/2012   Procedure: LEFT TOTAL MASTECTOMY;  Surgeon: Haywood Lasso, MD;  Location: [facility];  Service: General;  Laterality: Left;   TONSILLECTOMY AND ADENOIDECTOMY  ~ 2008   TUBAL LIGATION  1976   UVULOPALATOPHARYNGOPLASTY, TONSILLECTOMY AND SEPTOPLASTY  ~ 2008   Family History  Problem Relation Age of Onset   Lung cancer Mother 35       smoker   Leukemia Father 22       Hairy Cell at 10, CLL at 30   Lymphoma Father 29       Hodgkins lymphoma   Lung cancer Sister 5   Thyroid cancer Brother 68       Medullary   Lymphoma Brother 19       Follicular lymphoma   Breast cancer Maternal Aunt        mother's paternal half sister   Lung cancer Maternal Uncle        mother's paternal half brother   Lung cancer Maternal Grandmother        smoker   Other Daughter        leiomyoma of esophagus   Colon cancer Maternal Aunt        mother's maternal half sister   Cancer Paternal Uncle        oral cancer dx in his 42s   Lung cancer Cousin        paternal cousin   Breast cancer Cousin        paternal cousin died in her 45s    Social History   Tobacco Use   Smoking status: Never   Smokeless tobacco: Never  Substance Use Topics   Alcohol use: Not Currently    Comment: rare    Marital Status: Married  ROS  Review of Systems  Cardiovascular:  Positive for chest pain. Negative for dyspnea on exertion and leg swelling.  Musculoskeletal:  Positive for arthritis.  Gastrointestinal:  Negative for melena.  Objective  Blood pressure (!) 136/53, pulse 64, temperature  97.7 F (36.5 C), temperature source Temporal, resp. rate 16, height 4' 10"  (1.473 m), weight 163 lb 3.2 oz (74 kg), SpO2 99 %.  Vitals with BMI 10/01/2020 08/12/2020 05/18/2020  Height 4' 10"  - 4' 10"   Weight 163 lbs 3 oz 166 lbs 14 oz 164 lbs  BMI 14.78 - 29.56  Systolic 213 086 578  Diastolic 53 48 66  Pulse 64 64 65     Physical Exam Constitutional:      Comments: She is short stature and mildly obese in no acute distress.  Cardiovascular:     Rate and Rhythm: Normal rate and regular rhythm.     Pulses: Intact distal pulses.     Heart sounds: Normal heart sounds.  No murmur heard.   No gallop.     Comments: No leg edema, no JVD. Pulmonary:     Effort: Pulmonary effort is normal.     Breath sounds: Normal breath sounds.  Abdominal:     General: Bowel sounds are normal.     Palpations: Abdomen is soft.   Laboratory examination:   Recent Labs    01/27/20 1027 03/12/20 1046 04/29/20 0919 08/09/20 1006  NA 141 143 141 138  K 4.0 4.6 4.5 4.5  CL 107 106 104 107  CO2 23 20 26 25   GLUCOSE 155* 154* 177* 189*  BUN 17 16 18 20   CREATININE 0.81 0.82 0.89 0.86  CALCIUM 9.4 9.4 9.9 9.6  GFRNONAA 72  --  64 >60  GFRAA 84  --  75  --    CrCl cannot be calculated (Patient's most recent lab result is older than the maximum 21 days allowed.).  CMP Latest Ref Rng & Units 08/09/2020 04/29/2020 03/12/2020  Glucose 70 - 99 mg/dL 189(H) 177(H) 154(H)  BUN 8 - 23 mg/dL 20 18 16   Creatinine 0.44 - 1.00 mg/dL 0.86 0.89 0.82  Sodium 135 - 145 mmol/L 138 141 143  Potassium 3.5 - 5.1 mmol/L 4.5 4.5 4.6  Chloride 98 - 111 mmol/L 107 104 106  CO2 22 - 32 mmol/L 25 26 20   Calcium 8.9 - 10.3 mg/dL 9.6 9.9 9.4  Total Protein 6.5 - 8.1 g/dL 7.4 7.3 -  Total Bilirubin 0.3 - 1.2 mg/dL 0.7 0.6 -  Alkaline Phos 38 - 126 U/L 38 - -  AST 15 - 41 U/L 27 28 -  ALT 0 - 44 U/L 31 29 -   CBC Latest Ref Rng & Units 08/09/2020 03/12/2020 01/15/2020  WBC 4.0 - 10.5 K/uL 7.5 7.1 5.5  Hemoglobin 12.0 - 15.0 g/dL  12.3 12.1 12.3  Hematocrit 36.0 - 46.0 % 37.7 36.8 37.8  Platelets 150 - 400 K/uL 287 272 284    Lipid Panel Recent Labs    01/27/20 1027 02/26/20 1200  CHOL 188 138  TRIG 169* 148  LDLCALC 115* 71  HDL 44* 41  CHOLHDL 4.3  --   LDLDIRECT  --  71    HEMOGLOBIN A1C Lab Results  Component Value Date   HGBA1C 7.3 (H) 04/29/2020   MPG 163 04/29/2020   TSH No results for input(s): TSH in the last 8760 hours.   Medications and allergies   Allergies  Allergen Reactions   Penicillins Rash    Has patient had a PCN reaction causing immediate rash, facial/tongue/throat swelling, SOB or lightheadedness with hypotension: Yes Has patient had a PCN reaction causing severe rash involving mucus membranes or skin necrosis: No Has patient had a PCN reaction that required hospitalization No Has patient had a PCN reaction occurring within the last 10 years: No If all of the above answers are "NO", then may proceed with Cephalosporin use.      Outpatient Medications Prior to Visit  Medication Sig Dispense Refill   acetaminophen (TYLENOL) 500 MG tablet Take 1,000 mg by mouth daily as needed for moderate pain or headache.      aspirin EC 81 MG tablet Take 81 mg by mouth daily. Swallow whole.     calcium-vitamin D (OSCAL WITH D) 500-200 MG-UNIT tablet Take 1 tablet by mouth daily.      cetirizine (ZYRTEC ALLERGY) 10 MG tablet Take 1 tablet (10 mg total) by mouth daily. (Patient taking differently: Take 10 mg by  mouth daily as needed for allergies.) 30 tablet 0   Cholecalciferol (VITAMIN D) 2000 units CAPS Take 6,000 Units by mouth at bedtime.      cyanocobalamin 1000 MCG tablet Take 2,000 mcg by mouth at bedtime.     fluticasone (FLONASE) 50 MCG/ACT nasal spray Place 1 spray into both nostrils daily as needed for allergies or rhinitis.     letrozole (FEMARA) 2.5 MG tablet TAKE (1) TABLET BY MOUTH ONCE DAILY. (Patient taking differently: Take 2.5 mg by mouth daily.) 30 tablet 6   lisinopril  (ZESTRIL) 20 MG tablet TAKE ONE TABLET BY MOUTH ONCE DAILY. 90 tablet 0   metoprolol succinate (TOPROL-XL) 100 MG 24 hr tablet Take 1 tablet (100 mg total) by mouth daily. 90 tablet 3   Multiple Vitamin (MULTIVITAMIN WITH MINERALS) TABS Take 1 tablet by mouth at bedtime.      nitroGLYCERIN (NITROSTAT) 0.4 MG SL tablet Place 0.4 mg under the tongue every 5 (five) minutes as needed for chest pain.     omeprazole (PRILOSEC) 20 MG capsule Take 1 capsule (20 mg total) by mouth daily. 90 capsule 3   ondansetron (ZOFRAN ODT) 4 MG disintegrating tablet Take 1 tablet (4 mg total) by mouth every 8 (eight) hours as needed for nausea or vomiting. 20 tablet 0   rosuvastatin (CRESTOR) 5 MG tablet Take 1 tablet (5 mg total) by mouth daily. 90 tablet 3   venlafaxine XR (EFFEXOR-XR) 75 MG 24 hr capsule TAKE (1) CAPSULE BY MOUTH TWICE DAILY. 60 capsule 3   No facility-administered medications prior to visit.    Radiology:   No results found.  Cardiac Studies:   PCV MYOCARDIAL PERFUSION WO LEXISCAN 02/23/2020  Narrative Exercise Myoview stress test 02/23/2020: Exercise nuclear stress test was performed using Bruce protocol. Patient reached 7 METS, and 88% of age predicted maximum heart rate. Exercise capacity was low. Non-limiting chest and back discomfort reported. Heart rate and hemodynamic response were normal. Peak EKG/ECG demonstrated sinus tachycardia, 1.5-2 mm horizontal/down-sloping ST depressions in leads II, III, aVF, V4-V6, persisting beyond 2 min into recovery. EKG changes are positive for ischemia. SPECT images show low ventricular volume, small sized, mild intensity, mildly reversible perfusion defect in basal inferoseptal, inferior myocardium. Stress LVEF 66%. Intermediate risk study.  Left Heart Catheterization 03/16/20:  LV: Normal size.  Hyperdynamic LVEF, >70%.  EDP mildly elevated at 14 mmHg.  No pressure gradient across the aortic valve. Left main: Large vessel.  Has mild  calcification. LAD: Large caliber vessel.  Has minimal luminal irregularity.  Mild calcification is evident in the proximal and mid segment.  D1 is moderate-sized with again mild luminal irregularity. CX: Moderate calibered vessel, distal circumflex has mild ectasia, there is mild diffuse disease noted. RI: Small vessel, smooth and normal. RCA: Dominant vessel.  Ostium has 10 to 20% stenosis.  There is minimal diffuse disease in the right coronary artery.   Impression: Chest pain and abnormal stress test is related to microvascular angina, patient with hyperdynamic LV, elevated blood pressure, hypertensive heart disease also can explain some of her symptoms and abnormal EKG response to exercise.  Patient was hypertensive during cardiac catheterization.  She also has small vessel disease that may explain her anginal symptoms.  Medical therapy for now with risk factor modification.   EKG:   EKG 10/02/2018: Normal sinus rhythm at rate of 62 bpm, normal axis, no evidence of ischemia, normal EKG. no significant change from 02/14/2020.    Assessment     ICD-10-CM  1. Angina pectoris (HCC)  I20.9     2. Primary hypertension  I10 EKG 12-Lead    3. Hypercholesteremia  E78.00       There are no discontinued medications.   No orders of the defined types were placed in this encounter.  Orders Placed This Encounter  Procedures   EKG 12-Lead   Recommendations:   Amy Houston is a 75 y.o. History of hypertension, hyperlipidemia, diabetes mellitus, mild obesity, family history of multiple ovarian/uterine or colon cancer, has had bilateral salpingo-oophorectomy in 2015 and bilateral mastectomy in 2014 and underwent chemotherapy for the same.  She also has GERD.  Due to abnormal nuclear stress test and abnormal EKG response to stress test, chest pain clearly indicated of angina pectoris, she underwent cardiac catheterization on 03/16/2020 revealing hyperdynamic LVEF, no significant coronary artery  disease but did find mild ectasia and mild luminal irregularity.  She continues to have occasional episodes of exertional chest pain which is nitrate responsive.  This is a 45-monthoffice visit.  No change in physical exam, EKG remains unchanged.  Blood pressure is well controlled and lipids are also at goal.  Weight loss again discussed with the patient, regular exercise discussed with the patient, external labs reviewed.  I would like to see her back in 1 year for follow-up of angina pectoris.   JAdrian Prows MD, F[facility]9/30/2022, 11:04 PM Office: [phone]

## 2020-11-04 ENCOUNTER — Other Ambulatory Visit: Payer: Self-pay | Admitting: Cardiology

## 2020-11-04 DIAGNOSIS — E7800 Pure hypercholesterolemia, unspecified: Secondary | ICD-10-CM

## 2020-12-08 ENCOUNTER — Ambulatory Visit: Payer: PPO | Admitting: Nurse Practitioner

## 2020-12-10 ENCOUNTER — Ambulatory Visit: Payer: PPO | Admitting: Nurse Practitioner

## 2020-12-16 ENCOUNTER — Other Ambulatory Visit (INDEPENDENT_AMBULATORY_CARE_PROVIDER_SITE_OTHER): Payer: Self-pay | Admitting: Nurse Practitioner

## 2020-12-16 ENCOUNTER — Other Ambulatory Visit: Payer: Self-pay

## 2020-12-16 ENCOUNTER — Encounter: Payer: Self-pay | Admitting: Nurse Practitioner

## 2020-12-16 ENCOUNTER — Ambulatory Visit (INDEPENDENT_AMBULATORY_CARE_PROVIDER_SITE_OTHER): Payer: PPO | Admitting: Nurse Practitioner

## 2020-12-16 VITALS — BP 170/80 | HR 87 | Ht <= 58 in | Wt 165.0 lb

## 2020-12-16 DIAGNOSIS — K921 Melena: Secondary | ICD-10-CM | POA: Diagnosis not present

## 2020-12-16 DIAGNOSIS — M8580 Other specified disorders of bone density and structure, unspecified site: Secondary | ICD-10-CM | POA: Diagnosis not present

## 2020-12-16 DIAGNOSIS — E559 Vitamin D deficiency, unspecified: Secondary | ICD-10-CM

## 2020-12-16 DIAGNOSIS — I10 Essential (primary) hypertension: Secondary | ICD-10-CM

## 2020-12-16 DIAGNOSIS — K219 Gastro-esophageal reflux disease without esophagitis: Secondary | ICD-10-CM | POA: Diagnosis not present

## 2020-12-16 DIAGNOSIS — Z6833 Body mass index (BMI) 33.0-33.9, adult: Secondary | ICD-10-CM | POA: Diagnosis not present

## 2020-12-16 DIAGNOSIS — E041 Nontoxic single thyroid nodule: Secondary | ICD-10-CM | POA: Diagnosis not present

## 2020-12-16 DIAGNOSIS — E785 Hyperlipidemia, unspecified: Secondary | ICD-10-CM | POA: Diagnosis not present

## 2020-12-16 DIAGNOSIS — E669 Obesity, unspecified: Secondary | ICD-10-CM | POA: Diagnosis not present

## 2020-12-16 DIAGNOSIS — E119 Type 2 diabetes mellitus without complications: Secondary | ICD-10-CM | POA: Diagnosis not present

## 2020-12-16 DIAGNOSIS — Z23 Encounter for immunization: Secondary | ICD-10-CM | POA: Diagnosis not present

## 2020-12-16 NOTE — Assessment & Plan Note (Signed)
DASH diet and commitment to daily physical activity for a minimum of 30 minutes discussed and encouraged, as a part of hypertension management. The importance of attaining a healthy weight is also discussed.  BP/Weight 12/16/2020 10/01/2020 08/12/2020 05/18/2020 04/29/2020 03/23/2020 02/03/5425  Systolic BP 062 376 283 151 761 607 371  Diastolic BP 80 53 48 66 78 78 69  Wt. (Lbs) 165 163.2 166.89 164 162 161 160  BMI 34.49 34.11 34.88 34.28 33.86 33.65 33.44   She has not taken her blood pressure meds today. Measure BP at home and call the office if systolic BP is higher than 150.

## 2020-12-16 NOTE — Assessment & Plan Note (Signed)
Omeprazole 20mg  daily.

## 2020-12-16 NOTE — Patient Instructions (Addendum)
Please get your labs done  Please get your diabetic eye exam done Get your shingles vaccine, COVID booster.  It is important that you exercise regularly at least 30 minutes 5 times a week.  Think about what you will eat, plan ahead. Choose " clean, green, fresh or frozen" over canned, processed or packaged foods which are more sugary, salty and fatty. 70 to 75% of food eaten should be vegetables and fruit. Three meals at set times with snacks allowed between meals, but they must be fruit or vegetables. Aim to eat over a 12 hour period , example 7 am to 7 pm, and STOP after  your last meal of the day. Drink water,generally about 64 ounces per day, no other drink is as healthy. Fruit juice is best enjoyed in a healthy way, by EATING the fruit.  Thanks for choosing [facility], we consider it a privelige to serve you.

## 2020-12-16 NOTE — Assessment & Plan Note (Signed)
Managed by oncolgy.  Takes Ca and vit. D

## 2020-12-16 NOTE — Assessment & Plan Note (Signed)
Check TSH. Pt stated that the nodule was benign when found years ago.

## 2020-12-16 NOTE — Progress Notes (Signed)
° °  Amy Houston     MRN: [id number]      DOB: [birth year]   HPI Amy Houston is here to establish care. Previous patient of Dr Fredric Mare. Due for yearly eye exam, shingles vaccine, COVID booster. Past Medical History and Family Medical History updated.  Right and left Breast CA. Has had double mastectomy with chemotherapy, followed by Dr. Delton Coombes. She has MRI yearly. Takes femara 2.5mg  daily.  Adenoma, Diverticulitis, GERD : Takes omeprazole. Followed by Roseanne Kaufman at [facility]. Surveillance colonoscopy due in 2023.   Hypertension: Metoprolol 100 mg daily, lisinopril 20mg  daily.   Hyperlipidemia, CAD , Anginal Pectoris: Followed by Dr Einar Gip, takes Aspirin 81mg  daily, crestor 5mg  daily, metoprolol 100mg  daily.   Osteopenia; She stopped taking prolia injection because she was having pain in her jaw, takes Ca with Vit D. Condition managed by oncology.   Diabetes: Not on medications  Vitamin D deficiency : Takes vitamin D 6000 units daily.     ROS Denies recent fever or chills. Denies sinus pressure, nasal congestion, ear pain or sore throat. Denies chest congestion, productive cough or wheezing. Has chest heaviness Denies chest pains, palpitations and leg swelling Denies abdominal pain, nausea, vomiting,diarrhea or constipation, has bloody stool.  Denies dysuria, frequency, hesitancy or incontinence. Denies joint pain, swelling and limitation in mobility. Denies headaches, seizures, numbness, or tingling. Denies depression, anxiety or insomnia. Denies skin break down or rash.   PE  BP (!) 170/80    Pulse 87    Ht 4\' 10"  (1.473 m)    Wt 165 lb (74.8 kg)    SpO2 96%    BMI 34.49 kg/m   Patient alert and oriented and in no cardiopulmonary distress.  HEENT: No facial asymmetry, EOMI,     Neck supple .  Chest: Clear to auscultation bilaterally.  CVS: S1, S2 no murmurs, no S3.Regular rate.  ABD: Soft non tender.   Ext: No edema  MS: Adequate ROM  spine, shoulders, hips and knees.  Skin: Intact, no ulcerations or rash noted.  Psych: Good eye contact, normal affect. Memory intact not anxious or depressed appearing.  CNS: CN 2-12 intact, power,  normal throughout.no focal deficits noted.   Assessment & Plan

## 2020-12-16 NOTE — Assessment & Plan Note (Signed)
Continue vitamin D 6000 units daily.

## 2020-12-16 NOTE — Assessment & Plan Note (Signed)
Importance of healthy food choices with portion control discussed as well as eating regularly within 12  hour window.  ° °The need to choose clean green food 50%-75% of time is discussed as well as make water the primary drink and set a goal for 64 ounces daily. ° °Patient reeducated about the importance of committment to minimum of 150 minutes of exercise per week. ° °Three meals at set times with snacks allowed between meals but they must be fruit or vegetable.  ° °Aim to eat  over 12 hour period  for example 7 am to 7 pm. Stop after your last meal of the day.  °

## 2020-12-16 NOTE — Assessment & Plan Note (Addendum)
Lab Results  Component Value Date   HGBA1C 7.3 (H) 04/29/2020  not on medications, A1C today. Agree to starting medications if labs remains elevated. importance of low carb diet and exercise discussed with pt.

## 2020-12-16 NOTE — Assessment & Plan Note (Signed)
Chronic condition per pt, stated that she has hemorrhoids. CBC is stable. Did discuss  with former PCP Dr Oneida Alar.. No other GI symptoms, followed by GI has  surveillance colonoscopy coming up next year, due to having adenoma.

## 2020-12-16 NOTE — Assessment & Plan Note (Signed)
Continue crestor 20mg  . Lipid panel today

## 2020-12-17 ENCOUNTER — Other Ambulatory Visit: Payer: Self-pay | Admitting: Nurse Practitioner

## 2020-12-17 ENCOUNTER — Encounter: Payer: Self-pay | Admitting: Nurse Practitioner

## 2020-12-17 DIAGNOSIS — E119 Type 2 diabetes mellitus without complications: Secondary | ICD-10-CM

## 2020-12-17 DIAGNOSIS — E785 Hyperlipidemia, unspecified: Secondary | ICD-10-CM

## 2020-12-17 LAB — CMP14+EGFR
ALT: 29 IU/L (ref 0–32)
AST: 25 IU/L (ref 0–40)
Albumin/Globulin Ratio: 1.8 (ref 1.2–2.2)
Albumin: 4.6 g/dL (ref 3.7–4.7)
Alkaline Phosphatase: 83 IU/L (ref 44–121)
BUN/Creatinine Ratio: 20 (ref 12–28)
BUN: 17 mg/dL (ref 8–27)
Bilirubin Total: 0.5 mg/dL (ref 0.0–1.2)
CO2: 20 mmol/L (ref 20–29)
Calcium: 10.2 mg/dL (ref 8.7–10.3)
Chloride: 103 mmol/L (ref 96–106)
Creatinine, Ser: 0.87 mg/dL (ref 0.57–1.00)
Globulin, Total: 2.6 g/dL (ref 1.5–4.5)
Glucose: 163 mg/dL — ABNORMAL HIGH (ref 70–99)
Potassium: 4.5 mmol/L (ref 3.5–5.2)
Sodium: 140 mmol/L (ref 134–144)
Total Protein: 7.2 g/dL (ref 6.0–8.5)
eGFR: 70 mL/min/{1.73_m2} (ref >59)

## 2020-12-17 LAB — LIPID PANEL
Chol/HDL Ratio: 4.7 ratio — ABNORMAL HIGH (ref 0.0–4.4)
Cholesterol, Total: 199 mg/dL (ref 100–199)
HDL: 42 mg/dL (ref >39)
LDL Chol Calc (NIH): 130 mg/dL — ABNORMAL HIGH (ref 0–99)
Triglycerides: 152 mg/dL — ABNORMAL HIGH (ref 0–149)
VLDL Cholesterol Cal: 27 mg/dL (ref 5–40)

## 2020-12-17 LAB — HEMOGLOBIN A1C
Est. average glucose Bld gHb Est-mCnc: 174 mg/dL
Hgb A1c MFr Bld: 7.7 % — ABNORMAL HIGH (ref 4.8–5.6)

## 2020-12-17 LAB — TSH: TSH: 1.22 u[IU]/mL (ref 0.450–4.500)

## 2020-12-17 MED ORDER — EMPAGLIFLOZIN 10 MG PO TABS: 10.0000 mg | ORAL_TABLET | Freq: Every day | ORAL | 3 refills | Status: DC

## 2020-12-17 NOTE — Telephone Encounter (Signed)
Amy Houston's  daughters can check with their doctors but I do not think it is ok for them to get the injection due to their family history of medullary thyroid cancer. Thanks

## 2020-12-24 ENCOUNTER — Other Ambulatory Visit: Payer: Self-pay | Admitting: Family Medicine

## 2020-12-24 ENCOUNTER — Telehealth: Payer: Self-pay

## 2020-12-24 MED ORDER — VENLAFAXINE HCL ER 75 MG PO CP24: ORAL_CAPSULE | ORAL | 3 refills | Status: DC

## 2020-12-24 NOTE — Telephone Encounter (Signed)
Patient called said [place] has the prescription Venlafaxine XR 75 mg is pending at the pharmacy and needs this prescription today. Asked if nurse will please give her a call back at [phone].

## 2020-12-24 NOTE — Telephone Encounter (Signed)
venlafaxine XR (EFFEXOR-XR) 75 MG 24 hr capsule (Expired)   [phone]

## 2021-01-04 ENCOUNTER — Encounter: Payer: Self-pay | Admitting: *Deleted

## 2021-01-06 ENCOUNTER — Other Ambulatory Visit: Payer: Self-pay

## 2021-01-06 ENCOUNTER — Ambulatory Visit (INDEPENDENT_AMBULATORY_CARE_PROVIDER_SITE_OTHER): Payer: PPO

## 2021-01-06 DIAGNOSIS — Z0000 Encounter for general adult medical examination without abnormal findings: Secondary | ICD-10-CM

## 2021-01-06 NOTE — Progress Notes (Signed)
Subjective:   Amy Houston is a 76 y.o. female who presents for an Initial Medicare Annual Wellness Visit. I connected with  [name] on 01/06/21 by a audio enabled telemedicine application and verified that I am speaking with the correct person using two identifiers.  Patient Location: Home  Provider Location: Office/Clinic  I discussed the limitations of evaluation and management by telemedicine. The patient expressed understanding and agreed to proceed.  Review of Systems    Defer to PCP Cardiac Risk Factors include: advanced age (>20mn, >>43women);hypertension     Objective:    Today's Vitals   01/06/21 1111  PainSc: 0-No pain   There is no height or weight on file to calculate BMI.  Advanced Directives 01/06/2021 08/12/2020 03/16/2020 01/15/2020 07/14/2019 07/10/2018 07/04/2018  Does Patient Have a Medical Advance Directive? Yes Yes Yes No Yes Yes Yes  Type of AParamedicof [place]ower [place]Living will [place] will [place] will - Healthcare Power of [place] Does patient want to make changes to medical advance directive? - No - Patient declined - - No - Patient declined - No - Patient declined  Copy of [place] Chart? No - copy requested No - copy requested - - No - copy requested - Yes - validated most recent copy scanned in chart (See row information)  Would patient like information on creating a medical advance directive? No - Patient declined - - No - Patient declined - - -  Pre-existing out of facility DNR order (yellow form or pink MOST form) - - - - - - -    Current Medications (verified) Outpatient Encounter Medications as of 01/06/2021  Medication Sig   acetaminophen (TYLENOL) 500 MG tablet Take 1,000 mg by mouth daily as needed for moderate pain or headache.    aspirin EC 81 MG tablet Take 81 mg by mouth daily. Swallow whole.    calcium-vitamin D (OSCAL WITH D) 500-200 MG-UNIT tablet Take 1 tablet by mouth daily.    cetirizine (ZYRTEC ALLERGY) 10 MG tablet Take 1 tablet (10 mg total) by mouth daily. (Patient taking differently: Take 10 mg by mouth daily as needed for allergies.)   Cholecalciferol (VITAMIN D) 2000 units CAPS Take 6,000 Units by mouth at bedtime.    cyanocobalamin 1000 MCG tablet Take 2,000 mcg by mouth at bedtime.   empagliflozin (JARDIANCE) 10 MG TABS tablet Take 1 tablet (10 mg total) by mouth daily before breakfast.   fluticasone (FLONASE) 50 MCG/ACT nasal spray Place 1 spray into both nostrils daily as needed for allergies or rhinitis.   letrozole (FEMARA) 2.5 MG tablet TAKE (1) TABLET BY MOUTH ONCE DAILY. (Patient taking differently: Take 2.5 mg by mouth daily.)   lisinopril (ZESTRIL) 20 MG tablet TAKE ONE TABLET BY MOUTH ONCE DAILY.   metoprolol succinate (TOPROL-XL) 100 MG 24 hr tablet Take 1 tablet (100 mg total) by mouth daily.   Multiple Vitamin (MULTIVITAMIN WITH MINERALS) TABS Take 1 tablet by mouth at bedtime.    nitroGLYCERIN (NITROSTAT) 0.4 MG SL tablet Place 0.4 mg under the tongue every 5 (five) minutes as needed for chest pain.   omeprazole (PRILOSEC) 20 MG capsule Take 1 capsule (20 mg total) by mouth daily.   ondansetron (ZOFRAN ODT) 4 MG disintegrating tablet Take 1 tablet (4 mg total) by mouth every 8 (eight) hours as needed for nausea or vomiting.   rosuvastatin (CRESTOR) 5 MG  tablet TAKE (1) TABLET BY MOUTH ONCE DAILY.   venlafaxine XR (EFFEXOR XR) 75 MG 24 hr capsule TAKE ONE CAPSULE BY MOUTH TWO TIMES DAILY   venlafaxine XR (EFFEXOR-XR) 75 MG 24 hr capsule TAKE (1) CAPSULE BY MOUTH TWICE DAILY.   No facility-administered encounter medications on file as of 01/06/2021.    Allergies (verified) Penicillins   History: Past Medical History:  Diagnosis Date   Abnormal genetic test 04/09/2013   PALB2 and CHEK2 positivity    Arthritis    "of the spine" (05/08/2012)   Breast cancer  ([place]) 1995   "left" (05/08/2012)   Breast cancer, right breast, IDC, Multifocal 03/12/2012   Multifocal, 10 and 8 left breast    Diabetes mellitus 1999   "dx; never on meds" (05/08/2012)   Diabetes mellitus without complication ([place]) 01/07/1094   Essential hypertension, benign 01/08/2019   Exertional shortness of breath    Family history of thyroid cancer    GERD (gastroesophageal reflux disease)    tums   H/O hiatal hernia    Hemorrhoid    History of blood transfusion    post child birth   IBS (irritable bowel syndrome)    Osteopenia 12/11/2012   On Ca++ and Vit D. Bone density in April 2014.  Next bone density due in April 2016.   PONV (postoperative nausea and vomiting)    PPD positive, treated 1999   Past Surgical History:  Procedure Laterality Date   ABDOMINAL HYSTERECTOMY Bilateral 01/30/2013   Procedure: HYSTERECTOMY ABDOMINAL;  Surgeon: Melina Schools, MD;  Location: [facility];  Service: Gynecology;  Laterality: Bilateral;   BREAST BIOPSY Left 1995   BREAST BIOPSY Bilateral 03/2012   "one on the left; 2 on the right" (05/08/2012)   [place]   COLONOSCOPY  12/06/93   Dr. Hayes:single diminutive polyp of the descending colon/extrernal hemorrhoids, benign path   COLONOSCOPY  11/30/98   Dr Hayes:small rectal polyp/internal hemorrhoids, benign path   COLONOSCOPY  11/26/2006   Dr. Hayes:sigmoid polyp/small interal hemorrhoids, adenomatous   COLONOSCOPY N/A 03/30/2016   Dr. Oneida Alar: normal TI, 4 mm rectal polyp, multiple small and large-mouthed diverticula in rectosigmoid and sigmoid colon, non-bleeding internal hemorrhoids, tubular adenoma on path. surveillance in 5 yeras   COLONOSCOPY WITH ESOPHAGOGASTRODUODENOSCOPY (EGD) N/A 07/01/2012   Dr. Oneida Alar: 2 small adenomas, sigmoid colon diverticula, surveillance in 2019   [place]  07/01/2012   Dr. Oneida Alar: proximal esophageal web, Schatzki's ring at [employer]  junction, s/p dilaiton. Mild non-erosive gastritis. Repeat EGD July 23, 2012.    ESOPHAGOGASTRODUODENOSCOPY  1994   Dr. Cristina Gong: small hiatal hernia and Schatzki's ring widely patent, CLO test negative   ESOPHAGOGASTRODUODENOSCOPY (EGD) WITH ESOPHAGEAL DILATION N/A 07/23/2012   Dr. Fields:proximal esophageal web v. radiation induced stricture/schatzki ring/small HH, s/p dilation   LEFT HEART CATH AND CORONARY ANGIOGRAPHY N/A 03/16/2020   Procedure: LEFT HEART CATH AND CORONARY ANGIOGRAPHY;  Surgeon: Adrian Prows, MD;  Location: [facility];  Service: Cardiovascular;  Laterality: N/A;   MASTECTOMY COMPLETE / SIMPLE Left 05/08/2012   MASTECTOMY COMPLETE / SIMPLE W/ SENTINEL NODE BIOPSY Right 05/08/2012   SALPINGOOPHORECTOMY Bilateral 01/30/2013   Procedure: SALPINGO OOPHORECTOMY;  Surgeon: Melina Schools, MD;  Location: [facility];  Service: Gynecology;  Laterality: Bilateral;   SIMPLE MASTECTOMY WITH AXILLARY SENTINEL NODE BIOPSY Right 05/08/2012   Procedure: RIGHT TOTAL MASTECTOMY WITH AXILLARY SENTINEL NODE BIOPSY;  Surgeon:  Haywood Lasso, MD;  Location: [facility];  Service: General;  Laterality: Right;   SIMPLE MASTECTOMY WITH AXILLARY SENTINEL NODE BIOPSY Left 05/08/2012   Procedure: LEFT TOTAL MASTECTOMY;  Surgeon: Haywood Lasso, MD;  Location: [facility];  Service: General;  Laterality: Left;   TONSILLECTOMY AND ADENOIDECTOMY  ~ 2008   TUBAL LIGATION  1976   UVULOPALATOPHARYNGOPLASTY, TONSILLECTOMY AND SEPTOPLASTY  ~ 2008   Family History  Problem Relation Age of Onset   Lung cancer Mother 40       smoker   Leukemia Father 74       Hairy Cell at 53, CLL at 91   Lymphoma Father 34       Hodgkins lymphoma   Lung cancer Sister 73   Thyroid cancer Brother 66       Medullary   Lymphoma Brother 78       Follicular lymphoma   Cancer Daughter        thyroid   Other Daughter        leiomyoma of esophagus   Thyroid cancer Daughter    Cancer Daughter        breast   Breast cancer Daughter     Breast cancer Maternal Aunt        mother's paternal half sister   Colon cancer Maternal Aunt        mother's maternal half sister   Lung cancer Maternal Uncle        mother's paternal half brother   Cancer Paternal Uncle        oral cancer dx in his 66s   Lung cancer Maternal Grandmother        smoker   Lung cancer Cousin        paternal cousin   Breast cancer Cousin        paternal cousin died in her 70s   Social History   Socioeconomic History   Marital status: Married    Spouse name: Mortimer Fries   Number of children: 2   Years of education: 12th grade   Highest education level: Not on file  Occupational History   Occupation: retired    Fish farm manager: [employer]  Tobacco Use   Smoking status: Never   Smokeless tobacco: Never  Vaping Use   Vaping Use: Never used  Substance and Sexual Activity   Alcohol use: Not Currently    Comment: rare    Drug use: No   Sexual activity: Not Currently  Other Topics Concern   Not on file  Social History Narrative   Married 56 yrs.Lives with her husband.Retired.   Social Determinants of Health   Financial Resource Strain: Low Risk    Difficulty of Paying Living Expenses: Not hard at all  Food Insecurity: No Food Insecurity   Worried About Charity fundraiser in the Last Year: Never true   [place] in the Last Year: Never true  Transportation Needs: No Transportation Needs   Lack of Transportation (Medical): No   Lack of Transportation (Non-Medical): No  Physical Activity: Inactive   Days of Exercise per Week: 0 days   Minutes of Exercise per Session: 0 min  Stress: No Stress Concern Present   Feeling of Stress : Only a little  Social Connections: Engineer, building services of Communication with Friends and Family: More than three times a week   Frequency of Social Gatherings with Friends and Family: Three times a week   Attends Religious Services:  More than 4 times per year   Active Member of Clubs or  Organizations: Yes   Attends Archivist Meetings: More than 4 times per year   Marital Status: Married    Tobacco Counseling Counseling given: Not Answered   Clinical Intake:  Pre-visit preparation completed: No  Pain : No/denies pain Pain Score: 0-No pain     Nutritional Risks: None Diabetes: Yes CBG done?: No Did pt. bring in CBG monitor from home?: No  How often do you need to have someone help you when you read instructions, pamphlets, or other written materials from your doctor or pharmacy?: 1 - Never What is the last grade level you completed in school?: 12  Diabetic?Nutrition Risk Assessment:  Has the patient had any N/V/D within the last 2 months?  No  Does the patient have any non-healing wounds?  No  Has the patient had any unintentional weight loss or weight gain?  No   Diabetes:  Is the patient diabetic?  Yes  If diabetic, was a CBG obtained today?  No  Did the patient bring in their glucometer from home?   N/a How often do you monitor your CBG's? Does not check BS _0 .   Financial Strains and Diabetes Management:  Are you having any financial strains with the device, your supplies or your medication? No .  Does the patient want to be seen by Chronic Care Management for management of their diabetes?  No  Would the patient like to be referred to a Nutritionist or for Diabetic Management?  No   Diabetic Exams:  Diabetic Eye Exam: Overdue for diabetic eye exam. Pt has been advised about the importance in completing this exam. Patient advised to call and schedule an eye exam. Diabetic Foot Exam: Overdue, Pt has been advised about the importance in completing this exam. Pt is scheduled for diabetic foot exam on 01/06/2021.   Interpreter Needed?: No  Information entered by :: Judeen Hammans   Activities of Daily Living In your present state of health, do you have any difficulty performing the following activities: 01/06/2021 12/16/2020  Hearing? N N   Vision? N N  Difficulty concentrating or making decisions? N Y  Walking or climbing stairs? N N  Dressing or bathing? N N  Doing errands, shopping? N N  Preparing Food and eating ? N -  Using the Toilet? N -  In the past six months, have you accidently leaked urine? Y -  Do you have problems with loss of bowel control? N -  Managing your Medications? N -  Managing your Finances? N -  Housekeeping or managing your Housekeeping? N -  Some recent data might be hidden    Patient Care Team: Renee Rival, FNP as PCP - General (Nurse Practitioner) Everardo All, MD as Consulting Physician (Hematology and Oncology) Danie Binder, MD (Inactive) as Attending Physician (Gastroenterology) Eloise Harman, DO as Consulting Physician (Gastroenterology)  Indicate any recent Medical Services you may have received from other than [facility] providers in the past year (date may be approximate).     Assessment:   This is a routine wellness examination for Amy Houston.  Hearing/Vision screen No results found.  Dietary issues and exercise activities discussed: Current Exercise Habits: The patient does not participate in regular exercise at present, Exercise limited by: None identified   Goals Addressed   None   Depression Screen PHQ 2/9 Scores 01/06/2021 12/16/2020 04/29/2020 01/27/2020 07/24/2019 04/23/2019 01/08/2019  PHQ - 2 Score 0 0  0 1 0 0 1  PHQ- 9 Score - - 0 3 - - -  Exception Documentation - - - - - Medical reason -    Fall Risk Fall Risk  01/06/2021 12/16/2020 04/29/2020 07/24/2019 01/08/2019  Falls in the past year? 0 0 0 0 0  Comment - - - - -  Number falls in past yr: 0 0 - - 0  Injury with Fall? 0 0 - - 0  Risk for fall due to : No Fall Risks No Fall Risks - - -  Follow up Falls evaluation completed Falls evaluation completed - - -    FALL RISK PREVENTION PERTAINING TO THE HOME:  Any stairs in or around the home? Yes  If so, are there any without handrails? Yes  Home free of  loose throw rugs in walkways, pet beds, electrical cords, etc? Yes  Adequate lighting in your home to reduce risk of falls? Yes   ASSISTIVE DEVICES UTILIZED TO PREVENT FALLS:  Life alert? No  Use of a cane, walker or w/c? No  Grab bars in the bathroom? No  Shower chair or bench in shower? No  Elevated toilet seat or a handicapped toilet? No     Cognitive Function:     6CIT Screen 01/06/2021  What Year? 0 points  What month? 0 points  What time? 0 points  Count back from 20 0 points  Months in reverse 0 points  Repeat phrase 6 points  Total Score 6    Immunizations Immunization History  Administered Date(s) Administered   Fluad Quad(high Dose 65+) 10/01/2019, 12/16/2020   Influenza Split 10/16/2012   Influenza, High Dose Seasonal PF 10/12/2018   Influenza-Unspecified 10/16/2013, 10/02/2014   PFIZER(Purple Top)SARS-COV-2 Vaccination 01/23/2019, 02/14/2019, 11/13/2019   Pneumococcal Conjugate-13 01/08/2019   Pneumococcal Polysaccharide-23 04/29/2020   Tdap 11/21/2013    TDAP status: Up to date  Flu Vaccine status: Up to date  Pneumococcal vaccine status: Up to date  Covid-19 vaccine status: Information provided on how to obtain vaccines.   Qualifies for Shingles Vaccine? Yes   Zostavax completed No   Shingrix Completed?: No.    Education has been provided regarding the importance of this vaccine. Patient has been advised to call insurance company to determine out of pocket expense if they have not yet received this vaccine. Advised may also receive vaccine at local pharmacy or Health Dept. Verbalized acceptance and understanding.  Screening Tests Health Maintenance  Topic Date Due   Zoster Vaccines- Shingrix (1 of 2) Never done   COVID-19 Vaccine (4 - Booster for Pfizer series) 01/08/2020   FOOT EXAM  07/23/2020   OPHTHALMOLOGY EXAM  12/04/2020   HEMOGLOBIN A1C  06/16/2021   TETANUS/TDAP  11/22/2023   COLONOSCOPY (Pts 45-37yr Insurance coverage will need to be  confirmed)  03/31/2026   Pneumonia Vaccine 75 Years old  Completed   DEXA SCAN  Completed   Hepatitis C Screening  Completed   HPV VACCINES  Aged Out   MAMMOGRAM  Discontinued    Health Maintenance  Health Maintenance Due  Topic Date Due   Zoster Vaccines- Shingrix (1 of 2) Never done   COVID-19 Vaccine (4 - Booster for Pfizer series) 01/08/2020   FOOT EXAM  07/23/2020   OPHTHALMOLOGY EXAM  12/04/2020    Colorectal cancer screening: Type of screening: Colonoscopy. Completed 03/30/2016. Repeat every 10 years  Mammogram status: No longer required due to age.  Bone Density status: Completed 08/09/2020. Results reflect: Bone density results: OSTEOPENIA.  Repeat every 2 years.  Lung Cancer Screening: (Low Dose CT Chest recommended if Age 13-80 years, 30 pack-year currently smoking OR have quit w/in 15years.) does not qualify.   Lung Cancer Screening Referral: n/a  Additional Screening:  Hepatitis C Screening: does qualify; Completed 01/27/2020  Vision Screening: Recommended annual ophthalmology exams for early detection of glaucoma and other disorders of the eye. Is the patient up to date with their annual eye exam?  No  Who is the provider or what is the name of the office in which the patient attends annual eye exams? [facility] If pt is not established with a provider, would they like to be referred to a provider to establish care? No .   Dental Screening: Recommended annual dental exams for proper oral hygiene  Community Resource Referral / Chronic Care Management: CRR required this visit?  No   CCM required this visit?  No      Plan:     I have personally reviewed and noted the following in the patients chart:   Medical and social history Use of alcohol, tobacco or illicit drugs  Current medications and supplements including opioid prescriptions. Patient is not currently taking opioid prescriptions. Functional ability and status Nutritional status Physical  activity Advanced directives List of other physicians Hospitalizations, surgeries, and ER visits in previous 12 months Vitals Screenings to include cognitive, depression, and falls Referrals and appointments  In addition, I have reviewed and discussed with patient certain preventive protocols, quality metrics, and best practice recommendations. A written personalized care plan for preventive services as well as general preventive health recommendations were provided to patient.     Earline May[place], [place]   01/06/2021   Nurse Notes:  [name] , Thank you for taking time to come for your Medicare Wellness Visit. I appreciate your ongoing commitment to your health goals. Please review the following plan we discussed and let me know if I can assist you in the future.   These are the goals we discussed:  Goals   None     This is a list of the screening recommended for you and due dates:  Health Maintenance  Topic Date Due   Zoster (Shingles) Vaccine (1 of 2) Never done   COVID-19 Vaccine (4 - Booster for Pfizer series) 01/08/2020   Complete foot exam   07/23/2020   Eye exam for diabetics  12/04/2020   Hemoglobin A1C  06/16/2021   Tetanus Vaccine  11/22/2023   Colon Cancer Screening  03/31/2026   Pneumonia Vaccine  Completed   DEXA scan (bone density measurement)  Completed   Hepatitis C Screening: USPSTF Recommendation to screen - Ages 69-79 yo.  Completed   HPV Vaccine  Aged Out   Mammogram  Discontinued

## 2021-01-06 NOTE — Patient Instructions (Addendum)
[name] , Thank you for taking time to come for your Medicare Wellness Visit. I appreciate your ongoing commitment to your health goals. Please review the following plan we discussed and let me know if I can assist you in the future.   These are the goals we discussed:  Goals   None     This is a list of the screening recommended for you and due dates:  Health Maintenance  Topic Date Due   Zoster (Shingles) Vaccine (1 of 2) Never done   COVID-19 Vaccine (4 - Booster for Pfizer series) 01/08/2020   Complete foot exam   07/23/2020   Eye exam for diabetics  12/04/2020   Hemoglobin A1C  06/16/2021   Tetanus Vaccine  11/22/2023   Colon Cancer Screening  03/31/2026   Pneumonia Vaccine  Completed   DEXA scan (bone density measurement)  Completed   Hepatitis C Screening: USPSTF Recommendation to screen - Ages 24-79 yo.  Completed   HPV Vaccine  Aged Out   Mammogram  Discontinued    Health Maintenance, Female Adopting a healthy lifestyle and getting preventive care are important in promoting health and wellness. Ask your health care provider about: The right schedule for you to have regular tests and exams. Things you can do on your own to prevent diseases and keep yourself healthy. What should I know about diet, weight, and exercise? Eat a healthy diet  Eat a diet that includes plenty of vegetables, fruits, low-fat dairy products, and lean protein. Do not eat a lot of foods that are high in solid fats, added sugars, or sodium. Maintain a healthy weight Body mass index (BMI) is used to identify weight problems. It estimates body fat based on height and weight. Your health care provider can help determine your BMI and help you achieve or maintain a healthy weight. Get regular exercise Get regular exercise. This is one of the most important things you can do for your health. Most adults should: Exercise for at least 150 minutes each week. The exercise should increase your heart rate  and make you sweat (moderate-intensity exercise). Do strengthening exercises at least twice a week. This is in addition to the moderate-intensity exercise. Spend less time sitting. Even light physical activity can be beneficial. Watch cholesterol and blood lipids Have your blood tested for lipids and cholesterol at 74 years of age, then have this test every 5 years. Have your cholesterol levels checked more often if: Your lipid or cholesterol levels are high. You are older than 75 years of age. You are at high risk for heart disease. What should I know about cancer screening? Depending on your health history and family history, you may need to have cancer screening at various ages. This may include screening for: Breast cancer. Cervical cancer. Colorectal cancer. Skin cancer. Lung cancer. What should I know about heart disease, diabetes, and high blood pressure? Blood pressure and heart disease High blood pressure causes heart disease and increases the risk of stroke. This is more likely to develop in people who have high blood pressure readings or are overweight. Have your blood pressure checked: Every 3-5 years if you are 44-42 years of age. Every year if you are 37 years old or older. Diabetes Have regular diabetes screenings. This checks your fasting blood sugar level. Have the screening done: Once every three years after age 91 if you are at a normal weight and have a low risk for diabetes. More often and at a younger age  if you are overweight or have a high risk for diabetes. What should I know about preventing infection? Hepatitis B If you have a higher risk for hepatitis B, you should be screened for this virus. Talk with your health care provider to find out if you are at risk for hepatitis B infection. Hepatitis C Testing is recommended for: Everyone born from [birth year] through 1964. Anyone with known risk factors for hepatitis C. Sexually transmitted infections (STIs) Get  screened for STIs, including gonorrhea and chlamydia, if: You are sexually active and are younger than 75 years of age. You are older than 75 years of age and your health care provider tells you that you are at risk for this type of infection. Your sexual activity has changed since you were last screened, and you are at increased risk for chlamydia or gonorrhea. Ask your health care provider if you are at risk. Ask your health care provider about whether you are at high risk for HIV. Your health care provider may recommend a prescription medicine to help prevent HIV infection. If you choose to take medicine to prevent HIV, you should first get tested for HIV. You should then be tested every 3 months for as long as you are taking the medicine. Pregnancy If you are about to stop having your period (premenopausal) and you may become pregnant, seek counseling before you get pregnant. Take 400 to 800 micrograms (mcg) of folic acid every day if you become pregnant. Ask for birth control (contraception) if you want to prevent pregnancy. Osteoporosis and menopause Osteoporosis is a disease in which the bones lose minerals and strength with aging. This can result in bone fractures. If you are 42 years old or older, or if you are at risk for osteoporosis and fractures, ask your health care provider if you should: Be screened for bone loss. Take a calcium or vitamin D supplement to lower your risk of fractures. Be given hormone replacement therapy (HRT) to treat symptoms of menopause. Follow these instructions at home: Alcohol use Do not drink alcohol if: Your health care provider tells you not to drink. You are pregnant, may be pregnant, or are planning to become pregnant. If you drink alcohol: Limit how much you have to: 0-1 drink a day. Know how much alcohol is in your drink. In the [place] one drink equals one 12 oz bottle of beer (355 mL), one 5 oz glass of wine (148 mL), or one 1 oz glass of hard  liquor (44 mL). Lifestyle Do not use any products that contain nicotine or tobacco. These products include cigarettes, chewing tobacco, and vaping devices, such as e-cigarettes. If you need help quitting, ask your health care provider. Do not use street drugs. Do not share needles. Ask your health care provider for help if you need support or information about quitting drugs. General instructions Schedule regular health, dental, and eye exams. Stay current with your vaccines. Tell your health care provider if: You often feel depressed. You have ever been abused or do not feel safe at home. Summary Adopting a healthy lifestyle and getting preventive care are important in promoting health and wellness. Follow your health care provider's instructions about healthy diet, exercising, and getting tested or screened for diseases. Follow your health care provider's instructions on monitoring your cholesterol and blood pressure. This information is not intended to replace advice given to you by your health care provider. Make sure you discuss any questions you have with your health care provider. Document  Revised: 05/10/2020 Document Reviewed: 05/10/2020 Elsevier Patient Education  [employer].

## 2021-01-11 ENCOUNTER — Other Ambulatory Visit: Payer: Self-pay | Admitting: Nurse Practitioner

## 2021-01-25 ENCOUNTER — Other Ambulatory Visit (HOSPITAL_COMMUNITY): Payer: Self-pay | Admitting: Hematology

## 2021-02-08 NOTE — Progress Notes (Signed)
Reviewed at 08/18/20 visit by Dr. Delton Coombes

## 2021-02-12 ENCOUNTER — Ambulatory Visit
Admission: EM | Admit: 2021-02-12 | Discharge: 2021-02-12 | Disposition: A | Discharge status: Home / Self Care | Payer: PPO | Attending: Family Medicine | Admitting: Family Medicine

## 2021-02-12 ENCOUNTER — Other Ambulatory Visit: Payer: Self-pay

## 2021-02-12 DIAGNOSIS — M10071 Idiopathic gout, right ankle and foot: Secondary | ICD-10-CM | POA: Diagnosis not present

## 2021-02-12 MED ORDER — COLCHICINE 0.6 MG PO TABS: ORAL_TABLET | ORAL | 0 refills | Status: DC

## 2021-02-12 MED ORDER — INDOMETHACIN 50 MG PO CAPS: 50.0000 mg | ORAL_CAPSULE | Freq: Two times a day (BID) | ORAL | 0 refills | Status: DC | PRN

## 2021-02-12 NOTE — ED Triage Notes (Signed)
Patient states she is having a gout flare up in her right foot since yesterday.   Patient states she has not been prescribed any medicine or the gout yet  Patient did try some ice last night

## 2021-02-12 NOTE — ED Provider Notes (Signed)
[facility]    CSN: [id number] Arrival date & time: 02/12/21  0834      History   Chief Complaint Chief Complaint  Patient presents with   Gout   HPI Amy Houston is a 76 y.o. female.   Presenting today with 1 day history of right great toe pain, redness, swelling.  Denies injury to the area, fever, chills, numbness, tingling, loss of range of motion.  Tried some ice and Tylenol last night with minimal relief.  Has had gout in the past and states this feels very similar.  Past Medical History:  Diagnosis Date   Abnormal genetic test 04/09/2013   PALB2 and CHEK2 positivity    Arthritis    "of the spine" (05/08/2012)   Breast cancer ([place]) 1995   "left" (05/08/2012)   Breast cancer, right breast, IDC, Multifocal 03/12/2012   Multifocal, 10 and 8 left breast    Diabetes mellitus 1999   "dx; never on meds" (05/08/2012)   Diabetes mellitus without complication ([place]) 03/04/8248   Essential hypertension, benign 01/08/2019   Exertional shortness of breath    Family history of thyroid cancer    GERD (gastroesophageal reflux disease)    tums   H/O hiatal hernia    Hemorrhoid    History of blood transfusion    post child birth   IBS (irritable bowel syndrome)    Osteopenia 12/11/2012   On Ca++ and Vit D. Bone density in April 2014.  Next bone density due in April 2016.   PONV (postoperative nausea and vomiting)    PPD positive, treated 1999   Patient Active Problem List   Diagnosis Date Noted   Need for immunization against influenza 12/16/2020   Blood in the stool 12/16/2020   Long-term current use of bisphosphonate 08/12/2020   Dyspnea on exertion    Angina pectoris ([place]) 03/15/2020   Class 1 obesity with serious comorbidity and body mass index (BMI) of 33.0 to 33.9 in adult 09/04/2019   Family history of thyroid cancer 09/04/2019   Hyperlipidemia 09/04/2019   Vitamin D deficiency disease 07/24/2019   Essential hypertension, benign 01/08/2019   Diabetes mellitus  without complication ([place]) 53/97/6734   Diverticulitis of colon 03/08/2015   Acquired trigger finger 05/06/2013   Abnormal genetic test 04/09/2013   Osteopenia 12/11/2012   Thyroid nodule 06/21/2012   Gastroesophageal reflux disease without esophagitis 06/03/2012   Adenomatous polyps 06/03/2012   Breast cancer, right breast, IDC, Multifocal 03/12/2012   Popliteal cyst 08/18/2010   Hx Left breast cancer, UOQ, IDC, receptor negative 11/08/1993    Past Surgical History:  Procedure Laterality Date   ABDOMINAL HYSTERECTOMY Bilateral 01/30/2013   Procedure: HYSTERECTOMY ABDOMINAL;  Surgeon: Melina Schools, MD;  Location: [facility];  Service: Gynecology;  Laterality: Bilateral;   BREAST BIOPSY Left 1995   BREAST BIOPSY Bilateral 03/2012   "one on the left; 2 on the right" (05/08/2012)   [place]   COLONOSCOPY  12/06/93   Dr. Hayes:single diminutive polyp of the descending colon/extrernal hemorrhoids, benign path   COLONOSCOPY  11/30/98   Dr Hayes:small rectal polyp/internal hemorrhoids, benign path   COLONOSCOPY  11/26/2006   Dr. Hayes:sigmoid polyp/small interal hemorrhoids, adenomatous   COLONOSCOPY N/A 03/30/2016   Dr. Oneida Alar: normal TI, 4 mm rectal polyp, multiple small and large-mouthed diverticula in rectosigmoid and sigmoid colon, non-bleeding internal hemorrhoids, tubular adenoma on path. surveillance in 5 yeras   COLONOSCOPY WITH ESOPHAGOGASTRODUODENOSCOPY (EGD) N/A 07/01/2012  Dr. Oneida Alar: 2 small adenomas, sigmoid colon diverticula, surveillance in 2019   [place]  07/01/2012   Dr. Oneida Alar: proximal esophageal web, Schatzki's ring at GE junction, s/p dilaiton. Mild non-erosive gastritis. Repeat EGD July 23, 2012.    ESOPHAGOGASTRODUODENOSCOPY  1994   Dr. Cristina Gong: small hiatal hernia and Schatzki's ring widely patent, CLO test negative   ESOPHAGOGASTRODUODENOSCOPY (EGD) WITH ESOPHAGEAL DILATION  N/A 07/23/2012   Dr. Fields:proximal esophageal web v. radiation induced stricture/schatzki ring/small HH, s/p dilation   LEFT HEART CATH AND CORONARY ANGIOGRAPHY N/A 03/16/2020   Procedure: LEFT HEART CATH AND CORONARY ANGIOGRAPHY;  Surgeon: Adrian Prows, MD;  Location: [facility];  Service: Cardiovascular;  Laterality: N/A;   MASTECTOMY COMPLETE / SIMPLE Left 05/08/2012   MASTECTOMY COMPLETE / SIMPLE W/ SENTINEL NODE BIOPSY Right 05/08/2012   SALPINGOOPHORECTOMY Bilateral 01/30/2013   Procedure: SALPINGO OOPHORECTOMY;  Surgeon: Melina Schools, MD;  Location: [facility];  Service: Gynecology;  Laterality: Bilateral;   SIMPLE MASTECTOMY WITH AXILLARY SENTINEL NODE BIOPSY Right 05/08/2012   Procedure: RIGHT TOTAL MASTECTOMY WITH AXILLARY SENTINEL NODE BIOPSY;  Surgeon: Haywood Lasso, MD;  Location: [facility];  Service: General;  Laterality: Right;   SIMPLE MASTECTOMY WITH AXILLARY SENTINEL NODE BIOPSY Left 05/08/2012   Procedure: LEFT TOTAL MASTECTOMY;  Surgeon: Haywood Lasso, MD;  Location: [facility];  Service: General;  Laterality: Left;   TONSILLECTOMY AND ADENOIDECTOMY  ~ 2008   TUBAL LIGATION  1976   UVULOPALATOPHARYNGOPLASTY, TONSILLECTOMY AND SEPTOPLASTY  ~ 2008    OB History   No obstetric history on file.    Home Medications    Prior to Admission medications   Medication Sig Start Date End Date Taking? Authorizing Provider  colchicine 0.6 MG tablet Take 2 tablets at onset of pain.  May repeat 1 more tab in 2 hours if pain is not improving.  Repeat this daily until symptoms resolve.  Max of 3 tabs per day 02/12/21  Yes Volney American, PA-C  indomethacin (INDOCIN) 50 MG capsule Take 1 capsule (50 mg total) by mouth 2 (two) times daily as needed. 02/12/21  Yes Volney American, PA-C  acetaminophen (TYLENOL) 500 MG tablet Take 1,000 mg by mouth daily as needed for moderate pain or headache.     [provider]  aspirin EC 81 MG tablet Take 81 mg by mouth daily. Swallow  whole.    [provider]  calcium-vitamin D (OSCAL WITH D) 500-200 MG-UNIT tablet Take 1 tablet by mouth daily.     [provider]  cetirizine (ZYRTEC ALLERGY) 10 MG tablet Take 1 tablet (10 mg total) by mouth daily. Patient taking differently: Take 10 mg by mouth daily as needed for allergies. 08/21/19   Avegno, Darrelyn Hillock, FNP  Cholecalciferol (VITAMIN D) 2000 units CAPS Take 6,000 Units by mouth at bedtime.     [provider]  cyanocobalamin 1000 MCG tablet Take 2,000 mcg by mouth at bedtime.    [provider]  empagliflozin (JARDIANCE) 10 MG TABS tablet Take 1 tablet (10 mg total) by mouth daily before breakfast. 12/17/20   Paseda, Dewaine Conger, FNP  fluticasone (FLONASE) 50 MCG/ACT nasal spray Place 1 spray into both nostrils daily as needed for allergies or rhinitis.    [provider]  letrozole (FEMARA) 2.5 MG tablet TAKE (1) TABLET BY MOUTH ONCE DAILY. 01/25/21   Derek Jack, MD  lisinopril (ZESTRIL) 20 MG tablet TAKE ONE  TABLET BY MOUTH ONCE DAILY. 01/11/21   Renee Rival, FNP  metoprolol succinate (TOPROL-XL) 100 MG 24 hr tablet Take 1 tablet (100 mg total) by mouth daily. 05/03/20   Cantwell, Celeste C, PA-C  Multiple Vitamin (MULTIVITAMIN WITH MINERALS) TABS Take 1 tablet by mouth at bedtime.     [provider]  nitroGLYCERIN (NITROSTAT) 0.4 MG SL tablet Place 0.4 mg under the tongue every 5 (five) minutes as needed for chest pain.    [provider]  omeprazole (PRILOSEC) 20 MG capsule Take 1 capsule (20 mg total) by mouth daily. 05/18/20   Annitta Needs, NP  ondansetron (ZOFRAN ODT) 4 MG disintegrating tablet Take 1 tablet (4 mg total) by mouth every 8 (eight) hours as needed for nausea or vomiting. 07/10/18   Evalee Jefferson, PA-C  rosuvastatin (CRESTOR) 5 MG tablet TAKE (1) TABLET BY MOUTH ONCE DAILY. 11/05/20   Adrian Prows, MD  venlafaxine XR (EFFEXOR XR) 75 MG 24 hr capsule TAKE ONE CAPSULE BY MOUTH TWO TIMES  DAILY 12/24/20   Fayrene Helper, MD  venlafaxine XR (EFFEXOR-XR) 75 MG 24 hr capsule TAKE (1) CAPSULE BY MOUTH TWICE DAILY. 07/16/20 12/16/20  Doree Albee, MD    Family History Family History  Problem Relation Age of Onset   Lung cancer Mother 62       smoker   Leukemia Father 56       Hairy Cell at 90, CLL at 16   Lymphoma Father 4       Hodgkins lymphoma   Lung cancer Sister 50   Thyroid cancer Brother 81       Medullary   Lymphoma Brother 38       Follicular lymphoma   Cancer Daughter        thyroid   Other Daughter        leiomyoma of esophagus   Thyroid cancer Daughter    Cancer Daughter        breast   Breast cancer Daughter    Breast cancer Maternal Aunt        mother's paternal half sister   Colon cancer Maternal Aunt        mother's maternal half sister   Lung cancer Maternal Uncle        mother's paternal half brother   Cancer Paternal Uncle        oral cancer dx in his 65s   Lung cancer Maternal Grandmother        smoker   Lung cancer Cousin        paternal cousin   Breast cancer Cousin        paternal cousin died in her 74s    Social History Social History   Tobacco Use   Smoking status: Never   Smokeless tobacco: Never  Vaping Use   Vaping Use: Never used  Substance Use Topics   Alcohol use: Not Currently    Comment: rare    Drug use: No     Allergies   Penicillins   Review of Systems Review of Systems Per HPI   Physical Exam Triage Vital Signs ED Triage Vitals [02/12/21 1058]  Enc Vitals Group     BP (!) 169/83     Pulse Rate 79     Resp 16     Temp 98.5 F (36.9 C)     Temp Source Oral     SpO2 95 %     Weight  Height      Head Circumference      Peak Flow      Pain Score      Pain Loc      Pain Edu?      Excl. in [place]?    No data found.  Updated Vital Signs BP (!) 169/83 (BP Location: Right Arm)    Pulse 79    Temp 98.5 F (36.9 C) (Oral)    Resp 16    SpO2 95%   Visual Acuity Right Eye  Distance:   Left Eye Distance:   Bilateral Distance:    Right Eye Near:   Left Eye Near:    Bilateral Near:     Physical Exam Vitals and nursing note reviewed.  Constitutional:      Appearance: Normal appearance. She is not ill-appearing.  HENT:     Head: Atraumatic.  Eyes:     Extraocular Movements: Extraocular movements intact.     Conjunctiva/sclera: Conjunctivae normal.  Cardiovascular:     Rate and Rhythm: Normal rate and regular rhythm.     Heart sounds: Normal heart sounds.  Pulmonary:     Effort: Pulmonary effort is normal.     Breath sounds: Normal breath sounds.  Musculoskeletal:        General: Swelling and tenderness present. No deformity or signs of injury. Normal range of motion.     Cervical back: Normal range of motion and neck supple.     Comments: Base of right great toe erythematous, edematous, warm to the touch, significantly tender to palpation.  Range of motion intact but very painful.  Skin:    General: Skin is warm and dry.     Findings: Erythema present.  Neurological:     Mental Status: She is alert and oriented to person, place, and time.     Motor: No weakness.     Gait: Gait normal.     Comments: Right foot neurovascularly intact  Psychiatric:        Mood and Affect: Mood normal.        Thought Content: Thought content normal.        Judgment: Judgment normal.     UC Treatments / Results  Labs (all labs ordered are listed, but only abnormal results are displayed) Labs Reviewed - No data to display  EKG   Radiology No results found.  Procedures Procedures (including critical care time)  Medications Ordered in UC Medications - No data to display  Initial Impression / Assessment and Plan / UC Course  I have reviewed the triage vital signs and the nursing notes.  Pertinent labs & imaging results that were available during my care of the patient were reviewed by me and considered in my medical decision making (see chart for  details).     Treat with indomethacin, colchicine, RICE protocol.  Return for acutely worsening symptoms.  Final Clinical Impressions(s) / UC Diagnoses   Final diagnoses:  Acute idiopathic gout involving toe of right foot   Discharge Instructions   None    ED Prescriptions     Medication Sig Dispense Auth. Provider   indomethacin (INDOCIN) 50 MG capsule Take 1 capsule (50 mg total) by mouth 2 (two) times daily as needed. 14 capsule Volney American, [place]   colchicine 0.6 MG tablet Take 2 tablets at onset of pain.  May repeat 1 more tab in 2 hours if pain is not improving.  Repeat this daily until symptoms resolve.  Max of  3 tabs per day 10 tablet Volney American, [place]      PDMP not reviewed this encounter.   Volney American, [place] 02/12/21 1206

## 2021-02-14 ENCOUNTER — Telehealth: Payer: Self-pay | Admitting: Nurse Practitioner

## 2021-02-14 ENCOUNTER — Other Ambulatory Visit: Payer: Self-pay | Admitting: Nurse Practitioner

## 2021-02-14 ENCOUNTER — Other Ambulatory Visit: Payer: Self-pay

## 2021-02-14 DIAGNOSIS — R110 Nausea: Secondary | ICD-10-CM

## 2021-02-14 DIAGNOSIS — M109 Gout, unspecified: Secondary | ICD-10-CM

## 2021-02-14 MED ORDER — PREDNISONE 10 MG PO TABS: ORAL_TABLET | ORAL | 0 refills | Status: DC

## 2021-02-14 MED ORDER — ONDANSETRON 4 MG PO TBDP: 4.0000 mg | ORAL_TABLET | Freq: Three times a day (TID) | ORAL | 0 refills | Status: DC | PRN

## 2021-02-14 NOTE — Telephone Encounter (Signed)
Pt called in regard to ER visit 2/10  Pt states she was prescribed 2 meds for gout   colchicine 0.6 MG tablet  * causing nausea / sick / diarrhea   indomethacin (INDOCIN) 50 MG capsule  Pt wants to know if there is anything else she could take, thinks she may have had prednisone in the past .  Pt would also like something for nausea   Pt would also like a call back

## 2021-02-14 NOTE — Telephone Encounter (Signed)
Patient aware.

## 2021-02-17 ENCOUNTER — Other Ambulatory Visit: Payer: Self-pay | Admitting: Student

## 2021-02-17 DIAGNOSIS — I10 Essential (primary) hypertension: Secondary | ICD-10-CM

## 2021-02-24 ENCOUNTER — Encounter: Payer: Self-pay | Admitting: Nurse Practitioner

## 2021-02-24 ENCOUNTER — Other Ambulatory Visit: Payer: Self-pay

## 2021-02-24 DIAGNOSIS — M109 Gout, unspecified: Secondary | ICD-10-CM

## 2021-02-24 MED ORDER — PREDNISONE 10 MG PO TABS: ORAL_TABLET | ORAL | 0 refills | Status: DC

## 2021-02-24 NOTE — Telephone Encounter (Signed)
Refill sent.

## 2021-03-01 ENCOUNTER — Ambulatory Visit: Payer: PPO

## 2021-03-01 ENCOUNTER — Other Ambulatory Visit: Payer: Self-pay

## 2021-03-01 LAB — HM DIABETES EYE EXAM

## 2021-03-29 ENCOUNTER — Encounter: Payer: Self-pay | Admitting: *Deleted

## 2021-03-31 ENCOUNTER — Other Ambulatory Visit (HOSPITAL_COMMUNITY): Payer: Self-pay | Admitting: Hematology

## 2021-04-07 ENCOUNTER — Other Ambulatory Visit: Payer: Self-pay | Admitting: Nurse Practitioner

## 2021-04-07 ENCOUNTER — Ambulatory Visit
Admission: EM | Admit: 2021-04-07 | Discharge: 2021-04-07 | Disposition: A | Discharge status: Home / Self Care | Payer: PPO | Attending: Urgent Care | Admitting: Urgent Care

## 2021-04-07 ENCOUNTER — Telehealth: Payer: Self-pay | Admitting: Nurse Practitioner

## 2021-04-07 DIAGNOSIS — M109 Gout, unspecified: Secondary | ICD-10-CM

## 2021-04-07 DIAGNOSIS — M79674 Pain in right toe(s): Secondary | ICD-10-CM | POA: Diagnosis not present

## 2021-04-07 DIAGNOSIS — E119 Type 2 diabetes mellitus without complications: Secondary | ICD-10-CM

## 2021-04-07 MED ORDER — PREDNISONE 10 MG PO TABS: 30.0000 mg | ORAL_TABLET | Freq: Every day | ORAL | 0 refills | Status: DC

## 2021-04-07 NOTE — Telephone Encounter (Signed)
Spoke with pt advised of Fola's message and going to UC pt understood ?

## 2021-04-07 NOTE — Telephone Encounter (Signed)
Please advise pt scheduled 4/20 ?

## 2021-04-07 NOTE — ED Triage Notes (Signed)
Pt reports pain and swelling in right foot, related to Gout, since last night.   ?

## 2021-04-07 NOTE — ED Provider Notes (Signed)
?[place] ? ? ?MRN: [id number] DOB: Oct 29, 1945 ? ?Subjective:  ? ?Amy Houston is a 76 y.o. female presenting for 1 day history of acute onset recurrent right foot pain over the great toe, has swelling. Has a history of recurrent gout over this area. No ckd. Eats processed meats like sausage, hot dogs.  Does not eat a lot of seafood.  Does not take medications to cause gout.  No falls, trauma. ? ?No current facility-administered medications for this encounter. ? ?Current Outpatient Medications:  ?  acetaminophen (TYLENOL) 500 MG tablet, Take 1,000 mg by mouth daily as needed for moderate pain or headache. , Disp: , Rfl:  ?  aspirin EC 81 MG tablet, Take 81 mg by mouth daily. Swallow whole., Disp: , Rfl:  ?  calcium-vitamin D (OSCAL WITH D) 500-200 MG-UNIT tablet, Take 1 tablet by mouth daily. , Disp: , Rfl:  ?  cetirizine (ZYRTEC ALLERGY) 10 MG tablet, Take 1 tablet (10 mg total) by mouth daily. (Patient taking differently: Take 10 mg by mouth daily as needed for allergies.), Disp: 30 tablet, Rfl: 0 ?  Cholecalciferol (VITAMIN D) 2000 units CAPS, Take 6,000 Units by mouth at bedtime. , Disp: , Rfl:  ?  cyanocobalamin 1000 MCG tablet, Take 2,000 mcg by mouth at bedtime., Disp: , Rfl:  ?  empagliflozin (JARDIANCE) 10 MG TABS tablet, Take 1 tablet (10 mg total) by mouth daily before breakfast., Disp: 30 tablet, Rfl: 3 ?  fluticasone (FLONASE) 50 MCG/ACT nasal spray, Place 1 spray into both nostrils daily as needed for allergies or rhinitis., Disp: , Rfl:  ?  letrozole (FEMARA) 2.5 MG tablet, TAKE (1) TABLET BY MOUTH ONCE DAILY., Disp: 30 tablet, Rfl: 6 ?  lisinopril (ZESTRIL) 20 MG tablet, TAKE ONE TABLET BY MOUTH ONCE DAILY., Disp: 90 tablet, Rfl: 0 ?  metoprolol succinate (TOPROL-XL) 100 MG 24 hr tablet, TAKE 1 TABLET BY MOUTH ONCE A DAY., Disp: 90 tablet, Rfl: 3 ?  Multiple Vitamin (MULTIVITAMIN WITH MINERALS) TABS, Take 1 tablet by mouth at bedtime. , Disp: , Rfl:  ?  nitroGLYCERIN  (NITROSTAT) 0.4 MG SL tablet, Place 0.4 mg under the tongue every 5 (five) minutes as needed for chest pain., Disp: , Rfl:  ?  omeprazole (PRILOSEC) 20 MG capsule, Take 1 capsule (20 mg total) by mouth daily., Disp: 90 capsule, Rfl: 3 ?  ondansetron (ZOFRAN ODT) 4 MG disintegrating tablet, Take 1 tablet (4 mg total) by mouth every 8 (eight) hours as needed for nausea or vomiting., Disp: 20 tablet, Rfl: 0 ?  predniSONE (DELTASONE) 10 MG tablet, Take 30 mg daily for 5 days, Disp: 15 tablet, Rfl: 0 ?  rosuvastatin (CRESTOR) 5 MG tablet, TAKE (1) TABLET BY MOUTH ONCE DAILY., Disp: 90 tablet, Rfl: 0 ?  venlafaxine XR (EFFEXOR XR) 75 MG 24 hr capsule, TAKE ONE CAPSULE BY MOUTH TWO TIMES DAILY, Disp: 60 capsule, Rfl: 3 ?  venlafaxine XR (EFFEXOR-XR) 75 MG 24 hr capsule, TAKE (1) CAPSULE BY MOUTH TWICE DAILY., Disp: 60 capsule, Rfl: 3  ? ?Allergies  ?Allergen Reactions  ? Penicillins Rash  ?  Has patient had a PCN reaction causing immediate rash, facial/tongue/throat swelling, SOB or lightheadedness with hypotension: Yes ?Has patient had a PCN reaction causing severe rash involving mucus membranes or skin necrosis: No ?Has patient had a PCN reaction that required hospitalization No ?Has patient had a PCN reaction occurring within the last 10 years: No ?If all of the above answers are "NO", then  may proceed with Cephalosporin use. ?  ? ? ?Past Medical History:  ?Diagnosis Date  ? Abnormal genetic test 04/09/2013  ? PALB2 and CHEK2 positivity   ? Arthritis   ? "of the spine" (05/08/2012)  ? Breast cancer [facility]) 1995  ? "left" (05/08/2012)  ? Breast cancer, right breast, IDC, Multifocal 03/12/2012  ? Multifocal, 10 and 8 left breast   ? Diabetes mellitus 1999  ? "dx; never on meds" (05/08/2012)  ? Diabetes mellitus without complication ([place]) 02/02/1153  ? Essential hypertension, benign 01/08/2019  ? Exertional shortness of breath   ? Family history of thyroid cancer   ? GERD (gastroesophageal reflux disease)   ? tums  ? H/O hiatal hernia    ? Hemorrhoid   ? History of blood transfusion   ? post child birth  ? IBS (irritable bowel syndrome)   ? Osteopenia 12/11/2012  ? On Ca++ and Vit D. Bone density in April 2014.  Next bone density due in April 2016.  ? PONV (postoperative nausea and vomiting)   ? PPD positive, treated 1999  ?  ? ?Past Surgical History:  ?Procedure Laterality Date  ? ABDOMINAL HYSTERECTOMY Bilateral 01/30/2013  ? Procedure: HYSTERECTOMY ABDOMINAL;  Surgeon: Melina Schools, MD;  Location: [facility];  Service: Gynecology;  Laterality: Bilateral;  ? BREAST BIOPSY Left 1995  ? BREAST BIOPSY Bilateral 03/2012  ? "one on the left; 2 on the right" (05/08/2012)  ? BREAST LUMPECTOMY Left 1995  ? CHOLECYSTECTOMY  1992  ? COLONOSCOPY  12/06/93  ? Dr. Hayes:single diminutive polyp of the descending colon/extrernal hemorrhoids, benign path  ? COLONOSCOPY  11/30/98  ? Dr Hayes:small rectal polyp/internal hemorrhoids, benign path  ? COLONOSCOPY  11/26/2006  ? Dr. Hayes:sigmoid polyp/small interal hemorrhoids, adenomatous  ? COLONOSCOPY N/A 03/30/2016  ? Dr. Oneida Alar: normal TI, 4 mm rectal polyp, multiple small and large-mouthed diverticula in rectosigmoid and sigmoid colon, non-bleeding internal hemorrhoids, tubular adenoma on path. surveillance in 5 yeras  ? COLONOSCOPY WITH ESOPHAGOGASTRODUODENOSCOPY (EGD) N/A 07/01/2012  ? Dr. Oneida Alar: 2 small adenomas, sigmoid colon diverticula, surveillance in 2019  ? Ball OF UTERUS  1968  ? ESOPHAGEAL DILATION  07/01/2012  ? Dr. Oneida Alar: proximal esophageal web, Schatzki's ring at GE junction, s/p dilaiton. Mild non-erosive gastritis. Repeat EGD July 23, 2012.   ? ESOPHAGOGASTRODUODENOSCOPY  1994  ? Dr. Cristina Gong: small hiatal hernia and Schatzki's ring widely patent, CLO test negative  ? ESOPHAGOGASTRODUODENOSCOPY (EGD) WITH ESOPHAGEAL DILATION N/A 07/23/2012  ? Dr. Fields:proximal esophageal web v. radiation induced stricture/schatzki ring/small HH, s/p dilation  ? LEFT HEART CATH AND CORONARY ANGIOGRAPHY  N/A 03/16/2020  ? Procedure: LEFT HEART CATH AND CORONARY ANGIOGRAPHY;  Surgeon: Adrian Prows, MD;  Location: [place] [facility];  Service: Cardiovascular;  Laterality: N/A;  ? MASTECTOMY COMPLETE / SIMPLE Left 05/08/2012  ? MASTECTOMY COMPLETE / SIMPLE W/ SENTINEL NODE BIOPSY Right 05/08/2012  ? SALPINGOOPHORECTOMY Bilateral 01/30/2013  ? Procedure: SALPINGO OOPHORECTOMY;  Surgeon: Melina Schools, MD;  Location: [facility];  Service: Gynecology;  Laterality: Bilateral;  ? SIMPLE MASTECTOMY WITH AXILLARY SENTINEL NODE BIOPSY Right 05/08/2012  ? Procedure: RIGHT TOTAL MASTECTOMY WITH AXILLARY SENTINEL NODE BIOPSY;  Surgeon: Haywood Lasso, MD;  Location: [place];  Service: General;  Laterality: Right;  ? SIMPLE MASTECTOMY WITH AXILLARY SENTINEL NODE BIOPSY Left 05/08/2012  ? Procedure: LEFT TOTAL MASTECTOMY;  Surgeon: Haywood Lasso, MD;  Location: [place];  Service: General;  Laterality: Left;  ? TONSILLECTOMY AND ADENOIDECTOMY  ~  2008  ? TUBAL LIGATION  1976  ? UVULOPALATOPHARYNGOPLASTY, TONSILLECTOMY AND SEPTOPLASTY  ~ 2008  ? ? ?Family History  ?Problem Relation Age of Onset  ? Lung cancer Mother 8  ?     smoker  ? Leukemia Father 55  ?     Hairy Cell at 67, CLL at 72  ? Lymphoma Father 56  ?     Hodgkins lymphoma  ? Lung cancer Sister 94  ? Thyroid cancer Brother 65  ?     Medullary  ? Lymphoma Brother 51  ?     Follicular lymphoma  ? Cancer Daughter   ?     thyroid  ? Other Daughter   ?     leiomyoma of esophagus  ? Thyroid cancer Daughter   ? Cancer Daughter   ?     breast  ? Breast cancer Daughter   ? Breast cancer Maternal Aunt   ?     mother's paternal half sister  ? Colon cancer Maternal Aunt   ?     mother's maternal half sister  ? Lung cancer Maternal Uncle   ?     mother's paternal half brother  ? Cancer Paternal Uncle   ?     oral cancer dx in his 45s  ? Lung cancer Maternal Grandmother   ?     smoker  ? Lung cancer Cousin   ?     paternal cousin  ? Breast cancer Cousin   ?     paternal cousin died in her 81s   ? ? ?Social History  ? ?Tobacco Use  ? Smoking status: Never  ? Smokeless tobacco: Never  ?Vaping Use  ? Vaping Use: Never used  ?Substance Use Topics  ? Alcohol use: Not Currently  ?  Comment: rare   ? Drug [place]

## 2021-04-07 NOTE — Telephone Encounter (Signed)
Patient is requesting  ? ?predniSONE (DELTASONE) 10 MG  ? ?To be sent in  ? ?Patient is having a flare up of the gout. ? ?Patient would like a call back when med is sent in.  ?

## 2021-04-18 ENCOUNTER — Ambulatory Visit: Payer: PPO | Admitting: Nurse Practitioner

## 2021-04-19 ENCOUNTER — Encounter: Payer: Self-pay | Admitting: Family Medicine

## 2021-04-19 ENCOUNTER — Ambulatory Visit (HOSPITAL_COMMUNITY)
Admission: RE | Admit: 2021-04-19 | Discharge: 2021-04-19 | Disposition: A | Discharge status: Home / Self Care | Payer: PPO | Source: Ambulatory Visit | Attending: Family Medicine | Admitting: Family Medicine

## 2021-04-19 ENCOUNTER — Ambulatory Visit (INDEPENDENT_AMBULATORY_CARE_PROVIDER_SITE_OTHER): Payer: PPO | Admitting: Family Medicine

## 2021-04-19 VITALS — BP 145/69 | HR 71 | Resp 16 | Ht <= 58 in | Wt 157.4 lb

## 2021-04-19 DIAGNOSIS — F325 Major depressive disorder, single episode, in full remission: Secondary | ICD-10-CM | POA: Diagnosis not present

## 2021-04-19 DIAGNOSIS — I10 Essential (primary) hypertension: Secondary | ICD-10-CM

## 2021-04-19 DIAGNOSIS — J320 Chronic maxillary sinusitis: Secondary | ICD-10-CM | POA: Diagnosis not present

## 2021-04-19 DIAGNOSIS — J209 Acute bronchitis, unspecified: Secondary | ICD-10-CM | POA: Insufficient documentation

## 2021-04-19 MED ORDER — AZITHROMYCIN 250 MG PO TABS: ORAL_TABLET | ORAL | 0 refills | Status: AC

## 2021-04-19 MED ORDER — PREDNISONE 5 MG PO TABS: 5.0000 mg | ORAL_TABLET | Freq: Two times a day (BID) | ORAL | 0 refills | Status: AC

## 2021-04-19 MED ORDER — ALBUTEROL SULFATE HFA 108 (90 BASE) MCG/ACT IN AERS: 2.0000 | INHALATION_SPRAY | Freq: Four times a day (QID) | RESPIRATORY_TRACT | 0 refills | Status: DC | PRN

## 2021-04-19 MED ORDER — BENZONATATE 100 MG PO CAPS: 100.0000 mg | ORAL_CAPSULE | Freq: Two times a day (BID) | ORAL | 0 refills | Status: DC | PRN

## 2021-04-19 NOTE — Patient Instructions (Signed)
Keep appt with Kristin Bruins this week please ? ?F/U with Dr Moshe Cipro in 5 to 6 weeks re evaluate stress and depression ? ?Pls get fasting labs ordered tomorrow morning, nothing but water after 8 pm tonight ? ?Prednisone, Azithromycin, tessalon perles and albuterol are prescribed, you are treated for acute bronchitis and  left maxillary sinusitis ? ?Thanks for choosing [facility], we consider it a privelige to serve you. ? ?

## 2021-04-21 ENCOUNTER — Ambulatory Visit (INDEPENDENT_AMBULATORY_CARE_PROVIDER_SITE_OTHER): Payer: PPO | Admitting: Nurse Practitioner

## 2021-04-21 ENCOUNTER — Encounter: Payer: Self-pay | Admitting: Nurse Practitioner

## 2021-04-21 VITALS — BP 142/68 | HR 86 | Ht <= 58 in | Wt 155.0 lb

## 2021-04-21 DIAGNOSIS — E118 Type 2 diabetes mellitus with unspecified complications: Secondary | ICD-10-CM | POA: Diagnosis not present

## 2021-04-21 DIAGNOSIS — E785 Hyperlipidemia, unspecified: Secondary | ICD-10-CM

## 2021-04-21 DIAGNOSIS — F322 Major depressive disorder, single episode, severe without psychotic features: Secondary | ICD-10-CM | POA: Insufficient documentation

## 2021-04-21 DIAGNOSIS — I10 Essential (primary) hypertension: Secondary | ICD-10-CM | POA: Diagnosis not present

## 2021-04-21 DIAGNOSIS — E1165 Type 2 diabetes mellitus with hyperglycemia: Secondary | ICD-10-CM

## 2021-04-21 DIAGNOSIS — F325 Major depressive disorder, single episode, in full remission: Secondary | ICD-10-CM

## 2021-04-21 DIAGNOSIS — M109 Gout, unspecified: Secondary | ICD-10-CM | POA: Insufficient documentation

## 2021-04-21 DIAGNOSIS — J209 Acute bronchitis, unspecified: Secondary | ICD-10-CM

## 2021-04-21 DIAGNOSIS — M1A4710 Other secondary chronic gout, right ankle and foot, without tophus (tophi): Secondary | ICD-10-CM

## 2021-04-21 HISTORY — DX: Major depressive disorder, single episode, severe without psychotic features: F32.2

## 2021-04-21 LAB — LIPID PANEL
Chol/HDL Ratio: 4.1 ratio (ref 0.0–4.4)
Cholesterol, Total: 170 mg/dL (ref 100–199)
HDL: 41 mg/dL (ref >39)
LDL Chol Calc (NIH): 103 mg/dL — ABNORMAL HIGH (ref 0–99)
Triglycerides: 144 mg/dL (ref 0–149)
VLDL Cholesterol Cal: 26 mg/dL (ref 5–40)

## 2021-04-21 LAB — HEMOGLOBIN A1C
Est. average glucose Bld gHb Est-mCnc: 169 mg/dL
Hgb A1c MFr Bld: 7.5 % — ABNORMAL HIGH (ref 4.8–5.6)

## 2021-04-21 LAB — CMP14+EGFR
ALT: 20 IU/L (ref 0–32)
AST: 24 IU/L (ref 0–40)
Albumin/Globulin Ratio: 1.6 (ref 1.2–2.2)
Albumin: 4.2 g/dL (ref 3.7–4.7)
Alkaline Phosphatase: 73 IU/L (ref 44–121)
BUN/Creatinine Ratio: 22 (ref 12–28)
BUN: 22 mg/dL (ref 8–27)
Bilirubin Total: 0.4 mg/dL (ref 0.0–1.2)
CO2: 19 mmol/L — ABNORMAL LOW (ref 20–29)
Calcium: 9.8 mg/dL (ref 8.7–10.3)
Chloride: 105 mmol/L (ref 96–106)
Creatinine, Ser: 0.99 mg/dL (ref 0.57–1.00)
Globulin, Total: 2.7 g/dL (ref 1.5–4.5)
Glucose: 184 mg/dL — ABNORMAL HIGH (ref 70–99)
Potassium: 5 mmol/L (ref 3.5–5.2)
Sodium: 139 mmol/L (ref 134–144)
Total Protein: 6.9 g/dL (ref 6.0–8.5)
eGFR: 60 mL/min/{1.73_m2} (ref >59)

## 2021-04-21 MED ORDER — LISINOPRIL 40 MG PO TABS: 40.0000 mg | ORAL_TABLET | Freq: Every day | ORAL | 3 refills | Status: DC

## 2021-04-21 MED ORDER — ROSUVASTATIN CALCIUM 10 MG PO TABS: 10.0000 mg | ORAL_TABLET | Freq: Every day | ORAL | 0 refills | Status: DC

## 2021-04-21 MED ORDER — EMPAGLIFLOZIN 25 MG PO TABS: 25.0000 mg | ORAL_TABLET | Freq: Every day | ORAL | 3 refills | Status: DC

## 2021-04-21 NOTE — Patient Instructions (Signed)
Please get your labs done in 2 weeks  ?Start taking lisinopril 40 mg daily for your hypertension.  ?Stat taking Crestor 10 mg daily for your cholesterol ?Star taking jardiance 25 mg daily for your diabetes.  ? ? ?It is important that you exercise regularly at least 30 minutes 5 times a week.  ?Think about what you will eat, plan ahead. ?Choose " clean, green, fresh or frozen" over canned, processed or packaged foods which are more sugary, salty and fatty. ?70 to 75% of food eaten should be vegetables and fruit. ?Three meals at set times with snacks allowed between meals, but they must be fruit or vegetables. ?Aim to eat over a 12 hour period , example 7 am to 7 pm, and STOP after  your last meal of the day. ?Drink water,generally about 64 ounces per day, no other drink is as healthy. Fruit juice is best enjoyed in a healthy way, by EATING the fruit. ? ?Thanks for choosing [facility], we consider it a privelige to serve you. ? ? ?

## 2021-04-21 NOTE — Assessment & Plan Note (Signed)
BP Readings from Last 3 Encounters:  ?04/21/21 (!) 142/68  ?04/19/21 (!) 145/69  ?04/07/21 134/73  ?BP not at goal of less than 130/80 ?On lisinopril 20 mg daily, metoprolol 100 mg daily ?Start lisinopril 40 mg daily continue metoprolol 100 mg daily ?BMP in 2 weeks ?Follow-up in 6 weeks ?Encouraged to monitor blood pressure at home ?DASH diet advised, engage in regular daily exercises at least 150 minutes daily. ?

## 2021-04-21 NOTE — Assessment & Plan Note (Signed)
Continue Tessalon 100 mg twice daily as needed azithromycin 25 mg tablet, prednisone 5 mg twice daily ?Awaiting chest x-ray report ?

## 2021-04-21 NOTE — Assessment & Plan Note (Signed)
PHQ-9 score 10 ?Has been having a lot of stress dealing with her aging husband at home ?Refused referral for therapy ?Denies Si HI ?Feels safe at home ?Currently on Effexor 75 mg twice daily ?

## 2021-04-21 NOTE — Assessment & Plan Note (Addendum)
Lab Results  ?Component Value Date  ? HGBA1C 7.5 (H) 04/20/2021  ?currently on jardiance 10 mg daily ?Start jardiance 25mg  daily.  ?Avoid sugar sweets soda ?Follow-up in 6 weeks ?Foot exam completed today  ?Up-to-date with eye exam ?On ACE, statin ?Check urine creatinine albumin ratio ? ? ?

## 2021-04-21 NOTE — Assessment & Plan Note (Signed)
Has been having frequent gout attacks ?Prednisone has helped in the past ?Need for allopurinol prophylaxis discussed with patient ?Patient refused to start medication. ?

## 2021-04-21 NOTE — Progress Notes (Signed)
? ?[name]     MRN: [id number]      DOB: [birth year] ? ? ?HPI ?Amy Houston with past medical history of essential hypertension, angina pectoris, GERD, controlled diabetes mellitus type 2 with complications, hyperlipidemia is here for follow up for her chronic medical conditions ? ?She was seen two days ago for bronchitis, currently taking her antibiotics, prednisone and cough medicine as needed has had a chest x-ray done but awaiting the results patient denies no fever, chills , wheezing shortness of breath.  Still having a lot of cough. ? ?Occasionally checks BP at home, does not remember what numbers she gets .  States that she has been taking her blood pressure medications as ordered denies edema, chest pain, dizziness ? ?She has been having frequent gout flare up, prednisone has been helping her gout, refused starting allopurinol gout prophylasix today.Taking OTC cherry tar extract .  ? ?She has been taking effeor fro 20 years , concerned about aging , she is retired, her husnad has ADHD he has been getting on her nerves. Feels save at home , deos not want referral to theray at this time.  Denies SI, HI ? ? ?ROS ?Denies recent fever or chills. ?Denies sinus pressure, nasal congestion, ear pain or sore throat. ?Denies chest pains, palpitations and leg swelling ?Denies abdominal pain, nausea, vomiting,diarrhea or constipation.   ?Denies dysuria, frequency, hesitancy or incontinence. ?Denies joint pain, swelling and limitation in mobility. ?Denies headaches, seizures, numbness, or tingling. ?Denies depression, anxiety or insomnia. ? ? ?PE ? ?BP (!) 142/68 (BP Location: Right Arm, Cuff Size: Large)   Pulse 86   Ht 4\' 10"  (1.473 m)   Wt 155 lb (70.3 kg)   SpO2 98%   BMI 32.40 kg/m?  ? ?Patient alert and oriented and in no cardiopulmonary distress. ? ?Chest:   Has coughing spells with deep breathing, no wheezing noted, ? ?CVS: S1, S2 no murmurs, no S3.Regular rate. ? ?ABD: Soft non tender.  ? ?Ext: No  edema ? ?MS: Adequate ROM spine, shoulders, hips and knees. ? ?Psych: Good eye contact, normal affect. Memory intact not anxious or depressed appearing. ? ? ? ? ?Assessment & Plan ? ?Hyperlipidemia ?Lab Results  ?Component Value Date  ? CHOL 170 04/20/2021  ? HDL 41 04/20/2021  ? LDLCALC 103 (H) 04/20/2021  ? LDLDIRECT 71 02/26/2020  ? TRIG 144 04/20/2021  ? CHOLHDL 4.1 04/20/2021  ?The 10-year ASCVD risk score (Arnett DK, et al., 2019) is: 39.1% ?  Values used to calculate the score: ?    Age: 76 years ?    Sex: Female ?    Is Non-Hispanic African American: No ?    Diabetic: Yes ?    Tobacco smoker: No ?    Systolic Blood Pressure: 703 mmHg ?    Is BP treated: Yes ?    HDL Cholesterol: 41 mg/dL ?    Total Cholesterol: 170 mg/dL ? ?LDL goal is less than 70 ?currently on Crestor 5 mg  ?Start crestor 10mg  daily  ?Avoid fried fatty foods.  ?Lipid panel in 6 weeks.  ? ?Uncontrolled type 2 diabetes mellitus with hyperglycemia, without long-term current use of insulin ([place]) ?Lab Results  ?Component Value Date  ? HGBA1C 7.5 (H) 04/20/2021  ?currently on jardiance 10 mg daily ?Start jardiance 25mg  daily.  ?Avoid sugar sweets soda ?Follow-up in 6 weeks ?Foot exam completed today  ?Up-to-date with eye exam ?On ACE, statin ?Check urine creatinine albumin ratio ? ? ? ?Essential  hypertension, benign ?BP Readings from Last 3 Encounters:  ?04/21/21 (!) 142/68  ?04/19/21 (!) 145/69  ?04/07/21 134/73  ?BP not at goal of less than 130/80 ?On lisinopril 20 mg daily, metoprolol 100 mg daily ?Start lisinopril 40 mg daily continue metoprolol 100 mg daily ?BMP in 2 weeks ?Follow-up in 6 weeks ?Encouraged to monitor blood pressure at home ?DASH diet advised, engage in regular daily exercises at least 150 minutes daily. ? ?Acute bronchitis ?Continue Tessalon 100 mg twice daily as needed azithromycin 25 mg tablet, prednisone 5 mg twice daily ?Awaiting chest x-ray report ? ?Gout ?Has been having frequent gout attacks ?Prednisone has helped in  the past ?Need for allopurinol prophylaxis discussed with patient ?Patient refused to start medication. ? ?Depression, major, single episode, complete remission ([place]) ?PHQ-9 score 10 ?Has been having a lot of stress dealing with her aging husband at home ?Refused referral for therapy ?Denies Si HI ?Feels safe at home ?Currently on Effexor 75 mg twice daily  ?

## 2021-04-21 NOTE — Assessment & Plan Note (Addendum)
Lab Results  ?Component Value Date  ? CHOL 170 04/20/2021  ? HDL 41 04/20/2021  ? LDLCALC 103 (H) 04/20/2021  ? LDLDIRECT 71 02/26/2020  ? TRIG 144 04/20/2021  ? CHOLHDL 4.1 04/20/2021  ?The 10-year ASCVD risk score (Arnett DK, et al., 2019) is: 39.1% ?  Values used to calculate the score: ?    Age: 76 years ?    Sex: Female ?    Is Non-Hispanic African American: No ?    Diabetic: Yes ?    Tobacco smoker: No ?    Systolic Blood Pressure: 256 mmHg ?    Is BP treated: Yes ?    HDL Cholesterol: 41 mg/dL ?    Total Cholesterol: 170 mg/dL ? ?LDL goal is less than 70 ?currently on Crestor 5 mg  ?Start crestor 10mg  daily  ?Avoid fried fatty foods.  ?Lipid panel in 6 weeks.  ?

## 2021-04-25 ENCOUNTER — Encounter: Payer: Self-pay | Admitting: Family Medicine

## 2021-04-25 DIAGNOSIS — J320 Chronic maxillary sinusitis: Secondary | ICD-10-CM | POA: Insufficient documentation

## 2021-04-25 NOTE — Assessment & Plan Note (Signed)
Uncontrolled will add zoloft continue effeor at same dose, recommended therapy no interest expressed at this time, but will definitely benefit ?

## 2021-04-25 NOTE — Progress Notes (Signed)
? ?  ROSAMAE Houston     MRN: [id number]      DOB: [birth year] ? ? ?HPI ?Ms. Petraitis is here with a 1 week h/o cough and scratchy throat, also c/o sinus drainage. No fever or chills, symptoms worsening despite OTC meds, she is diabetic, also notes that she has started wheezing ?C/o being down/frustrated trigger primarily is her very negative spouse who has not changed over time, and she does not expect that this will happen. Her children are supportive, she has been on the same antidepressant for years, symptoms are worse since she is at home. Not suicidal or homicidal ? ?ROS ?Marland Kitchen ?Denies chest pains, palpitations and leg swelling ?Denies abdominal pain, nausea, vomiting,diarrhea or constipation.   ?Denies dysuria, frequency, hesitancy or incontinence. ?Denies joint pain, swelling and limitation in mobility. ?Denies headaches, seizures, numbness, or tingling. ?Denies skin break down or rash. ? ? ?PE ? ?BP (!) 145/69   Pulse 71   Resp 16   Ht 4\' 10"  (1.473 m)   Wt 157 lb 6.4 oz (71.4 kg)   SpO2 93%   BMI 32.90 kg/m?  ? ?Patient alert and oriented and in no cardiopulmonary distress. ? ?HEENT: No facial asymmetry, EOMI,     Neck supple Left maxillary  sinus tenderness ?Decreased though adequate air entry, few scattered wheezes , and no crackles heard. ? ?CVS: S1, S2 no murmurs, no S3.Regular rate. ? ?ABD: Soft non tender.  ? ?Ext: No edema ? ?MS: Adequate ROM spine, shoulders, hips and knees. ? ?Skin: Intact, no ulcerations or rash noted. ? ?Psych: Good eye contact, normal affect. Memory intact mildly anxious and  depressed appearing. ? ?CNS: CN 2-12 intact, power,  normal throughout.no focal deficits noted. ? ? ?Assessment & Plan ? ?Acute bronchitis ?CXR, Z pack, prednisone dose pack, albuterol and tessalon perles ? ?Left maxillary sinusitis ?Z pack prescribed ? ?Depression, major, single episode, complete remission ([place]) ?Uncontrolled will add zoloft continue effeor at same dose, recommended therapy no interest  expressed at this time, but will definitely benefit ? ?Essential hypertension, benign ?DASH diet and commitment to daily physical activity for a minimum of 30 minutes discussed and encouraged, as a part of hypertension management. ?The importance of attaining a healthy weight is also discussed. ?Elevated at visit, may need med adjustment, has appt with PCP in 2 days, also ahs been using OTC meds ? ? ?  04/21/2021  ? 10:11 AM 04/21/2021  ? 10:04 AM 04/19/2021  ?  3:37 PM 04/07/2021  ?  3:14 PM 02/12/2021  ? 10:58 AM 12/16/2020  ? 11:54 AM 12/16/2020  ? 11:04 AM  ?BP/Weight  ?Systolic BP 413 244 010 272 169 170 187  ?Diastolic BP 68 64 69 73 83 80 71  ?Wt. (Lbs)  155 157.4    165  ?BMI  32.4 kg/m2 32.9 kg/m2    34.49 kg/m2  ? ? ? ? ? ?

## 2021-04-25 NOTE — Assessment & Plan Note (Signed)
CXR, Z pack, prednisone dose pack, albuterol and tessalon perles ?

## 2021-04-25 NOTE — Assessment & Plan Note (Signed)
Z pack prescribed 

## 2021-04-25 NOTE — Assessment & Plan Note (Signed)
DASH diet and commitment to daily physical activity for a minimum of 30 minutes discussed and encouraged, as a part of hypertension management. ?The importance of attaining a healthy weight is also discussed. ?Elevated at visit, may need med adjustment, has appt with PCP in 2 days, also ahs been using OTC meds ? ? ?  04/21/2021  ? 10:11 AM 04/21/2021  ? 10:04 AM 04/19/2021  ?  3:37 PM 04/07/2021  ?  3:14 PM 02/12/2021  ? 10:58 AM 12/16/2020  ? 11:54 AM 12/16/2020  ? 11:04 AM  ?BP/Weight  ?Systolic BP 329 191 660 600 169 170 187  ?Diastolic BP 68 64 69 73 83 80 71  ?Wt. (Lbs)  155 157.4    165  ?BMI  32.4 kg/m2 32.9 kg/m2    34.49 kg/m2  ? ? ? ? ?

## 2021-05-05 ENCOUNTER — Telehealth: Payer: PPO | Admitting: Family Medicine

## 2021-05-06 ENCOUNTER — Encounter: Payer: Self-pay | Admitting: Family Medicine

## 2021-05-06 ENCOUNTER — Ambulatory Visit (INDEPENDENT_AMBULATORY_CARE_PROVIDER_SITE_OTHER): Payer: PPO | Admitting: Family Medicine

## 2021-05-06 DIAGNOSIS — E1165 Type 2 diabetes mellitus with hyperglycemia: Secondary | ICD-10-CM | POA: Diagnosis not present

## 2021-05-06 DIAGNOSIS — M109 Gout, unspecified: Secondary | ICD-10-CM

## 2021-05-06 DIAGNOSIS — M79674 Pain in right toe(s): Secondary | ICD-10-CM | POA: Diagnosis not present

## 2021-05-06 MED ORDER — PREDNISONE 20 MG PO TABS: ORAL_TABLET | ORAL | 0 refills | Status: DC

## 2021-05-06 MED ORDER — LANCETS 30G MISC
5 refills | Status: AC
Start: 2021-05-06 — End: ?

## 2021-05-06 MED ORDER — GLUCOSE BLOOD VI STRP
ORAL_STRIP | 12 refills | Status: AC
Start: 2021-05-06 — End: ?

## 2021-05-06 MED ORDER — BLOOD GLUCOSE METER KIT
PACK | 0 refills | Status: DC
Start: 2021-05-06 — End: 2023-07-03

## 2021-05-06 NOTE — Patient Instructions (Addendum)
F/U as before, call if you need me sooner ? ?Prednisone is prescribed for 5 days for acute gout of right great toe.. ? ? ?Please come to the lab this afternoon to have blood drawn for uric acid le\vel. If this is high, you will need medication, to lower this to reduce chance of recurrent flares ( allopurinol) ? ?Nurse please order glucometer and once daily testing supplies and provide diabetic log book ? ?Please test once daily every morning before breakfast and record ? ?Goal for fasting blood sugar ranges from 80 to 120  ? ?Thanks for choosing [facility], we consider it a privelige to serve you. ? ? ? ?

## 2021-05-06 NOTE — Progress Notes (Signed)
Virtual Visit via Telephone Note ? ?I connected with Amy Houston on 05/06/21 at  1:20 PM EDT by telephone and verified that I am speaking with the correct person using two identifiers. ? ?Location: ?Patient: home ?Provider: office ?  ?I discussed the limitations, risks, security and privacy concerns of performing an evaluation and management service by telephone and the availability of in person appointments. I also discussed with the patient that there may be a patient responsible charge related to this service. The patient expressed understanding and agreed to proceed. ? ? ?History of Present Illness: ? ? 3 day  h/o pain , swelling and redness and warmth of right great toe, unprovoked, 3rd episode since 02/2021, seen in ED on the 2 previous occasions, uric acid level not checked ?Observations/Objective: ?There were no vitals taken for this visit. ?Good communication with no confusion and intact memory. ?Alert and oriented x 3 ?No signs of respiratory distress during speech ?Photo sent of affected toe, red, and swollen ? ?Assessment and Plan: ? ? ?Follow Up Instructions: ? ?  ?I discussed the assessment and treatment plan with the patient. The patient was provided an opportunity to ask questions and all were answered. The patient agreed with the plan and demonstrated an understanding of the instructions. ?  ?The patient was advised to call back or seek an in-person evaluation if the symptoms worsen or if the condition fails to improve as anticipated. ? ?I provided 13 minutes of non-face-to-face time during this encounter. ? ? ?Tula Nakayama, MD ? ?

## 2021-05-07 LAB — URIC ACID: Uric Acid: 4.2 mg/dL (ref 3.1–7.9)

## 2021-05-09 ENCOUNTER — Encounter: Payer: Self-pay | Admitting: Family Medicine

## 2021-05-09 DIAGNOSIS — M79674 Pain in right toe(s): Secondary | ICD-10-CM | POA: Insufficient documentation

## 2021-05-09 NOTE — Assessment & Plan Note (Signed)
Amy Houston is reminded of the importance of commitment to daily physical activity for 30 minutes or more, as able and the need to limit carbohydrate intake to 30 to 60 grams per meal to help with blood sugar control.  ? ?The need to take medication as prescribed, test blood sugar as directed, and to call between visits if there is a concern that blood sugar is uncontrolled is also discussed.  ? ?Amy Houston is reminded of the importance of daily foot exam, annual eye examination, and good blood sugar, blood pressure and cholesterol control. ? ? ?  Latest Ref Rng & Units 04/20/2021  ? 10:15 AM 12/16/2020  ? 12:09 PM 08/09/2020  ? 10:06 AM 04/29/2020  ?  9:19 AM 03/12/2020  ? 10:46 AM  ?Diabetic Labs  ?HbA1c 4.8 - 5.6 % 7.5   7.7    7.3     ?Chol 100 - 199 mg/dL 170   199       ?HDL >39 mg/dL 41   42       ?Calc LDL 0 - 99 mg/dL 103   130       ?Triglycerides 0 - 149 mg/dL 144   152       ?Creatinine 0.57 - 1.00 mg/dL 0.99   0.87   0.86   0.89   0.82    ? ? ?  04/21/2021  ? 10:11 AM 04/21/2021  ? 10:04 AM 04/19/2021  ?  3:37 PM 04/07/2021  ?  3:14 PM 02/12/2021  ? 10:58 AM 12/16/2020  ? 11:54 AM 12/16/2020  ? 11:04 AM  ?BP/Weight  ?Systolic BP 542 706 237 628 169 170 187  ?Diastolic BP 68 64 69 73 83 80 71  ?Wt. (Lbs)  155 157.4    165  ?BMI  32.4 kg/m2 32.9 kg/m2    34.49 kg/m2  ? ? ?  Latest Ref Rng & Units 04/21/2021  ? 10:00 AM 03/01/2021  ? 12:00 AM  ?Foot/eye exam completion dates  ?Eye Exam No Retinopathy  No Retinopathy       ?Foot Form Completion  Done   ?  ? This result is from an external source.  ? ? ? ? ? ?Needs to star once daily testing, needs improved control ?

## 2021-05-09 NOTE — Assessment & Plan Note (Signed)
3rd episode of pain and swelling of right great toe, short term prednisone and check uri acid level ?

## 2021-05-11 ENCOUNTER — Telehealth: Payer: Self-pay

## 2021-05-11 NOTE — Telephone Encounter (Signed)
Health Examination Certificate ? ?Copied  ?noted ?Sleeved ? ?Forms in brown folder at front desk ?

## 2021-05-13 ENCOUNTER — Ambulatory Visit: Payer: PPO | Admitting: Family Medicine

## 2021-05-18 ENCOUNTER — Telehealth: Payer: Self-pay | Admitting: Family Medicine

## 2021-05-18 LAB — MICROALBUMIN / CREATININE URINE RATIO
Creatinine, Urine: 70.3 mg/dL
Microalb/Creat Ratio: 43 mg/g creat — ABNORMAL HIGH (ref 0–29)
Microalbumin, Urine: 30.5 ug/mL

## 2021-05-18 LAB — BASIC METABOLIC PANEL
BUN/Creatinine Ratio: 15 (ref 12–28)
BUN: 14 mg/dL (ref 8–27)
CO2: 22 mmol/L (ref 20–29)
Calcium: 9.6 mg/dL (ref 8.7–10.3)
Chloride: 105 mmol/L (ref 96–106)
Creatinine, Ser: 0.96 mg/dL (ref 0.57–1.00)
Glucose: 146 mg/dL — ABNORMAL HIGH (ref 70–99)
Potassium: 4.3 mmol/L (ref 3.5–5.2)
Sodium: 141 mmol/L (ref 134–144)
eGFR: 62 mL/min/{1.73_m2} (ref >59)

## 2021-05-18 NOTE — Telephone Encounter (Signed)
Patient aware.

## 2021-05-18 NOTE — Progress Notes (Signed)
Please review results with patient ? ?Her electrolytes are normal ? ?She has moderate leakage of protein in her urine due to her diabetes, patient should take all medications as prescribed to help preserve her kidneys

## 2021-05-18 NOTE — Telephone Encounter (Signed)
Please call regarding lab work - she said she received a call back but I do not see it noted.  Thanks ?

## 2021-05-20 ENCOUNTER — Encounter: Payer: Self-pay | Admitting: Family Medicine

## 2021-05-20 ENCOUNTER — Ambulatory Visit (INDEPENDENT_AMBULATORY_CARE_PROVIDER_SITE_OTHER): Payer: PPO | Admitting: Family Medicine

## 2021-05-20 VITALS — BP 124/72 | HR 64 | Ht <= 58 in | Wt 158.1 lb

## 2021-05-20 DIAGNOSIS — E1165 Type 2 diabetes mellitus with hyperglycemia: Secondary | ICD-10-CM | POA: Diagnosis not present

## 2021-05-20 DIAGNOSIS — M79674 Pain in right toe(s): Secondary | ICD-10-CM | POA: Diagnosis not present

## 2021-05-20 DIAGNOSIS — Z111 Encounter for screening for respiratory tuberculosis: Secondary | ICD-10-CM

## 2021-05-20 DIAGNOSIS — F419 Anxiety disorder, unspecified: Secondary | ICD-10-CM | POA: Diagnosis not present

## 2021-05-20 DIAGNOSIS — E559 Vitamin D deficiency, unspecified: Secondary | ICD-10-CM

## 2021-05-20 DIAGNOSIS — Z0000 Encounter for general adult medical examination without abnormal findings: Secondary | ICD-10-CM | POA: Diagnosis not present

## 2021-05-20 DIAGNOSIS — R7611 Nonspecific reaction to tuberculin skin test without active tuberculosis: Secondary | ICD-10-CM

## 2021-05-20 NOTE — Progress Notes (Signed)
Amy Houston     MRN: [id number]      DOB: 1945-06-29  HPI: Patient is in for annual physical exam. C/o anxiety about end of life issues. 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 Recurrent great toe pain and swelling, clinically gout but normal uric acid level, rheumatology to evaluate  Uncontrolled diabetes, improving gradually now on medication dose increase and checking blood sugars , refer to educator Recent labs,  are reviewed. Immunization is reviewed , and  needs to be updated at pharmacy   PE: BP 124/72   Pulse 64   Ht 4\' 10"  (1.473 m)   Wt 158 lb 1.9 oz (71.7 kg)   SpO2 96%   BMI 33.05 kg/m   Pleasant  female, alert and oriented x 3, in no cardio-pulmonary distress. Afebrile. HEENT No facial trauma or asymetry. Sinuses non tender.  Extra occullar muscles intact.. External ears normal, . Neck: supple, no adenopathy,JVD or thyromegaly.No bruits.  Chest: Clear to ascultation bilaterally.No crackles or wheezes. Non tender to palpation    Cardiovascular system; Heart sounds normal,  S1 and  S2 ,no S3.  No murmur, or thrill.  Peripheral pulses normal.  Abdomen: Soft, non tender     Musculoskeletal exam: Full ROM of spine, hips , shoulders and knees. No deformity ,swelling or crepitus noted. No muscle wasting or atrophy.   Neurologic: Cranial nerves 2 to 12 intact. Power, tone ,sensation  normal throughout. No disturbance in gait. No tremor.  Skin: Intact, no ulceration, erythema , scaling or rash noted. Pigmentation normal throughout  Psych; Normal mood and affect. Judgement and concentration normal   Assessment & Plan:  Uncontrolled type 2 diabetes mellitus with hyperglycemia, without long-term current use of insulin  ([place]) Amy Houston is reminded of the importance of commitment to daily physical activity for 30 minutes or more, as able and the need to limit carbohydrate intake to 30 to 60 grams per meal to help with blood sugar control.  Uncontrolled , needs  Dietary counselling  The need to take medication as prescribed, test blood sugar as directed, and to call between visits if there is a concern that blood sugar is uncontrolled is also discussed.   Amy Houston is reminded of the importance of daily foot exam, annual eye examination, and good blood sugar, blood pressure and cholesterol control.     Latest Ref Rng & Units 05/16/2021    9:38 AM 04/20/2021   10:15 AM 12/16/2020   12:09 PM 08/09/2020   10:06 AM 04/29/2020    9:19 AM  Diabetic Labs  HbA1c 4.8 - 5.6 %  7.5   7.7    7.3    Micro/Creat Ratio 0 - 29 mg/g creat 43        Chol 100 - 199 mg/dL  170   199      HDL >39 mg/dL  41   42      Calc LDL 0 - 99 mg/dL  103   130      Triglycerides 0 - 149 mg/dL  144   152      Creatinine 0.57 - 1.00 mg/dL 0.96   0.99   0.87   0.86   0.89        05/20/2021  9:26 AM 04/21/2021   10:11 AM 04/21/2021   10:04 AM 04/19/2021    3:37 PM 04/07/2021    3:14 PM 02/12/2021   10:58 AM 12/16/2020   11:54 AM  BP/Weight  Systolic BP 102 725 366 440 347 425 956  Diastolic BP 72 68 64 69 73 83 80  Wt. (Lbs) 158.12  155 157.4     BMI 33.05 kg/m2  32.4 kg/m2 32.9 kg/m2         Latest Ref Rng & Units 04/21/2021   10:00 AM 03/01/2021   12:00 AM  Foot/eye exam completion dates  Eye Exam No Retinopathy  No Retinopathy       Foot Form Completion  Done      This result is from an external source.        Annual visit for general adult medical examination without abnormal findings Annual exam as documented. Counseling done  re healthy lifestyle involving commitment to 150 minutes exercise per week, heart healthy diet, and attaining healthy weight.The importance of adequate sleep also discussed. . Changes in health  habits are decided on by the patient with goals and time frames  set for achieving them. Immunization and cancer screening needs are specifically addressed at this visit.   Positive PPD, treated Quantiferon test ordered, needs clearance for school employment  Anxiety Requests referral to therapist as she copes with her mortality and dying, getting her affairs in order, not fond of the aging process. No specific health concerns triggering her fear  Pain of right great toe Recurrent right great toe pain and swelling with normal uric acid level, refer to rheumatology for e/m

## 2021-05-20 NOTE — Patient Instructions (Addendum)
F/U last week in July, call if you need me sooner  Nurse please record vision screem with correction , has prescription glasses  TB lab test to be ordered   Goal for fasting blood sugar ranges from 80 to 120 and 2 hours after any meal or at bedtime should be between 130 to 170.   Nurse please refer to diabetic educator and provide her with diabetic log book/ sheet   I will refer you to therapist re anxiety  HBA1C, fasting lipid, cmp and EGFr and vit D July 20 or after  It is important that you exercise regularly at least 30 minutes 5 times a week. If you develop chest pain, have severe difficulty breathing, or feel very tired, stop exercising immediately and seek medical attention  Think about what you will eat, plan ahead. Choose " clean, green, fresh or frozen" over canned, processed or packaged foods which are more sugary, salty and fatty. 70 to 75% of food eaten should be vegetables and fruit. Three meals at set times with snacks allowed between meals, but they must be fruit or vegetables. Aim to eat over a 12 hour period , example 7 am to 7 pm, and STOP after  your last meal of the day. Drink water,generally about 64 ounces per day, no other drink is as healthy. Fruit juice is best enjoyed in a healthy way, by EATING the fruit. Thanks for choosing [facility], we consider it a privelige to serve you.

## 2021-05-20 NOTE — Assessment & Plan Note (Addendum)
Ms. Mcquitty is reminded of the importance of commitment to daily physical activity for 30 minutes or more, as able and the need to limit carbohydrate intake to 30 to 60 grams per meal to help with blood sugar control.  Uncontrolled , needs  Dietary counselling  The need to take medication as prescribed, test blood sugar as directed, and to call between visits if there is a concern that blood sugar is uncontrolled is also discussed.   Ms. Ballester is reminded of the importance of daily foot exam, annual eye examination, and good blood sugar, blood pressure and cholesterol control.     Latest Ref Rng & Units 05/16/2021    9:38 AM 04/20/2021   10:15 AM 12/16/2020   12:09 PM 08/09/2020   10:06 AM 04/29/2020    9:19 AM  Diabetic Labs  HbA1c 4.8 - 5.6 %  7.5   7.7    7.3    Micro/Creat Ratio 0 - 29 mg/g creat 43        Chol 100 - 199 mg/dL  170   199      HDL >39 mg/dL  41   42      Calc LDL 0 - 99 mg/dL  103   130      Triglycerides 0 - 149 mg/dL  144   152      Creatinine 0.57 - 1.00 mg/dL 0.96   0.99   0.87   0.86   0.89        05/20/2021    9:26 AM 04/21/2021   10:11 AM 04/21/2021   10:04 AM 04/19/2021    3:37 PM 04/07/2021    3:14 PM 02/12/2021   10:58 AM 12/16/2020   11:54 AM  BP/Weight  Systolic BP 284 132 440 102 725 366 440  Diastolic BP 72 68 64 69 73 83 80  Wt. (Lbs) 158.12  155 157.4     BMI 33.05 kg/m2  32.4 kg/m2 32.9 kg/m2         Latest Ref Rng & Units 04/21/2021   10:00 AM 03/01/2021   12:00 AM  Foot/eye exam completion dates  Eye Exam No Retinopathy  No Retinopathy       Foot Form Completion  Done      This result is from an external source.

## 2021-05-22 DIAGNOSIS — Z0000 Encounter for general adult medical examination without abnormal findings: Secondary | ICD-10-CM | POA: Insufficient documentation

## 2021-05-22 DIAGNOSIS — F419 Anxiety disorder, unspecified: Secondary | ICD-10-CM | POA: Insufficient documentation

## 2021-05-22 NOTE — Assessment & Plan Note (Signed)
Requests referral to therapist as she copes with her mortality and dying, getting her affairs in order, not fond of the aging process. No specific health concerns triggering her fear

## 2021-05-22 NOTE — Assessment & Plan Note (Signed)
Recurrent right great toe pain and swelling with normal uric acid level, refer to rheumatology for e/m

## 2021-05-22 NOTE — Assessment & Plan Note (Signed)
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

## 2021-05-22 NOTE — Assessment & Plan Note (Signed)
Annual exam as documented. Counseling done  re healthy lifestyle involving commitment to 150 minutes exercise per week, heart healthy diet, and attaining healthy weight.The importance of adequate sleep also discussed. Changes in health habits are decided on by the patient with goals and time frames  set for achieving them. Immunization and cancer screening needs are specifically addressed at this visit. 

## 2021-05-23 ENCOUNTER — Other Ambulatory Visit: Payer: Self-pay | Admitting: Family Medicine

## 2021-05-24 ENCOUNTER — Telehealth: Payer: Self-pay | Admitting: *Deleted

## 2021-05-24 NOTE — Chronic Care Management (AMB) (Signed)
  Chronic Care Management   Note  05/24/2021 Name: KYNADI DRAGOS MRN: [id number] DOB: 1945/01/17  MAKAIAH TERWILLIGER is a 76 y.o. year old female who is a primary care patient of Moshe Cipro Norwood Levo, MD. I reached out to [name] by phone today in response to a referral sent by Ms. [place] PCP.  Ms. Troyer was given information about Chronic Care Management services today including:  CCM service includes personalized support from designated clinical staff supervised by her physician, including individualized plan of care and coordination with other care providers 24/7 contact phone numbers for assistance for urgent and routine care needs. Service will only be billed when office clinical staff spend 20 minutes or more in a month to coordinate care. Only one practitioner may furnish and bill the service in a calendar month. The patient may stop CCM services at any time (effective at the end of the month) by phone call to the office staff. The patient is responsible for co-pay (up to 20% after annual deductible is met) if co-pay is required by the individual health plan.   Patient agreed to services and verbal consent obtained.   Follow up plan: Telephone appointment with care management team member scheduled for:06/08/21  [place] Management  Direct Dial: [phone]

## 2021-05-25 ENCOUNTER — Telehealth: Payer: Self-pay | Admitting: Family Medicine

## 2021-05-25 NOTE — Telephone Encounter (Signed)
Patient called in for TB lab work

## 2021-05-26 ENCOUNTER — Ambulatory Visit: Payer: PPO | Admitting: Nurse Practitioner

## 2021-05-26 ENCOUNTER — Ambulatory Visit: Payer: PPO | Admitting: Family Medicine

## 2021-05-26 LAB — QUANTIFERON-TB GOLD PLUS
QuantiFERON Mitogen Value: >10 IU/mL
QuantiFERON Nil Value: 0.14 IU/mL
QuantiFERON TB1 Ag Value: 0.06 IU/mL
QuantiFERON TB2 Ag Value: 0.05 IU/mL
QuantiFERON-TB Gold Plus: NEGATIVE

## 2021-05-26 NOTE — Telephone Encounter (Signed)
Forms complete and will call pt to collect

## 2021-05-26 NOTE — Telephone Encounter (Signed)
Patient pick up the form.

## 2021-05-26 NOTE — Telephone Encounter (Signed)
Called patient, patient will pick up forms.

## 2021-06-01 ENCOUNTER — Other Ambulatory Visit: Payer: Self-pay | Admitting: Gastroenterology

## 2021-06-02 ENCOUNTER — Ambulatory Visit: Payer: PPO | Admitting: Family Medicine

## 2021-06-08 ENCOUNTER — Ambulatory Visit (INDEPENDENT_AMBULATORY_CARE_PROVIDER_SITE_OTHER): Payer: PPO | Admitting: Licensed Clinical Social Worker

## 2021-06-08 DIAGNOSIS — F419 Anxiety disorder, unspecified: Secondary | ICD-10-CM

## 2021-06-08 DIAGNOSIS — F325 Major depressive disorder, single episode, in full remission: Secondary | ICD-10-CM

## 2021-06-08 NOTE — Chronic Care Management (AMB) (Signed)
Chronic Care Management   Clinical Social Work Note  06/08/2021 Name: Amy Houston MRN: [id number] DOB: Jul 07, 1945  Amy Houston is a 76 y.o. year old female who is a primary care patient of Moshe Cipro, Norwood Levo, MD. The CCM team was consulted to assist the patient with chronic disease management and/or care coordination needs related to: Mental Health Counseling and Resources.   Engaged with patient by telephone for initial visit in response to provider referral for social work chronic care management and care coordination services.   Consent to Services:  The patient was given the following information about Chronic Care Management services today, agreed to services, and gave verbal consent: 1. CCM service includes personalized support from designated clinical staff supervised by the primary care provider, including individualized plan of care and coordination with other care providers 2. 24/7 contact phone numbers for assistance for urgent and routine care needs. 3. Service will only be billed when office clinical staff spend 20 minutes or more in a month to coordinate care. 4. Only one practitioner may furnish and bill the service in a calendar month. 5.The patient may stop CCM services at any time (effective at the end of the month) by phone call to the office staff. 6. The patient will be responsible for cost sharing (co-pay) of up to 20% of the service fee (after annual deductible is met). Patient agreed to services and consent obtained.  Patient agreed to services and consent obtained.   Summary:  Unable to complete full assessment of needs during this encounter due to patient's schedule. Discussed therapy options base on referral placed by PCP .  See Care Plan below for interventions and patient self-care actives.  Recommendation: Patient may benefit from, and is in agreement for LCSW to make referral to [facility] health.   Follow up Plan: Patient would like continued follow-up  from CCM LCSW.  per patient's request will follow up in 4 weeks after she returns from vacation.  Will call office if needed prior to next encounter.   Assessment: Review of patient past medical history, allergies, medications, and health status, including review of relevant consultants reports was performed today as part of a comprehensive evaluation and provision of chronic care management and care coordination services.     SDOH (Social Determinants of Health) assessments and interventions performed:    Advanced Directives Status: See Vynca application for related entries.  CCM Care Plan Conditions to be addressed/monitored: Anxiety and Depression;   Care Plan : LCSW Plan of Care  Updates made by Maurine Cane, LCSW since 06/08/2021 12:00 AM     Problem: Coping Skills      Goal: Coping Skills Enhanced   Start Date: 06/08/2021  This Visit's Progress: On track  Priority: High  Note:   Current Barriers:  Disease Management support and education needs related to symptoms of anxiety  CSW Clinical Goal(s):  Patient  will demonstrate a reduction in symptoms related to :anxiety  through collaboration with Clinical Education officer, museum, provider, and care team.   Interventions: 1:1 collaboration with primary care provider regarding development and update of comprehensive plan of care as evidenced by provider attestation and co-signature Inter-disciplinary care team collaboration (see longitudinal plan of care) Evaluation of current treatment plan related to  self management and patient's adherence to plan as established by provider  Mental Health:  (Status: New goal.) Evaluation of current treatment plan related to  Anxiety Depression screen reviewed  Solution-Focused Strategies employed:  Discussed therapy options  based on need and insurance  Made referral to [facility] in [place] activities to accomplish goals: I have placed a referral to [facility] in [place] they will contact you.  If you have not heard from them in 2 weeks  please follow up.     Casimer Lanius, LCSW Licensed Clinical Social Worker [facility]  [facility] [phone]

## 2021-06-08 NOTE — Patient Instructions (Signed)
Visit Information   Thank you for taking time to visit with me today. Please don't hesitate to contact me if I can be of assistance to you before our next scheduled telephone appointment.  Following are the goals we discussed today: Connecting for therapy   Our next appointment is by telephone on July 5th at 10:30  Please call the care guide team at [phone] if you need to cancel or reschedule your appointment.   If you are experiencing a Mental Health or [place] or need someone to talk to, please call the Suicide and Crisis Lifeline: 988 call 1-800-273-TALK (toll free, 24 hour hotline)   Following is a copy of your full care plan:  Care Plan : LCSW Plan of Care  Updates made by Maurine Cane, LCSW since 06/08/2021 12:00 AM     Problem: Coping Skills      Goal: Coping Skills Enhanced   Start Date: 06/08/2021  This Visit's Progress: On track  Priority: High  Note:   Current Barriers:  Disease Management support and education needs related to symptoms of anxiety  CSW Clinical Goal(s):  Patient  will demonstrate a reduction in symptoms related to :anxiety  through collaboration with Clinical Education officer, museum, provider, and care team.   Interventions: 1:1 collaboration with primary care provider regarding development and update of comprehensive plan of care as evidenced by provider attestation and co-signature Inter-disciplinary care team collaboration (see longitudinal plan of care) Evaluation of current treatment plan related to  self management and patient's adherence to plan as established by provider  Mental Health:  (Status: New goal.) Evaluation of current treatment plan related to  Anxiety Depression screen reviewed  Solution-Focused Strategies employed:  Discussed therapy options based on need and insurance  Made referral to [facility] in [place]  Task & activities to accomplish goals: I have placed a referral to [facility] in [place] they will contact you.  If you have not heard from them in 2 weeks  please follow up.    Consent to CCM Services: Amy Houston was given information about Chronic Care Management services including:  CCM service includes personalized support from designated clinical staff supervised by her physician, including individualized plan of care and coordination with other care providers 24/7 contact phone numbers for assistance for urgent and routine care needs. Service will only be billed when office clinical staff spend 20 minutes or more in a month to coordinate care. Only one practitioner may furnish and bill the service in a calendar month. The patient may stop CCM services at any time (effective at the end of the month) by phone call to the office staff. The patient will be responsible for cost sharing (co-pay) of up to 20% of the service fee (after annual deductible is met).  Patient agreed to services and verbal consent obtained.   Patient verbalizes understanding of instructions and care plan provided today and agrees to view in [place]. Active MyChart status and patient understanding of how to access instructions and care plan via MyChart confirmed with patient.     Amy Lanius, LCSW Licensed Clinical Social Worker [facility]  [facility] [phone]

## 2021-06-30 ENCOUNTER — Other Ambulatory Visit: Payer: Self-pay | Admitting: Family Medicine

## 2021-06-30 DIAGNOSIS — I10 Essential (primary) hypertension: Secondary | ICD-10-CM

## 2021-07-01 DIAGNOSIS — F325 Major depressive disorder, single episode, in full remission: Secondary | ICD-10-CM

## 2021-07-02 ENCOUNTER — Other Ambulatory Visit: Payer: Self-pay | Admitting: Family Medicine

## 2021-07-06 ENCOUNTER — Ambulatory Visit (INDEPENDENT_AMBULATORY_CARE_PROVIDER_SITE_OTHER): Payer: PPO | Admitting: Licensed Clinical Social Worker

## 2021-07-06 DIAGNOSIS — F419 Anxiety disorder, unspecified: Secondary | ICD-10-CM

## 2021-07-06 DIAGNOSIS — F325 Major depressive disorder, single episode, in full remission: Secondary | ICD-10-CM

## 2021-07-06 NOTE — Patient Instructions (Signed)
Visit Information  Thank you for taking time to visit with me today. Please don't hesitate to contact me if I can be of assistance to you before our next scheduled telephone appointment.  Following are the goals we discussed today: Connecting for counseling  Task & activities to accomplish goals: Call [facility] in [place] to schedule your counseling appointment. Continue with compliance of taking medication prescribed by Doctor Continue with your self-care action plan Theodoro Kos, family and Friend events) Review your EMMI educational information (Managing Anxiety Around Daily Task; Depression; Relieving Stress) Look for an e-mail from [place].      Casimer Lanius, LCSW Licensed Clinical Social Worker [facility]  [facility] [phone]     Our next appointment is by telephone on July 19th at 12:00  Please call the care guide team at [phone] if you need to cancel or reschedule your appointment.   If you are experiencing a Mental Health or [place] or need someone to talk to, please call the Suicide and Crisis Lifeline: 988 call 1-800-273-TALK (toll free, 24 hour hotline) call the [facility]: [phone]   Patient verbalizes understanding of instructions and care plan provided today and agrees to view in [place]. Active MyChart status and patient understanding of how to access instructions and care plan via MyChart confirmed with patient.

## 2021-07-06 NOTE — Chronic Care Management (AMB) (Signed)
Chronic Care Management   Clinical Social Work Note  07/06/2021 Name: Amy Houston MRN: [id number] DOB: 10-01-1945  [name] is a 76 y.o. year old female who is a primary care patient of Amy [place], Amy Levo, MD. The CCM team was consulted to assist the patient with chronic disease management and/or care coordination needs related to: Mental Health Counseling and Resources.   Engaged with patient by telephone for follow up visit in response to provider referral for social work chronic care management and care coordination services.   Consent to Services:  The patient was given information about Chronic Care Management services, agreed to services, and gave verbal consent prior to initiation of services.  Please see initial visit note for detailed documentation.   Patient agreed to services and consent obtained.   Summary:  Patient is making progress with managing symptoms of depression and anxiety by going on vacation with her husband of 23 yrs and connecting with family.  She is active with her church but reports not feeling motivated to do things like cooking etc. . .  See Care Plan below for interventions and patient self-care actives.  Recommendation: Patient may benefit from, and is in agreement to call [facility] [place] [facility] behavioral [facility] in [place] to schedule her appointment.   Follow up Plan: Patient would like continued follow-up from CCM LCSW.  per patient's request will follow up in 2 weeks.  Will call office if needed prior to next encounter.   Assessment: Review of patient past medical history, allergies, medications, and health status, including review of relevant consultants reports was performed today as part of a comprehensive evaluation and provision of chronic care management and care coordination services.     SDOH (Social Determinants of Health) assessments and interventions performed:    Advanced Directives Status: See Vynca application for related  entries.  CCM Care Plan Conditions to be addressed/monitored: Anxiety and Depression;   Care Plan : LCSW Plan of Care  Updates made by Amy Cane, LCSW since 07/06/2021 12:00 AM     Problem: Coping Skills      Goal: Coping Skills Enhanced   Start Date: 06/08/2021  This Visit's Progress: On track  Recent Progress: On track  Priority: High  Note:   Current Barriers:  Disease Management support and education needs related to symptoms of anxiety  CSW Clinical Goal(s):  Patient  will demonstrate a reduction in symptoms related to :anxiety  through collaboration with Clinical Education officer, museum, provider, and care team.   Interventions: 1:1 collaboration with primary care provider regarding development and update of comprehensive plan of care as evidenced by provider attestation and co-signature Inter-disciplinary care team collaboration (see longitudinal plan of care) Evaluation of current treatment plan related to  self management and patient's adherence to plan as established by provider  Mental Health:  (Status: Goal on Track (progressing): YES.) Evaluation of current treatment plan related to Depression: anxiety Solution-Focused Strategies employed:  Active listening / Reflection utilized  Problem [place] strategies reviewed Provided psychoeducation for mental health needs  Reviewed mental health medications and discussed importance of compliance: continues to take effexor has taken it for 20 yrs Discussed therapy options based on need and insurance  Provided [place] education information on Managing Anxiety around Daily Tasks; Depression: Relieving Stress Made referral to [facility] in [place]  Task & activities to accomplish goals: Call Amy [place] in [place] to schedule your counseling appointment. Continue with compliance of taking medication  prescribed by Doctor Continue with your self-care action  plan [place], family and Friend events) Review your [place] educational information (Managing Anxiety Around Daily Task; Depression; Relieving Stress) Look for an e-mail from [place].     Amy Lanius, LCSW Licensed Clinical Social Worker [facility]  [facility] [phone]

## 2021-07-20 ENCOUNTER — Ambulatory Visit: Payer: PPO | Admitting: Licensed Clinical Social Worker

## 2021-07-20 DIAGNOSIS — F419 Anxiety disorder, unspecified: Secondary | ICD-10-CM

## 2021-07-20 DIAGNOSIS — F325 Major depressive disorder, single episode, in full remission: Secondary | ICD-10-CM

## 2021-07-20 NOTE — Chronic Care Management (AMB) (Signed)
  Chronic Care Management   Clinical Social Work Note  07/20/2021 Name: Amy [place] MRN: [id number] DOB: 04/27/1945  [name] is a 76 y.o. year old female who is a primary care patient of Moshe Cipro, Norwood Levo, MD. The CCM team was consulted to assist the patient with chronic disease management and/or care coordination needs related to: Mental Health Counseling and Resources.   Engaged with patient by telephone for follow up visit in response to provider referral for social work chronic care management and care coordination services.   Consent to Services:  The patient was given information about Chronic Care Management services, agreed to services, and gave verbal consent prior to initiation of services.  Please see initial visit note for detailed documentation.   Patient agreed to services and consent obtained.   Summary:  Patient is making progress with managing symptoms of depression, she has initial therapy appointment Aug 3rd  with [facility] in [place].  See Care Plan below for interventions and patient self-care actives.  Recommendation: Patient may benefit from, and is in agreement to keep therapy appointments.   No Follow up Scheduled:  All care plan goals have been met. Will disconnect from care team after this encounter. Patient  has been informed to contact the office if new needs arise. Patient does not require continued follow-up by CCM LCSW. Will contact the office if needed    Assessment: Review of patient past medical history, allergies, medications, and health status, including review of relevant consultants reports was performed today as part of a comprehensive evaluation and provision of chronic care management and care coordination services.     SDOH (Social Determinants of Health) assessments and interventions performed:    Advanced Directives Status: Not addressed in this encounter.  CCM Care Plan Conditions to be  addressed/monitored: Depression;   Care Plan : LCSW Plan of Care  Updates made by Maurine Cane, LCSW since 07/20/2021 12:00 AM  Completed 07/20/2021   Problem: Coping Skills Resolved 07/20/2021     Goal: Coping Skills Enhanced by connecting for ongoing therapy Completed 07/20/2021  Start Date: 06/08/2021  This Visit's Progress: On track  Recent Progress: On track  Priority: High  Note:   Current Barriers:  Disease Management support and education needs related to symptoms of anxiety  CSW Clinical Goal(s):  Patient  will demonstrate a reduction in symptoms related to :anxiety  through collaboration with Clinical Education officer, museum, provider, and care team.   Interventions: 1:1 collaboration with primary care provider regarding development and update of comprehensive plan of care as evidenced by provider attestation and co-signature Inter-disciplinary care team collaboration (see longitudinal plan of care) Evaluation of current treatment plan related to  self management and patient's adherence to plan as established by provider  Mental Health:  (Status: Goal on Track (progressing): YES. Goal Met.) Evaluation of current treatment plan related to Depression: anxiety Solution-Focused Strategies employed:  Active listening / Reflection utilized  Problem Reading strategies reviewed Provided psychoeducation for mental health needs   Task & activities to accomplish goals: Keep appointment with [facility] in [place] with compliance of taking medication prescribed by Doctor Continue with your self-care action plan [employer], family and Friend events) Review your EMMI educational information (Managing Anxiety Around Daily Task; Depression; Relieving Stress) Look for an e-mail from [employer].     Casimer Lanius, LCSW Licensed Clinical Social Worker [facility] [phone]

## 2021-07-20 NOTE — Patient Instructions (Signed)
Visit Information  Thank you for taking time to visit with me today. Please don't hesitate to contact me if I can be of assistance to you before our next scheduled telephone appointment.  Following are the goals we discussed today: going to therapy  Keep appointment with [facility] in [place] with compliance of taking medication prescribed by Doctor Continue with your self-care action plan [facility], family and Friend events) Review your EMMI educational information (Managing Anxiety Around Daily Task; Depression; Relieving Stress) Look for an e-mail from [facility]  No follow up scheduled, per our conversation you do not require continued follow up I will disconnect from your care team at this time, please call the office if additional needs are identified   Casimer Lanius, LCSW Licensed Clinical Social Worker [facility]  [facility] [phone]   If you are experiencing a [place] or [place] or need someone to talk to, please call the Suicide and Crisis Lifeline: 988 call the [place] National Suicide Prevention Lifeline: 251-710-2435 or TTY: 309-204-3851 TTY 3211576656) to talk to a trained counselor call 1-800-273-TALK (toll free, 24 hour hotline) call the [facility]: [phone]) [phone]   Patient verbalizes understanding of instructions and care plan provided today and agrees to view in [place]. Active MyChart status and patient understanding of how to access instructions and care plan via MyChart confirmed with patient.

## 2021-07-21 LAB — LIPID PANEL
Chol/HDL Ratio: 2.9 ratio (ref 0.0–4.4)
Cholesterol, Total: 117 mg/dL (ref 100–199)
HDL: 41 mg/dL (ref >39)
LDL Chol Calc (NIH): 50 mg/dL (ref 0–99)
Triglycerides: 156 mg/dL — ABNORMAL HIGH (ref 0–149)
VLDL Cholesterol Cal: 26 mg/dL (ref 5–40)

## 2021-07-21 LAB — CMP14+EGFR
ALT: 16 IU/L (ref 0–32)
AST: 18 IU/L (ref 0–40)
Albumin/Globulin Ratio: 2 (ref 1.2–2.2)
Albumin: 4.4 g/dL (ref 3.8–4.8)
Alkaline Phosphatase: 73 IU/L (ref 44–121)
BUN/Creatinine Ratio: 19 (ref 12–28)
BUN: 16 mg/dL (ref 8–27)
Bilirubin Total: 0.4 mg/dL (ref 0.0–1.2)
CO2: 24 mmol/L (ref 20–29)
Calcium: 10.1 mg/dL (ref 8.7–10.3)
Chloride: 108 mmol/L — ABNORMAL HIGH (ref 96–106)
Creatinine, Ser: 0.86 mg/dL (ref 0.57–1.00)
Globulin, Total: 2.2 g/dL (ref 1.5–4.5)
Glucose: 135 mg/dL — ABNORMAL HIGH (ref 70–99)
Potassium: 4.5 mmol/L (ref 3.5–5.2)
Sodium: 144 mmol/L (ref 134–144)
Total Protein: 6.6 g/dL (ref 6.0–8.5)
eGFR: 70 mL/min/{1.73_m2} (ref >59)

## 2021-07-21 LAB — HEMOGLOBIN A1C
Est. average glucose Bld gHb Est-mCnc: 151 mg/dL
Hgb A1c MFr Bld: 6.9 % — ABNORMAL HIGH (ref 4.8–5.6)

## 2021-07-21 LAB — VITAMIN D 25 HYDROXY (VIT D DEFICIENCY, FRACTURES): Vit D, 25-Hydroxy: 52.4 ng/mL (ref 30.0–100.0)

## 2021-07-26 ENCOUNTER — Ambulatory Visit (HOSPITAL_COMMUNITY)
Admission: RE | Admit: 2021-07-26 | Discharge: 2021-07-26 | Disposition: A | Discharge status: Home / Self Care | Payer: PPO | Source: Ambulatory Visit | Attending: Family Medicine | Admitting: Family Medicine

## 2021-07-26 ENCOUNTER — Ambulatory Visit (INDEPENDENT_AMBULATORY_CARE_PROVIDER_SITE_OTHER): Payer: PPO | Admitting: Family Medicine

## 2021-07-26 ENCOUNTER — Encounter: Payer: Self-pay | Admitting: Family Medicine

## 2021-07-26 VITALS — BP 123/49 | HR 57 | Ht <= 58 in | Wt 158.0 lb

## 2021-07-26 DIAGNOSIS — E1159 Type 2 diabetes mellitus with other circulatory complications: Secondary | ICD-10-CM

## 2021-07-26 DIAGNOSIS — Z818 Family history of other mental and behavioral disorders: Secondary | ICD-10-CM

## 2021-07-26 DIAGNOSIS — E785 Hyperlipidemia, unspecified: Secondary | ICD-10-CM

## 2021-07-26 DIAGNOSIS — M79674 Pain in right toe(s): Secondary | ICD-10-CM

## 2021-07-26 DIAGNOSIS — R6889 Other general symptoms and signs: Secondary | ICD-10-CM

## 2021-07-26 DIAGNOSIS — E559 Vitamin D deficiency, unspecified: Secondary | ICD-10-CM

## 2021-07-26 DIAGNOSIS — F322 Major depressive disorder, single episode, severe without psychotic features: Secondary | ICD-10-CM

## 2021-07-26 DIAGNOSIS — I10 Essential (primary) hypertension: Secondary | ICD-10-CM

## 2021-07-26 DIAGNOSIS — G3184 Mild cognitive impairment, so stated: Secondary | ICD-10-CM | POA: Diagnosis not present

## 2021-07-26 DIAGNOSIS — E1165 Type 2 diabetes mellitus with hyperglycemia: Secondary | ICD-10-CM

## 2021-07-26 NOTE — Patient Instructions (Addendum)
F/U in 4 months, re evaluate chronic problems and flu vaccine , call if you need me sooner  You will be referred for Brain scan  Please get shingrix vaccines at your pharmacy  I will refer you to Psychiatry when available locally, please send a message end August about this if you have not heard from [place].  X ray of toe today  Fasting lipid, cmp and EGFR and HBA1C, CBC and TSH 1 week before next appt  Thanks for choosing [facility], we consider it a privelige to serve you.

## 2021-07-27 ENCOUNTER — Encounter: Payer: Self-pay | Admitting: Nutrition

## 2021-07-27 ENCOUNTER — Telehealth: Payer: Self-pay | Admitting: Nutrition

## 2021-07-27 ENCOUNTER — Encounter: Payer: PPO | Attending: Family Medicine | Admitting: Nutrition

## 2021-07-27 ENCOUNTER — Encounter: Payer: Self-pay | Admitting: Family Medicine

## 2021-07-27 DIAGNOSIS — E1165 Type 2 diabetes mellitus with hyperglycemia: Secondary | ICD-10-CM | POA: Diagnosis present

## 2021-07-27 DIAGNOSIS — F320 Major depressive disorder, single episode, mild: Secondary | ICD-10-CM

## 2021-07-27 DIAGNOSIS — Z713 Dietary counseling and surveillance: Secondary | ICD-10-CM | POA: Insufficient documentation

## 2021-07-27 DIAGNOSIS — I10 Essential (primary) hypertension: Secondary | ICD-10-CM

## 2021-07-27 DIAGNOSIS — E782 Mixed hyperlipidemia: Secondary | ICD-10-CM

## 2021-07-27 NOTE — Assessment & Plan Note (Signed)
Needs referral to Psychiatry , waiting on new local Provider, not suicidal or homicidal

## 2021-07-27 NOTE — Progress Notes (Signed)
Amy [place]     MRN: [id number]      DOB: 11-02-1945   HPI [place] is here for follow up and re-evaluation of chronic medical conditions, medication management and review of any available recent lab and radiology data.  Preventive health is updated, specifically  Cancer screening and Immunization.   Questions or concerns regarding consultations or procedures which the PT has had in the interim are  addressed. The PT denies any adverse reactions to current medications since the last visit.  C/o forgetfullness and increasing difficulty with decision making and she has a sibling with MCI, and recnet imaging showed early/ advanced vascular disease in her brain, also reports father had dementia, she is concerned and wants a bran scan if indicated Denies recurrent toe pain, rheumatolgy appointment several months out Not motivated to exorcise , has to ush herself to het up and be active daily, feel as though effexor is not as effective as se would wish , not suicidal or homicidal , has been on max dose for "years" Denies polyuria, polydipsia, blurred vision , or hypoglycemic episodes.   ROS Denies recent fever or chills. Denies sinus pressure, nasal congestion, ear pain or sore throat. Denies chest congestion, productive cough or wheezing. Denies chest pains, palpitations and leg swelling Denies abdominal pain, nausea, vomiting,diarrhea or constipation.   Denies dysuria, frequency, hesitancy or incontinence. Denies headaches, seizures, numbness, or tingling. Denies depression, anxiety or insomnia. Denies skin break down or rash.   PE  BP (!) 123/49 (BP Location: Right Arm, Patient Position: Sitting, Cuff Size: Large)   Pulse (!) 57   Ht 4\' 10"  (1.473 m)   Wt 158 lb 0.6 oz (71.7 kg)   SpO2 94%   BMI 33.03 kg/m   Patient alert and oriented and in no cardiopulmonary distress.  HEENT: No facial asymmetry, EOMI,     Neck supple .  Chest: Clear to auscultation  bilaterally.  CVS: S1, S2 no murmurs, no S3.Regular rate.  ABD: Soft non tender.   Ext: No edema  MS: decreased ROM spine, shoulders, hips and knees.  Skin: Intact, no ulcerations or rash noted.  Psych: Good eye contact, normal affect. Memory intact not anxious or depressed appearing.  CNS: CN 2-12 intact, power,  normal throughout.no focal deficits noted.   Assessment & Plan  Depression, major, single episode, severe ([place]) Needs referral to Psychiatry , waiting on new local Provider, not suicidal or homicidal  Essential hypertension, benign Controlled, no change in medication DASH diet and commitment to daily physical activity for a minimum of 30 minutes discussed and encouraged, as a part of hypertension management. The importance of attaining a healthy weight is also discussed.     07/26/2021   10:17 AM 05/20/2021    9:26 AM 04/21/2021   10:11 AM 04/21/2021   10:04 AM 04/19/2021    3:37 PM 04/07/2021    3:14 PM 02/12/2021   10:58 AM  BP/Weight  Systolic BP 024 097 353 299 242 683 419  Diastolic BP 49 72 68 64 69 73 83  Wt. (Lbs) 158.04 158.12  155 157.4    BMI 33.03 kg/m2 33.05 kg/m2  32.4 kg/m2 32.9 kg/m2         MCI (mild cognitive impairment) Reports forgetfulness and dififculty with decision making, though MMSE is normal, will order brain scan to assess, has f/h of dementia and is very concerned  Type 2 diabetes mellitus with vascular disease ([place]) [place] is reminded of the importance  of commitment to daily physical activity for 30 minutes or more, as able and the need to limit carbohydrate intake to 30 to 60 grams per meal to help with blood sugar control.   The need to take medication as prescribed, test blood sugar as directed, and to call between visits if there is a concern that blood sugar is uncontrolled is also discussed. Improved and controlled currently   Amy [place] is reminded of the importance of daily foot exam, annual eye examination, and good  blood sugar, blood pressure and cholesterol control.     Latest Ref Rng & Units 07/20/2021   11:19 AM 05/16/2021    9:38 AM 04/20/2021   10:15 AM 12/16/2020   12:09 PM 08/09/2020   10:06 AM  Diabetic Labs  HbA1c 4.8 - 5.6 % 6.9   7.5  7.7    Micro/Creat Ratio 0 - 29 mg/g creat  43      Chol 100 - 199 mg/dL 117   170  199    HDL >39 mg/dL 41   41  42    Calc LDL 0 - 99 mg/dL 50   103  130    Triglycerides 0 - 149 mg/dL 156   144  152    Creatinine 0.57 - 1.00 mg/dL 0.86  0.96  0.99  0.87  0.86       07/26/2021   10:17 AM 05/20/2021    9:26 AM 04/21/2021   10:11 AM 04/21/2021   10:04 AM 04/19/2021    3:37 PM 04/07/2021    3:14 PM 02/12/2021   10:58 AM  BP/Weight  Systolic BP 726 [phone] 453 646  Diastolic BP 49 72 68 64 69 73 83  Wt. (Lbs) 158.04 158.12  155 157.4    BMI 33.03 kg/m2 33.05 kg/m2  32.4 kg/m2 32.9 kg/m2        Latest Ref Rng & Units 04/21/2021   10:00 AM 03/01/2021   12:00 AM  Foot/eye exam completion dates  Eye Exam No Retinopathy  No Retinopathy      Foot Form Completion  Done      This result is from an external source.        Pain of right great toe X ray of toe,long  wait on rheumatology appt

## 2021-07-27 NOTE — Patient Instructions (Signed)
Goals  Try to go to bed earlier. Set alarm and get up at 830 am. Eat 3 meals per day B) 7/8 am, Lunch 12-2 and Dinner 5-7.  Try not to eat after 7 pm Drink only water Cut out snacks after supper and between meals.

## 2021-07-27 NOTE — Telephone Encounter (Signed)
Error

## 2021-07-27 NOTE — Assessment & Plan Note (Addendum)
Amy [place] is reminded of the importance of commitment to daily physical activity for 30 minutes or more, as able and the need to limit carbohydrate intake to 30 to 60 grams per meal to help with blood sugar control.   The need to take medication as prescribed, test blood sugar as directed, and to call between visits if there is a concern that blood sugar is uncontrolled is also discussed. Improved and controlled currently   Amy [place] is reminded of the importance of daily foot exam, annual eye examination, and good blood sugar, blood pressure and cholesterol control.     Latest Ref Rng & Units 07/20/2021   11:19 AM 05/16/2021    9:38 AM 04/20/2021   10:15 AM 12/16/2020   12:09 PM 08/09/2020   10:06 AM  Diabetic Labs  HbA1c 4.8 - 5.6 % 6.9   7.5  7.7    Micro/Creat Ratio 0 - 29 mg/g creat  43      Chol 100 - 199 mg/dL 117   170  199    HDL >39 mg/dL 41   41  42    Calc LDL 0 - 99 mg/dL 50   103  130    Triglycerides 0 - 149 mg/dL 156   144  152    Creatinine 0.57 - 1.00 mg/dL 0.86  0.96  0.99  0.87  0.86       07/26/2021   10:17 AM 05/20/2021    9:26 AM 04/21/2021   10:11 AM 04/21/2021   10:04 AM 04/19/2021    3:37 PM 04/07/2021    3:14 PM 02/12/2021   10:58 AM  BP/Weight  Systolic BP 448 185 631 497 026 378 588  Diastolic BP 49 72 68 64 69 73 83  Wt. (Lbs) 158.04 158.12  155 157.4    BMI 33.03 kg/m2 33.05 kg/m2  32.4 kg/m2 32.9 kg/m2        Latest Ref Rng & Units 04/21/2021   10:00 AM 03/01/2021   12:00 AM  Foot/eye exam completion dates  Eye Exam No Retinopathy  No Retinopathy      Foot Form Completion  Done      This result is from an external source.

## 2021-07-27 NOTE — Progress Notes (Signed)
Medical Nutrition Therapy  Appointment Start time:  1330  Appointment End time:  22  Primary concerns today: Type 2 Dm  Referral diagnosis: E11.8 Preferred learning style: no preference Learning readiness: Ready    NUTRITION ASSESSMENT  76 yr old female who has type 2 DM. She and her husband are retired. She has some stress in her life and will be working with a therapist soon. She notes she is use to staying up at night and sleeping during the day. She is retired and doesn't feel like she is going to be able to change her sleeping schedule much. She tends to only eat 2 meals a day and that is effecting her blood sugars.  Current eating habits are excessive to meet her needs contributing to weight and elevated blood sugar levels. A1C 6.9%, but is better from 7.5% when she was getting a lot of steroids.  She is willing to work on making better food choices. Wants to work with therapist to help her with her stress issues right now.  Anthropometrics  Wt Readings from Last 3 Encounters:  07/26/21 158 lb 0.6 oz (71.7 kg)  05/20/21 158 lb 1.9 oz (71.7 kg)  04/21/21 155 lb (70.3 kg)   Ht Readings from Last 3 Encounters:  07/26/21 4\' 10"  (1.473 m)  05/20/21 4\' 10"  (1.473 m)  04/21/21 4\' 10"  (1.473 m)   There is no height or weight on file to calculate BMI. @BMIFA @ Facility age limit for growth %iles is 20 years. Facility age limit for growth %iles is 20 years.    Clinical Medical Hx: see chart Medications: [place] Labs:  Lab Results  Component Value Date   HGBA1C 6.9 (H) 07/20/2021      Latest Ref Rng & Units 07/20/2021   11:19 AM 05/16/2021    9:38 AM 04/20/2021   10:15 AM  CMP  Glucose 70 - 99 mg/dL 135  146  184   BUN 8 - 27 mg/dL 16  14  22    Creatinine 0.57 - 1.00 mg/dL 0.86  0.96  0.99   Sodium 134 - 144 mmol/L 144  141  139   Potassium 3.5 - 5.2 mmol/L 4.5  4.3  5.0   Chloride 96 - 106 mmol/L 108  105  105   CO2 20 - 29 mmol/L 24  22  19    Calcium 8.7 - 10.3  mg/dL 10.1  9.6  9.8   Total Protein 6.0 - 8.5 g/dL 6.6   6.9   Total Bilirubin 0.0 - 1.2 mg/dL 0.4   0.4   Alkaline Phos 44 - 121 IU/L 73   73   AST 0 - 40 IU/L 18   24   ALT 0 - 32 IU/L 16   20   Lipid Panel     Component Value Date/Time   CHOL 117 07/20/2021 1119   TRIG 156 (H) 07/20/2021 1119   HDL 41 07/20/2021 1119   CHOLHDL 2.9 07/20/2021 1119   CHOLHDL 4.3 01/27/2020 1027   VLDL 26 12/29/2015 0912   LDLCALC 50 07/20/2021 1119   LDLCALC 115 (H) 01/27/2020 1027   LDLDIRECT 71 02/26/2020 1200   LABVLDL 26 07/20/2021 1119     Notable Signs/Symptoms: none  Lifestyle & Dietary Hx Lives with her husband. They both are retired.  Estimated daily fluid intake: 40 oz Supplements:  Sleep: not good sleep. Stays up and sleeps in Stress / self-care: family stress Current average weekly physical activity: ADL  24-Hr Dietary Recall Eats  2 meals per day, snacks at night  Estimated Energy Needs Calories: 1200 Carbohydrate: 135g Protein: 90g Fat: 33g   NUTRITION DIAGNOSIS  NB-1.1 Food and nutrition-related knowledge deficit As related to Diabetes Type 2.  As evidenced by A1C 6.9%.   NUTRITION INTERVENTION  Nutrition education (E-1) on the following topics:  Nutrition and Diabetes education provided on My Plate, CHO counting, meal planning, portion sizes, timing of meals, avoiding snacks between meals unless having a low blood sugar, target ranges for A1C and blood sugars, signs/symptoms and treatment of hyper/hypoglycemia, monitoring blood sugars, taking medications as prescribed, benefits of exercising 30 minutes per day and prevention of complications of DM. Lifestyle Medicine  - Whole Food, Plant Predominant Nutrition is highly recommended: Eat Plenty of vegetables, Mushrooms, fruits, Legumes, Whole Grains, Nuts, seeds in lieu of processed meats, processed snacks/pastries red meat, poultry, eggs.    -It is better to avoid simple carbohydrates including: Cakes, Sweet  Desserts, Ice Cream, Soda (diet and regular), Sweet Tea, Candies, Chips, Cookies, Store Bought Juices, Alcohol in Excess of  1-2 drinks a day, Lemonade,  Artificial Sweeteners, Doughnuts, Coffee Creamers, "Sugar-free" Products, etc, etc.  This is not a complete list.....  Exercise: If you are able: 30 -60 minutes a day ,4 days a week, or 150 minutes a week.  The longer the better.  Combine stretch, strength, and aerobic activities.  If you were told in the past that you have high risk for cardiovascular diseases, you may seek evaluation by your heart doctor prior to initiating moderate to intense exercise programs.   Handouts Provided Include  Plant based meal plan Timing of meals  Learning Style & Readiness for Change Teaching method utilized: Visual & Auditory  Demonstrated degree of understanding via: Teach Back  Barriers to learning/adherence to lifestyle change: stress  Goals Established by Pt Goals  Try to go to bed earlier. Set alarm and get up at 830 am. Eat 3 meals per day B) 7/8 am, Lunch 12-2 and Dinner 5-7.  Try not to eat after 7 pm Drink only water Cut out snacks after supper and between meals.   MONITORING & EVALUATION Dietary intake, weekly physical activity, and blood sugars  in 1 month.  Next Steps  Patient is to work on eating more on a regular schedule.Marland Kitchen

## 2021-07-27 NOTE — Assessment & Plan Note (Addendum)
X ray of toe,long  wait on rheumatology appt

## 2021-07-27 NOTE — Assessment & Plan Note (Signed)
Controlled, no change in medication DASH diet and commitment to daily physical activity for a minimum of 30 minutes discussed and encouraged, as a part of hypertension management. The importance of attaining a healthy weight is also discussed.     07/26/2021   10:17 AM 05/20/2021    9:26 AM 04/21/2021   10:11 AM 04/21/2021   10:04 AM 04/19/2021    3:37 PM 04/07/2021    3:14 PM 02/12/2021   10:58 AM  BP/Weight  Systolic BP 321 224 825 003 704 888 916  Diastolic BP 49 72 68 64 69 73 83  Wt. (Lbs) 158.04 158.12  155 157.4    BMI 33.03 kg/m2 33.05 kg/m2  32.4 kg/m2 32.9 kg/m2

## 2021-07-27 NOTE — Assessment & Plan Note (Signed)
Reports forgetfulness and dififculty with decision making, though MMSE is normal, will order brain scan to assess, has f/h of dementia and is very concerned

## 2021-07-30 ENCOUNTER — Other Ambulatory Visit: Payer: Self-pay | Admitting: Family Medicine

## 2021-07-30 ENCOUNTER — Other Ambulatory Visit: Payer: Self-pay | Admitting: Nurse Practitioner

## 2021-08-01 DIAGNOSIS — F325 Major depressive disorder, single episode, in full remission: Secondary | ICD-10-CM

## 2021-08-04 ENCOUNTER — Encounter (HOSPITAL_COMMUNITY): Payer: Self-pay | Admitting: Psychiatry

## 2021-08-04 ENCOUNTER — Ambulatory Visit (HOSPITAL_COMMUNITY): Payer: Self-pay | Admitting: Psychiatry

## 2021-08-04 DIAGNOSIS — F320 Major depressive disorder, single episode, mild: Secondary | ICD-10-CM

## 2021-08-04 NOTE — Progress Notes (Signed)
IN-PERSON   Comprehensive Clinical Assessment (CCA) Note  08/04/2021 Amy Houston [id number]  Chief Complaint:  Chief Complaint  Patient presents with   Stress   Visit Diagnosis: MDD, single episode, mild    CCA Biopsychosocial Intake/Chief Complaint:  "I am stressed to the max. due to this chapter in my life and how I want to spend the rest of my life, I want to spend it stress free, I am more in touch with my mortality, end of life, I am concerned about making preparations, husband is stressful, I think he has ADD, he talks excessively, doesn't listen, repeats same information several times, constantly tells me things to do"  Current Symptoms/Problems: anger outbursts, irritability, muscle tension, poor concentration, procrastination   Patient Reported Schizophrenia/Schizoaffective Diagnosis in Past: No   Strengths: positivity, determination, perseverance  Preferences: Individual therapy  Abilities: administrative skills   Type of Services Patient Feels are Needed: Individual therapy - how to cope with stress and dealing with husband   Initial Clinical Notes/Concerns: Pt is referred for services by PCP Dr. Moshe Cipro due to pt experiencing symptoms of depression and transition issues. She denies any psychiatric hospitalizations. Pt and husband participated in couples' counseling several years ago. Pt currently is taking effexor- this is being managed by PCP.   Mental Health Symptoms Depression:   Irritability; Fatigue; Difficulty Concentrating   Duration of Depressive symptoms: No data recorded  Mania:   Irritability   Anxiety:    Irritability; Fatigue   Psychosis:   None   Duration of Psychotic symptoms: No data recorded  Trauma:   None   Obsessions:   None   Compulsions:   None   Inattention:   None   Hyperactivity/Impulsivity:   None   Oppositional/Defiant Behaviors:  No data recorded  Emotional Irregularity:   None   Other  Mood/Personality Symptoms:  No data recorded   Mental Status Exam Appearance and self-care  Stature:   Average   Weight:   Overweight   Clothing:   Neat/clean   Grooming:   Well-groomed   Cosmetic use:   Age appropriate   Posture/gait:   Stooped   Motor activity:   Not Remarkable   Sensorium  Attention:   Normal   Concentration:   Normal   Orientation:   X5   Recall/memory:   Normal   Affect and Mood  Affect:   Appropriate   Mood:   Depressed   Relating  Eye contact:   Normal   Facial expression:   Responsive   Attitude toward examiner:   Cooperative   Thought and Language  Speech flow:  Normal   Thought content:   Appropriate to Mood and Circumstances   Preoccupation:   None   Hallucinations:   None   Organization:  No data recorded  [employer] of Knowledge:   Good   Intelligence:   Average   Abstraction:   Normal   Judgement:   Good   Reality Testing:   Realistic   Insight:   Good   Decision Making:   Normal   Social Functioning  Social Maturity:   Responsible   Social Judgement:   Normal   Stress  Stressors:   Family conflict; Financial; Transitions; Relationship   Coping Ability:   Resilient   Skill Deficits:  No data recorded  Supports:   Family; Friends/Service system     Religion: Religion/Spirituality Are You A Religious Person?: Yes What is Your Religious Affiliation?: Baptist  Leisure/Recreation:  Leisure / Recreation Do You Have Hobbies?: Yes Leisure and Hobbies: go to movies, go out with other couples, travel  Exercise/Diet: Exercise/Diet Do You Exercise?: No Have You Gained or Lost A Significant Amount of Weight in the Past Six Months?: No Do You Follow a Special Diet?: No Do You Have Any Trouble Sleeping?: No   CCA Employment/Education Employment/Work Situation: 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, English as a foreign language) What is the [place] Time Patient has Held a Job?: 19 years Where was the Patient Employed at that Time?: [place] Has Patient ever Been in the [employer]?: No  Education: Education Did Teacher, adult education From [employer]?: Yes Did Physicist, medical?: Yes (Took classes for Immunologist) Did You Have Any Chief Technology Officer In School?: member of [employer]- secretarial club Did You Have An Individualized Education Program (IIEP): No Did You Have Any Difficulty At [employer]?: No Patient's Education Has Been Impacted by Current Illness: No   CCA Family/Childhood History Family and Relationship History: Family history Marital status: Married (Pt and husband reside in [place].) Number of Years Married: 57 What types of issues is patient dealing with in the relationship?: communication issues, transitiions Does patient have children?: Yes How many children?: 2 (daughters, age 33 and 37) How is patient's relationship with their children?: great  Childhood History:  Childhood History By whom was/is the patient raised?: Both parents Additional childhood history information: Pt was born in [place] and reared in [place] area, mostly [place] Description of patient's relationship with caregiver when they were a child: great with mother, strained with dad as he did not treat my mother well Patient's description of current relationship with people who raised him/her: deceased How were you disciplined when you got in trouble as a child/adolescent?: I seldom got in trouble Does patient have siblings?: Yes Number of Siblings: 5 Description of patient's current relationship with siblings: 4 are deceased , very close with remaining sister Did patient suffer any verbal/emotional/physical/sexual abuse as a child?: No Did patient suffer from severe childhood neglect?: No Has patient ever been sexually abused/assaulted/raped as an adolescent or adult?: No Was the patient ever a  victim of a crime or a disaster?: No Witnessed domestic violence?: No Has patient been affected by domestic violence as an adult?: No  Child/Adolescent Assessment: N/A     CCA Substance Use Alcohol/Drug Use: Alcohol / Drug Use Pain Medications: See patient record Prescriptions: See patient record Over the Counter: see patient record History of alcohol / drug use?: No history of alcohol / drug abuse N/A  ASAM's:  Six Dimensions of Multidimensional Assessment  Dimension 1:  Acute Intoxication and/or Withdrawal Potential:   Dimension 1:  Description of individual's past and current experiences of substance use and withdrawal: none  Dimension 2:  Biomedical Conditions and Complications:   Dimension 2:  Description of patient's biomedical conditions and  complications: none  Dimension 3:  Emotional, Behavioral, or Cognitive Conditions and Complications:  Dimension 3:  Description of emotional, behavioral, or cognitive conditions and complications: none  Dimension 4:  Readiness to Change:  Dimension 4:  Description of Readiness to Change criteria: none  Dimension 5:  Relapse, Continued use, or Continued Problem Potential:  Dimension 5:  Relapse, continued use, or continued problem potential critiera description: none  Dimension 6:  Recovery/Living Environment:  Dimension 6:  Recovery/Iiving environment criteria description: none  ASAM Severity Score: ASAM's Severity Rating Score: 0  ASAM Recommended Level of Treatment:  Substance use Disorder (SUD) None  Recommendations for Services/Supports/Treatments: Recommendations for Services/Supports/Treatments Recommendations For Services/Supports/Treatments: Individual Therapy/patient attends assessment appointment today.  Confidentiality and limits are discussed.  Nutritional assessment, pain assessment PHQ 2 and 9 with C-SS RIS, GAD-7 administered.  Individual therapy is recommended 1 time every 1 to 4 weeks to improve coping skills to  manage stress.  Patient agrees to return for an appointment in 1 to 2 weeks.  She agrees to call this practice, call 911, or have someone take her to the ED should symptoms worsen.  She continues to see the PCP Dr. Tula Nakayama for medication management.  DSM5 Diagnoses: Patient Active Problem List   Diagnosis Date Noted   MCI (mild cognitive impairment) 07/26/2021   FH: dementia 07/26/2021   Annual visit for general adult medical examination without abnormal findings 05/22/2021   Anxiety 05/22/2021   Positive PPD, treated 05/20/2021   Pain of right great toe 05/09/2021   Acute gout 05/06/2021   Gout 04/21/2021   Depression, major, single episode, severe ([place]) 04/21/2021   Blood in the stool 12/16/2020   Long-term current use of bisphosphonate 08/12/2020   Dyspnea on exertion    Angina pectoris ([place]) 03/15/2020   Class 1 obesity with serious comorbidity and body mass index (BMI) of 33.0 to 33.9 in adult 09/04/2019   Family history of thyroid cancer 09/04/2019   Hyperlipidemia 09/04/2019   Vitamin D deficiency disease 07/24/2019   Essential hypertension, benign 01/08/2019   Type 2 diabetes mellitus with vascular disease ([place]) 01/08/2019   Diverticulitis of colon 03/08/2015   Acquired trigger finger 05/06/2013   Abnormal genetic test 04/09/2013   Osteopenia 12/11/2012   Thyroid nodule 06/21/2012   Gastroesophageal reflux disease without esophagitis 06/03/2012   Adenomatous polyps 06/03/2012   Breast cancer, right breast, IDC, Multifocal 03/12/2012   Popliteal cyst 08/18/2010   Hx Left breast cancer, UOQ, IDC, receptor negative 11/08/1993    Patient Centered Plan: Patient is on the following Treatment Plan(s): Will be developed next session   Referrals to Alternative Service(s): Referred to Alternative Service(s):   Place:   Date:   Time:    Referred to Alternative Service(s):   Place:   Date:   Time:    Referred to Alternative Service(s):   Place:   Date:   Time:     Referred to Alternative Service(s):   Place:   Date:   Time:      Collaboration of Care: Primary Care Provider AEB patient sees PCP Dr. Tula Nakayama for medication management  Patient/Guardian was advised Release of Information must be obtained prior to any record release in order to collaborate their care with an outside provider. Patient/Guardian was advised if they have not already done so to contact the registration department to sign all necessary forms in order for [place] to release information regarding their care.   Consent: Patient/Guardian gives verbal consent for treatment and assignment of benefits for services provided during this visit. Patient/Guardian expressed understanding and agreed to proceed.   Irja Wheless E Raygen Dahm, LCSW

## 2021-08-10 ENCOUNTER — Ambulatory Visit (HOSPITAL_COMMUNITY)
Admission: RE | Admit: 2021-08-10 | Discharge: 2021-08-10 | Disposition: A | Discharge status: Home / Self Care | Payer: PPO | Source: Ambulatory Visit | Attending: Family Medicine | Admitting: Family Medicine

## 2021-08-10 ENCOUNTER — Other Ambulatory Visit: Payer: Self-pay

## 2021-08-10 DIAGNOSIS — M810 Age-related osteoporosis without current pathological fracture: Secondary | ICD-10-CM | POA: Diagnosis present

## 2021-08-10 DIAGNOSIS — R6889 Other general symptoms and signs: Secondary | ICD-10-CM | POA: Diagnosis present

## 2021-08-10 DIAGNOSIS — C50111 Malignant neoplasm of central portion of right female breast: Secondary | ICD-10-CM | POA: Diagnosis present

## 2021-08-10 DIAGNOSIS — Z818 Family history of other mental and behavioral disorders: Secondary | ICD-10-CM | POA: Diagnosis present

## 2021-08-10 DIAGNOSIS — G3184 Mild cognitive impairment, so stated: Secondary | ICD-10-CM | POA: Diagnosis present

## 2021-08-10 DIAGNOSIS — Z170 Estrogen receptor positive status [ER+]: Secondary | ICD-10-CM | POA: Diagnosis present

## 2021-08-11 ENCOUNTER — Ambulatory Visit (HOSPITAL_COMMUNITY)
Admission: RE | Admit: 2021-08-11 | Discharge: 2021-08-11 | Disposition: A | Discharge status: Home / Self Care | Payer: PPO | Source: Ambulatory Visit | Attending: Hematology | Admitting: Hematology

## 2021-08-11 ENCOUNTER — Ambulatory Visit (HOSPITAL_COMMUNITY): Payer: PPO

## 2021-08-11 ENCOUNTER — Inpatient Hospital Stay: Payer: PPO | Attending: Hematology

## 2021-08-11 DIAGNOSIS — C50911 Malignant neoplasm of unspecified site of right female breast: Secondary | ICD-10-CM | POA: Insufficient documentation

## 2021-08-11 DIAGNOSIS — Z7982 Long term (current) use of aspirin: Secondary | ICD-10-CM | POA: Insufficient documentation

## 2021-08-11 DIAGNOSIS — Z9013 Acquired absence of bilateral breasts and nipples: Secondary | ICD-10-CM | POA: Diagnosis not present

## 2021-08-11 DIAGNOSIS — Z170 Estrogen receptor positive status [ER+]: Secondary | ICD-10-CM

## 2021-08-11 DIAGNOSIS — M810 Age-related osteoporosis without current pathological fracture: Secondary | ICD-10-CM | POA: Diagnosis not present

## 2021-08-11 DIAGNOSIS — Z79899 Other long term (current) drug therapy: Secondary | ICD-10-CM | POA: Diagnosis not present

## 2021-08-11 DIAGNOSIS — Z79811 Long term (current) use of aromatase inhibitors: Secondary | ICD-10-CM | POA: Insufficient documentation

## 2021-08-11 DIAGNOSIS — C50111 Malignant neoplasm of central portion of right female breast: Secondary | ICD-10-CM | POA: Diagnosis not present

## 2021-08-11 LAB — CBC WITH DIFFERENTIAL/PLATELET
Abs Immature Granulocytes: 0.03 10*3/uL (ref 0.00–0.07)
Basophils Absolute: 0 10*3/uL (ref 0.0–0.1)
Basophils Relative: 0 %
Eosinophils Absolute: 0.1 10*3/uL (ref 0.0–0.5)
Eosinophils Relative: 1 %
HCT: 38.4 % (ref 36.0–46.0)
Hemoglobin: 12.8 g/dL (ref 12.0–15.0)
Immature Granulocytes: 0 %
Lymphocytes Relative: 34 %
Lymphs Abs: 2.4 10*3/uL (ref 0.7–4.0)
MCH: 31.8 pg (ref 26.0–34.0)
MCHC: 33.3 g/dL (ref 30.0–36.0)
MCV: 95.3 fL (ref 80.0–100.0)
Monocytes Absolute: 0.5 10*3/uL (ref 0.1–1.0)
Monocytes Relative: 7 %
Neutro Abs: 4 10*3/uL (ref 1.7–7.7)
Neutrophils Relative %: 58 %
Platelets: 236 10*3/uL (ref 150–400)
RBC: 4.03 MIL/uL (ref 3.87–5.11)
RDW: 12.4 % (ref 11.5–15.5)
WBC: 7 10*3/uL (ref 4.0–10.5)
nRBC: 0 % (ref 0.0–0.2)

## 2021-08-11 LAB — COMPREHENSIVE METABOLIC PANEL
ALT: 20 U/L (ref 0–44)
AST: 22 U/L (ref 15–41)
Albumin: 4.1 g/dL (ref 3.5–5.0)
Alkaline Phosphatase: 61 U/L (ref 38–126)
Anion gap: 7 (ref 5–15)
BUN: 21 mg/dL (ref 8–23)
CO2: 24 mmol/L (ref 22–32)
Calcium: 9.5 mg/dL (ref 8.9–10.3)
Chloride: 110 mmol/L (ref 98–111)
Creatinine, Ser: 0.93 mg/dL (ref 0.44–1.00)
GFR, Estimated: >60 mL/min (ref >60)
Glucose, Bld: 141 mg/dL — ABNORMAL HIGH (ref 70–99)
Potassium: 4 mmol/L (ref 3.5–5.1)
Sodium: 141 mmol/L (ref 135–145)
Total Bilirubin: 0.6 mg/dL (ref 0.3–1.2)
Total Protein: 7.3 g/dL (ref 6.5–8.1)

## 2021-08-11 MED ORDER — GADOBUTROL 1 MMOL/ML IV SOLN
7.0000 mL | Freq: Once | INTRAVENOUS | Status: AC | PRN
  Administered 2021-08-11: 7 mL via INTRAVENOUS

## 2021-08-18 ENCOUNTER — Inpatient Hospital Stay (HOSPITAL_BASED_OUTPATIENT_CLINIC_OR_DEPARTMENT_OTHER): Payer: PPO | Admitting: Hematology

## 2021-08-18 VITALS — BP 159/60 | HR 80 | Temp 98.4°F | Resp 16 | Ht <= 58 in | Wt 155.8 lb

## 2021-08-18 DIAGNOSIS — Z170 Estrogen receptor positive status [ER+]: Secondary | ICD-10-CM | POA: Diagnosis not present

## 2021-08-18 DIAGNOSIS — C50911 Malignant neoplasm of unspecified site of right female breast: Secondary | ICD-10-CM | POA: Diagnosis not present

## 2021-08-18 DIAGNOSIS — C50111 Malignant neoplasm of central portion of right female breast: Secondary | ICD-10-CM

## 2021-08-18 NOTE — Patient Instructions (Addendum)
[place] at [facility] Discharge Instructions   You were seen and examined today by Dr. Delton Coombes.  He reviewed the results of your lab work which are normal/stable.   He reviewed the results of your mri mammogram which is normal.  Continue Femara until your visit with [place] next year.   Return as scheduled.   Thank you for choosing [place] at [facility] to provide your oncology and hematology care.  To afford each patient quality time with our provider, please arrive at least 15 minutes before your scheduled appointment time.   If you have a lab appointment with the [facility] please come in thru the Main Entrance and check in at the main information desk.  You need to re-schedule your appointment should you arrive 10 or more minutes late.  We strive to give you quality time with our providers, and arriving late affects you and other patients whose appointments are after yours.  Also, if you no show three or more times for appointments you may be dismissed from the clinic at the providers discretion.     Again, thank you for choosing [facility].  Our hope is that these requests will decrease the amount of time that you wait before being seen by our physicians.       _____________________________________________________________  Should you have questions after your visit to [facility], please contact our office at [phone] and follow the prompts.  Our office hours are 8:00 a.m. and 4:30 p.m. Monday - Friday.  Please note that voicemails left after 4:00 p.m. may not be returned until the following business day.  We are closed weekends and major holidays.  You do have access to a nurse 24-7, just call the main number to the clinic [phone] and do not press any options, hold on the line and a nurse will answer the phone.    For prescription refill requests, have your pharmacy contact our office and allow  72 hours.    Due to Covid, you will need to wear a mask upon entering the hospital. If you do not have a mask, a mask will be given to you at the Main Entrance upon arrival. For doctor visits, patients may have 1 support person age 39 or older with them. For treatment visits, patients can not have anyone with them due to social distancing guidelines and our immunocompromised population.

## 2021-08-18 NOTE — Progress Notes (Signed)
[place], [place] [address]   CLINIC:  Medical Oncology/Hematology  PCP:  Fayrene Helper, MD [address] [place] [id number] [phone]   REASON FOR VISIT:  Follow-up for right breast cancer.  PRIOR THERAPY: Bilateral mastectomy  NGS Results: Not applicable  CURRENT THERAPY: Femara  BRIEF ONCOLOGIC HISTORY:  Oncology History  Breast cancer, right breast, IDC, Multifocal  03/19/2012 Initial Diagnosis   Breast cancer, right breast, IDC, Multifocal   05/08/2012 Surgery   Bilateral simple mastectomies by Dr. Margot Chimes   06/03/2012 -  Chemotherapy   Aromatase inhibitor.  PALB2 and CHEK2+ and therefore would benefit from lifelong AI therapy.   01/30/2013 Surgery   Laparotomy, minimal lysis of adhesions, abdominal hysterectomy, bilateral salpingo-oophorectomy.     CANCER STAGING: Cancer Staging  Breast cancer, right breast, IDC, Multifocal Staging form: Breast, AJCC 7th Edition - Clinical: No stage assigned - Unsigned - Pathologic: Stage IA (T1c, N0, cM0) - Signed by Haywood Lasso, MD on 05/13/2012    INTERVAL HISTORY:  Amy Houston 76 y.o. female returns for follow-up of breast cancer.  Does not have new onset pains.  Energy levels are 100% and appetite is 100%.     REVIEW OF SYSTEMS:  Review of Systems  Psychiatric/Behavioral:  The patient is nervous/anxious.   All other systems reviewed and are negative.    PAST MEDICAL/SURGICAL HISTORY:  Past Medical History:  Diagnosis Date   Abnormal genetic test 04/09/2013   PALB2 and CHEK2 positivity    Arthritis    "of the spine" (05/08/2012)   Breast cancer ([facility]) 1995   "left" (05/08/2012)   Breast cancer, right breast, IDC, Multifocal 03/12/2012   Multifocal, 10 and 8 left breast    Diabetes mellitus 1999   "dx; never on meds" (05/08/2012)   Diabetes mellitus without complication ([facility]) 06/06/3327   Essential hypertension, benign 01/08/2019   Exertional shortness  of breath    Family history of thyroid cancer    GERD (gastroesophageal reflux disease)    tums   H/O hiatal hernia    Hemorrhoid    History of blood transfusion    post child birth   IBS (irritable bowel syndrome)    Osteopenia 12/11/2012   On Ca++ and Vit D. Bone density in April 2014.  Next bone density due in April 2016.   PONV (postoperative nausea and vomiting)    PPD positive, treated 1999   Past Surgical History:  Procedure Laterality Date   ABDOMINAL HYSTERECTOMY Bilateral 01/30/2013   Procedure: HYSTERECTOMY ABDOMINAL;  Surgeon: Melina Schools, MD;  Location: [facility];  Service: Gynecology;  Laterality: Bilateral;   BREAST BIOPSY Left 1995   BREAST BIOPSY Bilateral 03/2012   "one on the left; 2 on the right" (05/08/2012)   [facility]   COLONOSCOPY  12/06/93   Dr. Hayes:single diminutive polyp of the descending colon/extrernal hemorrhoids, benign path   COLONOSCOPY  11/30/98   Dr Hayes:small rectal polyp/internal hemorrhoids, benign path   COLONOSCOPY  11/26/2006   Dr. Hayes:sigmoid polyp/small interal hemorrhoids, adenomatous   COLONOSCOPY N/A 03/30/2016   Dr. Oneida Alar: normal TI, 4 mm rectal polyp, multiple small and large-mouthed diverticula in rectosigmoid and sigmoid colon, non-bleeding internal hemorrhoids, tubular adenoma on path. surveillance in 5 yeras   COLONOSCOPY WITH ESOPHAGOGASTRODUODENOSCOPY (EGD) N/A 07/01/2012   Dr. Oneida Alar: 2 small adenomas, sigmoid colon diverticula, surveillance in 2019   [place]  UTERUS  1968   ESOPHAGEAL DILATION  07/01/2012   Dr. Oneida Alar: proximal esophageal web, Schatzki's ring at GE junction, s/p dilaiton. Mild non-erosive gastritis. Repeat EGD July 23, 2012.    ESOPHAGOGASTRODUODENOSCOPY  1994   Dr. Cristina Gong: small hiatal hernia and Schatzki's ring widely patent, CLO test negative   ESOPHAGOGASTRODUODENOSCOPY (EGD) WITH ESOPHAGEAL DILATION N/A 07/23/2012   Dr. Fields:proximal  esophageal web v. radiation induced stricture/schatzki ring/small HH, s/p dilation   LEFT HEART CATH AND CORONARY ANGIOGRAPHY N/A 03/16/2020   Procedure: LEFT HEART CATH AND CORONARY ANGIOGRAPHY;  Surgeon: Adrian Prows, MD;  Location: [facility];  Service: Cardiovascular;  Laterality: N/A;   MASTECTOMY COMPLETE / SIMPLE Left 05/08/2012   MASTECTOMY COMPLETE / SIMPLE W/ SENTINEL NODE BIOPSY Right 05/08/2012   SALPINGOOPHORECTOMY Bilateral 01/30/2013   Procedure: SALPINGO OOPHORECTOMY;  Surgeon: Melina Schools, MD;  Location: [facility];  Service: Gynecology;  Laterality: Bilateral;   SIMPLE MASTECTOMY WITH AXILLARY SENTINEL NODE BIOPSY Right 05/08/2012   Procedure: RIGHT TOTAL MASTECTOMY WITH AXILLARY SENTINEL NODE BIOPSY;  Surgeon: Haywood Lasso, MD;  Location: [facility];  Service: General;  Laterality: Right;   SIMPLE MASTECTOMY WITH AXILLARY SENTINEL NODE BIOPSY Left 05/08/2012   Procedure: LEFT TOTAL MASTECTOMY;  Surgeon: Haywood Lasso, MD;  Location: [facility];  Service: General;  Laterality: Left;   TONSILLECTOMY AND ADENOIDECTOMY  ~ 2008   TUBAL LIGATION  1976   UVULOPALATOPHARYNGOPLASTY, TONSILLECTOMY AND SEPTOPLASTY  ~ 2008     SOCIAL HISTORY:  Social History   Socioeconomic History   Marital status: Married    Spouse name: Mortimer Fries   Number of children: 2   Years of education: 12th grade   Highest education level: Not on file  Occupational History   Occupation: retired    Fish farm manager: [employer]  Tobacco Use   Smoking status: Never   Smokeless tobacco: Never  Vaping Use   Vaping Use: Never used  Substance and Sexual Activity   Alcohol use: Not Currently    Comment: rare    Drug use: No   Sexual activity: Not Currently  Other Topics Concern   Not on file  Social History Narrative   Married 7 yrs.Lives with her husband.Retired.   Social Determinants of Health   Financial Resource Strain: Low Risk  (01/06/2021)   Overall Financial Resource Strain (CARDIA)     Difficulty of Paying Living Expenses: Not hard at all  Food Insecurity: No Food Insecurity (01/06/2021)   Hunger Vital Sign    Worried About Running Out of Food in the Last Year: Never true    Ran Out of Food in the Last Year: Never true  Transportation Needs: No Transportation Needs (01/06/2021)   PRAPARE - Hydrologist (Medical): No    Lack of Transportation (Non-Medical): No  Physical Activity: Inactive (01/06/2021)   Exercise Vital Sign    Days of Exercise per Week: 0 days    Minutes of Exercise per Session: 0 min  Stress: No Stress Concern Present (01/06/2021)   [place]    Feeling of Stress : Only a little  Social Connections: Socially Integrated (01/06/2021)   Social Connection and Isolation Panel [NHANES]    Frequency of Communication with Friends and Family: More than three times a week    Frequency of Social Gatherings with Friends and Family: Three times a week    Attends Religious Services: More than 4 times per year  Active Member of Clubs or Organizations: Yes    Attends Archivist Meetings: More than 4 times per year    Marital Status: Married  Human resources officer Violence: Not At Risk (01/06/2021)   Humiliation, Afraid, Rape, and Kick questionnaire    Fear of Current or Ex-Partner: No    Emotionally Abused: No    Physically Abused: No    Sexually Abused: No    FAMILY HISTORY:  Family History  Problem Relation Age of Onset   Lung cancer Mother 81       smoker   Depression Father    Leukemia Father 20       Hairy Cell at 16, CLL at 52   Lymphoma Father 74       Hodgkins lymphoma   Lung cancer Sister 68   Thyroid cancer Brother 49       Medullary   Lymphoma Brother 38       Follicular lymphoma   Breast cancer Maternal Aunt        mother's paternal half sister   Colon cancer Maternal Aunt        mother's maternal half sister   Lung cancer Maternal Uncle         mother's paternal half brother   Cancer Paternal Uncle        oral cancer dx in his 52s   Lung cancer Maternal Grandmother        smoker   Lung cancer Cousin        paternal cousin   Breast cancer Cousin        paternal cousin died in her 69s   Cancer Daughter        thyroid   Other Daughter        leiomyoma of esophagus   Thyroid cancer Daughter    Cancer Daughter        breast   Breast cancer Daughter     CURRENT MEDICATIONS:  Outpatient Encounter Medications as of 08/18/2021  Medication Sig   acetaminophen (TYLENOL) 500 MG tablet Take 1,000 mg by mouth daily as needed for moderate pain or headache.    albuterol (VENTOLIN HFA) 108 (90 Base) MCG/ACT inhaler Inhale 2 puffs into the lungs every 6 (six) hours as needed for wheezing or shortness of breath.   aspirin EC 81 MG tablet Take 81 mg by mouth daily. Swallow whole.   blood glucose meter kit and supplies Dispense based on patient and insurance preference. Once daily testing DX E11.9   calcium-vitamin D (OSCAL WITH D) 500-200 MG-UNIT tablet Take 1 tablet by mouth daily.   Cholecalciferol (VITAMIN D) 2000 units CAPS Take 6,000 Units by mouth at bedtime.    empagliflozin (JARDIANCE) 25 MG TABS tablet Take 1 tablet (25 mg total) by mouth daily before breakfast.   fluticasone (FLONASE) 50 MCG/ACT nasal spray Place 1 spray into both nostrils daily as needed for allergies or rhinitis.   glucose blood test strip Use as instructed daily dx e11.9   Lancets 30G MISC Once daily testing dx e11.9   letrozole (FEMARA) 2.5 MG tablet TAKE (1) TABLET BY MOUTH ONCE DAILY.   lisinopril (ZESTRIL) 40 MG tablet Take 1 tablet (40 mg total) by mouth daily.   metoprolol succinate (TOPROL-XL) 100 MG 24 hr tablet TAKE 1 TABLET BY MOUTH ONCE A DAY.   Misc Natural Products (TART CHERRY ADVANCED PO) Take by mouth. 1 daily   Multiple Vitamin (MULTIVITAMIN WITH MINERALS) TABS Take 1 tablet by mouth at  bedtime.    omeprazole (PRILOSEC) 20 MG capsule TAKE ONE  CAPSULE BY MOUTH ONCE DAILY.   rosuvastatin (CRESTOR) 10 MG tablet Take 1 tablet (10 mg total) by mouth daily.   venlafaxine XR (EFFEXOR-XR) 75 MG 24 hr capsule TAKE ONE CAPSULE BY MOUTH TWO TIMES DAILY   nitroGLYCERIN (NITROSTAT) 0.4 MG SL tablet Place 0.4 mg under the tongue every 5 (five) minutes as needed for chest pain. (Patient not taking: Reported on 08/18/2021)   ondansetron (ZOFRAN ODT) 4 MG disintegrating tablet Take 1 tablet (4 mg total) by mouth every 8 (eight) hours as needed for nausea or vomiting. (Patient not taking: Reported on 08/18/2021)   No facility-administered encounter medications on file as of 08/18/2021.    ALLERGIES:  Allergies  Allergen Reactions   Penicillins Rash    Has patient had a PCN reaction causing immediate rash, facial/tongue/throat swelling, SOB or lightheadedness with hypotension: Yes Has patient had a PCN reaction causing severe rash involving mucus membranes or skin necrosis: No Has patient had a PCN reaction that required hospitalization No Has patient had a PCN reaction occurring within the last 10 years: No If all of the above answers are "NO", then may proceed with Cephalosporin use.    Colchicine Other (See Comments)    Reports vomiting an diarrheah   Indomethacin Other (See Comments)    Reports vomiting and diarrheah     PHYSICAL EXAM:  ECOG Performance status: 1  Vitals:   08/18/21 1151  BP: (!) 159/60  Pulse: 80  Resp: 16  Temp: 98.4 F (36.9 C)  SpO2: 97%   Filed Weights   08/18/21 1151  Weight: 155 lb 12.8 oz (70.7 kg)   Physical Exam Vitals reviewed.  Constitutional:      Appearance: Normal appearance.  Cardiovascular:     Rate and Rhythm: Normal rate and regular rhythm.     Heart sounds: Normal heart sounds.  Pulmonary:     Effort: Pulmonary effort is normal.     Breath sounds: Normal breath sounds.  Abdominal:     Palpations: Abdomen is soft. There is no mass.  Neurological:     Mental Status: She is alert.   Psychiatric:        Mood and Affect: Mood normal.        Behavior: Behavior normal.      LABORATORY DATA:  I have reviewed the labs as listed.  CBC    Component Value Date/Time   WBC 7.0 08/11/2021 1041   RBC 4.03 08/11/2021 1041   HGB 12.8 08/11/2021 1041   HGB 12.1 03/12/2020 1046   HCT 38.4 08/11/2021 1041   HCT 36.8 03/12/2020 1046   PLT 236 08/11/2021 1041   PLT 272 03/12/2020 1046   MCV 95.3 08/11/2021 1041   MCV 93 03/12/2020 1046   MCH 31.8 08/11/2021 1041   MCHC 33.3 08/11/2021 1041   RDW 12.4 08/11/2021 1041   RDW 12.2 03/12/2020 1046   LYMPHSABS 2.4 08/11/2021 1041   MONOABS 0.5 08/11/2021 1041   EOSABS 0.1 08/11/2021 1041   BASOSABS 0.0 08/11/2021 1041      Latest Ref Rng & Units 08/11/2021   10:41 AM 07/20/2021   11:19 AM 05/16/2021    9:38 AM  CMP  Glucose 70 - 99 mg/dL 141  135  146   BUN 8 - 23 mg/dL 21  16  14    Creatinine 0.44 - 1.00 mg/dL 0.93  0.86  0.96   Sodium 135 - 145 mmol/L 141  144  141   Potassium 3.5 - 5.1 mmol/L 4.0  4.5  4.3   Chloride 98 - 111 mmol/L 110  108  105   CO2 22 - 32 mmol/L 24  24  22    Calcium 8.9 - 10.3 mg/dL 9.5  10.1  9.6   Total Protein 6.5 - 8.1 g/dL 7.3  6.6    Total Bilirubin 0.3 - 1.2 mg/dL 0.6  0.4    Alkaline Phos 38 - 126 U/L 61  73    AST 15 - 41 U/L 22  18    ALT 0 - 44 U/L 20  16      DIAGNOSTIC IMAGING:  I have independently reviewed the scans and discussed with the patient.  ASSESSMENT:  1.  Stage I (T1CN0) right breast IDC: - Pathology: 1.2 cm, grade 2, ER/PR positive, HER2 negative, positive LVI, Ki-67 60%, 0/4 lymph nodes involved, Oncotype DX 14. - Status post bilateral mastectomy on 05/08/2012, bilateral salpingo-oophorectomy on 01/30/2013 - PALB2 and CHEK2 mutation positive - Aromasin started on 06/03/2012, switched to Femara     PLAN:  1.  Stage I right breast cancer: - Physical examination today shows bilateral mastectomy sites within normal limits with no suspicion for recurrence. -  Bilateral breast MRI with and without contrast with no evidence of malignancy on 08/11/2021. - Reviewed labs which showed normal LFTs and CBC. - RTC 1 year with mammogram and labs.  2.  Osteoporosis: - Received Prolia until 01/15/2020 and decided not to continue it after unrelated jaw pain. - Last DEXA scan on 08/09/2020 shows T score -2.1. - Continue calcium and vitamin D supplements.      Orders placed this encounter:  Orders Placed This Encounter  Procedures   MR BREAST BILATERAL [facility] [place] [facility]      Derek Jack, [place] [phone]

## 2021-08-25 ENCOUNTER — Ambulatory Visit (INDEPENDENT_AMBULATORY_CARE_PROVIDER_SITE_OTHER): Payer: PPO | Admitting: Psychiatry

## 2021-08-25 ENCOUNTER — Encounter (HOSPITAL_COMMUNITY): Payer: Self-pay

## 2021-08-25 DIAGNOSIS — F320 Major depressive disorder, single episode, mild: Secondary | ICD-10-CM

## 2021-08-25 NOTE — Plan of Care (Signed)
  Problem: Depression CCP Problem anger outbursts, irritability, muscle tension, feeling down and hopeless at times  Goal: I want to learn how to cope with this stress Outcome: Initial Goal: LTG: Reduce frequency, intensity, and duration of depression symptoms AEB pt reducing episodes of anger,irritability, depressed mood to 1 x per week consistently for 60 days per pt's report Outcome: Initial Goal: STG: Amy Houston WILL IDENTIFY 3 COGNITIVE PATTERNS AND BELIEFS THAT SUPPORT DEPRESSION Outcome: Initial

## 2021-08-25 NOTE — Progress Notes (Signed)
IN-PERSON  THERAPIST PROGRESS NOTE  Session Time: Thursday 08/25/2021 9:05 AM - 10:00 AM   Participation Level: Active  Behavioral Response: Well GroomedAlertDepressed  Type of Therapy: Individual Therapy  Treatment Goals addressed: Reduce frequency, intensity, and duration of depression symptoms AEB pt reducing episodes of anger,irritability, depressed mood to 1 x per week consistently for 60 days per pt's report / Joury WILL IDENTIFY 3 COGNITIVE PATTERNS AND BELIEFS THAT SUPPORT DEPRESSION   ProgressTowards Goals: Initial  Interventions: CBT and Supportive  Summary: Amy Houston is a 76 y.o. female who is referred for services by PCP Dr. Moshe Cipro due to pt experiencing symptoms of depression and transition issues. She denies any psychiatric hospitalizations. Pt and husband participated in couples' counseling several years ago. Pt currently is taking effexor- this is being managed by PCP.  Patient states being stressed to the max due to this chapter in her life.  She says she wants to spend the rest of her life stress-free.  She has a history of breast cancer and this along with her age has caused her to be more in touch with her mortality.  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  However, she reports her most significant stressor is the relationship with her husband.  She reports he talks excessively, does not listen, repeats the same information several times, and constantly tells her things to do.  Patient's current symptoms include anger outburst, irritability, muscle tension, poor concentration, procrastination, and depressed mood.  Patient last was seen about 2 to 3 weeks ago for the assessment appointment.  She reports little to no change in her symptoms.  She continues to report irritability, anger outbursts, depressed mood, and muscle tension.  She reports stress related to marital issues, financial issues, and trying to put her affairs  in order.  Patient expresses anger and frustration regarding her husband as she reports he is very negative and blames her for their past financial decisions.  Patient also reports feeling disrespected.   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   Suicidal/Homicidal: Nowithout intent/plan  Therapist Response: Reviewed symptoms, discussed stressors, facilitated expression of thoughts and feelings, validated feelings, developed treatment plan, obtained patient's permission to electronically sign plan for patient, via patient with copy of treatment plan, began to orient patient to CBT, began to assist patient examine her pattern of interaction with husband as well as expectations of self and her husband, began to assist patient explore ways to schedule time for self and nurture self  Plan: Return again in 2 weeks.  Diagnosis: Current mild episode of major depressive disorder without prior episode ([place])  Collaboration of Care: Psychiatrist AEB patient is scheduled to see psychiatrist Dr. Harrington Challenger in this practice  Patient/Guardian was advised Release of Information must be obtained prior to any record release in order to collaborate their care with an outside provider. Patient/Guardian was advised if they have not already done so to contact the registration department to sign all necessary forms in order for [place] to release information regarding their care.   Consent: Patient/Guardian gives verbal consent for treatment and assignment of benefits for services provided during this visit. Patient/Guardian expressed understanding and agreed to proceed.   Alonza Smoker, LCSW 08/25/2021

## 2021-09-08 ENCOUNTER — Ambulatory Visit (HOSPITAL_COMMUNITY): Payer: PPO | Admitting: Psychiatry

## 2021-09-08 DIAGNOSIS — F320 Major depressive disorder, single episode, mild: Secondary | ICD-10-CM | POA: Diagnosis not present

## 2021-09-08 NOTE — Progress Notes (Signed)
IN-PERSON  THERAPIST PROGRESS NOTE  Session Time: Thursday 09/08/2021 1:05 AM  - 2:00 PM                                          Participation Level: Active  Behavioral Response: Well GroomedAlertDepressed  Type of Therapy: Individual Therapy  Treatment Goals addressed: Reduce frequency, intensity, and duration of depression symptoms AEB pt reducing episodes of anger,irritability, depressed mood to 1 x per week consistently for 60 days per pt's report / Amy Houston WILL IDENTIFY 3 COGNITIVE PATTERNS AND BELIEFS THAT SUPPORT DEPRESSION   ProgressTowards Goals: Progressing  Interventions: CBT and Supportive  Summary: Amy Houston is a 76 y.o. female who is referred for services by PCP Dr. Moshe Cipro due to pt experiencing symptoms of depression and transition issues. She denies any psychiatric hospitalizations. Pt and husband participated in couples' counseling several years ago. Pt currently is taking effexor- this is being managed by PCP.  Patient states being stressed to the max due to this chapter in her life.  She says she wants to spend the rest of her life stress-free.  She has a history of breast cancer and this along with her age has caused her to be more in touch with her mortality.  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  However, she reports her most significant stressor is the relationship with her husband.  She reports he talks excessively, does not listen, repeats the same information several times, and constantly tells her things to do.  Patient's current symptoms include anger outburst, irritability, muscle tension, poor concentration, procrastination, and depressed mood.  Patient last was seen about 2 to 3 weeks ago. She states doing pretty good since last session.  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  She also recently enjoyed having breakfast with her former coworkers.  She recently had  lunch with a friend.  She also enjoyed being with her daughters.  However, she expresses continued frustration with her husband as he remains negative and  constantly expresses regret about their past financial decisions.  She suspects he blames her for their past decisions and current situation.  She reports having a few conversations with him and asking him to stop talking about the past.  She reports continued irritability and anger with husband.    Suicidal/Homicidal: Nowithout intent/plan  Therapist Response: Reviewed symptoms, praised and reinforced patient's increased socialization and involvement in activity, discussed effects, assisted patient identify other activities to improve self-care and self nurture,  developed plan with patient to start walking 2 times per week for 30 minutes, assisted patient identify/address thoughts and processes that may inhibit plan, assisted patient link goals with outcomes, facilitated patient expressing thoughts and feelings regarding husband, assisted patient identify underlying feelings beneath anger, continued to assist patient examine her pattern of interaction with husband, assisted patient identify ways to improve assertive communication with husband with the use of I messages, assisted patient identify realistic expectations of husband    Plan: Return again in 2 weeks.  Diagnosis: No diagnosis found.  Collaboration of Care: Psychiatrist AEB patient is scheduled to see psychiatrist Dr. Harrington Challenger in this practice  Patient/Guardian was advised Release of Information must be obtained prior to any record release in order to collaborate their care with an outside provider. Patient/Guardian was advised if they have  not already done so to contact the registration department to sign all necessary forms in order for [place] to release information regarding their care.   Consent: Patient/Guardian gives verbal consent for treatment and assignment of benefits for services  provided during this visit. Patient/Guardian expressed understanding and agreed to proceed.   Alonza Smoker, LCSW 09/08/2021

## 2021-09-09 ENCOUNTER — Other Ambulatory Visit: Payer: Self-pay | Admitting: Family Medicine

## 2021-09-19 ENCOUNTER — Ambulatory Visit (INDEPENDENT_AMBULATORY_CARE_PROVIDER_SITE_OTHER): Payer: PPO | Admitting: Psychiatry

## 2021-09-19 DIAGNOSIS — F320 Major depressive disorder, single episode, mild: Secondary | ICD-10-CM

## 2021-09-19 NOTE — Progress Notes (Signed)
IN-PERSON  THERAPIST PROGRESS NOTE  Session Time: Monday 09/19/2021 11:06 AM - 12:00 PM                                                         Participation Level: Active  Behavioral Response: Well GroomedAlertDepressed  Type of Therapy: Individual Therapy  Treatment Goals addressed: Reduce frequency, intensity, and duration of depression symptoms AEB pt reducing episodes of anger,irritability, depressed mood to 1 x per week consistently for 60 days per pt's report / [name] WILL IDENTIFY 3 COGNITIVE PATTERNS AND BELIEFS THAT SUPPORT DEPRESSION   ProgressTowards Goals: Progressing  Interventions: CBT and Supportive  Summary: Amy Houston is a 75 y.o. female who is referred for services by PCP Dr. Moshe Cipro due to pt experiencing symptoms of depression and transition issues. She denies any psychiatric hospitalizations. Pt and husband participated in couples' counseling several years ago. Pt currently is taking effexor- this is being managed by PCP.  Patient states being stressed to the max due to this chapter in her life.  She says she wants to spend the rest of her life stress-free.  She has a history of breast cancer and this along with her age has caused her to be more in touch with her mortality.  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  However, she reports her most significant stressor is the relationship with her husband.  She reports he talks excessively, does not listen, repeats the same information several times, and constantly tells her things to do.  Patient's current symptoms include anger outburst, irritability, muscle tension, poor concentration, procrastination, and depressed mood.  Patient last was seen about 2 weeks ago. She states feeling better and reports improved mood as well as decreased irritability since last session.  Per her report, going on a retreat with fellow church members as well as staying involved in activities have  been very very helpful.  She has been socializing with family and friends.  She also continues to work as a Oceanographer.  Patient reports having a new perspective about life as well as interaction with her husband.  She reports having more realistic expectations of husband.  Patient also has been focusing more on areas she can change within herself.  She continues to report some stress and worry regarding financial issues   suicidal/Homicidal: Nowithout intent/plan  Therapist Response: Reviewed symptoms, praised and reinforced patient's increased involvement in activity/use of her spirituality/realistic expectations of husband, discussed effects on her mood/behavior/thoughts, assisted patient identify ways to maintain consistent efforts, developed plan with patient to do daily devotional/keep daily schedule/replace should and ought statement with wish and prefer to identify realistic expectations, continue to identify ways to improve assertive communication with husband with the use of messages, discussed patient's financial concerns and did problem solving regarding possible resources for assistance including consumer credit counseling   Plan: Return again in 2 weeks.  Diagnosis: Current mild episode of major depressive disorder without prior episode ([place])  Collaboration of Care: Psychiatrist AEB patient is scheduled to see psychiatrist Dr. Harrington Challenger in this practice  Patient/Guardian was advised Release of Information must be obtained prior to any record release in order to collaborate their care with an outside provider. Patient/Guardian was advised if they have not already done so to contact the  registration department to sign all necessary forms in order for [place] to release information regarding their care.   Consent: Patient/Guardian gives verbal consent for treatment and assignment of benefits for services provided during this visit. Patient/Guardian expressed understanding and agreed to  proceed.   Alonza Smoker, LCSW 09/19/2021

## 2021-09-23 ENCOUNTER — Other Ambulatory Visit: Payer: Self-pay | Admitting: Nurse Practitioner

## 2021-09-26 ENCOUNTER — Ambulatory Visit (HOSPITAL_COMMUNITY): Payer: Self-pay | Admitting: Psychiatry

## 2021-09-26 ENCOUNTER — Encounter (HOSPITAL_COMMUNITY): Payer: Self-pay | Admitting: Hematology

## 2021-10-03 ENCOUNTER — Ambulatory Visit: Payer: PPO | Admitting: Cardiology

## 2021-10-03 ENCOUNTER — Encounter: Payer: Self-pay | Admitting: Cardiology

## 2021-10-03 VITALS — BP 125/50 | HR 67 | Temp 97.8°F | Resp 16 | Ht <= 58 in | Wt 161.8 lb

## 2021-10-03 DIAGNOSIS — I7789 Other specified disorders of arteries and arterioles: Secondary | ICD-10-CM | POA: Insufficient documentation

## 2021-10-03 DIAGNOSIS — I10 Essential (primary) hypertension: Secondary | ICD-10-CM

## 2021-10-03 DIAGNOSIS — E7800 Pure hypercholesterolemia, unspecified: Secondary | ICD-10-CM

## 2021-10-03 NOTE — Progress Notes (Signed)
Primary Physician/Referring:  Fayrene Helper, MD  Patient ID: [name], female    DOB: 12-Jul-1945, 76 y.o.   MRN: [id number]  Chief Complaint  Patient presents with   Chest Pain   Hypertension   Follow-up    1 year   HPI:    KYLANI WIRES  is a 76 y.o. History of hypertension, hyperlipidemia, diabetes mellitus, mild obesity, family history of multiple ovarian/uterine or colon cancer, has had bilateral salpingo-oophorectomy in 2015 and bilateral mastectomy in 2014 and underwent chemotherapy for the same.     Due to abnormal stress test and chest pain suggestive of angina pectoris, cardiac catheterization on 03/16/2020 revealing hyperdynamic LVEF, no significant coronary artery disease but did find mild ectasia and mild luminal irregularity.  This is annual visit.  States that she is doing well and has not had any recurrence of angina pectoris.  She is accompanied by her husband.  Denies dyspnea, leg edema, PND or orthopnea.  Past Medical History:  Diagnosis Date   Abnormal genetic test 04/09/2013   PALB2 and CHEK2 positivity    Arthritis    "of the spine" (05/08/2012)   Breast cancer ([place]) 1995   "left" (05/08/2012)   Breast cancer, right breast, IDC, Multifocal 03/12/2012   Multifocal, 10 and 8 left breast    Diabetes mellitus 1999   "dx; never on meds" (05/08/2012)   Diabetes mellitus without complication ([place]) 40/76/8088   Essential hypertension, benign 01/08/2019   Exertional shortness of breath    Family history of thyroid cancer    GERD (gastroesophageal reflux disease)    tums   H/O hiatal hernia    Hemorrhoid    History of blood transfusion    post child birth   Hyperlipidemia    IBS (irritable bowel syndrome)    Osteopenia 12/11/2012   On Ca++ and Vit D. Bone density in April 2014.  Next bone density due in April 2016.   PONV (postoperative nausea and vomiting)    PPD positive, treated 1999    Family History  Problem Relation Age of Onset   Lung  cancer Mother 15       smoker   Depression Father    Leukemia Father 53       Hairy Cell at 60, CLL at 16   Lymphoma Father 1       Hodgkins lymphoma   Lung cancer Sister 4   Thyroid cancer Brother 18       Medullary   Lymphoma Brother 86       Follicular lymphoma   Breast cancer Maternal Aunt        mother's paternal half sister   Colon cancer Maternal Aunt        mother's maternal half sister   Lung cancer Maternal Uncle        mother's paternal half brother   Cancer Paternal Uncle        oral cancer dx in his 36s   Lung cancer Maternal Grandmother        smoker   Lung cancer Cousin        paternal cousin   Breast cancer Cousin        paternal cousin died in her 36s   Cancer Daughter        thyroid   Other Daughter        leiomyoma of esophagus   Thyroid cancer Daughter    Cancer Daughter        breast  Breast cancer Daughter     Social History   Tobacco Use   Smoking status: Never   Smokeless tobacco: Never  Substance Use Topics   Alcohol use: Not Currently    Comment: rare    Marital Status: Married   ROS  Review of Systems  Cardiovascular:  Negative for chest pain, dyspnea on exertion and leg swelling.  Musculoskeletal:  Positive for arthritis.  Gastrointestinal:  Negative for melena.   Objective  Blood pressure (!) 125/50, pulse 67, temperature 97.8 F (36.6 C), temperature source Temporal, resp. rate 16, height _0  (1.473 m), weight 161 lb 12.8 oz (73.4 kg), SpO2 97 %.     10/03/2021   10:30 AM 08/18/2021   11:51 AM 07/26/2021   10:17 AM  Vitals with BMI  Height _1  _2  _3   Weight 161 lbs 13 oz 155 lbs 13 oz 158 lbs 1 oz  BMI 33.83 24.23 53.61  Systolic 443 154 008  Diastolic 50 60 49  Pulse 67 80 57     Physical Exam Constitutional:      Comments: She is short stature and mildly obese in no acute distress.  Cardiovascular:     Rate and Rhythm: Normal rate and regular rhythm.     Pulses: Intact distal pulses.     Heart  sounds: Normal heart sounds. No murmur heard.    No gallop.     Comments: No leg edema, no JVD. Pulmonary:     Effort: Pulmonary effort is normal.     Breath sounds: Normal breath sounds.  Abdominal:     General: Bowel sounds are normal.     Palpations: Abdomen is soft.    Laboratory examination:   Recent Labs    05/16/21 0938 07/20/21 1119 08/11/21 1041  NA 141 144 141  K 4.3 4.5 4.0  CL 105 108* 110  CO2 _4 GLUCOSE 146* 135* 141*  BUN _5 CREATININE 0.96 0.86 0.93  CALCIUM 9.6 10.1 9.5  GFRNONAA  --   --  >60   CrCl cannot be calculated (Patient's most recent lab result is older than the maximum 21 days allowed.).     Latest Ref Rng & Units 08/11/2021   10:41 AM 07/20/2021   11:19 AM 05/16/2021    9:38 AM  CMP  Glucose 70 - 99 mg/dL 141  135  146   BUN 8 - 23 mg/dL _6 Creatinine 0.44 - 1.00 mg/dL 0.93  0.86  0.96   Sodium 135 - 145 mmol/L 141  144  141   Potassium 3.5 - 5.1 mmol/L 4.0  4.5  4.3   Chloride 98 - 111 mmol/L 110  108  105   CO2 22 - 32 mmol/L _7 Calcium 8.9 - 10.3 mg/dL 9.5  10.1  9.6   Total Protein 6.5 - 8.1 g/dL 7.3  6.6    Total Bilirubin 0.3 - 1.2 mg/dL 0.6  0.4    Alkaline Phos 38 - 126 U/L 61  73    AST 15 - 41 U/L 22  18    ALT 0 - 44 U/L 20  16        Latest Ref Rng & Units 08/11/2021   10:41 AM 08/09/2020   10:06 AM 03/12/2020   10:46 AM  CBC  WBC 4.0 - 10.5 K/uL 7.0  7.5  7.1   Hemoglobin 12.0 - 15.0  g/dL 12.8  12.3  12.1   Hematocrit 36.0 - 46.0 % 38.4  37.7  36.8   Platelets 150 - 400 K/uL 236  287  272     Lipid Panel Recent Labs    12/16/20 1209 04/20/21 1015 07/20/21 1119  CHOL 199 170 117  TRIG 152* 144 156*  LDLCALC 130* 103* 50  HDL 42 41 41  CHOLHDL 4.7* 4.1 2.9    HEMOGLOBIN A1C Lab Results  Component Value Date   HGBA1C 6.9 (H) 07/20/2021   MPG 163 04/29/2020   TSH Recent Labs    12/16/20 1209  TSH 1.220     Medications and allergies   Allergies  Allergen Reactions    Penicillins Rash    Has patient had a PCN reaction causing immediate rash, facial/tongue/throat swelling, SOB or lightheadedness with hypotension: Yes Has patient had a PCN reaction causing severe rash involving mucus membranes or skin necrosis: No Has patient had a PCN reaction that required hospitalization No Has patient had a PCN reaction occurring within the last 10 years: No If all of the above answers are "NO", then may proceed with Cephalosporin use.    Colchicine Other (See Comments)    Reports vomiting an diarrheah   Indomethacin Other (See Comments)    Reports vomiting and diarrheah     Current Outpatient Medications:    acetaminophen (TYLENOL) 500 MG tablet, Take 1,000 mg by mouth daily as needed for moderate pain or headache. , Disp: , Rfl:    albuterol (VENTOLIN HFA) 108 (90 Base) MCG/ACT inhaler, Inhale 2 puffs into the lungs every 6 (six) hours as needed for wheezing or shortness of breath., Disp: 8 g, Rfl: 0   aspirin EC 81 MG tablet, Take 81 mg by mouth daily. Swallow whole., Disp: , Rfl:    blood glucose meter kit and supplies, Dispense based on patient and insurance preference. Once daily testing DX E11.9, Disp: 1 each, Rfl: 0   Cholecalciferol (VITAMIN D) 2000 units CAPS, Take 6,000 Units by mouth at bedtime. , Disp: , Rfl:    fluticasone (FLONASE) 50 MCG/ACT nasal spray, Place 1 spray into both nostrils daily as needed for allergies or rhinitis., Disp: , Rfl:    glucose blood test strip, Use as instructed daily dx e11.9, Disp: 100 each, Rfl: 12   JARDIANCE 25 MG TABS tablet, Take 1 tablet (25 mg total) by mouth daily before breakfast., Disp: 30 tablet, Rfl: 0   Lancets 30G MISC, Once daily testing dx e11.9, Disp: 100 each, Rfl: 5   letrozole (FEMARA) 2.5 MG tablet, TAKE (1) TABLET BY MOUTH ONCE DAILY., Disp: 30 tablet, Rfl: 6   lisinopril (ZESTRIL) 40 MG tablet, Take 1 tablet (40 mg total) by mouth daily., Disp: 90 tablet, Rfl: 3   metoprolol succinate (TOPROL-XL) 100  MG 24 hr tablet, TAKE 1 TABLET BY MOUTH ONCE A DAY., Disp: 90 tablet, Rfl: 3   Misc Natural Products (TART CHERRY ADVANCED PO), Take by mouth. 1 daily, Disp: , Rfl:    Multiple Vitamin (MULTIVITAMIN WITH MINERALS) TABS, Take 1 tablet by mouth at bedtime. , Disp: , Rfl:    nitroGLYCERIN (NITROSTAT) 0.4 MG SL tablet, Place 0.4 mg under the tongue every 5 (five) minutes as needed for chest pain., Disp: , Rfl:    omeprazole (PRILOSEC) 20 MG capsule, TAKE ONE CAPSULE BY MOUTH ONCE DAILY., Disp: 90 capsule, Rfl: 3   ondansetron (ZOFRAN ODT) 4 MG disintegrating tablet, Take 1 tablet (4 mg total) by mouth  every 8 (eight) hours as needed for nausea or vomiting., Disp: 20 tablet, Rfl: 0   rosuvastatin (CRESTOR) 10 MG tablet, Take 1 tablet (10 mg total) by mouth daily., Disp: 90 tablet, Rfl: 0   venlafaxine XR (EFFEXOR-XR) 75 MG 24 hr capsule, TAKE ONE CAPSULE BY MOUTH TWO TIMES DAILY, Disp: 60 capsule, Rfl: 0    Radiology:   No results found.  Cardiac Studies:   PCV MYOCARDIAL PERFUSION WO LEXISCAN 02/23/2020  Narrative Exercise Myoview stress test 02/23/2020: Exercise nuclear stress test was performed using Bruce protocol. Patient reached 7 METS, and 88% of age predicted maximum heart rate. Exercise capacity was low. Non-limiting chest and back discomfort reported. Heart rate and hemodynamic response were normal. Peak EKG/ECG demonstrated sinus tachycardia, 1.5-2 mm horizontal/down-sloping ST depressions in leads II, III, aVF, V4-V6, persisting beyond 2 min into recovery. EKG changes are positive for ischemia. SPECT images show low ventricular volume, small sized, mild intensity, mildly reversible perfusion defect in basal inferoseptal, inferior myocardium. Stress LVEF 66%. Intermediate risk study.  Left Heart Catheterization 03/16/20:  LV: Normal size.  Hyperdynamic LVEF, >70%.  EDP mildly elevated at 14 mmHg.  No pressure gradient across the aortic valve. Left main: Large vessel.  Has mild  calcification. LAD: Large caliber vessel.  Has minimal luminal irregularity.  Mild calcification is evident in the proximal and mid segment.  D1 is moderate-sized with again mild luminal irregularity. CX: Moderate calibered vessel, distal circumflex has mild ectasia, there is mild diffuse disease noted. RI: Small vessel, smooth and normal. RCA: Dominant vessel.  Ostium has 10 to 20% stenosis.  There is minimal diffuse disease in the right coronary artery.   Impression: Chest pain and abnormal stress test is related to microvascular angina, patient with hyperdynamic LV, elevated blood pressure, hypertensive heart disease also can explain some of her symptoms and abnormal EKG response to exercise.  Patient was hypertensive during cardiac catheterization.  She also has small vessel disease that may explain her anginal symptoms.  Medical therapy for now with risk factor modification.   EKG:   EKG 10/03/2021: Normal sinus rhythm at rate of 59 bpm, normal axis, no evidence of ischemia, normal EKG. NO SIGNIFICANT CHANGE FROM 10/01/2020.     Assessment     ICD-10-CM   1. Coronary artery ectasia (HCC)  I77.89     2. Primary hypertension  I10 EKG 12-Lead    3. Hypercholesteremia  E78.00       Medications Discontinued During This Encounter  Medication Reason   calcium-vitamin D (OSCAL WITH D) 500-200 MG-UNIT tablet      No orders of the defined types were placed in this encounter.  Orders Placed This Encounter  Procedures   EKG 12-Lead   Recommendations:   NIKIESHA MILFORD is a 76 y.o. History of hypertension, hyperlipidemia, diabetes mellitus, mild obesity, family history of multiple ovarian/uterine or colon cancer, has had bilateral salpingo-oophorectomy in 2015 and bilateral mastectomy in 2014 and underwent chemotherapy for the same.     Due to abnormal stress test and chest pain suggestive of angina pectoris, cardiac catheterization on 03/16/2020 revealing hyperdynamic LVEF, no significant  coronary artery disease but did find mild ectasia and mild luminal irregularity.    Since being on aggressive medical therapy.  She has not had any recurrence of angina pectoris.  Today, no clinical evidence of heart failure, blood pressure is well controlled, I also reviewed her lipids which are under excellent control.  EKG is essentially normal.  I have  recommended that we continue present medical therapy, I will see her back on a as needed basis.     Adrian Prows, MD, [facility] 10/03/2021, 1:16 PM Office: [phone]

## 2021-10-04 ENCOUNTER — Ambulatory Visit (HOSPITAL_COMMUNITY): Payer: Self-pay | Admitting: Psychiatry

## 2021-10-05 ENCOUNTER — Ambulatory Visit (HOSPITAL_COMMUNITY): Payer: PPO | Admitting: Psychiatry

## 2021-10-11 ENCOUNTER — Encounter (HOSPITAL_COMMUNITY): Payer: Self-pay | Admitting: Psychiatry

## 2021-10-11 ENCOUNTER — Telehealth (INDEPENDENT_AMBULATORY_CARE_PROVIDER_SITE_OTHER): Payer: PPO | Admitting: Psychiatry

## 2021-10-11 DIAGNOSIS — F3340 Major depressive disorder, recurrent, in remission, unspecified: Secondary | ICD-10-CM

## 2021-10-11 DIAGNOSIS — E559 Vitamin D deficiency, unspecified: Secondary | ICD-10-CM

## 2021-10-11 NOTE — Patient Instructions (Signed)
We may no medication changes today. It is ok for you to continue the venlafaxine indefinitely at this point. Just be sure to have your sodium checked every 6 months to one year.

## 2021-10-11 NOTE — Progress Notes (Signed)
Psychiatric Initial Adult Assessment  Patient Identification: Amy Houston MRN:  [id number] Date of Evaluation:  10/11/2021 Referral Source: therapist  Assessment:  Amy Houston is a 76 y.o. female with a history of mild major depressive disorder, vitamin d deficency, hypertension, hyperlipidemia, diabetes mellitus, mild obesity, family history of multiple ovarian/uterine or colon cancer, has had bilateral salpingo-oophorectomy in 2015 and bilateral mastectomy in 2014 and underwent chemotherapy for the same who presents to [facility] via video conferencing for initial evaluation of medication management.  Patient reports overall improving life stressors and denies any symptoms within typical depression cluster. At this point in time, would consider her major depressive disorder in early remission. With regard to her venlafaxine, she is still reporting benefit and with early remission from major depression would be best served by continuing for at least 6-8 months symptom free before lowering dose. As long as routine sodium levels are monitored, she would likely be fine to continue this medication indefinitely at this point. She will follow up with her primary care provider for ongoing medication management.  Plan:  # Major depressive disorder, in early remission Past medication trials: venlafaxine ER Status of problem: new to provider Interventions: -- continue venlafaxine ER $RemoveBefore'75mg'rhZjDlBBzJlxl$  twice daily  # Vitamin d deficiency Past medication trials: cholecalciferol Status of problem: new to provider Interventions: -- continue cholecalciferol 6000u nightly  Patient was given contact information for behavioral health clinic and was instructed to call 911 for emergencies.   Subjective:  Chief Complaint:  Chief Complaint  Patient presents with   Medication Consultation    History of Present Illness:  Thinks appointment was to review medications. Taking venlafaxine  since 2002 for hot flashes at the time. Has been helpful as family members have died and other life experiences. Wonders with being on it for 20 years if there are long term issues. Currently taking $RemoveBeforeDE'75mg'kXcJshdaAIsUqmO$  twice daily after titration in the past.   Lives with husband of 37 years and with both being retired like to have time away from each other. Two adult daughters, 33 and 65. Two grandchildren, 17 and 16. Likes travel and still enjoys it. Sleeps fairly well. About 7-8hrs per night and feels rested. Appetite "eats everything in sight." Midmorning lunch and early supper with post supper snacks. Only binges are Thanksgiving or if going to a buffet. No purging or restricting. Concentration is good. No guilt feelings. No psychomotor symptoms. No SI past or present.   Not chronic worrier. Two time breast cancer survivor and only time with reduced sleep occurred in setting of surgeries/treatment. No hallucinations. No paranoia.   No alcohol unless on vacation, margarita or glass of wine. No tobacco products. No other drugs.   Reviewed venlafaxine with patient. She prefers to maintain current dose as it has been effective when combined with psychotherapy.    Past Psychiatric History:  Diagnoses: none that she knows of Medication trials: venlafaxine Previous psychiatrist/therapist: yes, through [facility] Hospitalizations: none Suicide attempts: none SIB: none Hx of violence towards others: none Current access to guns: yes, secured in gun safe. Handgun and she doesn't know how to use it Hx of abuse: none  Previous Psychotropic Medications: Yes   Substance Abuse History in the last 12 months:  No.  Past Medical History:  Past Medical History:  Diagnosis Date   Abnormal genetic test 04/09/2013   PALB2 and CHEK2 positivity    Arthritis    "of the spine" (05/08/2012)   Breast cancer ([place]) 1995   "  left" (05/08/2012)   Breast cancer, right breast, IDC, Multifocal 03/12/2012   Multifocal, 10  and 8 left breast    Depression, major, single episode, severe ([place]) 04/21/2021   Diabetes mellitus 1999   "dx; never on meds" (05/08/2012)   Diabetes mellitus without complication ([place]) 50/35/4656   Essential hypertension, benign 01/08/2019   Exertional shortness of breath    Family history of thyroid cancer    GERD (gastroesophageal reflux disease)    tums   H/O hiatal hernia    Hemorrhoid    History of blood transfusion    post child birth   Hyperlipidemia    IBS (irritable bowel syndrome)    Osteopenia 12/11/2012   On Ca++ and Vit D. Bone density in April 2014.  Next bone density due in April 2016.   PONV (postoperative nausea and vomiting)    PPD positive, treated 1999    Past Surgical History:  Procedure Laterality Date   ABDOMINAL HYSTERECTOMY Bilateral 01/30/2013   Procedure: HYSTERECTOMY ABDOMINAL;  Surgeon: Melina Schools, MD;  Location: [facility];  Service: Gynecology;  Laterality: Bilateral;   BREAST BIOPSY Left 1995   BREAST BIOPSY Bilateral 03/2012   "one on the left; 2 on the right" (05/08/2012)   [place]   COLONOSCOPY  12/06/93   Dr. Hayes:single diminutive polyp of the descending colon/extrernal hemorrhoids, benign path   COLONOSCOPY  11/30/98   Dr Hayes:small rectal polyp/internal hemorrhoids, benign path   COLONOSCOPY  11/26/2006   Dr. Hayes:sigmoid polyp/small interal hemorrhoids, adenomatous   COLONOSCOPY N/A 03/30/2016   Dr. Oneida Alar: normal TI, 4 mm rectal polyp, multiple small and large-mouthed diverticula in rectosigmoid and sigmoid colon, non-bleeding internal hemorrhoids, tubular adenoma on path. surveillance in 5 yeras   COLONOSCOPY WITH ESOPHAGOGASTRODUODENOSCOPY (EGD) N/A 07/01/2012   Dr. Oneida Alar: 2 small adenomas, sigmoid colon diverticula, surveillance in 2019   [place]  07/01/2012   Dr. Oneida Alar: proximal esophageal web, Schatzki's ring at [employer] junction, s/p  dilaiton. Mild non-erosive gastritis. Repeat EGD July 23, 2012.    ESOPHAGOGASTRODUODENOSCOPY  1994   Dr. Cristina Gong: small hiatal hernia and Schatzki's ring widely patent, CLO test negative   ESOPHAGOGASTRODUODENOSCOPY (EGD) WITH ESOPHAGEAL DILATION N/A 07/23/2012   Dr. Fields:proximal esophageal web v. radiation induced stricture/schatzki ring/small HH, s/p dilation   LEFT HEART CATH AND CORONARY ANGIOGRAPHY N/A 03/16/2020   Procedure: LEFT HEART CATH AND CORONARY ANGIOGRAPHY;  Surgeon: Adrian Prows, MD;  Location: [facility];  Service: Cardiovascular;  Laterality: N/A;   MASTECTOMY COMPLETE / SIMPLE Left 05/08/2012   MASTECTOMY COMPLETE / SIMPLE W/ SENTINEL NODE BIOPSY Right 05/08/2012   SALPINGOOPHORECTOMY Bilateral 01/30/2013   Procedure: SALPINGO OOPHORECTOMY;  Surgeon: Melina Schools, MD;  Location: [facility];  Service: Gynecology;  Laterality: Bilateral;   SIMPLE MASTECTOMY WITH AXILLARY SENTINEL NODE BIOPSY Right 05/08/2012   Procedure: RIGHT TOTAL MASTECTOMY WITH AXILLARY SENTINEL NODE BIOPSY;  Surgeon: Haywood Lasso, MD;  Location: [facility];  Service: General;  Laterality: Right;   SIMPLE MASTECTOMY WITH AXILLARY SENTINEL NODE BIOPSY Left 05/08/2012   Procedure: LEFT TOTAL MASTECTOMY;  Surgeon: Haywood Lasso, MD;  Location: [facility];  Service: General;  Laterality: Left;   TONSILLECTOMY AND ADENOIDECTOMY  ~ 2008   TUBAL LIGATION  1976   UVULOPALATOPHARYNGOPLASTY, TONSILLECTOMY AND SEPTOPLASTY  ~ 2008    Family Psychiatric History: father PTSD related to war  Family History:  Family History  Problem Relation Age of Onset   Lung cancer Mother 49       smoker   Depression Father    Leukemia Father 44       Hairy Cell at 36, CLL at 59   Lymphoma Father 34       Hodgkins lymphoma   Lung cancer Sister 40   Thyroid cancer Brother 79       Medullary   Lymphoma Brother 63       Follicular lymphoma   Breast cancer Maternal Aunt        mother's paternal half sister   Colon cancer  Maternal Aunt        mother's maternal half sister   Lung cancer Maternal Uncle        mother's paternal half brother   Cancer Paternal Uncle        oral cancer dx in his 68s   Lung cancer Maternal Grandmother        smoker   Lung cancer Cousin        paternal cousin   Breast cancer Cousin        paternal cousin died in her 72s   Cancer Daughter        thyroid   Other Daughter        leiomyoma of esophagus   Thyroid cancer Daughter    Cancer Daughter        breast   Breast cancer Daughter     Social History:   Social History   Socioeconomic History   Marital status: Married    Spouse name: Mortimer Fries   Number of children: 2   Years of education: 12th grade   Highest education level: Not on file  Occupational History   Occupation: retired    Fish farm manager: [employer]  Tobacco Use   Smoking status: Never   Smokeless tobacco: Never  Vaping Use   Vaping Use: Never used  Substance and Sexual Activity   Alcohol use: Not Currently    Comment: rare    Drug use: No   Sexual activity: Not Currently  Other Topics Concern   Not on file  Social History Narrative   Married 75 yrs.Lives with her husband.Retired.   Social Determinants of Health   Financial Resource Strain: Low Risk  (01/06/2021)   Overall Financial Resource Strain (CARDIA)    Difficulty of Paying Living Expenses: Not hard at all  Food Insecurity: No Food Insecurity (01/06/2021)   Hunger Vital Sign    Worried About Running Out of Food in the Last Year: Never true    Ran Out of Food in the Last Year: Never true  Transportation Needs: No Transportation Needs (01/06/2021)   PRAPARE - Hydrologist (Medical): No    Lack of Transportation (Non-Medical): No  Physical Activity: Inactive (01/06/2021)   Exercise Vital Sign    Days of Exercise per Week: 0 days    Minutes of Exercise per Session: 0 min  Stress: No Stress Concern Present (01/06/2021)   [place]    Feeling of Stress : Only a little  Social Connections: Socially Integrated (01/06/2021)   Social Connection and Isolation Panel [NHANES]    Frequency of Communication with Friends and Family: More than three times a week    Frequency of Social Gatherings with Friends and Family: Three times a week    Attends Religious Services: More than 4 times per year  Active Member of Clubs or Organizations: Yes    Attends Archivist Meetings: More than 4 times per year    Marital Status: Married    Additional Social History: see HPI  Allergies:   Allergies  Allergen Reactions   Penicillins Rash    Has patient had a PCN reaction causing immediate rash, facial/tongue/throat swelling, SOB or lightheadedness with hypotension: Yes Has patient had a PCN reaction causing severe rash involving mucus membranes or skin necrosis: No Has patient had a PCN reaction that required hospitalization No Has patient had a PCN reaction occurring within the last 10 years: No If all of the above answers are "NO", then may proceed with Cephalosporin use.    Colchicine Other (See Comments)    Reports vomiting an diarrheah   Indomethacin Other (See Comments)    Reports vomiting and diarrheah    Current Medications: Current Outpatient Medications  Medication Sig Dispense Refill   acetaminophen (TYLENOL) 500 MG tablet Take 1,000 mg by mouth daily as needed for moderate pain or headache.      albuterol (VENTOLIN HFA) 108 (90 Base) MCG/ACT inhaler Inhale 2 puffs into the lungs every 6 (six) hours as needed for wheezing or shortness of breath. 8 g 0   aspirin EC 81 MG tablet Take 81 mg by mouth daily. Swallow whole.     blood glucose meter kit and supplies Dispense based on patient and insurance preference. Once daily testing DX E11.9 1 each 0   Cholecalciferol (VITAMIN D) 2000 units CAPS Take 6,000 Units by mouth at bedtime.      fluticasone (FLONASE) 50 MCG/ACT nasal spray  Place 1 spray into both nostrils daily as needed for allergies or rhinitis.     glucose blood test strip Use as instructed daily dx e11.9 100 each 12   JARDIANCE 25 MG TABS tablet Take 1 tablet (25 mg total) by mouth daily before breakfast. 30 tablet 0   Lancets 30G MISC Once daily testing dx e11.9 100 each 5   letrozole (FEMARA) 2.5 MG tablet TAKE (1) TABLET BY MOUTH ONCE DAILY. 30 tablet 6   lisinopril (ZESTRIL) 40 MG tablet Take 1 tablet (40 mg total) by mouth daily. 90 tablet 3   metoprolol succinate (TOPROL-XL) 100 MG 24 hr tablet TAKE 1 TABLET BY MOUTH ONCE A DAY. 90 tablet 3   Misc Natural Products (TART CHERRY ADVANCED PO) Take by mouth. 1 daily     Multiple Vitamin (MULTIVITAMIN WITH MINERALS) TABS Take 1 tablet by mouth at bedtime.      nitroGLYCERIN (NITROSTAT) 0.4 MG SL tablet Place 0.4 mg under the tongue every 5 (five) minutes as needed for chest pain.     omeprazole (PRILOSEC) 20 MG capsule TAKE ONE CAPSULE BY MOUTH ONCE DAILY. 90 capsule 3   ondansetron (ZOFRAN ODT) 4 MG disintegrating tablet Take 1 tablet (4 mg total) by mouth every 8 (eight) hours as needed for nausea or vomiting. 20 tablet 0   rosuvastatin (CRESTOR) 10 MG tablet Take 1 tablet (10 mg total) by mouth daily. 90 tablet 0   venlafaxine XR (EFFEXOR-XR) 75 MG 24 hr capsule TAKE ONE CAPSULE BY MOUTH TWO TIMES DAILY 60 capsule 0   No current facility-administered medications for this visit.    ROS: Review of Systems  Constitutional:  Negative for appetite change and unexpected weight change.  Psychiatric/Behavioral:  Negative for decreased concentration, dysphoric mood, hallucinations, self-injury, sleep disturbance and suicidal ideas. The patient is not nervous/anxious.  Objective:  Psychiatric Specialty Exam: There were no vitals taken for this visit.There is no height or weight on file to calculate BMI.  General Appearance: Casual, Neat, Well Groomed, and appears stated age  Eye Contact:  Good  Speech:   Clear and Coherent and Normal Rate  Volume:  Normal  Mood:   "good"  Affect:  Appropriate, Congruent, Full Range, and jovial  Thought Content: Logical and Hallucinations: None   Suicidal Thoughts:  No  Homicidal Thoughts:  No  Thought Process:  Coherent, Goal Directed, and Linear  Orientation:  Full (Time, Place, and Person)    Memory:  Immediate;   Good Recent;   Good Remote;   Good  Judgment:  Good  Insight:  Good  Concentration:  Concentration: Good and Attention Span: Good  Recall:  Good; remembered 3 words at 2 min  Fund of Knowledge: Good  Language: Good  Psychomotor Activity:  Normal  Akathisia:  No  AIMS (if indicated): not done  Assets:  Communication Skills Desire for Improvement Financial Resources/Insurance Housing Intimacy Leisure Time Physical Health Resilience Social Support Talents/Skills Transportation  ADL's:  Intact  Cognition: WNL  Sleep:  Good   PE: General: sits comfortably in view of camera; no acute distress  Pulm: no increased work of breathing on room air  MSK: all extremity movements appear intact  Neuro: no focal neurological deficits observed  Gait & Station: unable to assess by video    Metabolic Disorder Labs: Lab Results  Component Value Date   HGBA1C 6.9 (H) 07/20/2021   MPG 163 04/29/2020   MPG 157 01/27/2020   No results found for: "PROLACTIN" Lab Results  Component Value Date   CHOL 117 07/20/2021   TRIG 156 (H) 07/20/2021   HDL 41 07/20/2021   CHOLHDL 2.9 07/20/2021   VLDL 26 12/29/2015   LDLCALC 50 07/20/2021   LDLCALC 103 (H) 04/20/2021   Lab Results  Component Value Date   TSH 1.220 12/16/2020    Therapeutic Level Labs: No results found for: "LITHIUM" No results found for: "CBMZ" No results found for: "VALPROATE"  Screenings:  GAD-7    Flowsheet Row Counselor from 08/04/2021 in [facility]  Total GAD-7 Score 7      Mini-Mental    [place] [place] Office Visit  from 07/26/2021 in [facility]  Total Score (max 30 points ) 30      PHQ2-9    Flowsheet Row Counselor from 08/04/2021 in [facility] from 07/27/2021 in [facility] Office Visit from 07/26/2021 in [facility] Office Visit from 05/20/2021 in [facility] Office Visit from 04/21/2021 in [facility]  PHQ-2 Total Score 2 4 4  0 6  PHQ-9 Total Score 7 14 10  -- 10      Flowsheet Row Video Visit from 10/11/2021 in [facility] from 08/04/2021 in [facility] from 04/07/2021 in [facility] [place] No Risk No Risk No Risk       Collaboration of Care: Collaboration of Care: Referral or follow-up with counselor/therapist AEB patient to continue psychotherapy  Patient/Guardian was advised Release of Information must be obtained prior to any record release in order to collaborate their care with an outside provider. Patient/Guardian was advised if they have not already done so to contact the registration department to sign all necessary forms in order for [place] to release  information regarding their care.   Consent: Patient/Guardian gives verbal consent for treatment and assignment of benefits for services provided during this visit. Patient/Guardian expressed understanding and agreed to proceed.   Televisit via video: I connected with Amy Houston on 10/11/21 at  4:00 PM EDT by a video enabled telemedicine application and verified that I am speaking with the correct person using two identifiers.  Location: Patient: Amy Houston at home Provider: home office   I discussed the limitations of evaluation and management by telemedicine and the availability of in person appointments. The patient expressed understanding and agreed to  proceed.  I discussed the assessment and treatment plan with the patient. The patient was provided an opportunity to ask questions and all were answered. The patient agreed with the plan and demonstrated an understanding of the instructions.   The patient was advised to call back or seek an in-person evaluation if the symptoms worsen or if the condition fails to improve as anticipated.  I provided 35 minutes of non-face-to-face time during this encounter.  Jacquelynn Cree, MD 10/10/20234:48 PM

## 2021-10-14 ENCOUNTER — Other Ambulatory Visit: Payer: Self-pay | Admitting: Family Medicine

## 2021-10-14 ENCOUNTER — Ambulatory Visit (INDEPENDENT_AMBULATORY_CARE_PROVIDER_SITE_OTHER): Payer: PPO | Admitting: Internal Medicine

## 2021-10-14 ENCOUNTER — Encounter: Payer: Self-pay | Admitting: Internal Medicine

## 2021-10-14 DIAGNOSIS — J069 Acute upper respiratory infection, unspecified: Secondary | ICD-10-CM

## 2021-10-14 DIAGNOSIS — B9689 Other specified bacterial agents as the cause of diseases classified elsewhere: Secondary | ICD-10-CM | POA: Diagnosis not present

## 2021-10-14 MED ORDER — AZITHROMYCIN 250 MG PO TABS: ORAL_TABLET | ORAL | 0 refills | Status: AC

## 2021-10-14 NOTE — Progress Notes (Signed)
   Acute Telephone Visit  Virtual Visit via Telephone Note  I connected with Amy Houston on 10/14/21 at 11:40 AM EDT by telephone and verified that I am speaking with the correct person using two identifiers.  Location: Patient: [place], [place] [address] Provider: [address], [place], [place] [address]   I discussed the limitations, risks, security and privacy concerns of performing an evaluation and management service by telephone and the availability of in person appointments. I also discussed with the patient that there may be a patient responsible charge related to this service. The patient expressed understanding and agreed to proceed.   History of Present Illness:  Amy Houston is seen today for an acute visit via telephone encounter for a 1 week history of respiratory symptoms.  She describes onset of throat irritation 1 week ago that has progressed to a cough productive of white/yellow sputum.  She endorses chills but has checked her temperature and does not have a fever.  She has taken a home COVID test that was negative.  Amy Houston denies sinus pain/pressure.  She does endorse a dull headache.  Amy Houston has been taking Coricidin HBP for symptom relief with minimal improvement.  Overall feels as though her symptoms are getting worse despite supportive care measures.  Assessment and Plan:  Bacterial upper respiratory infection Seen today via virtual encounter for 1 week history of throat irritation and a cough with white/yellow sputum production.  Symptoms are worsening despite conservative treatment measures. -I have prescribed azithromycin x5 days for for treatment of bacterial infection -She has follow-up with Dr. Moshe Cipro scheduled for early December, but will return to care if her symptoms do not improve despite antibiotic treatment.  Follow Up Instructions:  I discussed the assessment and treatment plan with the patient. The patient was provided an  opportunity to ask questions and all were answered. The patient agreed with the plan and demonstrated an understanding of the instructions.   The patient was advised to call back or seek an in-person evaluation if the symptoms worsen or if the condition fails to improve as anticipated.  I provided 9 minutes of non-face-to-face time during this encounter.   Johnette Abraham, MD

## 2021-10-19 ENCOUNTER — Ambulatory Visit (HOSPITAL_COMMUNITY): Payer: PPO | Admitting: Psychiatry

## 2021-10-19 ENCOUNTER — Encounter: Payer: Self-pay | Admitting: Internal Medicine

## 2021-10-19 DIAGNOSIS — F3340 Major depressive disorder, recurrent, in remission, unspecified: Secondary | ICD-10-CM

## 2021-10-19 NOTE — Progress Notes (Signed)
IN-PERSON  THERAPIST PROGRESS NOTE  Session Time: Wednesday  10/19/2021 11:07 AM - 12:05 PM                                                Participation Level: Active  Behavioral Response: Well GroomedAlertDepressed  Type of Therapy: Individual Therapy  Treatment Goals addressed: Reduce frequency, intensity, and duration of depression symptoms AEB pt reducing episodes of anger,irritability, depressed mood to 1 x per week consistently for 60 days per pt's report / Amy Houston WILL IDENTIFY 3 COGNITIVE PATTERNS AND BELIEFS THAT SUPPORT DEPRESSION          ProgressTowards Goals: Progressing  Interventions: CBT and Supportive  Summary: Amy Houston is a 76 y.o. female who is referred for services by PCP Dr. Moshe Cipro due to pt experiencing symptoms of depression and transition issues. She denies any psychiatric hospitalizations. Pt and husband participated in couples' counseling several years ago. Pt currently is taking effexor- this is being managed by PCP.  Patient states being stressed to the max due to this chapter in her life.  She says she wants to spend the rest of her life stress-free.  She has a history of breast cancer and this along with her age has caused her to be more in touch with her mortality.  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  However, she reports her most significant stressor is the relationship with her husband.  She reports he talks excessively, does not listen, repeats the same information several times, and constantly tells her things to do.  Patient's current symptoms include anger outburst, irritability, muscle tension, poor concentration, procrastination, and depressed mood.  Patient last was seen about 4 weeks ago. She reports continued improved mood and decreased irritability since last session.  She reports continued efforts regarding having more realistic expectations of husband.  She reports improved communication and  cites recent example of using I messages and conversation with husband.  She states trying to stop and think before she responds to her husband's behavior.  She reports decreased stress regarding financial concerns as she and her husband attended an appointment with consumer credit counseling.  She reports she and her husband both were receptive to recommendations and are working together to address their financial needs.  Patient continues to use her spirituality and maintains involvement in activity but has not been as active as she has been sick.  She just completed a round of antibiotics and is beginning to feel better.  She is very excited today as she and her husband are going on a 10-day vacation beginning next week.    suicidal/Homicidal: Nowithout intent/plan  Therapist Response: Reviewed symptoms, praised and reinforced patient's continued efforts regarding having more realistic expectations of husband/use of assertive communication/following through with appointment with consumer credit counseling, discussed effects of her efforts, praised and reinforced patient's efforts to take a pause before responding to husband, introduced mindfulness and discussed rationale for practicing and mindfulness activity, assisted patient practice mindfulness activity using breath awareness, processed activity with patient, developed plan with patient to practice mindfulness activity between sessions, checked out audio interactive activity to patient and provided patient with access code, discussed stepdown plan to termination to include 2-3 more sessions focusing on relapse prevention   Plan: Return again in 2 weeks.  Diagnosis: Recurrent major depressive disorder,  in remission [facility])  Collaboration of Care: Psychiatrist AEB patient is scheduled to see psychiatrist Dr. Harrington Challenger in this practice  Patient/Guardian was advised Release of Information must be obtained prior to any record release in order to collaborate  their care with an outside provider. Patient/Guardian was advised if they have not already done so to contact the registration department to sign all necessary forms in order for [place] to release information regarding their care.   Consent: Patient/Guardian gives verbal consent for treatment and assignment of benefits for services provided during this visit. Patient/Guardian expressed understanding and agreed to proceed.   Alonza Smoker, LCSW 10/19/2021

## 2021-10-20 ENCOUNTER — Ambulatory Visit (INDEPENDENT_AMBULATORY_CARE_PROVIDER_SITE_OTHER): Payer: PPO | Admitting: Internal Medicine

## 2021-10-20 ENCOUNTER — Encounter: Payer: Self-pay | Admitting: Internal Medicine

## 2021-10-20 DIAGNOSIS — J0110 Acute frontal sinusitis, unspecified: Secondary | ICD-10-CM | POA: Diagnosis not present

## 2021-10-20 MED ORDER — MOXIFLOXACIN HCL 400 MG PO TABS: 400.0000 mg | ORAL_TABLET | Freq: Every day | ORAL | 0 refills | Status: AC

## 2021-10-20 MED ORDER — NOREL AD 4-10-325 MG PO TABS: 1.0000 | ORAL_TABLET | Freq: Three times a day (TID) | ORAL | 0 refills | Status: AC | PRN

## 2021-10-20 MED ORDER — FLUTICASONE PROPIONATE 50 MCG/ACT NA SUSP: 1.0000 | Freq: Every day | NASAL | 2 refills | Status: DC | PRN

## 2021-10-20 NOTE — Progress Notes (Signed)
Virtual Visit via Telephone Note   This visit type was conducted via telephone. This format is felt to be most appropriate for this patient at this time.  The patient did not have access to video technology/had technical difficulties with video requiring transitioning to audio format only (telephone).  All issues noted in this document were discussed and addressed.  No physical exam could be performed with this format.  Evaluation Performed:  Follow-up visit  Date:  10/20/2021   ID:  Amy Houston, DOB 1945-09-03, MRN [id number]  Patient Location: Home Provider Location: Office/Clinic  Participants: Patient Location of Patient: Home Location of Provider: Telehealth Consent was obtain for visit to be over via telehealth. I verified that I am speaking with the correct person using two identifiers.  PCP:  Fayrene Helper, MD   Chief Complaint: Nasal congestion and cough  History of Present Illness:    Amy Houston is a 76 y.o. female who has a televisit for c/o nasal congestion and cough for the last 2 weeks.  She had azithromycin in the last week.  She still has persistent symptoms and has not had any improvement in her nasal congestion, postnasal drip and sinus pressure.  Denies any dyspnea currently.  Denies any fever or chills currently.  She feels fatigued throughout the day.  The patient does not have symptoms concerning for COVID-19 infection (fever, chills, cough, or new shortness of breath).   Past Medical, Surgical, Social History, Allergies, and Medications have been Reviewed.  Past Medical History:  Diagnosis Date   Abnormal genetic test 04/09/2013   PALB2 and CHEK2 positivity    Arthritis    "of the spine" (05/08/2012)   Breast cancer ([place]) 1995   "left" (05/08/2012)   Breast cancer, right breast, IDC, Multifocal 03/12/2012   Multifocal, 10 and 8 left breast    Depression, major, single episode, severe ([place]) 04/21/2021   Diabetes mellitus 1999   "dx;  never on meds" (05/08/2012)   Diabetes mellitus without complication ([place]) 53/00/5110   Essential hypertension, benign 01/08/2019   Exertional shortness of breath    Family history of thyroid cancer    GERD (gastroesophageal reflux disease)    tums   H/O hiatal hernia    Hemorrhoid    History of blood transfusion    post child birth   Hyperlipidemia    IBS (irritable bowel syndrome)    Osteopenia 12/11/2012   On Ca++ and Vit D. Bone density in April 2014.  Next bone density due in April 2016.   PONV (postoperative nausea and vomiting)    PPD positive, treated 1999   Past Surgical History:  Procedure Laterality Date   ABDOMINAL HYSTERECTOMY Bilateral 01/30/2013   Procedure: HYSTERECTOMY ABDOMINAL;  Surgeon: Melina Schools, MD;  Location: [facility];  Service: Gynecology;  Laterality: Bilateral;   BREAST BIOPSY Left 1995   BREAST BIOPSY Bilateral 03/2012   "one on the left; 2 on the right" (05/08/2012)   [address]   COLONOSCOPY  12/06/93   Dr. Hayes:single diminutive polyp of the descending colon/extrernal hemorrhoids, benign path   COLONOSCOPY  11/30/98   Dr Hayes:small rectal polyp/internal hemorrhoids, benign path   COLONOSCOPY  11/26/2006   Dr. Hayes:sigmoid polyp/small interal hemorrhoids, adenomatous   COLONOSCOPY N/A 03/30/2016   Dr. Oneida Alar: normal TI, 4 mm rectal polyp, multiple small and large-mouthed diverticula in rectosigmoid and sigmoid colon, non-bleeding internal hemorrhoids, tubular adenoma on path. surveillance in  5 yeras   COLONOSCOPY WITH ESOPHAGOGASTRODUODENOSCOPY (EGD) N/A 07/01/2012   Dr. Oneida Alar: 2 small adenomas, sigmoid colon diverticula, surveillance in 2019   [place]  07/01/2012   Dr. Oneida Alar: proximal esophageal web, Schatzki's ring at GE junction, s/p dilaiton. Mild non-erosive gastritis. Repeat EGD July 23, 2012.    ESOPHAGOGASTRODUODENOSCOPY  1994   Dr. Cristina Gong: small  hiatal hernia and Schatzki's ring widely patent, CLO test negative   ESOPHAGOGASTRODUODENOSCOPY (EGD) WITH ESOPHAGEAL DILATION N/A 07/23/2012   Dr. Fields:proximal esophageal web v. radiation induced stricture/schatzki ring/small HH, s/p dilation   LEFT HEART CATH AND CORONARY ANGIOGRAPHY N/A 03/16/2020   Procedure: LEFT HEART CATH AND CORONARY ANGIOGRAPHY;  Surgeon: Adrian Prows, MD;  Location: [facility];  Service: Cardiovascular;  Laterality: N/A;   MASTECTOMY COMPLETE / SIMPLE Left 05/08/2012   MASTECTOMY COMPLETE / SIMPLE W/ SENTINEL NODE BIOPSY Right 05/08/2012   SALPINGOOPHORECTOMY Bilateral 01/30/2013   Procedure: SALPINGO OOPHORECTOMY;  Surgeon: Melina Schools, MD;  Location: [facility];  Service: Gynecology;  Laterality: Bilateral;   SIMPLE MASTECTOMY WITH AXILLARY SENTINEL NODE BIOPSY Right 05/08/2012   Procedure: RIGHT TOTAL MASTECTOMY WITH AXILLARY SENTINEL NODE BIOPSY;  Surgeon: Haywood Lasso, MD;  Location: [facility];  Service: General;  Laterality: Right;   SIMPLE MASTECTOMY WITH AXILLARY SENTINEL NODE BIOPSY Left 05/08/2012   Procedure: LEFT TOTAL MASTECTOMY;  Surgeon: Haywood Lasso, MD;  Location: [facility];  Service: General;  Laterality: Left;   TONSILLECTOMY AND ADENOIDECTOMY  ~ 2008   TUBAL LIGATION  1976   UVULOPALATOPHARYNGOPLASTY, TONSILLECTOMY AND SEPTOPLASTY  ~ 2008     Current Meds  Medication Sig   acetaminophen (TYLENOL) 500 MG tablet Take 1,000 mg by mouth daily as needed for moderate pain or headache.    aspirin EC 81 MG tablet Take 81 mg by mouth daily. Swallow whole.   Cholecalciferol (VITAMIN D) 2000 units CAPS Take 6,000 Units by mouth at bedtime.    fluticasone (FLONASE) 50 MCG/ACT nasal spray Place 1 spray into both nostrils daily as needed for allergies or rhinitis.   JARDIANCE 25 MG TABS tablet Take 1 tablet (25 mg total) by mouth daily before breakfast.   letrozole (FEMARA) 2.5 MG tablet TAKE (1) TABLET BY MOUTH ONCE DAILY.   lisinopril (ZESTRIL) 40 MG  tablet Take 1 tablet (40 mg total) by mouth daily.   metoprolol succinate (TOPROL-XL) 100 MG 24 hr tablet TAKE 1 TABLET BY MOUTH ONCE A DAY.   Misc Natural Products (TART CHERRY ADVANCED PO) Take by mouth. 1 daily   Multiple Vitamin (MULTIVITAMIN WITH MINERALS) TABS Take 1 tablet by mouth at bedtime.    nitroGLYCERIN (NITROSTAT) 0.4 MG SL tablet Place 0.4 mg under the tongue every 5 (five) minutes as needed for chest pain.   omeprazole (PRILOSEC) 20 MG capsule TAKE ONE CAPSULE BY MOUTH ONCE DAILY.   ondansetron (ZOFRAN ODT) 4 MG disintegrating tablet Take 1 tablet (4 mg total) by mouth every 8 (eight) hours as needed for nausea or vomiting.   rosuvastatin (CRESTOR) 10 MG tablet Take 1 tablet (10 mg total) by mouth daily.   venlafaxine XR (EFFEXOR-XR) 75 MG 24 hr capsule TAKE ONE CAPSULE BY MOUTH TWO TIMES DAILY     Allergies:   Penicillins, Colchicine, and Indomethacin   ROS:   Please see the history of present illness.     All other systems reviewed and are negative.   Labs/Other Tests and Data Reviewed:  Recent Labs: 12/16/2020: TSH 1.220 08/11/2021: ALT 20; BUN 21; Creatinine, Ser 0.93; Hemoglobin 12.8; Platelets 236; Potassium 4.0; Sodium 141   Recent Lipid Panel Lab Results  Component Value Date/Time   CHOL 117 07/20/2021 11:19 AM   TRIG 156 (H) 07/20/2021 11:19 AM   HDL 41 07/20/2021 11:19 AM   CHOLHDL 2.9 07/20/2021 11:19 AM   CHOLHDL 4.3 01/27/2020 10:27 AM   LDLCALC 50 07/20/2021 11:19 AM   LDLCALC 115 (H) 01/27/2020 10:27 AM   LDLDIRECT 71 02/26/2020 12:00 PM    Wt Readings from Last 3 Encounters:  10/03/21 161 lb 12.8 oz (73.4 kg)  08/18/21 155 lb 12.8 oz (70.7 kg)  07/26/21 158 lb 0.6 oz (71.7 kg)     ASSESSMENT & PLAN:    Acute sinusitis Has completed azithromycin without much relief Has been taking OTC decongestant Started moxifloxacin for resistant sinusitis Norel AD as needed for nasal congestion Flonase for allergies  Time:   Today, I have spent  12 minutes reviewing the chart, including problem list, medications, and with the patient with telehealth technology discussing the above problems.   Medication Adjustments/Labs and Tests Ordered: Current medicines are reviewed at length with the patient today.  Concerns regarding medicines are outlined above.   Tests Ordered: No orders of the defined types were placed in this encounter.   Medication Changes: No orders of the defined types were placed in this encounter.    Note: This dictation was prepared with Dragon dictation along with smaller phrase technology. Similar sounding words can be transcribed inadequately or may not be corrected upon review. Any transcriptional errors that result from this process are unintentional.      Disposition:  Follow up  Signed, Lindell Spar, MD  10/20/2021 12:05 PM     [name]

## 2021-10-20 NOTE — Telephone Encounter (Signed)
Would need a visit with patel

## 2021-10-20 NOTE — Patient Instructions (Signed)
Please take Moxifloxacin as prescribed.  Please take Norel AD for nasal congestion.  Please use Flonase for nasal congestion and allergies.  Okay to use humidifier at nighttime.

## 2021-11-08 ENCOUNTER — Telehealth: Payer: Self-pay

## 2021-11-08 NOTE — Telephone Encounter (Signed)
Patient called she is in a donut hole and asked can she get samples of JARDIANCE 25 MG TABS tablet   Contact patient at [phone]

## 2021-11-08 NOTE — Telephone Encounter (Signed)
Pt can come get samples

## 2021-11-09 NOTE — Telephone Encounter (Signed)
Pt's husband came in and picked up a month supply 7 tabs times 4 weeks

## 2021-11-09 NOTE — Telephone Encounter (Signed)
Patient informed. 

## 2021-11-15 ENCOUNTER — Ambulatory Visit (INDEPENDENT_AMBULATORY_CARE_PROVIDER_SITE_OTHER): Payer: PPO | Admitting: Psychiatry

## 2021-11-15 DIAGNOSIS — F3340 Major depressive disorder, recurrent, in remission, unspecified: Secondary | ICD-10-CM

## 2021-11-15 NOTE — Progress Notes (Unsigned)
IN-PERSON  THERAPIST PROGRESS NOTE  Session Time: Tuesday 11/16/18 4: 16 PM -  4:58 PM                                              Participation Level: Active  Behavioral Response: Well GroomedAlertDepressed  Type of Therapy: Individual Therapy  Treatment Goals addressed: Reduce frequency, intensity, and duration of depression symptoms AEB pt reducing episodes of anger,irritability, depressed mood to 1 x per week consistently for 60 days per pt's report / Anetra WILL IDENTIFY 3 COGNITIVE PATTERNS AND BELIEFS THAT SUPPORT DEPRESSION          ProgressTowards Goals: Progressing  Interventions: CBT and Supportive  Summary: Amy Houston is a 76 y.o. female who is referred for services by PCP Dr. Moshe Cipro due to pt experiencing symptoms of depression and transition issues. She denies any psychiatric hospitalizations. Pt and husband participated in couples' counseling several years ago. Pt currently is taking effexor- this is being managed by PCP.  Patient states being stressed to the max due to this chapter in her life.  She says she wants to spend the rest of her life stress-free.  She has a history of breast cancer and this along with her age has caused her to be more in touch with her mortality.  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  However, she reports her most significant stressor is the relationship with her husband.  She reports he talks excessively, does not listen, repeats the same information several times, and constantly tells her things to do.  Patient's current symptoms include anger outburst, irritability, muscle tension, poor concentration, procrastination, and depressed mood.  Patient last was seen about 4 weeks ago. She reports continued improved mood and decreased irritability since last session.  She reports enjoying recent 10-day vacation with her husband.  She reports continued improved communication in their relationship as  well as having more realistic expectations of husband rather than focusing on should and ought thoughts.  She reports continued improvement in her ability to pause before responding.  She states no longer being irritable or angry.  Patient is very pleased with her progress in therapy.  She reports enjoying life and is looking forward to going on a cruise with her daughters and granddaughter the week after Thanksgiving.  She continues to use her spirituality and maintains involvement in activity including working part-time as a Oceanographer.   suicidal/Homicidal: Psychiatric nurse Response: Reviewed symptoms, praised and reinforced patient's continued efforts regarding having more realistic expectations of husband/use of assertive communication,  praised and reinforced patient's efforts to take a pause before responding to husband, the patient identify ways to maintain consistent efforts and positive self-care, developed plan with patient to intentionally plan time for self, provided psychoeducation on lapse versus relapse of depression, assisted patient identify her early warning signs and ways to intervene, reviewed stepdown plan to include 2 more sessions, processed patient's feelings about upcoming termination  Plan: Return again in 2 weeks.  Diagnosis: Recurrent major depressive disorder, in remission [facility])  Collaboration of Care: Psychiatrist AEB patient is scheduled to see psychiatrist Dr. Harrington Challenger in this practice  Patient/Guardian was advised Release of Information must be obtained prior to any record release in order to collaborate their care with an outside provider. Patient/Guardian was advised if they have not  already done so to contact the registration department to sign all necessary forms in order for [place] to release information regarding their care.   Consent: Patient/Guardian gives verbal consent for treatment and assignment of benefits for services provided during this  visit. Patient/Guardian expressed understanding and agreed to proceed.   Alonza Smoker, LCSW 11/15/2021

## 2021-11-21 ENCOUNTER — Other Ambulatory Visit: Payer: Self-pay | Admitting: Nurse Practitioner

## 2021-11-21 ENCOUNTER — Other Ambulatory Visit: Payer: Self-pay | Admitting: Family Medicine

## 2021-11-24 LAB — CBC
Hematocrit: 38.4 % (ref 34.0–46.6)
Hemoglobin: 12.6 g/dL (ref 11.1–15.9)
MCH: 31.1 pg (ref 26.6–33.0)
MCHC: 32.8 g/dL (ref 31.5–35.7)
MCV: 95 fL (ref 79–97)
Platelets: 307 10*3/uL (ref 150–450)
RBC: 4.05 x10E6/uL (ref 3.77–5.28)
RDW: 12.7 % (ref 11.7–15.4)
WBC: 7.2 10*3/uL (ref 3.4–10.8)

## 2021-11-24 LAB — TSH: TSH: 1.29 u[IU]/mL (ref 0.450–4.500)

## 2021-11-24 LAB — HEMOGLOBIN A1C
Est. average glucose Bld gHb Est-mCnc: 157 mg/dL
Hgb A1c MFr Bld: 7.1 % — ABNORMAL HIGH (ref 4.8–5.6)

## 2021-11-24 LAB — CMP14+EGFR
ALT: 15 IU/L (ref 0–32)
AST: 16 IU/L (ref 0–40)
Albumin/Globulin Ratio: 2.3 — ABNORMAL HIGH (ref 1.2–2.2)
Albumin: 4.5 g/dL (ref 3.8–4.8)
Alkaline Phosphatase: 70 IU/L (ref 44–121)
BUN/Creatinine Ratio: 21 (ref 12–28)
BUN: 20 mg/dL (ref 8–27)
Bilirubin Total: 0.4 mg/dL (ref 0.0–1.2)
CO2: 21 mmol/L (ref 20–29)
Calcium: 9.4 mg/dL (ref 8.7–10.3)
Chloride: 106 mmol/L (ref 96–106)
Creatinine, Ser: 0.95 mg/dL (ref 0.57–1.00)
Globulin, Total: 2 g/dL (ref 1.5–4.5)
Glucose: 160 mg/dL — ABNORMAL HIGH (ref 70–99)
Potassium: 4.6 mmol/L (ref 3.5–5.2)
Sodium: 142 mmol/L (ref 134–144)
Total Protein: 6.5 g/dL (ref 6.0–8.5)
eGFR: 62 mL/min/{1.73_m2} (ref >59)

## 2021-11-24 LAB — LIPID PANEL
Chol/HDL Ratio: 2.9 ratio (ref 0.0–4.4)
Cholesterol, Total: 118 mg/dL (ref 100–199)
HDL: 41 mg/dL (ref >39)
LDL Chol Calc (NIH): 54 mg/dL (ref 0–99)
Triglycerides: 128 mg/dL (ref 0–149)
VLDL Cholesterol Cal: 23 mg/dL (ref 5–40)

## 2021-11-29 ENCOUNTER — Ambulatory Visit: Payer: PPO | Admitting: Family Medicine

## 2021-11-30 NOTE — Progress Notes (Deleted)
Office Visit Note  Patient: Amy Houston             Date of Birth: 1945/02/25           MRN: [id number]             PCP: Fayrene Helper, MD Referring: Fayrene Helper, MD Visit Date: 12/14/2021 Occupation: _0 @  Subjective:  No chief complaint on file.   History of Present Illness: Amy Houston is a 76 y.o. female ***   Activities of Daily Living:  Patient reports morning stiffness for *** {minute/hour:19697}.   Patient {ACTIONS;DENIES/REPORTS:21021675::"Denies"} nocturnal pain.  Difficulty dressing/grooming: {ACTIONS;DENIES/REPORTS:21021675::"Denies"} Difficulty climbing stairs: {ACTIONS;DENIES/REPORTS:21021675::"Denies"} Difficulty getting out of chair: {ACTIONS;DENIES/REPORTS:21021675::"Denies"} Difficulty using hands for taps, buttons, cutlery, and/or writing: {ACTIONS;DENIES/REPORTS:21021675::"Denies"}  No Rheumatology ROS completed.   PMFS History:  Patient Active Problem List   Diagnosis Date Noted   Coronary artery ectasia ([place]) 10/03/2021   MCI (mild cognitive impairment) 07/26/2021   FH: dementia 07/26/2021   Annual visit for general adult medical examination without abnormal findings 05/22/2021   Anxiety 05/22/2021   Positive PPD, treated 05/20/2021   Pain of right great toe 05/09/2021   Acute gout 05/06/2021   Gout 04/21/2021   Blood in the stool 12/16/2020   Long-term current use of bisphosphonate 08/12/2020   Dyspnea on exertion    Angina pectoris ([place]) 03/15/2020   Class 1 obesity with serious comorbidity and body mass index (BMI) of 33.0 to 33.9 in adult 09/04/2019   Family history of thyroid cancer 09/04/2019   Hypercholesteremia 09/04/2019   Vitamin D deficiency disease 07/24/2019   Primary hypertension 01/08/2019   Type 2 diabetes mellitus with vascular disease ([place]) 01/08/2019   Diverticulitis of colon 03/08/2015   Acquired trigger finger 05/06/2013   Abnormal genetic test 04/09/2013   Osteopenia 12/11/2012   Thyroid  nodule 06/21/2012   Gastroesophageal reflux disease without esophagitis 06/03/2012   Adenomatous polyps 06/03/2012   Breast cancer, right breast, IDC, Multifocal 03/12/2012   Popliteal cyst 08/18/2010   Hx Left breast cancer, UOQ, IDC, receptor negative 11/08/1993    Past Medical History:  Diagnosis Date   Abnormal genetic test 04/09/2013   PALB2 and CHEK2 positivity    Arthritis    "of the spine" (05/08/2012)   Breast cancer ([place]) 1995   "left" (05/08/2012)   Breast cancer, right breast, IDC, Multifocal 03/12/2012   Multifocal, 10 and 8 left breast    Depression, major, single episode, severe ([place]) 04/21/2021   Diabetes mellitus 1999   "dx; never on meds" (05/08/2012)   Diabetes mellitus without complication ([place]) 93/71/6967   Essential hypertension, benign 01/08/2019   Exertional shortness of breath    Family history of thyroid cancer    GERD (gastroesophageal reflux disease)    tums   H/O hiatal hernia    Hemorrhoid    History of blood transfusion    post child birth   Hyperlipidemia    IBS (irritable bowel syndrome)    Osteopenia 12/11/2012   On Ca++ and Vit D. Bone density in April 2014.  Next bone density due in April 2016.   PONV (postoperative nausea and vomiting)    PPD positive, treated 1999    Family History  Problem Relation Age of Onset   Lung cancer Mother 63       smoker   Depression Father    Leukemia Father 61       Hairy Cell at 49, CLL at 92   Lymphoma Father 13  Hodgkins lymphoma   Lung cancer Sister 70   Thyroid cancer Brother 21       Medullary   Lymphoma Brother 60       Follicular lymphoma   Breast cancer Maternal Aunt        mother's paternal half sister   Colon cancer Maternal Aunt        mother's maternal half sister   Lung cancer Maternal Uncle        mother's paternal half brother   Cancer Paternal Uncle        oral cancer dx in his 17s   Lung cancer Maternal Grandmother        smoker   Lung cancer Cousin        paternal cousin    Breast cancer Cousin        paternal cousin died in her 13s   Cancer Daughter        thyroid   Other Daughter        leiomyoma of esophagus   Thyroid cancer Daughter    Cancer Daughter        breast   Breast cancer Daughter    Past Surgical History:  Procedure Laterality Date   ABDOMINAL HYSTERECTOMY Bilateral 01/30/2013   Procedure: HYSTERECTOMY ABDOMINAL;  Surgeon: Melina Schools, MD;  Location: [facility];  Service: Gynecology;  Laterality: Bilateral;   BREAST BIOPSY Left 1995   BREAST BIOPSY Bilateral 03/2012   "one on the left; 2 on the right" (05/08/2012)   [place]   COLONOSCOPY  12/06/93   Dr. Hayes:single diminutive polyp of the descending colon/extrernal hemorrhoids, benign path   COLONOSCOPY  11/30/98   Dr Hayes:small rectal polyp/internal hemorrhoids, benign path   COLONOSCOPY  11/26/2006   Dr. Hayes:sigmoid polyp/small interal hemorrhoids, adenomatous   COLONOSCOPY N/A 03/30/2016   Dr. Oneida Alar: normal TI, 4 mm rectal polyp, multiple small and large-mouthed diverticula in rectosigmoid and sigmoid colon, non-bleeding internal hemorrhoids, tubular adenoma on path. surveillance in 5 yeras   COLONOSCOPY WITH ESOPHAGOGASTRODUODENOSCOPY (EGD) N/A 07/01/2012   Dr. Oneida Alar: 2 small adenomas, sigmoid colon diverticula, surveillance in 2019   [place]  07/01/2012   Dr. Oneida Alar: proximal esophageal web, Schatzki's ring at [employer] junction, s/p dilaiton. Mild non-erosive gastritis. Repeat EGD July 23, 2012.    ESOPHAGOGASTRODUODENOSCOPY  1994   Dr. Cristina Gong: small hiatal hernia and Schatzki's ring widely patent, CLO test negative   ESOPHAGOGASTRODUODENOSCOPY (EGD) WITH ESOPHAGEAL DILATION N/A 07/23/2012   Dr. Fields:proximal esophageal web v. radiation induced stricture/schatzki ring/small HH, s/p dilation   LEFT HEART CATH AND CORONARY ANGIOGRAPHY N/A 03/16/2020   Procedure: LEFT HEART CATH AND CORONARY  ANGIOGRAPHY;  Surgeon: Adrian Prows, MD;  Location: [facility];  Service: Cardiovascular;  Laterality: N/A;   MASTECTOMY COMPLETE / SIMPLE Left 05/08/2012   MASTECTOMY COMPLETE / SIMPLE W/ SENTINEL NODE BIOPSY Right 05/08/2012   SALPINGOOPHORECTOMY Bilateral 01/30/2013   Procedure: SALPINGO OOPHORECTOMY;  Surgeon: Melina Schools, MD;  Location: [facility];  Service: Gynecology;  Laterality: Bilateral;   SIMPLE MASTECTOMY WITH AXILLARY SENTINEL NODE BIOPSY Right 05/08/2012   Procedure: RIGHT TOTAL MASTECTOMY WITH AXILLARY SENTINEL NODE BIOPSY;  Surgeon: Haywood Lasso, MD;  Location: [facility];  Service: General;  Laterality: Right;   SIMPLE MASTECTOMY WITH AXILLARY SENTINEL NODE BIOPSY Left 05/08/2012   Procedure: LEFT TOTAL MASTECTOMY;  Surgeon: Haywood Lasso, MD;  Location: [facility];  Service: General;  Laterality: Left;   TONSILLECTOMY AND ADENOIDECTOMY  ~ 2008   TUBAL LIGATION  1976   UVULOPALATOPHARYNGOPLASTY, TONSILLECTOMY AND SEPTOPLASTY  ~ 2008   Social History   Social History Narrative   Married 45 yrs.Lives with her husband.Retired.   Immunization History  Administered Date(s) Administered   Fluad Quad(high Dose 65+) 10/01/2019, 12/16/2020   Influenza Split 10/16/2012   Influenza, High Dose Seasonal PF 10/12/2018   Influenza-Unspecified 10/16/2013, 10/02/2014   PFIZER(Purple Top)SARS-COV-2 Vaccination 01/23/2019, 02/14/2019, 11/13/2019   Pneumococcal Conjugate-13 01/08/2019   Pneumococcal Polysaccharide-23 04/29/2020   Tdap 11/21/2013     Objective: Vital Signs: There were no vitals taken for this visit.   Physical Exam   Musculoskeletal Exam: ***  CDAI Exam: CDAI Score: -- Patient Global: --; Provider Global: -- Swollen: --; Tender: -- Joint Exam 12/14/2021   No joint exam has been documented for this visit   There is currently no information documented on the homunculus. Go to the Rheumatology activity and complete the homunculus joint exam.  Investigation: No  additional findings.  Imaging: No results found.  Recent Labs: Lab Results  Component Value Date   WBC 7.2 11/23/2021   HGB 12.6 11/23/2021   PLT 307 11/23/2021   NA 142 11/23/2021   K 4.6 11/23/2021   CL 106 11/23/2021   CO2 21 11/23/2021   GLUCOSE 160 (H) 11/23/2021   BUN 20 11/23/2021   CREATININE 0.95 11/23/2021   BILITOT 0.4 11/23/2021   ALKPHOS 70 11/23/2021   AST 16 11/23/2021   ALT 15 11/23/2021   PROT 6.5 11/23/2021   ALBUMIN 4.5 11/23/2021   CALCIUM 9.4 11/23/2021   GFRAA 75 04/29/2020   QFTBGOLDPLUS Negative 05/20/2021    Speciality Comments: No specialty comments available.  Procedures:  No procedures performed Allergies: Penicillins, Colchicine, and Indomethacin   Assessment / Plan:     Visit Diagnoses: Great toe pain, right - Recurrent pain and swelling. uric acid 4.2 on 05/06/21.  Acquired trigger finger  Osteopenia of multiple sites  Long-term current use of bisphosphonate  Vitamin D deficiency disease  Positive PPD, treated  Hx Left breast cancer, UOQ, IDC, receptor negative  Gastroesophageal reflux disease without esophagitis  Primary hypertension  Hypercholesteremia  Malignant neoplasm of central portion of right breast in female, estrogen receptor positive ([place])  Type 2 diabetes mellitus with vascular disease ([place])  Thyroid nodule  Adenomatous polyps  Anxiety  Diverticulitis of colon  Coronary artery ectasia ([facility])  Family history of thyroid cancer  FH: dementia  Orders: No orders of the defined types were placed in this encounter.  No orders of the defined types were placed in this encounter.   Face-to-face time spent with patient was *** minutes. Greater than 50% of time was spent in counseling and coordination of care.  Follow-Up Instructions: No follow-ups on file.   Ofilia Neas, PA-C  Note - This record has been created using Dragon software.  Chart creation errors have been sought, but may not always   have been located. Such creation errors do not reflect on  the standard of medical care.,

## 2021-12-05 ENCOUNTER — Ambulatory Visit (HOSPITAL_COMMUNITY): Payer: PPO | Admitting: Psychiatry

## 2021-12-06 ENCOUNTER — Ambulatory Visit (INDEPENDENT_AMBULATORY_CARE_PROVIDER_SITE_OTHER): Payer: PPO | Admitting: Family Medicine

## 2021-12-06 ENCOUNTER — Encounter: Payer: Self-pay | Admitting: Family Medicine

## 2021-12-06 VITALS — BP 135/81 | HR 72 | Ht <= 58 in | Wt 158.0 lb

## 2021-12-06 DIAGNOSIS — Z23 Encounter for immunization: Secondary | ICD-10-CM

## 2021-12-06 DIAGNOSIS — I10 Essential (primary) hypertension: Secondary | ICD-10-CM

## 2021-12-06 DIAGNOSIS — E7800 Pure hypercholesterolemia, unspecified: Secondary | ICD-10-CM

## 2021-12-06 DIAGNOSIS — Z6833 Body mass index (BMI) 33.0-33.9, adult: Secondary | ICD-10-CM

## 2021-12-06 DIAGNOSIS — F419 Anxiety disorder, unspecified: Secondary | ICD-10-CM | POA: Diagnosis not present

## 2021-12-06 DIAGNOSIS — E1159 Type 2 diabetes mellitus with other circulatory complications: Secondary | ICD-10-CM

## 2021-12-06 DIAGNOSIS — E6609 Other obesity due to excess calories: Secondary | ICD-10-CM

## 2021-12-06 NOTE — Patient Instructions (Signed)
Follow-up second week in March, call if you need me sooner.  Non fasting hBA1C, chem 7 and eGFR 3 to 5 days before next appt, looking for improved blood sugar!  Nurse please get jardiance for pt  if able, as  she is in the donut hole, requesting samples  Flu vaccine in office today.  Please get COVID vaccine and RSV vaccines at your pharmacy.  Please get Shingrix vaccines at your pharmacy.    Please commit to exercise  3 days/week 20 to 30 minutes each session, this will improve blood sugar along  with mental and physical health and help to protect your heart.  Thankful that you feel much better and doing well, call if you need to have concerns.  Best for the Season and 2024. Happy Be;ated Birthday!! 

## 2021-12-08 ENCOUNTER — Encounter: Payer: Self-pay | Admitting: Family Medicine

## 2021-12-08 NOTE — Assessment & Plan Note (Signed)
Ms. Jamroz is reminded of the importance of commitment to daily physical activity for 30 minutes or more, as able and the need to limit carbohydrate intake to 30 to 60 grams per meal to help with blood sugar control.   The need to take medication as prescribed, test blood sugar as directed, and to call between visits if there is a concern that blood sugar is uncontrolled is also discussed.   Ms. Ronning is reminded of the importance of daily foot exam, annual eye examination, and good blood sugar, blood pressure and cholesterol control.     Latest Ref Rng & Units 11/23/2021    9:20 AM 08/11/2021   10:41 AM 07/20/2021   11:19 AM 05/16/2021    9:38 AM 04/20/2021   10:15 AM  Diabetic Labs  HbA1c 4.8 - 5.6 % 7.1   6.9   7.5   Micro/Creat Ratio 0 - 29 mg/g creat    43    Chol 100 - 199 mg/dL 118   117   170   HDL >39 mg/dL 41   41   41   Calc LDL 0 - 99 mg/dL 54   50   103   Triglycerides 0 - 149 mg/dL 128   156   144   Creatinine 0.57 - 1.00 mg/dL 0.95  0.93  0.86  0.96  0.99       12/06/2021    1:10 PM 10/03/2021   10:30 AM 08/18/2021   11:51 AM 07/26/2021   10:17 AM 05/20/2021    9:26 AM 04/21/2021   10:11 AM 04/21/2021   10:04 AM  BP/Weight  Systolic BP 537 482 707 867 544 920 100  Diastolic BP 81 50 60 49 72 68 64  Wt. (Lbs) 158 161.8 155.8 158.04 158.12  155  BMI 33.02 kg/m2 33.82 kg/m2 32.56 kg/m2 33.03 kg/m2 33.05 kg/m2  32.4 kg/m2      Latest Ref Rng & Units 04/21/2021   10:00 AM 03/01/2021   12:00 AM  Foot/eye exam completion dates  Eye Exam No Retinopathy  No Retinopathy      Foot Form Completion  Done      This result is from an external source.      Needs to improve control, no med change

## 2021-12-08 NOTE — Assessment & Plan Note (Signed)
Hyperlipidemia:Low fat diet discussed and encouraged.   Lipid Panel  Lab Results  Component Value Date   CHOL 118 11/23/2021   HDL 41 11/23/2021   LDLCALC 54 11/23/2021   LDLDIRECT 71 02/26/2020   TRIG 128 11/23/2021   CHOLHDL 2.9 11/23/2021     Controlled, no change in medication

## 2021-12-08 NOTE — Assessment & Plan Note (Signed)
  Patient re-educated about  the importance of commitment to a  minimum of 150 minutes of exercise per week as able.  The importance of healthy food choices with portion control discussed, as well as eating regularly and within a 12 hour window most days. The need to choose "clean , green" food 50 to 75% of the time is discussed, as well as to make water the primary drink and set a goal of 64 ounces water daily.       12/06/2021    1:10 PM 10/03/2021   10:30 AM 08/18/2021   11:51 AM  Weight /BMI  Weight 158 lb 161 lb 12.8 oz 155 lb 12.8 oz  Height 4\' 10"  (1.473 m) 4\' 10"  (1.473 m) 4\' 10"  (1.473 m)  BMI 33.02 kg/m2 33.82 kg/m2 32.56 kg/m2

## 2021-12-08 NOTE — Assessment & Plan Note (Signed)
Controlled, no change in medication Pt to complete therapy in next 4 weeks and has benefited

## 2021-12-08 NOTE — Assessment & Plan Note (Signed)
Controlled, no change in medication DASH diet and commitment to daily physical activity for a minimum of 30 minutes discussed and encouraged, as a part of hypertension management. The importance of attaining a healthy weight is also discussed.     12/06/2021    1:10 PM 10/03/2021   10:30 AM 08/18/2021   11:51 AM 07/26/2021   10:17 AM 05/20/2021    9:26 AM 04/21/2021   10:11 AM 04/21/2021   10:04 AM  BP/Weight  Systolic BP 887 579 728 206 015 615 379  Diastolic BP 81 50 60 49 72 68 64  Wt. (Lbs) 158 161.8 155.8 158.04 158.12  155  BMI 33.02 kg/m2 33.82 kg/m2 32.56 kg/m2 33.03 kg/m2 33.05 kg/m2  32.4 kg/m2

## 2021-12-08 NOTE — Progress Notes (Signed)
Amy Houston     MRN: [id number]      DOB: 26-Nov-1945   HPI Amy Houston is here for follow up and re-evaluation of chronic medical conditions, medication management and review of any available recent lab and radiology data.  Preventive health is updated, specifically  Cancer screening and Immunization.   Questions or concerns regarding consultations or procedures which the PT has had in the interim are  addressed.I discussed that it is my professional opinion that she is not "allergic" to antibiotic prescribed in [place] and explained  the reason Has benefited a lot from therapy which she is finishing soon One visit with Psych, determined , no need to change meds Amy Houston has not made an exercise commitment  ROS Denies recent fever or chills. Denies sinus pressure, nasal congestion, ear pain or sore throat. Denies chest congestion, productive cough or wheezing. Denies chest pains, palpitations and leg swelling Denies abdominal pain, nausea, vomiting,diarrhea or constipation.   Denies dysuria, frequency, hesitancy or incontinence. Denies  uncontrolled joint pain, swelling and limitation in mobility. Denies headaches, seizures, numbness, or tingling. Denies uncontrolled  depression, anxiety or insomnia. Denies skin break down or rash.   PE  BP 135/81 (BP Location: Right Arm, Patient Position: Sitting, Cuff Size: Large)   Pulse 72   Ht 4\' 10"  (1.473 m)   Wt 158 lb (71.7 kg)   SpO2 96%   BMI 33.02 kg/m   Patient alert and oriented and in no cardiopulmonary distress.  HEENT: No facial asymmetry, EOMI,     Neck supple .  Chest: Clear to auscultation bilaterally.  CVS: S1, S2 no murmurs, no S3.Regular rate.  ABD: Soft non tender.   Ext: No edema  MS: Adequate though reduced  ROM spine, shoulders, hips and knees.  Skin: Intact, no ulcerations or rash noted.  Psych: Good eye contact, normal affect. Memory intact not anxious or depressed appearing.  CNS: CN 2-12 intact,  power,  normal throughout.no focal deficits noted.   Assessment & Plan  Primary hypertension Controlled, no change in medication DASH diet and commitment to daily physical activity for a minimum of 30 minutes discussed and encouraged, as a part of hypertension management. The importance of attaining a healthy weight is also discussed.     12/06/2021    1:10 PM 10/03/2021   10:30 AM 08/18/2021   11:51 AM 07/26/2021   10:17 AM 05/20/2021    9:26 AM 04/21/2021   10:11 AM 04/21/2021   10:04 AM  BP/Weight  Systolic BP 202 542 706 237 628 315 176  Diastolic BP 81 50 60 49 72 68 64  Wt. (Lbs) 158 161.8 155.8 158.04 158.12  155  BMI 33.02 kg/m2 33.82 kg/m2 32.56 kg/m2 33.03 kg/m2 33.05 kg/m2  32.4 kg/m2       Type 2 diabetes mellitus with vascular disease ([place]) Amy Houston is reminded of the importance of commitment to daily physical activity for 30 minutes or more, as able and the need to limit carbohydrate intake to 30 to 60 grams per meal to help with blood sugar control.   The need to take medication as prescribed, test blood sugar as directed, and to call between visits if there is a concern that blood sugar is uncontrolled is also discussed.   Amy Houston is reminded of the importance of daily foot exam, annual eye examination, and good blood sugar, blood pressure and cholesterol control.     Latest Ref Rng & Units 11/23/2021    9:20  AM 08/11/2021   10:41 AM 07/20/2021   11:19 AM 05/16/2021    9:38 AM 04/20/2021   10:15 AM  Diabetic Labs  HbA1c 4.8 - 5.6 % 7.1   6.9   7.5   Micro/Creat Ratio 0 - 29 mg/g creat    43    Chol 100 - 199 mg/dL 118   117   170   HDL >39 mg/dL 41   41   41   Calc LDL 0 - 99 mg/dL 54   50   103   Triglycerides 0 - 149 mg/dL 128   156   144   Creatinine 0.57 - 1.00 mg/dL 0.95  0.93  0.86  0.96  0.99       12/06/2021    1:10 PM 10/03/2021   10:30 AM 08/18/2021   11:51 AM 07/26/2021   10:17 AM 05/20/2021    9:26 AM 04/21/2021   10:11 AM 04/21/2021   10:04  AM  BP/Weight  Systolic BP 370 964 383 818 403 754 360  Diastolic BP 81 50 60 49 72 68 64  Wt. (Lbs) 158 161.8 155.8 158.04 158.12  155  BMI 33.02 kg/m2 33.82 kg/m2 32.56 kg/m2 33.03 kg/m2 33.05 kg/m2  32.4 kg/m2      Latest Ref Rng & Units 04/21/2021   10:00 AM 03/01/2021   12:00 AM  Foot/eye exam completion dates  Eye Exam No Retinopathy  No Retinopathy      Foot Form Completion  Done      This result is from an external source.      Needs to improve control, no med change  Anxiety Controlled, no change in medication Pt to complete therapy in next 4 weeks and has benefited  Hypercholesteremia Hyperlipidemia:Low fat diet discussed and encouraged.   Lipid Panel  Lab Results  Component Value Date   CHOL 118 11/23/2021   HDL 41 11/23/2021   LDLCALC 54 11/23/2021   LDLDIRECT 71 02/26/2020   TRIG 128 11/23/2021   CHOLHDL 2.9 11/23/2021     Controlled, no change in medication   Class 1 obesity with serious comorbidity and body mass index (BMI) of 33.0 to 33.9 in adult  Patient re-educated about  the importance of commitment to a  minimum of 150 minutes of exercise per week as able.  The importance of healthy food choices with portion control discussed, as well as eating regularly and within a 12 hour window most days. The need to choose "clean , green" food 50 to 75% of the time is discussed, as well as to make water the primary drink and set a goal of 64 ounces water daily.       12/06/2021    1:10 PM 10/03/2021   10:30 AM 08/18/2021   11:51 AM  Weight /BMI  Weight 158 lb 161 lb 12.8 oz 155 lb 12.8 oz  Height 4\' 10"  (1.473 m) 4\' 10"  (1.473 m) 4\' 10"  (1.473 m)  BMI 33.02 kg/m2 33.82 kg/m2 32.56 kg/m2

## 2021-12-14 ENCOUNTER — Ambulatory Visit: Payer: PPO | Admitting: Rheumatology

## 2021-12-14 DIAGNOSIS — Z808 Family history of malignant neoplasm of other organs or systems: Secondary | ICD-10-CM

## 2021-12-14 DIAGNOSIS — K219 Gastro-esophageal reflux disease without esophagitis: Secondary | ICD-10-CM

## 2021-12-14 DIAGNOSIS — E041 Nontoxic single thyroid nodule: Secondary | ICD-10-CM

## 2021-12-14 DIAGNOSIS — F419 Anxiety disorder, unspecified: Secondary | ICD-10-CM

## 2021-12-14 DIAGNOSIS — Z818 Family history of other mental and behavioral disorders: Secondary | ICD-10-CM

## 2021-12-14 DIAGNOSIS — K5732 Diverticulitis of large intestine without perforation or abscess without bleeding: Secondary | ICD-10-CM

## 2021-12-14 DIAGNOSIS — E1159 Type 2 diabetes mellitus with other circulatory complications: Secondary | ICD-10-CM

## 2021-12-14 DIAGNOSIS — I7789 Other specified disorders of arteries and arterioles: Secondary | ICD-10-CM

## 2021-12-14 DIAGNOSIS — M8589 Other specified disorders of bone density and structure, multiple sites: Secondary | ICD-10-CM

## 2021-12-14 DIAGNOSIS — Z853 Personal history of malignant neoplasm of breast: Secondary | ICD-10-CM

## 2021-12-14 DIAGNOSIS — M6530 Trigger finger, unspecified finger: Secondary | ICD-10-CM

## 2021-12-14 DIAGNOSIS — E559 Vitamin D deficiency, unspecified: Secondary | ICD-10-CM

## 2021-12-14 DIAGNOSIS — C50111 Malignant neoplasm of central portion of right female breast: Secondary | ICD-10-CM

## 2021-12-14 DIAGNOSIS — D369 Benign neoplasm, unspecified site: Secondary | ICD-10-CM

## 2021-12-14 DIAGNOSIS — I10 Essential (primary) hypertension: Secondary | ICD-10-CM

## 2021-12-14 DIAGNOSIS — Z7983 Long term (current) use of bisphosphonates: Secondary | ICD-10-CM

## 2021-12-14 DIAGNOSIS — M79674 Pain in right toe(s): Secondary | ICD-10-CM

## 2021-12-14 DIAGNOSIS — R7611 Nonspecific reaction to tuberculin skin test without active tuberculosis: Secondary | ICD-10-CM

## 2021-12-14 DIAGNOSIS — E7800 Pure hypercholesterolemia, unspecified: Secondary | ICD-10-CM

## 2021-12-20 ENCOUNTER — Ambulatory Visit (INDEPENDENT_AMBULATORY_CARE_PROVIDER_SITE_OTHER): Payer: PPO | Admitting: Psychiatry

## 2021-12-20 DIAGNOSIS — F3340 Major depressive disorder, recurrent, in remission, unspecified: Secondary | ICD-10-CM

## 2021-12-20 NOTE — Progress Notes (Signed)
IN-PERSON  THERAPIST PROGRESS NOTE  Session Time: Tuesday 12/21/18 4:11 PM - 4:55 PM                                                Participation Level: Active  Behavioral Response: Well GroomedAlertDepressed  Type of Therapy: Individual Therapy  Treatment Goals addressed: Reduce frequency, intensity, and duration of depression symptoms AEB pt reducing episodes of anger,irritability, depressed mood to 1 x per week consistently for 60 days per pt's report / [place] WILL IDENTIFY 3 COGNITIVE PATTERNS AND BELIEFS THAT SUPPORT DEPRESSION          ProgressTowards Goals: Progressing  Interventions: CBT and Supportive  Summary: Amy Houston is a 76 y.o. female who is referred for services by PCP Dr. Moshe Cipro due to pt experiencing symptoms of depression and transition issues. She denies any psychiatric hospitalizations. Pt and husband participated in couples' counseling several years ago. Pt currently is taking effexor- this is being managed by PCP.  Patient states being stressed to the max due to this chapter in her life.  She says she wants to spend the rest of her life stress-free.  She has a history of breast cancer and this along with her age has caused her to be more in touch with her mortality.  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  However, she reports her most significant stressor is the relationship with her husband.  She reports he talks excessively, does not listen, repeats the same information several times, and constantly tells her things to do.  Patient's current symptoms include anger outburst, irritability, muscle tension, poor concentration, procrastination, and depressed mood.  Patient last was seen about 4 weeks ago. She states being okay since last session.  She enjoyed going on a recent cruise with her daughters and granddaughter.  She reports increased stress and irritability within the past few days as she had conflict with her  husband.  She expresses hurt and anger regarding a negative comment from husband.  She reports she has not used assertiveness skills to express her concerns to her husband.  Patient is maintaining involvement in activity and continues to enjoy her part-time job.  She continues to attend church and socializes with family and friends.    suicidal/Homicidal: Nowithout intent/plan  Therapist Response: Reviewed symptoms, as and reinforced patient's continued involvement in activity, discussed stressors, facilitated expression of thoughts and feelings, validated feelings, assisted patient examine her recent interaction with husband, assisted patient identify alternative ways using assertiveness skills particularly I messages to address recent conflict, also discussed having realistic expectations, discussed rationale for and introduced mindfulness and an understanding of the window of tolerance as ways to avoid relapse/to assist patient to respond rather than to react, discussed stepdown plan to termination to include 2 more sessions focusing on relapse prevention strategies.  Plan: Return again in 2 weeks.  Diagnosis: Recurrent major depressive disorder, in remission [facility])  Collaboration of Care: Psychiatrist AEB patient is scheduled to see psychiatrist Dr. Harrington Challenger in this practice  Patient/Guardian was advised Release of Information must be obtained prior to any record release in order to collaborate their care with an outside provider. Patient/Guardian was advised if they have not already done so to contact the registration department to sign all necessary forms in order for [place] to release information regarding their  care.   Consent: Patient/Guardian gives verbal consent for treatment and assignment of benefits for services provided during this visit. Patient/Guardian expressed understanding and agreed to proceed.   Alonza Smoker, LCSW 12/20/2021

## 2021-12-22 ENCOUNTER — Other Ambulatory Visit: Payer: Self-pay | Admitting: Family Medicine

## 2021-12-26 ENCOUNTER — Other Ambulatory Visit: Payer: Self-pay

## 2021-12-26 ENCOUNTER — Emergency Department (HOSPITAL_COMMUNITY)
Admission: EM | Admit: 2021-12-26 | Discharge: 2021-12-26 | Disposition: A | Discharge status: Home / Self Care | Payer: PPO | Attending: Emergency Medicine | Admitting: Emergency Medicine

## 2021-12-26 DIAGNOSIS — Z7984 Long term (current) use of oral hypoglycemic drugs: Secondary | ICD-10-CM | POA: Diagnosis not present

## 2021-12-26 DIAGNOSIS — W06XXXA Fall from bed, initial encounter: Secondary | ICD-10-CM | POA: Diagnosis not present

## 2021-12-26 DIAGNOSIS — Z7982 Long term (current) use of aspirin: Secondary | ICD-10-CM | POA: Insufficient documentation

## 2021-12-26 DIAGNOSIS — Z853 Personal history of malignant neoplasm of breast: Secondary | ICD-10-CM | POA: Diagnosis not present

## 2021-12-26 DIAGNOSIS — S0101XA Laceration without foreign body of scalp, initial encounter: Secondary | ICD-10-CM

## 2021-12-26 DIAGNOSIS — E119 Type 2 diabetes mellitus without complications: Secondary | ICD-10-CM | POA: Diagnosis not present

## 2021-12-26 DIAGNOSIS — Y92003 Bedroom of unspecified non-institutional (private) residence as the place of occurrence of the external cause: Secondary | ICD-10-CM | POA: Diagnosis not present

## 2021-12-26 DIAGNOSIS — S0990XA Unspecified injury of head, initial encounter: Secondary | ICD-10-CM | POA: Diagnosis present

## 2021-12-26 MED ORDER — LIDOCAINE-EPINEPHRINE (PF) 2 %-1:200000 IJ SOLN
10.0000 mL | Freq: Once | INTRAMUSCULAR | Status: AC
  Administered 2021-12-26: 10 mL
  Filled 2021-12-26: qty 20

## 2021-12-26 NOTE — ED Notes (Signed)
Wound cleaned.

## 2021-12-26 NOTE — Discharge Instructions (Signed)
Staple removal in 7 to 10 days  Return to the ED if you start having severe headache vomiting confusion or other concerning symptoms.

## 2021-12-26 NOTE — ED Triage Notes (Signed)
Pt slid out of bed and hit her head on floor. Laceration to the back of her head about 2.5 cm open. Bleeding under control. Pt ambulated without any issues.

## 2021-12-26 NOTE — ED Provider Notes (Signed)
[facility] EMERGENCY DEPARTMENT Provider Note   CSN: [id number] Arrival date & time: 12/26/21  9767     History  Chief Complaint  Patient presents with   Head Laceration    Amy Houston is a 76 y.o. female.   Head Laceration     Patient has a history of of GERD, IBS, diabetes, breast cancer.  Patient is not on any anticoagulation.  She presents to the ED for evaluation of a head injury.  Patient's alarm went off this morning.  She thinks she was rolling over to try to turn it off when she ended up rolling out of the bed.  Patient struck the back of her head on the floor and sustained laceration that continues to bleed.  She did not lose consciousness.  She is not having a severe headache.  She denies any neck pain.    Home Medications Prior to Admission medications   Medication Sig Start Date End Date Taking? Authorizing Provider  acetaminophen (TYLENOL) 500 MG tablet Take 1,000 mg by mouth daily as needed for moderate pain or headache.     [provider]  aspirin EC 81 MG tablet Take 81 mg by mouth daily. Swallow whole.    [provider]  blood glucose meter kit and supplies Dispense based on patient and insurance preference. Once daily testing DX E11.9 Patient not taking: Reported on 10/20/2021 05/06/21   Fayrene Helper, MD  Chlorphen-PE-Acetaminophen (NOREL AD) 4-10-325 MG TABS Take 1 tablet by mouth 3 (three) times daily as needed (Nasal congestion). 10/20/21   Lindell Spar, MD  Cholecalciferol (VITAMIN D) 2000 units CAPS Take 6,000 Units by mouth at bedtime.     [provider]  fluticasone (FLONASE) 50 MCG/ACT nasal spray Place 1 spray into both nostrils daily as needed for allergies or rhinitis. 10/20/21   Lindell Spar, MD  glucose blood test strip Use as instructed daily dx e11.9 Patient not taking: Reported on 10/20/2021 05/06/21   Fayrene Helper, MD  JARDIANCE 25 MG TABS tablet Take 1 tablet (25 mg total) by mouth daily  before breakfast. 09/23/21   Fayrene Helper, MD  Lancets 30G [place] Once daily testing dx e11.9 Patient not taking: Reported on 10/20/2021 05/06/21   Fayrene Helper, MD  letrozole [facility]) 2.5 MG tablet TAKE (1) TABLET BY MOUTH ONCE DAILY. 03/31/21   Derek Jack, MD  lisinopril (ZESTRIL) 40 MG tablet Take 1 tablet (40 mg total) by mouth daily. 04/21/21   Renee Rival, FNP  metoprolol succinate (TOPROL-XL) 100 MG 24 hr tablet TAKE 1 TABLET BY MOUTH ONCE A DAY. 02/17/21   Adrian Prows, MD  Misc Natural Products (TART CHERRY ADVANCED PO) Take by mouth. 1 daily    [provider]  montelukast (SINGULAIR) 10 MG tablet Take 10 mg by mouth at bedtime. 10/30/21   [provider]  Multiple Vitamin (MULTIVITAMIN WITH MINERALS) TABS Take 1 tablet by mouth at bedtime.     [provider]  nitroGLYCERIN (NITROSTAT) 0.4 MG SL tablet Place 0.4 mg under the tongue every 5 (five) minutes as needed for chest pain.    [provider]  omeprazole (PRILOSEC) 20 MG capsule TAKE ONE CAPSULE BY MOUTH ONCE DAILY. 06/01/21   Annitta Needs, NP  ondansetron (ZOFRAN ODT) 4 MG disintegrating tablet Take 1 tablet (4 mg total) by mouth every 8 (eight) hours as needed for nausea or vomiting. 02/14/21   [employer], Dewaine Conger, FNP  rosuvastatin (CRESTOR)  10 MG tablet Take 1 tablet (10 mg total) by mouth daily. 11/21/21   Fayrene Helper, MD  venlafaxine XR (EFFEXOR-XR) 75 MG 24 hr capsule TAKE ONE CAPSULE BY MOUTH TWO TIMES DAILY 12/22/21   Fayrene Helper, MD      Allergies    Moxifloxacin hcl, Penicillins, Colchicine, and Indomethacin    Review of Systems   Review of Systems  Physical Exam Updated Vital Signs BP (!) 142/78   Pulse 73   Temp 97.7 F (36.5 C) (Oral)   Resp 16   Ht 1.473 m (_0 )   Wt 69.4 kg   SpO2 98%   BMI 31.98 kg/m  Physical Exam Vitals and nursing note reviewed.  Constitutional:      General: She is not in acute distress.     Appearance: She is well-developed.  HENT:     Head: Normocephalic.     Comments: Approximately 2 cm laceration posterior occiput    Right Ear: External ear normal.     Left Ear: External ear normal.  Eyes:     General: No scleral icterus.       Right eye: No discharge.        Left eye: No discharge.     Conjunctiva/sclera: Conjunctivae normal.  Neck:     Trachea: No tracheal deviation.  Cardiovascular:     Rate and Rhythm: Normal rate.  Pulmonary:     Effort: Pulmonary effort is normal. No respiratory distress.     Breath sounds: No stridor.  Abdominal:     General: There is no distension.  Musculoskeletal:        General: No swelling or deformity.     Cervical back: Neck supple.     Comments: No spinal tenderness  Skin:    General: Skin is warm and dry.     Findings: No rash.  Neurological:     Mental Status: She is alert. Mental status is at baseline.     Cranial Nerves: No dysarthria or facial asymmetry.     Motor: No seizure activity.     ED Results / Procedures / Treatments   Labs (all labs ordered are listed, but only abnormal results are displayed) Labs Reviewed - No data to display  EKG None  Radiology No results found.  Procedures .Marland KitchenLaceration Repair  Date/Time: 12/26/2021 7:39 AM  Performed by: Dorie Rank, MD Authorized by: Dorie Rank, MD   Consent:    Consent obtained:  Verbal   Consent given by:  Patient   Risks, benefits, and alternatives were discussed: yes   Anesthesia:    Anesthesia method:  Local infiltration   Local anesthetic:  Lidocaine 1% WITH epi Laceration details:    Location:  Scalp   Scalp location:  Occipital   Length (cm):  2 Pre-procedure details:    Preparation:  Patient was prepped and draped in usual sterile fashion Exploration:    Wound extent: no underlying fracture and no vascular damage     Contaminated: no   Treatment:    Area cleansed with:  Saline   Amount of cleaning:  Standard Skin repair:    Repair  method:  Staples   Number of staples:  3 Approximation:    Approximation:  Close Repair type:    Repair type:  Simple Post-procedure details:    Dressing:  Open (no dressing)   Procedure completion:  Tolerated well, no immediate complications     Medications Ordered in ED Medications  lidocaine-EPINEPHrine (XYLOCAINE W/EPI)  2 %-1:200000 (PF) injection 10 mL (10 mLs Infiltration Given 12/26/21 [id number])    ED Course/ Medical Decision Making/ A&P                           Medical Decision Making Risk Prescription drug management.   Patient presented to the ED for evaluation of a scalp laceration.  Patient is on anticoagulation.  No loss of consciousness.  No headache nausea vomiting.  No spinal tenderness.  Do not feel that any advanced imaging is necessary.  Discussed warning signs and precautions after head injury.  Patient understands to return if she starts having severe headache vomiting confusion.  Laceration repaired without difficulty.  Evaluation and diagnostic testing in the emergency department does not suggest an emergent condition requiring admission or immediate intervention beyond what has been performed at this time.  The patient is safe for discharge and has been instructed to return immediately for worsening symptoms, change in symptoms or any other concerns.        Final Clinical Impression(s) / ED Diagnoses Final diagnoses:  Laceration of scalp, initial encounter    Rx / DC Orders ED Discharge Orders     None         Dorie Rank, MD 12/26/21 [phone]

## 2021-12-27 ENCOUNTER — Telehealth: Payer: Self-pay

## 2021-12-27 NOTE — Telephone Encounter (Signed)
Transition Care Management Follow-up Telephone Call Date of discharge and from where: 12/26/2021 from [facility] How have you been since you were released from the hospital? Doing Good Any questions or concerns? No  Items Reviewed: Did the pt receive and understand the discharge instructions provided? Yes  Medications obtained and verified? Yes  Other? Yes  Any new allergies since your discharge? Yes  Dietary orders reviewed? Yes Do you have support at home? Yes   Home Care and Equipment/Supplies: Were home health services ordered? not applicable If so, what is the name of the agency?   Has the agency set up a time to come to the patient's home? not applicable Were any new equipment or medical supplies ordered?  No What is the name of the medical supply agency?  Were you able to get the supplies/equipment? not applicable Do you have any questions related to the use of the equipment or supplies? No  Functional Questionnaire: (I = Independent and D = Dependent) ADLs: I  Bathing/Dressing- I  Meal Prep- I  Eating- I  Maintaining continence- I  Transferring/Ambulation- I  Managing Meds- I  Follow up appointments reviewed:  PCP Hospital f/u appt confirmed? Yes  Scheduled to see Dr Moshe Cipro. [facility] f/u appt confirmed? No   . Are transportation arrangements needed? No  If their condition worsens, is the pt aware to call PCP or go to the Emergency Dept.? Yes Was the patient provided with contact information for the PCP's office or ED? Yes Was to pt encouraged to call back with questions or concerns? Yes

## 2022-01-05 ENCOUNTER — Inpatient Hospital Stay: Payer: PPO | Admitting: Family Medicine

## 2022-01-06 ENCOUNTER — Ambulatory Visit: Payer: PPO | Admitting: Rheumatology

## 2022-01-06 ENCOUNTER — Ambulatory Visit (INDEPENDENT_AMBULATORY_CARE_PROVIDER_SITE_OTHER): Payer: PPO | Admitting: Family Medicine

## 2022-01-06 ENCOUNTER — Encounter: Payer: Self-pay | Admitting: Family Medicine

## 2022-01-06 VITALS — BP 128/64 | HR 61 | Ht <= 58 in | Wt 156.0 lb

## 2022-01-06 DIAGNOSIS — I10 Essential (primary) hypertension: Secondary | ICD-10-CM | POA: Diagnosis not present

## 2022-01-06 DIAGNOSIS — S0101XD Laceration without foreign body of scalp, subsequent encounter: Secondary | ICD-10-CM | POA: Diagnosis not present

## 2022-01-06 DIAGNOSIS — E1159 Type 2 diabetes mellitus with other circulatory complications: Secondary | ICD-10-CM

## 2022-01-06 DIAGNOSIS — S2020XD Contusion of thorax, unspecified, subsequent encounter: Secondary | ICD-10-CM

## 2022-01-06 DIAGNOSIS — J309 Allergic rhinitis, unspecified: Secondary | ICD-10-CM

## 2022-01-06 DIAGNOSIS — Z4802 Encounter for removal of sutures: Secondary | ICD-10-CM | POA: Diagnosis not present

## 2022-01-06 NOTE — Patient Instructions (Signed)
Follow-up as before.  Call if you need me sooner.  Please get labs ALREADY ORDERED 2 TO 3 DAYS BEFORE VISIT    Where you had a head injury is clean, no sign of infection.  3 staples are removed I office today with no complication  Left ear exam   shows no infection or wax build up, commit to allergy medications , this will help the fullness  PLease BE CAREFUL AND HOPEFULLY WE WILL HAVE NO MORE FALLS   Thanks for choosing [facility], we consider it a privelige to serve you.

## 2022-01-06 NOTE — Progress Notes (Unsigned)
    Amy Houston     MRN: [id number]      DOB: 1945/08/23   HPI Amy Houston is here for follow up and staple removal from posterior scalp, fell while getting out of bed from a nightmare which she cannot remember on Christmas day, sorta slid to the floor, hitting the back of her head, significant bleeding took her to the ED.  Npo residual headache , momory loss, bleeding from earsor nose Let ear pressure x 2 days    ROS Denies recent fever or chills. Denies sinus pressure, nasal congestion, ear pain or sore throat. Denies chest congestion, productive cough or wheezing. Denies chest pains, palpitations and leg swelling Denies abdominal pain, nausea, vomiting,diarrhea or constipation.   Denies dysuria, frequency, hesitancy or incontinence. . Denies uncontrolled  depression, anxiety or insomnia. Bruise to right upper flank   PE  BP 128/64   Pulse 61   Ht 4\' 10"  (1.473 m)   Wt 156 lb 0.6 oz (70.8 kg)   SpO2 94%   BMI 32.61 kg/m   Patient alert and oriented and in no cardiopulmonary distress.  HEENT: No facial asymmetry, EOMI,     Neck supple .Left and right TM's clear , good light reflex, no cerumen impaction slight build up of fluid behind  left ear drum  Chest: Clear to auscultation bilaterally.  CVS: S1, S2 no murmurs, no S3.Regular rate.  ABD: Soft non tender.   Ext: No edema  MS: Adequate ROM spine, shoulders, hips and knees.  Skin: laceration at occiput clean, dry , no drainage or bleeding , no erythema, 3 staples intact and removed with no complication by nursing Bruise to right upper flank, max diameter approx 6 cm, variation in discoloration, no open skin Psych: Good eye contact, normal affect. Memory intact not anxious or depressed appearing.  CNS: CN 2-12 intact, power,  normal throughout.no focal deficits noted.   Assessment & Plan  Scalp laceration, subsequent encounter Healed well, no sign of infection, 3 staples that had been placed in the ED  removed by nursing, no complications  Primary hypertension Controlled, no change in medication   Type 2 diabetes mellitus with vascular disease (HCC) Controlled, no change in medication   Bruise, trunk, subsequent encounter Right upper flank no skin breakdown noted  Encounter for removal of staples Three staples removed from occiput by  Nursing that had been put in on 12/26/2021 in the ED ,no complications, laceration well healed  Allergic rhinitis Encouraged to commit to daily norel for 3 to 5 days to relieve left ear pressure

## 2022-01-08 DIAGNOSIS — S2020XD Contusion of thorax, unspecified, subsequent encounter: Secondary | ICD-10-CM | POA: Insufficient documentation

## 2022-01-08 DIAGNOSIS — J309 Allergic rhinitis, unspecified: Secondary | ICD-10-CM | POA: Insufficient documentation

## 2022-01-08 DIAGNOSIS — S0101XD Laceration without foreign body of scalp, subsequent encounter: Secondary | ICD-10-CM | POA: Insufficient documentation

## 2022-01-08 DIAGNOSIS — Z4802 Encounter for removal of sutures: Secondary | ICD-10-CM | POA: Insufficient documentation

## 2022-01-08 NOTE — Assessment & Plan Note (Signed)
Controlled, no change in medication  

## 2022-01-08 NOTE — Assessment & Plan Note (Signed)
Three staples removed from occiput by  Nursing that had been put in on 12/26/2021 in the ED ,no complications, laceration well healed

## 2022-01-08 NOTE — Assessment & Plan Note (Signed)
Healed well, no sign of infection, 3 staples that had been placed in the ED removed by nursing, no complications

## 2022-01-08 NOTE — Assessment & Plan Note (Signed)
Right upper flank no skin breakdown noted

## 2022-01-08 NOTE — Assessment & Plan Note (Signed)
Encouraged to commit to daily norel for 3 to 5 days to relieve left ear pressure

## 2022-01-09 ENCOUNTER — Encounter: Payer: PPO | Admitting: Family Medicine

## 2022-01-16 ENCOUNTER — Encounter: Payer: PPO | Admitting: Family Medicine

## 2022-01-31 ENCOUNTER — Ambulatory Visit (INDEPENDENT_AMBULATORY_CARE_PROVIDER_SITE_OTHER): Payer: PPO | Admitting: Psychiatry

## 2022-01-31 DIAGNOSIS — F3340 Major depressive disorder, recurrent, in remission, unspecified: Secondary | ICD-10-CM | POA: Diagnosis not present

## 2022-01-31 NOTE — Progress Notes (Unsigned)
Virtual Visit via Video Note  I connected with Amy Houston on 01/31/22 at 4:22 PM EST  by a video enabled telemedicine application and verified that I am speaking with the correct person using two identifiers.  Location: Patient: Home Provider: Mead Houston    I discussed the limitations of evaluation and management by telemedicine and the availability of in person appointments. The patient expressed understanding and agreed to proceed.   I provided 38 minutes of non-face-to-face time during this encounter.   Amy Smoker, LCSW  IN-PERSON  THERAPIST PROGRESS NOTE  Session Time: Tuesday 01/31/2022 4:22 PM -  5:00 PM                                           Participation Level: Active  Behavioral Response: Well GroomedAlertDepressed  Type of Therapy: Individual Therapy  Treatment Goals addressed: Reduce frequency, intensity, and duration of depression symptoms AEB pt reducing episodes of anger,irritability, depressed mood to 1 x per week consistently for 60 days per pt's report / Amy Houston 3 COGNITIVE PATTERNS AND BELIEFS THAT SUPPORT DEPRESSION          ProgressTowards Goals: Progressing  Interventions: CBT and Supportive  Summary: Amy Houston is a 77 y.o. female who is referred for services by PCP Amy Houston due to pt experiencing symptoms of depression and transition issues. She denies any psychiatric hospitalizations. Pt and husband participated in couples' counseling several years ago. Pt currently is taking effexor- this is being managed by PCP.  Patient states being stressed to the max due to this chapter in her life.  She says she wants to spend the rest of her life stress-free.  She has a history of breast cancer and this along with her age has caused her to be more in touch with her mortality.  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  However, she reports her most significant stressor  is the relationship with her husband.  She reports he talks excessively, does not listen, repeats the same information several times, and constantly tells her things to do.  Patient's current symptoms include anger outburst, irritability, muscle tension, poor concentration, procrastination, and depressed mood.  Patient last was seen about 5-6 weeks ago. She reports doing well and denies any symptoms of depression since last session.  She reports better overall communication with husband and having realistic expectations.  However, she reports a recent incident where she became very frustrated with the conversation with husband and her daughter.  She reports going outside of her window of tolerance but then becoming aware of this and disengaging from the conversation.  Patient reports using grounding skills by going to her room and calling a friend.  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  Patient remains pleased with her progress in therapy and expresses confidence in her abilities to use coping skills.  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   suicidal/Homicidal: Nowithout intent/plan  Therapist Response: Reviewed symptoms, discussed stressors, facilitated expression of thoughts and feelings, validated feelings, praised and reinforced patient's recognition of being outside of the window of tolerance and using grounding techniques, discussed rationale for and assisted patient practice activities to increase mindfulness skills, developed plan with patient to practice a mindfulness activity daily between sessions, processed  patient's feelings about termination at next session, praised and reinforced patient's continued involvement in activity.    .  Plan: Return again in 2 weeks.  Diagnosis: Recurrent major depressive disorder, in remission Amy Houston)  Collaboration of Care: Psychiatrist AEB patient is scheduled to see psychiatrist  Amy Houston in this practice  Patient/Guardian was advised Release of Information must be obtained prior to any record release in order to collaborate their care with an outside provider. Patient/Guardian was advised if they have not already done so to contact the registration department to sign all necessary forms in order for [place] to release information regarding their care.   Consent: Patient/Guardian gives verbal consent for treatment and assignment of benefits for services provided during this visit. Patient/Guardian expressed understanding and agreed to proceed.   Amy Smoker, LCSW 01/31/2022

## 2022-02-02 ENCOUNTER — Other Ambulatory Visit: Payer: Self-pay | Admitting: Family Medicine

## 2022-02-09 ENCOUNTER — Ambulatory Visit (INDEPENDENT_AMBULATORY_CARE_PROVIDER_SITE_OTHER): Payer: PPO | Admitting: Family Medicine

## 2022-02-09 ENCOUNTER — Encounter: Payer: Self-pay | Admitting: Family Medicine

## 2022-02-09 VITALS — BP 129/72 | HR 69 | Ht <= 58 in | Wt 161.0 lb

## 2022-02-09 DIAGNOSIS — Z0000 Encounter for general adult medical examination without abnormal findings: Secondary | ICD-10-CM

## 2022-02-09 NOTE — Patient Instructions (Signed)
  Ms. Swisher , Thank you for taking time to come for your Medicare Wellness Visit. I appreciate your ongoing commitment to your health goals. Please review the following plan we discussed and let me know if I can assist you in the future.   This is a list of the screening recommended for you and due dates:  Health Maintenance  Topic Date Due   Zoster (Shingles) Vaccine (1 of 2) Never done   COVID-19 Vaccine (4 - 2023-24 season) 02/25/2022*   Eye exam for diabetics  03/01/2022   Complete foot exam   04/22/2022   Yearly kidney health urinalysis for diabetes  05/17/2022   Hemoglobin A1C  05/24/2022   Yearly kidney function blood test for diabetes  11/24/2022   Medicare Annual Wellness Visit  02/10/2023   DTaP/Tdap/Td vaccine (2 - Td or Tdap) 11/22/2023   Colon Cancer Screening  03/31/2026   Pneumonia Vaccine  Completed   DEXA scan (bone density measurement)  Completed   Hepatitis C Screening: USPSTF Recommendation to screen - Ages 67-79 yo.  Completed   HPV Vaccine  Aged Out  *Topic was postponed. The date shown is not the original due date.

## 2022-02-09 NOTE — Progress Notes (Signed)
Subjective:   Amy Houston is a 77 y.o. female who presents for Medicare Annual (Subsequent) preventive examination.  Review of Systems    Patient denies pain, blurred vision, chest pain, shortness of breath, abdominal pain, nausea, vomiting. Patient is not feeling nervous or anxious.      Objective:    There were no vitals filed for this visit. There is no height or weight on file to calculate BMI.     12/26/2021    7:16 AM 08/18/2021   11:50 AM 01/06/2021   11:23 AM 08/12/2020   10:24 AM 03/16/2020    9:57 AM 01/15/2020   10:14 AM 07/14/2019   11:41 AM  Advanced Directives  Does Patient Have a Medical Advance Directive? No No Yes Yes Yes No Yes  Type of Comptroller;Living will [place];Living will [place];Living will  [place]  Does patient want to make changes to medical advance directive?    No - Patient declined   No - Patient declined  Copy of [place] in Chart?   No - copy requested No - copy requested   No - copy requested  Would patient like information on creating a medical advance directive? No - Patient declined No - Patient declined No - Patient declined   No - Patient declined     Current Medications (verified) Outpatient Encounter Medications as of 02/09/2022  Medication Sig   acetaminophen (TYLENOL) 500 MG tablet Take 1,000 mg by mouth daily as needed for moderate pain or headache.    aspirin EC 81 MG tablet Take 81 mg by mouth daily. Swallow whole.   blood glucose meter kit and supplies Dispense based on patient and insurance preference. Once daily testing DX E11.9 (Patient not taking: Reported on 01/06/2022)   Chlorphen-PE-Acetaminophen (NOREL AD) 4-10-325 MG TABS Take 1 tablet by mouth 3 (three) times daily as needed (Nasal congestion).   Cholecalciferol (VITAMIN D) 2000 units CAPS Take 6,000 Units by mouth at bedtime.    fluticasone (FLONASE) 50  MCG/ACT nasal spray Place 1 spray into both nostrils daily as needed for allergies or rhinitis.   glucose blood test strip Use as instructed daily dx e11.9 (Patient not taking: Reported on 10/20/2021)   JARDIANCE 25 MG TABS tablet Take 1 tablet (25 mg total) by mouth daily before breakfast.   Lancets 30G MISC Once daily testing dx e11.9 (Patient not taking: Reported on 10/20/2021)   letrozole (FEMARA) 2.5 MG tablet TAKE (1) TABLET BY MOUTH ONCE DAILY.   lisinopril (ZESTRIL) 40 MG tablet Take 1 tablet (40 mg total) by mouth daily.   metoprolol succinate (TOPROL-XL) 100 MG 24 hr tablet TAKE 1 TABLET BY MOUTH ONCE A DAY.   Misc Natural Products (TART CHERRY ADVANCED PO) Take by mouth. 1 daily   montelukast (SINGULAIR) 10 MG tablet Take 10 mg by mouth at bedtime.   Multiple Vitamin (MULTIVITAMIN WITH MINERALS) TABS Take 1 tablet by mouth at bedtime.    nitroGLYCERIN (NITROSTAT) 0.4 MG SL tablet Place 0.4 mg under the tongue every 5 (five) minutes as needed for chest pain.   omeprazole (PRILOSEC) 20 MG capsule TAKE ONE CAPSULE BY MOUTH ONCE DAILY.   ondansetron (ZOFRAN ODT) 4 MG disintegrating tablet Take 1 tablet (4 mg total) by mouth every 8 (eight) hours as needed for nausea or vomiting.   rosuvastatin (CRESTOR) 10 MG tablet Take 1 tablet (10 mg total) by mouth  daily.   venlafaxine XR (EFFEXOR-XR) 75 MG 24 hr capsule TAKE ONE CAPSULE BY MOUTH TWO TIMES DAILY   No facility-administered encounter medications on file as of 02/09/2022.    Allergies (verified) Moxifloxacin hcl, Penicillins, Colchicine, and Indomethacin   History: Past Medical History:  Diagnosis Date   Abnormal genetic test 04/09/2013   PALB2 and CHEK2 positivity    Arthritis    "of the spine" (05/08/2012)   Breast cancer ([place]) 1995   "left" (05/08/2012)   Breast cancer, right breast, IDC, Multifocal 03/12/2012   Multifocal, 10 and 8 left breast    Depression, major, single episode, severe ([place]) 04/21/2021   Diabetes mellitus  1999   "dx; never on meds" (05/08/2012)   Diabetes mellitus without complication ([place]) 78/93/8101   Essential hypertension, benign 01/08/2019   Exertional shortness of breath    Family history of thyroid cancer    GERD (gastroesophageal reflux disease)    tums   H/O hiatal hernia    Hemorrhoid    History of blood transfusion    post child birth   Hyperlipidemia    IBS (irritable bowel syndrome)    Osteopenia 12/11/2012   On Ca++ and Vit D. Bone density in April 2014.  Next bone density due in April 2016.   PONV (postoperative nausea and vomiting)    PPD positive, treated 1999   Past Surgical History:  Procedure Laterality Date   ABDOMINAL HYSTERECTOMY Bilateral 01/30/2013   Procedure: HYSTERECTOMY ABDOMINAL;  Surgeon: Melina Schools, MD;  Location: [facility];  Service: Gynecology;  Laterality: Bilateral;   BREAST BIOPSY Left 1995   BREAST BIOPSY Bilateral 03/2012   "one on the left; 2 on the right" (05/08/2012)   [place]   COLONOSCOPY  12/06/93   Dr. Hayes:single diminutive polyp of the descending colon/extrernal hemorrhoids, benign path   COLONOSCOPY  11/30/98   Dr Hayes:small rectal polyp/internal hemorrhoids, benign path   COLONOSCOPY  11/26/2006   Dr. Hayes:sigmoid polyp/small interal hemorrhoids, adenomatous   COLONOSCOPY N/A 03/30/2016   Dr. Oneida Alar: normal TI, 4 mm rectal polyp, multiple small and large-mouthed diverticula in rectosigmoid and sigmoid colon, non-bleeding internal hemorrhoids, tubular adenoma on path. surveillance in 5 yeras   COLONOSCOPY WITH ESOPHAGOGASTRODUODENOSCOPY (EGD) N/A 07/01/2012   Dr. Oneida Alar: 2 small adenomas, sigmoid colon diverticula, surveillance in 2019   [place]  07/01/2012   Dr. Oneida Alar: proximal esophageal web, Schatzki's ring at [employer] junction, s/p dilaiton. Mild non-erosive gastritis. Repeat EGD July 23, 2012.    ESOPHAGOGASTRODUODENOSCOPY  1994   Dr.  Cristina Gong: small hiatal hernia and Schatzki's ring widely patent, CLO test negative   ESOPHAGOGASTRODUODENOSCOPY (EGD) WITH ESOPHAGEAL DILATION N/A 07/23/2012   Dr. Fields:proximal esophageal web v. radiation induced stricture/schatzki ring/small HH, s/p dilation   LEFT HEART CATH AND CORONARY ANGIOGRAPHY N/A 03/16/2020   Procedure: LEFT HEART CATH AND CORONARY ANGIOGRAPHY;  Surgeon: Adrian Prows, MD;  Location: [facility];  Service: Cardiovascular;  Laterality: N/A;   MASTECTOMY COMPLETE / SIMPLE Left 05/08/2012   MASTECTOMY COMPLETE / SIMPLE W/ SENTINEL NODE BIOPSY Right 05/08/2012   SALPINGOOPHORECTOMY Bilateral 01/30/2013   Procedure: SALPINGO OOPHORECTOMY;  Surgeon: Melina Schools, MD;  Location: [facility];  Service: Gynecology;  Laterality: Bilateral;   SIMPLE MASTECTOMY WITH AXILLARY SENTINEL NODE BIOPSY Right 05/08/2012   Procedure: RIGHT TOTAL MASTECTOMY WITH AXILLARY SENTINEL NODE BIOPSY;  Surgeon: Haywood Lasso, MD;  Location: [facility];  Service: General;  Laterality: Right;   SIMPLE MASTECTOMY WITH AXILLARY SENTINEL NODE BIOPSY Left 05/08/2012   Procedure: LEFT TOTAL MASTECTOMY;  Surgeon: Haywood Lasso, MD;  Location: [facility];  Service: General;  Laterality: Left;   TONSILLECTOMY AND ADENOIDECTOMY  ~ 2008   TUBAL LIGATION  1976   UVULOPALATOPHARYNGOPLASTY, TONSILLECTOMY AND SEPTOPLASTY  ~ 2008   Family History  Problem Relation Age of Onset   Lung cancer Mother 11       smoker   Depression Father    Leukemia Father 68       Hairy Cell at 50, CLL at 64   Lymphoma Father 45       Hodgkins lymphoma   Lung cancer Sister 62   Thyroid cancer Brother 40       Medullary   Lymphoma Brother 46       Follicular lymphoma   Breast cancer Maternal Aunt        mother's paternal half sister   Colon cancer Maternal Aunt        mother's maternal half sister   Lung cancer Maternal Uncle        mother's paternal half brother   Cancer Paternal Uncle        oral cancer dx in his 11s   Lung  cancer Maternal Grandmother        smoker   Lung cancer Cousin        paternal cousin   Breast cancer Cousin        paternal cousin died in her 6s   Cancer Daughter        thyroid   Other Daughter        leiomyoma of esophagus   Thyroid cancer Daughter    Cancer Daughter        breast   Breast cancer Daughter    Social History   Socioeconomic History   Marital status: Married    Spouse name: Mortimer Fries   Number of children: 2   Years of education: 12th grade   Highest education level: Not on file  Occupational History   Occupation: retired    Fish farm manager: [employer]  Tobacco Use   Smoking status: Never   Smokeless tobacco: Never  Vaping Use   Vaping Use: Never used  Substance and Sexual Activity   Alcohol use: Not Currently    Comment: rare    Drug use: No   Sexual activity: Not Currently  Other Topics Concern   Not on file  Social History Narrative   Married 105 yrs.Lives with her husband.Retired.   Social Determinants of Health   Financial Resource Strain: Low Risk  (01/06/2021)   Overall Financial Resource Strain (CARDIA)    Difficulty of Paying Living Expenses: Not hard at all  Food Insecurity: No Food Insecurity (01/06/2021)   Hunger Vital Sign    Worried About Running Out of Food in the Last Year: Never true    Ran Out of Food in the Last Year: Never true  Transportation Needs: No Transportation Needs (01/06/2021)   PRAPARE - Hydrologist (Medical): No    Lack of Transportation (Non-Medical): No  Physical Activity: Inactive (01/06/2021)   Exercise Vital Sign    Days of Exercise per Week: 0 days    Minutes of Exercise per Session: 0 min  Stress: No Stress Concern Present (01/06/2021)   [place]    Feeling of  Stress : Only a little  Social Connections: Socially Integrated (01/06/2021)   Social Connection and Isolation Panel [NHANES]    Frequency of Communication with  Friends and Family: More than three times a week    Frequency of Social Gatherings with Friends and Family: Three times a week    Attends Religious Services: More than 4 times per year    Active Member of Clubs or Organizations: Yes    Attends Archivist Meetings: More than 4 times per year    Marital Status: Married    Tobacco Counseling Counseling given: Not Answered        Diabetic? Yes        Activities of Daily Living     No data to display          Patient Care Team: Fayrene Helper, MD as PCP - General (Family Medicine) Everardo All, MD as Consulting Physician (Hematology and Oncology) Danie Binder, MD (Inactive) as Attending Physician (Gastroenterology) Eloise Harman, DO as Consulting Physician (Gastroenterology)  Indicate any recent Medical Services you may have received from other than [facility] providers in the past year (date may be approximate).     Assessment:   This is a routine wellness examination for Amy Houston.  Hearing/Vision screen No results found. Vision screen 2023  Dietary issues and exercise activities discussed: Encouraged lifestyle modifications follow diet low in saturated fat, reduce dietary salt intake, avoid fatty foods, maintain an exercise routine 3 to 5 days a week for a minimum total of 150 minutes.       Goals Addressed   None    Depression Screen    01/06/2022    9:35 AM 12/06/2021    1:12 PM 10/20/2021   11:24 AM 08/04/2021    2:31 PM 07/27/2021    4:13 PM 07/26/2021   11:00 AM 07/26/2021   10:18 AM  PHQ 2/9 Scores  PHQ - 2 Score 0 0 0  4 4 0  PHQ- 9 Score 1    14 10       Information is confidential and restricted. Go to Review Flowsheets to unlock data.    Fall Risk    01/06/2022    9:35 AM 12/06/2021    1:12 PM 10/20/2021   11:24 AM 07/26/2021   10:17 AM 05/20/2021    9:26 AM  Fall Risk   Falls in the past year? 1 0 0 0 0  Number falls in past yr: 0 0 0 0 0  Injury with Fall? 1 0 0 0 0  Risk  for fall due to : History of fall(s) No Fall Risks No Fall Risks No Fall Risks No Fall Risks  Follow up Falls evaluation completed Falls evaluation completed Falls evaluation completed Falls evaluation completed Falls evaluation completed    [place]:  Any stairs in or around the home? No  If so, are there any without handrails? No  Home free of loose throw rugs in walkways, pet beds, electrical cords, etc? Yes  Adequate lighting in your home to reduce risk of falls? Yes   ASSISTIVE DEVICES UTILIZED TO PREVENT FALLS:  Life alert? No  Use of a cane, walker or w/c? No  Grab bars in the bathroom? No  Shower chair or bench in shower? No  Elevated toilet seat or a handicapped toilet? No   TIMED UP AND GO:  Was the test performed? Yes .  Length of time to ambulate 10 feet: 6  sec.   Gait steady and fast without use of assistive device  Cognitive Function:    07/26/2021   11:37 AM  MMSE - Mini Mental State Exam  Orientation to time 5  Orientation to Place 5  Registration 3  Attention/ Calculation 5  Recall 3  Language- name 2 objects 2  Language- repeat 1  Language- follow 3 step command 3  Language- read & follow direction 1  Write a sentence 1  Copy design 1  Total score 30        01/06/2021   11:27 AM  6CIT Screen  What Year? 0 points  What month? 0 points  What time? 0 points  Count back from 20 0 points  Months in reverse 0 points  Repeat phrase 6 points  Total Score 6 points    Immunizations Immunization History  Administered Date(s) Administered   Fluad Quad(high Dose 65+) 10/01/2019, 12/16/2020, 12/06/2021   Influenza Split 10/16/2012   Influenza, High Dose Seasonal PF 10/12/2018   Influenza-Unspecified 10/16/2013, 10/02/2014   PFIZER(Purple Top)SARS-COV-2 Vaccination 01/23/2019, 02/14/2019, 11/13/2019   Pneumococcal Conjugate-13 01/08/2019   Pneumococcal Polysaccharide-23 04/29/2020   Tdap 11/21/2013    TDAP  status: Up to date  Flu Vaccine status: Up to date  Pneumococcal vaccine status: Up to date  Covid-19 vaccine status: Information provided on how to obtain vaccines.   Qualifies for Shingles Vaccine? Yes   Zostavax completed No   Shingrix Completed?: No.    Education has been provided regarding the importance of this vaccine. Patient has been advised to call insurance company to determine out of pocket expense if they have not yet received this vaccine. Advised may also receive vaccine at local pharmacy or Health Dept. Verbalized acceptance and understanding.  Screening Tests Health Maintenance  Topic Date Due   Zoster Vaccines- Shingrix (1 of 2) Never done   COVID-19 Vaccine (4 - 2023-24 season) 09/02/2021   Medicare Annual Wellness (AWV)  01/06/2022   OPHTHALMOLOGY EXAM  03/01/2022   FOOT EXAM  04/22/2022   Diabetic kidney evaluation - Urine ACR  05/17/2022   HEMOGLOBIN A1C  05/24/2022   Diabetic kidney evaluation - eGFR measurement  11/24/2022   DTaP/Tdap/Td (2 - Td or Tdap) 11/22/2023   COLONOSCOPY (Pts 45-47yrs Insurance coverage will need to be confirmed)  03/31/2026   Pneumonia Vaccine 61+ Years old  Completed   DEXA SCAN  Completed   Hepatitis C Screening  Completed   HPV VACCINES  Aged Out    Health Maintenance  Health Maintenance Due  Topic Date Due   Zoster Vaccines- Shingrix (1 of 2) Never done   COVID-19 Vaccine (4 - 2023-24 season) 09/02/2021   Medicare Annual Wellness (AWV)  01/06/2022    Colorectal cancer screening: Type of screening: Colonoscopy. Completed 2018. Repeat every 10 years  Mammogram status: Completed 2023. Repeat every year  Bone Density status: Completed 2022. Results reflect: Bone density results: OSTEOPENIA. Repeat every 2 years.  Lung Cancer Screening: (Low Dose CT Chest recommended if Age 67-80 years, 30 pack-year currently smoking OR have quit w/in 15years.) does not qualify.   Lung Cancer Screening Referral: N/A  Additional  Screening:  Hepatitis C Screening: does qualify; Completed 2022  Vision Screening: Recommended annual ophthalmology exams for early detection of glaucoma and other disorders of the eye. Is the patient up to date with their annual eye exam?  Yes  Who is the provider or what is the name of the office in which the patient attends annual  eye exams?  [place] If pt is not established with a provider, would they like to be referred to a provider to establish care? No .   Dental Screening: Recommended annual dental exams for proper oral hygiene  Community Resource Referral / Chronic Care Management: CRR required this visit?  No   CCM required this visit?  No      Plan:     I have personally reviewed and noted the following in the patient's chart:   Medical and social history Use of alcohol, tobacco or illicit drugs  Current medications and supplements including opioid prescriptions. Patient is not currently taking opioid prescriptions. Functional ability and status Nutritional status Physical activity Advanced directives List of other physicians Hospitalizations, surgeries, and ER visits in previous 12 months Vitals Screenings to include cognitive, depression, and falls Referrals and appointments  In addition, I have reviewed and discussed with patient certain preventive protocols, quality metrics, and best practice recommendations. A written personalized care plan for preventive services as well as general preventive health recommendations were provided to patient.     [place], Tyrrell   02/09/2022

## 2022-02-14 ENCOUNTER — Ambulatory Visit (INDEPENDENT_AMBULATORY_CARE_PROVIDER_SITE_OTHER): Payer: PPO | Admitting: Psychiatry

## 2022-02-14 DIAGNOSIS — F3340 Major depressive disorder, recurrent, in remission, unspecified: Secondary | ICD-10-CM | POA: Diagnosis not present

## 2022-02-14 NOTE — Progress Notes (Unsigned)
IN-PERSON  THERAPIST PROGRESS NOTE  Session Time: Tuesday 02/14/2022 4:10 PM                                            Participation Level: Active  Behavioral Response: Well GroomedAlertDepressed  Type of Therapy: Individual Therapy  Treatment Goals addressed: Reduce frequency, intensity, and duration of depression symptoms AEB pt reducing episodes of anger,irritability, depressed mood to 1 x per week consistently for 60 days per pt's report / Charie WILL IDENTIFY 3 COGNITIVE PATTERNS AND BELIEFS THAT SUPPORT DEPRESSION          ProgressTowards Goals: Progressing  Interventions: CBT and Supportive  Summary: LEOMIA BLAKE is a 77 y.o. female who is referred for services by PCP Dr. Moshe Cipro due to pt experiencing symptoms of depression and transition issues. She denies any psychiatric hospitalizations. Pt and husband participated in couples' counseling several years ago. Pt currently is taking effexor- this is being managed by PCP.  Patient states being stressed to the max due to this chapter in her life.  She says she wants to spend the rest of her life stress-free.  She has a history of breast cancer and this along with her age has caused her to be more in touch with her mortality.  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  However, she reports her most significant stressor is the relationship with her husband.  She reports he talks excessively, does not listen, repeats the same information several times, and constantly tells her things to do.  Patient's current symptoms include anger outburst, irritability, muscle tension, poor concentration, procrastination, and depressed mood.  Patient last was seen about 5-6 weeks ago. She reports doing well and denies any symptoms of depression since last session.  She reports better overall communication with husband and having realistic expectations.  However, she reports a recent incident where she became  very frustrated with the conversation with husband and her daughter.  She reports going outside of her window of tolerance but then becoming aware of this and disengaging from the conversation.  Patient reports using grounding skills by going to her room and calling a friend.  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  Patient remains pleased with her progress in therapy and expresses confidence in her abilities to use coping skills.  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   suicidal/Homicidal: Nowithout intent/plan  Therapist Response: Reviewed symptoms, discussed stressors, facilitated expression of thoughts and feelings, validated feelings, praised and reinforced patient's recognition of being outside of the window of tolerance and using grounding techniques, discussed rationale for and assisted patient practice activities to increase mindfulness skills, developed plan with patient to practice a mindfulness activity daily between sessions, processed patient's feelings about termination at next session, praised and reinforced patient's continued involvement in activity.    .  Plan: Return again in 2 weeks.  Diagnosis: Recurrent major depressive disorder, in remission [facility])  Collaboration of Care: Psychiatrist AEB patient is scheduled to see psychiatrist Dr. Harrington Challenger in this practice  Patient/Guardian was advised Release of Information must be obtained prior to any record release in order to collaborate their care with an outside provider. Patient/Guardian was advised if they have not already done so to contact the registration department to sign all necessary forms  in order for [place] to release information regarding their care.   Consent: Patient/Guardian gives verbal consent for treatment and assignment of benefits for services provided during this visit. Patient/Guardian expressed understanding and agreed to proceed.   Alonza Smoker, LCSW 02/14/2022

## 2022-02-19 MED ORDER — NYSTATIN 100000 UNIT/GM EX CREA: 1.0000 | TOPICAL_CREAM | Freq: Two times a day (BID) | CUTANEOUS | 1 refills | Status: DC

## 2022-03-09 ENCOUNTER — Other Ambulatory Visit: Payer: Self-pay | Admitting: Cardiology

## 2022-03-09 ENCOUNTER — Other Ambulatory Visit: Payer: Self-pay | Admitting: Family Medicine

## 2022-03-09 DIAGNOSIS — I10 Essential (primary) hypertension: Secondary | ICD-10-CM

## 2022-03-14 DIAGNOSIS — E1159 Type 2 diabetes mellitus with other circulatory complications: Secondary | ICD-10-CM | POA: Diagnosis not present

## 2022-03-15 ENCOUNTER — Encounter: Payer: Self-pay | Admitting: Family Medicine

## 2022-03-15 ENCOUNTER — Ambulatory Visit: Payer: PPO | Admitting: Family Medicine

## 2022-03-15 ENCOUNTER — Ambulatory Visit (INDEPENDENT_AMBULATORY_CARE_PROVIDER_SITE_OTHER): Payer: PPO | Admitting: Family Medicine

## 2022-03-15 ENCOUNTER — Encounter (HOSPITAL_COMMUNITY): Payer: Self-pay | Admitting: Hematology

## 2022-03-15 VITALS — BP 124/70 | HR 56 | Ht <= 58 in | Wt 159.1 lb

## 2022-03-15 DIAGNOSIS — E1159 Type 2 diabetes mellitus with other circulatory complications: Secondary | ICD-10-CM

## 2022-03-15 DIAGNOSIS — E6609 Other obesity due to excess calories: Secondary | ICD-10-CM

## 2022-03-15 DIAGNOSIS — I10 Essential (primary) hypertension: Secondary | ICD-10-CM | POA: Diagnosis not present

## 2022-03-15 DIAGNOSIS — Z6833 Body mass index (BMI) 33.0-33.9, adult: Secondary | ICD-10-CM

## 2022-03-15 DIAGNOSIS — E785 Hyperlipidemia, unspecified: Secondary | ICD-10-CM

## 2022-03-15 DIAGNOSIS — E559 Vitamin D deficiency, unspecified: Secondary | ICD-10-CM

## 2022-03-15 DIAGNOSIS — E041 Nontoxic single thyroid nodule: Secondary | ICD-10-CM

## 2022-03-15 DIAGNOSIS — E7800 Pure hypercholesterolemia, unspecified: Secondary | ICD-10-CM | POA: Diagnosis not present

## 2022-03-15 DIAGNOSIS — Z0000 Encounter for general adult medical examination without abnormal findings: Secondary | ICD-10-CM

## 2022-03-15 LAB — BMP8+EGFR
BUN/Creatinine Ratio: 20 (ref 12–28)
BUN: 19 mg/dL (ref 8–27)
CO2: 21 mmol/L (ref 20–29)
Calcium: 9.8 mg/dL (ref 8.7–10.3)
Chloride: 109 mmol/L — ABNORMAL HIGH (ref 96–106)
Creatinine, Ser: 0.93 mg/dL (ref 0.57–1.00)
Glucose: 140 mg/dL — ABNORMAL HIGH (ref 70–99)
Potassium: 4.3 mmol/L (ref 3.5–5.2)
Sodium: 143 mmol/L (ref 134–144)
eGFR: 64 mL/min/{1.73_m2} (ref >59)

## 2022-03-15 LAB — HEMOGLOBIN A1C
Est. average glucose Bld gHb Est-mCnc: 148 mg/dL
Hgb A1c MFr Bld: 6.8 % — ABNORMAL HIGH (ref 4.8–5.6)

## 2022-03-15 NOTE — Patient Instructions (Addendum)
Annual exam in early September, call if you need me sooner  EXCELLENT labs and blood pressure  Fasting lipid, cmp and EGFr, hBA1C, cBC, TSH and vit D 5 to 7 days before visit and microalb/urea  Thanks for choosing [facility], we consider it a privelige to serve you.

## 2022-03-20 ENCOUNTER — Encounter: Payer: Self-pay | Admitting: Family Medicine

## 2022-03-20 DIAGNOSIS — E669 Obesity, unspecified: Secondary | ICD-10-CM | POA: Insufficient documentation

## 2022-03-20 NOTE — Assessment & Plan Note (Signed)
Check TSH 

## 2022-03-20 NOTE — Assessment & Plan Note (Signed)
Controlled, no change in medication DASH diet and commitment to daily physical activity for a minimum of 30 minutes discussed and encouraged, as a part of hypertension management. The importance of attaining a healthy weight is also discussed.     03/15/2022   11:07 AM 02/09/2022    3:25 PM 01/06/2022    9:58 AM 01/06/2022    9:34 AM 12/26/2021    7:30 AM 12/26/2021    7:16 AM 12/06/2021    1:10 PM  BP/Weight  Systolic BP [id number]  [id number]  Diastolic BP 70 72 64 42 78  81  Wt. (Lbs) 159.08 161  156.04  153 158  BMI 33.25 kg/m2 33.65 kg/m2  32.61 kg/m2  31.98 kg/m2 33.02 kg/m2

## 2022-03-20 NOTE — Progress Notes (Signed)
Amy Houston     MRN: [id number]      DOB: 1945-02-01   HPI Amy Houston is here for follow up and re-evaluation of chronic medical conditions, medication management and review of any available recent lab and radiology data.  Preventive health is updated, specifically  Cancer screening and Immunization.   Questions or concerns regarding consultations or procedures which the PT has had in the interim are  addressed. The PT denies any adverse reactions to current medications since the last visit.  There are no new concerns.  There are no specific complaints   ROS Denies recent fever or chills. Denies sinus pressure, nasal congestion, ear pain or sore throat. Denies chest congestion, productive cough or wheezing. Denies chest pains, palpitations and leg swelling Denies abdominal pain, nausea, vomiting,diarrhea or constipation.   Denies dysuria, frequency, hesitancy or incontinence. Denies joint pain, swelling and limitation in mobility. Denies headaches, seizures, numbness, or tingling. Denies depression, anxiety or insomnia. Denies skin break down or rash.   PE  BP 124/70 (BP Location: Right Arm, Patient Position: Sitting, Cuff Size: Large)   Pulse (!) 56   Ht 4\' 10"  (1.473 m)   Wt 159 lb 1.3 oz (72.2 kg)   SpO2 94%   BMI 33.25 kg/m   Patient alert and oriented and in no cardiopulmonary distress.  HEENT: No facial asymmetry, EOMI,     Neck supple .  Chest: Clear to auscultation bilaterally.  CVS: S1, S2 no murmurs, no S3.Regular rate.  ABD: Soft non tender.   Ext: No edema  MS: Adequate ROM spine, shoulders, hips and knees.  Skin: Intact, no ulcerations or rash noted.  Psych: Good eye contact, normal affect. Memory intact not anxious or depressed appearing.  CNS: CN 2-12 intact, power,  normal throughout.no focal deficits noted.   Assessment & Plan  Primary hypertension Controlled, no change in medication DASH diet and commitment to daily physical activity  for a minimum of 30 minutes discussed and encouraged, as a part of hypertension management. The importance of attaining a healthy weight is also discussed.     03/15/2022   11:07 AM 02/09/2022    3:25 PM 01/06/2022    9:58 AM 01/06/2022    9:34 AM 12/26/2021    7:30 AM 12/26/2021    7:16 AM 12/06/2021    1:10 PM  BP/Weight  Systolic BP A999333 Q000111Q 0000000 Q000111Q A999333  A999333  Diastolic BP 70 72 64 42 78  81  Wt. (Lbs) 159.08 161  156.04  153 158  BMI 33.25 kg/m2 33.65 kg/m2  32.61 kg/m2  31.98 kg/m2 33.02 kg/m2       Type 2 diabetes mellitus with vascular disease (HCC) Controlled, no change in medication Amy Houston is reminded of the importance of commitment to daily physical activity for 30 minutes or more, as able and the need to limit carbohydrate intake to 30 to 60 grams per meal to help with blood sugar control.   The need to take medication as prescribed, test blood sugar as directed, and to call between visits if there is a concern that blood sugar is uncontrolled is also discussed.   Amy Houston is reminded of the importance of daily foot exam, annual eye examination, and good blood sugar, blood pressure and cholesterol control.     Latest Ref Rng & Units 03/14/2022    9:55 AM 11/23/2021    9:20 AM 08/11/2021   10:41 AM 07/20/2021   11:19 AM 05/16/2021  9:38 AM  Diabetic Labs  HbA1c 4.8 - 5.6 % 6.8  7.1   6.9    Micro/Creat Ratio 0 - 29 mg/g creat     43   Chol 100 - 199 mg/dL  118   117    HDL >39 mg/dL  41   41    Calc LDL 0 - 99 mg/dL  54   50    Triglycerides 0 - 149 mg/dL  128   156    Creatinine 0.57 - 1.00 mg/dL 0.93  0.95  0.93  0.86  0.96       03/15/2022   11:07 AM 02/09/2022    3:25 PM 01/06/2022    9:58 AM 01/06/2022    9:34 AM 12/26/2021    7:30 AM 12/26/2021    7:16 AM 12/06/2021    1:10 PM  BP/Weight  Systolic BP A999333 Q000111Q 0000000 Q000111Q A999333  A999333  Diastolic BP 70 72 64 42 78  81  Wt. (Lbs) 159.08 161  156.04  153 158  BMI 33.25 kg/m2 33.65 kg/m2  32.61 kg/m2  31.98 kg/m2 33.02  kg/m2      Latest Ref Rng & Units 04/21/2021   10:00 AM 03/01/2021   12:00 AM  Foot/eye exam completion dates  Eye Exam No Retinopathy  No Retinopathy      Foot Form Completion  Done      This result is from an external source.        Thyroid nodule Check TSH  Hypercholesteremia Hyperlipidemia:Low fat diet discussed and encouraged.   Lipid Panel  Lab Results  Component Value Date   CHOL 118 11/23/2021   HDL 41 11/23/2021   LDLCALC 54 11/23/2021   LDLDIRECT 71 02/26/2020   TRIG 128 11/23/2021   CHOLHDL 2.9 11/23/2021     Controlled, no change in medication   Class 1 obesity with serious comorbidity and body mass index (BMI) of 33.0 to 33.9 in adult  Patient re-educated about  the importance of commitment to a  minimum of 150 minutes of exercise per week as able.  The importance of healthy food choices with portion control discussed, as well as eating regularly and within a 12 hour window most days. The need to choose "clean , green" food 50 to 75% of the time is discussed, as well as to make water the primary drink and set a goal of 64 ounces water daily.       03/15/2022   11:07 AM 02/09/2022    3:25 PM 01/06/2022    9:34 AM  Weight /BMI  Weight 159 lb 1.3 oz 161 lb 156 lb 0.6 oz  Height 4\' 10"  (1.473 m) 4\' 10"  (1.473 m) 4\' 10"  (1.473 m)  BMI 33.25 kg/m2 33.65 kg/m2 32.61 kg/m2

## 2022-03-20 NOTE — Assessment & Plan Note (Signed)
Hyperlipidemia:Low fat diet discussed and encouraged.   Lipid Panel  Lab Results  Component Value Date   CHOL 118 11/23/2021   HDL 41 11/23/2021   LDLCALC 54 11/23/2021   LDLDIRECT 71 02/26/2020   TRIG 128 11/23/2021   CHOLHDL 2.9 11/23/2021     Controlled, no change in medication  

## 2022-03-20 NOTE — Assessment & Plan Note (Signed)
Controlled, no change in medication Amy Houston is reminded of the importance of commitment to daily physical activity for 30 minutes or more, as able and the need to limit carbohydrate intake to 30 to 60 grams per meal to help with blood sugar control.   The need to take medication as prescribed, test blood sugar as directed, and to call between visits if there is a concern that blood sugar is uncontrolled is also discussed.   Amy Houston is reminded of the importance of daily foot exam, annual eye examination, and good blood sugar, blood pressure and cholesterol control.     Latest Ref Rng & Units 03/14/2022    9:55 AM 11/23/2021    9:20 AM 08/11/2021   10:41 AM 07/20/2021   11:19 AM 05/16/2021    9:38 AM  Diabetic Labs  HbA1c 4.8 - 5.6 % 6.8  7.1   6.9    Micro/Creat Ratio 0 - 29 mg/g creat     43   Chol 100 - 199 mg/dL  118   117    HDL >39 mg/dL  41   41    Calc LDL 0 - 99 mg/dL  54   50    Triglycerides 0 - 149 mg/dL  128   156    Creatinine 0.57 - 1.00 mg/dL 0.93  0.95  0.93  0.86  0.96       03/15/2022   11:07 AM 02/09/2022    3:25 PM 01/06/2022    9:58 AM 01/06/2022    9:34 AM 12/26/2021    7:30 AM 12/26/2021    7:16 AM 12/06/2021    1:10 PM  BP/Weight  Systolic BP A999333 Q000111Q 0000000 Q000111Q A999333  A999333  Diastolic BP 70 72 64 42 78  81  Wt. (Lbs) 159.08 161  156.04  153 158  BMI 33.25 kg/m2 33.65 kg/m2  32.61 kg/m2  31.98 kg/m2 33.02 kg/m2      Latest Ref Rng & Units 04/21/2021   10:00 AM 03/01/2021   12:00 AM  Foot/eye exam completion dates  Eye Exam No Retinopathy  No Retinopathy      Foot Form Completion  Done      This result is from an external source.

## 2022-03-20 NOTE — Assessment & Plan Note (Signed)
  Patient re-educated about  the importance of commitment to a  minimum of 150 minutes of exercise per week as able.  The importance of healthy food choices with portion control discussed, as well as eating regularly and within a 12 hour window most days. The need to choose "clean , green" food 50 to 75% of the time is discussed, as well as to make water the primary drink and set a goal of 64 ounces water daily.       03/15/2022   11:07 AM 02/09/2022    3:25 PM 01/06/2022    9:34 AM  Weight /BMI  Weight 159 lb 1.3 oz 161 lb 156 lb 0.6 oz  Height 4\' 10"  (1.473 m) 4\' 10"  (1.473 m) 4\' 10"  (1.473 m)  BMI 33.25 kg/m2 33.65 kg/m2 32.61 kg/m2

## 2022-03-21 ENCOUNTER — Other Ambulatory Visit: Payer: Self-pay | Admitting: Family Medicine

## 2022-03-27 ENCOUNTER — Encounter: Payer: Self-pay | Admitting: Internal Medicine

## 2022-03-27 ENCOUNTER — Telehealth (INDEPENDENT_AMBULATORY_CARE_PROVIDER_SITE_OTHER): Payer: PPO | Admitting: Internal Medicine

## 2022-03-27 ENCOUNTER — Encounter: Payer: Self-pay | Admitting: Family Medicine

## 2022-03-27 DIAGNOSIS — B9689 Other specified bacterial agents as the cause of diseases classified elsewhere: Secondary | ICD-10-CM | POA: Insufficient documentation

## 2022-03-27 DIAGNOSIS — J209 Acute bronchitis, unspecified: Secondary | ICD-10-CM

## 2022-03-27 DIAGNOSIS — J208 Acute bronchitis due to other specified organisms: Secondary | ICD-10-CM

## 2022-03-27 MED ORDER — AZITHROMYCIN 250 MG PO TABS: ORAL_TABLET | ORAL | 0 refills | Status: AC

## 2022-03-27 NOTE — Progress Notes (Signed)
Virtual Visit via Video Note  I connected with Amy Houston on 03/27/22 at  4:40 PM EDT by a video enabled telemedicine application and verified that I am speaking with the correct person using two identifiers.  Patient Location: Home Provider Location: Office/Clinic  I discussed the limitations, risks, security, and privacy concerns of performing an evaluation and management service by video and the availability of in person appointments. I also discussed with the patient that there may be a patient responsible charge related to this service. The patient expressed understanding and agreed to proceed.  Subjective: PCP: Amy Helper, MD  Chief Complaint  Patient presents with   Nasal Congestion    Coughing, phlegm, low grade fever, aching all over. Started 03/23/2022.   Amy Houston has been evaluated today through an acute video encounter endorsing symptoms of cough with white sputum production, subjective fever/chills, and bodyaches.  Her symptoms started last Thursday (3/21).  Amy Houston denies headache, ear discomfort, throat irritation, nausea/vomiting, and diarrhea.  She has been using Coricidin, Mucinex, and NyQuil without significant symptomatic improvement.  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  She is interested in additional medication options available today.  ROS: Per HPI  Current Outpatient Medications:    acetaminophen (TYLENOL) 500 MG tablet, Take 1,000 mg by mouth daily as needed for moderate pain or headache. , Disp: , Rfl:    aspirin EC 81 MG tablet, Take 81 mg by mouth daily. Swallow whole., Disp: , Rfl:    azithromycin (ZITHROMAX) 250 MG tablet, Take 2 tablets on day 1, then 1 tablet daily on days 2 through 5, Disp: 6 tablet, Rfl: 0   blood glucose meter kit and supplies, Dispense based on patient and insurance preference. Once daily testing DX E11.9, Disp: 1 each, Rfl: 0   Chlorphen-PE-Acetaminophen (NOREL AD) 4-10-325  MG TABS, Take 1 tablet by mouth 3 (three) times daily as needed (Nasal congestion)., Disp: 30 tablet, Rfl: 0   Cholecalciferol (VITAMIN D) 2000 units CAPS, Take 6,000 Units by mouth at bedtime. , Disp: , Rfl:    fluticasone (FLONASE) 50 MCG/ACT nasal spray, Place 1 spray into both nostrils daily as needed for allergies or rhinitis., Disp: 16 g, Rfl: 2   glucose blood test strip, Use as instructed daily dx e11.9, Disp: 100 each, Rfl: 12   JARDIANCE 25 MG TABS tablet, Take 1 tablet (25 mg total) by mouth daily before breakfast., Disp: 30 tablet, Rfl: 0   Lancets 30G MISC, Once daily testing dx e11.9, Disp: 100 each, Rfl: 5   letrozole (FEMARA) 2.5 MG tablet, TAKE (1) TABLET BY MOUTH ONCE DAILY., Disp: 30 tablet, Rfl: 6   lisinopril (ZESTRIL) 40 MG tablet, Take 1 tablet (40 mg total) by mouth daily., Disp: 90 tablet, Rfl: 3   metoprolol succinate (TOPROL-XL) 100 MG 24 hr tablet, TAKE 1 TABLET BY MOUTH ONCE A DAY., Disp: 90 tablet, Rfl: 0   Misc Natural Products (TART CHERRY ADVANCED PO), Take by mouth. 1 daily, Disp: , Rfl:    montelukast (SINGULAIR) 10 MG tablet, Take 10 mg by mouth at bedtime., Disp: , Rfl:    Multiple Vitamin (MULTIVITAMIN WITH MINERALS) TABS, Take 1 tablet by mouth at bedtime. , Disp: , Rfl:    nitroGLYCERIN (NITROSTAT) 0.4 MG SL tablet, Place 0.4 mg under the tongue every 5 (five) minutes as needed for chest pain., Disp: , Rfl:    nystatin cream (MYCOSTATIN), Apply 1 Application topically 2 (two)  times daily., Disp: 30 g, Rfl: 1   omeprazole (PRILOSEC) 20 MG capsule, TAKE ONE CAPSULE BY MOUTH ONCE DAILY., Disp: 90 capsule, Rfl: 3   ondansetron (ZOFRAN ODT) 4 MG disintegrating tablet, Take 1 tablet (4 mg total) by mouth every 8 (eight) hours as needed for nausea or vomiting., Disp: 20 tablet, Rfl: 0   rosuvastatin (CRESTOR) 10 MG tablet, Take 1 tablet (10 mg total) by mouth daily., Disp: 90 tablet, Rfl: 0   venlafaxine XR (EFFEXOR-XR) 75 MG 24 hr capsule, TAKE ONE CAPSULE BY MOUTH  TWO TIMES DAILY, Disp: 60 capsule, Rfl: 0  Assessment and Plan:  Acute bacterial bronchitis Assessment & Plan: Evaluated today through acute video encounter for the symptoms endorsed above.  She describes a cough productive of white sputum, subjective fever/chills, body aches, and fatigue x 5 days.  Her symptoms have not significantly improved with supportive care measures. -Z-Pak prescribed today -I recommended she continue her current supportive care measures, including adequate hydration -She will return to care for in person evaluation if her symptoms worsen or fail to improve.  Follow Up Instructions: Return if symptoms worsen or fail to improve.   I discussed the assessment and treatment plan with the patient. The patient was provided an opportunity to ask questions, and all were answered. The patient agreed with the plan and demonstrated an understanding of the instructions.   The patient was advised to call back or seek an in-person evaluation if the symptoms worsen or if the condition fails to improve as anticipated.  The above assessment and management plan was discussed with the patient. The patient verbalized understanding of and has agreed to the management plan.   Johnette Abraham, MD

## 2022-03-27 NOTE — Assessment & Plan Note (Signed)
Evaluated today through acute video encounter for the symptoms endorsed above.  She describes a cough productive of white sputum, subjective fever/chills, body aches, and fatigue x 5 days.  Her symptoms have not significantly improved with supportive care measures. -Z-Pak prescribed today -I recommended she continue her current supportive care measures, including adequate hydration -She will return to care for in person evaluation if her symptoms worsen or fail to improve.

## 2022-03-27 NOTE — Telephone Encounter (Signed)
scheduled

## 2022-04-24 ENCOUNTER — Other Ambulatory Visit: Payer: Self-pay | Admitting: Family Medicine

## 2022-05-24 ENCOUNTER — Ambulatory Visit (INDEPENDENT_AMBULATORY_CARE_PROVIDER_SITE_OTHER): Payer: PPO | Admitting: Family Medicine

## 2022-05-24 ENCOUNTER — Other Ambulatory Visit (HOSPITAL_COMMUNITY)
Admission: RE | Admit: 2022-05-24 | Discharge: 2022-05-24 | Disposition: A | Discharge status: Home / Self Care | Payer: PPO | Source: Ambulatory Visit | Attending: Family Medicine | Admitting: Family Medicine

## 2022-05-24 ENCOUNTER — Encounter: Payer: Self-pay | Admitting: Family Medicine

## 2022-05-24 ENCOUNTER — Other Ambulatory Visit: Payer: Self-pay

## 2022-05-24 VITALS — BP 126/51 | HR 65 | Ht <= 58 in | Wt 152.0 lb

## 2022-05-24 DIAGNOSIS — R350 Frequency of micturition: Secondary | ICD-10-CM

## 2022-05-24 DIAGNOSIS — N761 Subacute and chronic vaginitis: Secondary | ICD-10-CM | POA: Diagnosis not present

## 2022-05-24 DIAGNOSIS — R3915 Urgency of urination: Secondary | ICD-10-CM

## 2022-05-24 DIAGNOSIS — I10 Essential (primary) hypertension: Secondary | ICD-10-CM | POA: Diagnosis not present

## 2022-05-24 DIAGNOSIS — N8110 Cystocele, unspecified: Secondary | ICD-10-CM | POA: Diagnosis not present

## 2022-05-24 DIAGNOSIS — E1159 Type 2 diabetes mellitus with other circulatory complications: Secondary | ICD-10-CM | POA: Diagnosis not present

## 2022-05-24 DIAGNOSIS — R300 Dysuria: Secondary | ICD-10-CM | POA: Insufficient documentation

## 2022-05-24 LAB — POCT URINALYSIS DIP (CLINITEK)
Bilirubin, UA: NEGATIVE
Blood, UA: NEGATIVE
Glucose, UA: 500 mg/dL — AB
Ketones, POC UA: NEGATIVE mg/dL
Leukocytes, UA: NEGATIVE
Nitrite, UA: NEGATIVE
POC PROTEIN,UA: NEGATIVE
Spec Grav, UA: 1.02 (ref 1.010–1.025)
Urobilinogen, UA: 0.2 E.U./dL
pH, UA: 5.5 (ref 5.0–8.0)

## 2022-05-24 NOTE — Progress Notes (Signed)
Amy Houston     MRN: [id number]      DOB: 17-Nov-1945  Chief Complaint  Patient presents with   Urinary Tract Infection    Possible uti, urinary urgency, pressure    HPI Amy Houston is here  with a 4 week  h/o urgency and post void discomfort x 4 weeks,no flank pain  fever or chills C/o itvching in vaginal area, localized, cream she has used in the past is not beneficial currently ROS Denies recent fever or chills. Denies sinus pressure, nasal congestion, ear pain or sore throat. Denies chest congestion, productive cough or wheezing. Denies chest pains, palpitations and leg swelling Denies abdominal pain, nausea, vomiting,diarrhea or constipation.   Denies joint pain, swelling and limitation in mobility. Denies headaches, seizures, numbness, or tingling. Denies  uncontrolled depression, anxiety or insomnia.   PE  BP (!) 126/51 (BP Location: Right Arm, Patient Position: Sitting, Cuff Size: Normal)   Pulse 65   Ht 4\' 10"  (1.473 m)   Wt 152 lb (68.9 kg)   SpO2 97%   BMI 31.77 kg/m   Patient alert and oriented and in no cardiopulmonary distress.  HEENT: No facial asymmetry, EOMI,     Neck supple .  Chest: Clear to auscultation bilaterally.  CVS: S1, S2 no murmurs, no S3.Regular rate.  ABD: Soft non tender.   Ext: No edema  MS: Adequate ROM spine, shoulders, hips and knees.  Skin: Intact, no ulcerations or rash noted.  Psych: Good eye contact, normal affect. Memory intact not anxious or depressed appearing.  CNS: CN 2-12 intact, power,  normal throughout.no focal deficits noted.   Assessment & Plan  Dysuria CCUA and c/s if abn  Vaginitis Check wet prep self collected and  do HSV2 testing, also refer to Gyne  Primary hypertension Controlled, no change in medication   Type 2 diabetes mellitus with vascular disease (HCC) Controlled, no change in medication Amy Houston is reminded of the importance of commitment to daily physical activity for 30 minutes  or more, as able and the need to limit carbohydrate intake to 30 to 60 grams per meal to help with blood sugar control.   The need to take medication as prescribed, test blood sugar as directed, and to call between visits if there is a concern that blood sugar is uncontrolled is also discussed.   Amy Houston is reminded of the importance of daily foot exam, annual eye examination, and good blood sugar, blood pressure and cholesterol control.     Latest Ref Rng & Units 05/24/2022    5:05 PM 03/14/2022    9:55 AM 11/23/2021    9:20 AM 08/11/2021   10:41 AM 07/20/2021   11:19 AM  Diabetic Labs  HbA1c 4.8 - 5.6 %  6.8  7.1   6.9   Micro/Creat Ratio 0 - 29 mg/g creat 10       Chol 100 - 199 mg/dL   086   578   HDL >46 mg/dL   41   41   Calc LDL 0 - 99 mg/dL   54   50   Triglycerides 0 - 149 mg/dL   962   952   Creatinine 0.57 - 1.00 mg/dL  8.41  3.24  4.01  0.27       05/24/2022    4:13 PM 03/15/2022   11:07 AM 02/09/2022    3:25 PM 01/06/2022    9:58 AM 01/06/2022    9:34 AM 12/26/2021  7:30 AM 12/26/2021    7:16 AM  BP/Weight  Systolic BP 126 124 129 128 133 142   Diastolic BP 51 70 72 64 42 78   Wt. (Lbs) 152 159.08 161  156.04  153  BMI 31.77 kg/m2 33.25 kg/m2 33.65 kg/m2  32.61 kg/m2  31.98 kg/m2      Latest Ref Rng & Units 04/21/2021   10:00 AM 03/01/2021   12:00 AM  Foot/eye exam completion dates  Eye Exam No Retinopathy  No Retinopathy      Foot Form Completion  Done      This result is from an external source.      Updated lab needed at/ before next visit.

## 2022-05-24 NOTE — Patient Instructions (Addendum)
F/u in 4 to 6 weeks, call if you need me sooner Urine ACR today  Urine test in office shows no infecrtion  You are referred to Urologist re evaluation of bladder prolapse and new urinary symptoms  You are referred to Roseanne Reno re vaginal itch  Urine to be tested for fungal/ yeast infection today   HSVtype2 test today to evaluate vaginal itching/ discomfort  All BLOODWORK ordered in March is to be drawn fasting before your next appointment, you will NOT need urine testing then, we are doing it today  Thanks for choosing [facility], we consider it a privelige to serve you.

## 2022-05-26 LAB — MICROALBUMIN / CREATININE URINE RATIO
Creatinine, Urine: 48.2 mg/dL
Microalb/Creat Ratio: 10 mg/g creat (ref 0–29)
Microalbumin, Urine: 4.6 ug/mL

## 2022-05-27 LAB — HSV 1 AND 2 AB, IGG
HSV 1 Glycoprotein G Ab, IgG: <0.91 index (ref 0.00–0.90)
HSV 2 IgG, Type Spec: <0.91 index (ref 0.00–0.90)

## 2022-05-28 DIAGNOSIS — N760 Acute vaginitis: Secondary | ICD-10-CM | POA: Insufficient documentation

## 2022-05-28 DIAGNOSIS — R300 Dysuria: Secondary | ICD-10-CM | POA: Insufficient documentation

## 2022-05-28 NOTE — Assessment & Plan Note (Signed)
Check wet prep self collected and  do HSV2 testing, also refer to [facility]

## 2022-05-28 NOTE — Assessment & Plan Note (Signed)
CCUA and c/s if abn 

## 2022-05-28 NOTE — Assessment & Plan Note (Signed)
Controlled, no change in medication Ms. Santori is reminded of the importance of commitment to daily physical activity for 30 minutes or more, as able and the need to limit carbohydrate intake to 30 to 60 grams per meal to help with blood sugar control.   The need to take medication as prescribed, test blood sugar as directed, and to call between visits if there is a concern that blood sugar is uncontrolled is also discussed.   Ms. Prothero is reminded of the importance of daily foot exam, annual eye examination, and good blood sugar, blood pressure and cholesterol control.     Latest Ref Rng & Units 05/24/2022    5:05 PM 03/14/2022    9:55 AM 11/23/2021    9:20 AM 08/11/2021   10:41 AM 07/20/2021   11:19 AM  Diabetic Labs  HbA1c 4.8 - 5.6 %  6.8  7.1   6.9   Micro/Creat Ratio 0 - 29 mg/g creat 10       Chol 100 - 199 mg/dL   409   811   HDL >91 mg/dL   41   41   Calc LDL 0 - 99 mg/dL   54   50   Triglycerides 0 - 149 mg/dL   478   295   Creatinine 0.57 - 1.00 mg/dL  6.21  3.08  6.57  8.46       05/24/2022    4:13 PM 03/15/2022   11:07 AM 02/09/2022    3:25 PM 01/06/2022    9:58 AM 01/06/2022    9:34 AM 12/26/2021    7:30 AM 12/26/2021    7:16 AM  BP/Weight  Systolic BP 126 124 129 128 133 142   Diastolic BP 51 70 72 64 42 78   Wt. (Lbs) 152 159.08 161  156.04  153  BMI 31.77 kg/m2 33.25 kg/m2 33.65 kg/m2  32.61 kg/m2  31.98 kg/m2      Latest Ref Rng & Units 04/21/2021   10:00 AM 03/01/2021   12:00 AM  Foot/eye exam completion dates  Eye Exam No Retinopathy  No Retinopathy      Foot Form Completion  Done      This result is from an external source.      Updated lab needed at/ before next visit.

## 2022-05-28 NOTE — Assessment & Plan Note (Signed)
Controlled, no change in medication  

## 2022-05-30 LAB — CERVICOVAGINAL ANCILLARY ONLY
Bacterial Vaginitis (gardnerella): NEGATIVE
Candida Glabrata: NEGATIVE
Candida Vaginitis: POSITIVE — AB
Comment: NEGATIVE
Comment: NEGATIVE
Comment: NEGATIVE
Comment: NEGATIVE
Trichomonas: NEGATIVE

## 2022-06-01 DIAGNOSIS — E119 Type 2 diabetes mellitus without complications: Secondary | ICD-10-CM | POA: Diagnosis not present

## 2022-06-01 LAB — HM DIABETES EYE EXAM

## 2022-06-11 MED ORDER — MICONAZOLE NITRATE 200 MG VA SUPP: 200.0000 mg | Freq: Every day | VAGINAL | 0 refills | Status: DC

## 2022-06-11 NOTE — Addendum Note (Signed)
Addended by: Kerri Perches on: 06/11/2022 08:57 AM   Modules accepted: Orders

## 2022-06-15 ENCOUNTER — Telehealth: Payer: Self-pay | Admitting: Family Medicine

## 2022-06-15 ENCOUNTER — Other Ambulatory Visit: Payer: Self-pay | Admitting: Family Medicine

## 2022-06-15 MED ORDER — TERCONAZOLE 0.4 % VA CREA: 1.0000 | TOPICAL_CREAM | Freq: Every day | VAGINAL | 0 refills | Status: DC

## 2022-06-15 NOTE — Telephone Encounter (Signed)
Pt called in regard to miconazole (MICOTIN) 200 MG vaginal suppository  Not covered by insurance   Needs Terconazole Sent in, covered by insurance

## 2022-06-20 ENCOUNTER — Ambulatory Visit (INDEPENDENT_AMBULATORY_CARE_PROVIDER_SITE_OTHER): Payer: PPO | Admitting: Adult Health

## 2022-06-20 ENCOUNTER — Encounter: Payer: Self-pay | Admitting: Adult Health

## 2022-06-20 VITALS — BP 138/60 | HR 53 | Ht <= 58 in | Wt 158.0 lb

## 2022-06-20 DIAGNOSIS — L900 Lichen sclerosus et atrophicus: Secondary | ICD-10-CM

## 2022-06-20 DIAGNOSIS — R3915 Urgency of urination: Secondary | ICD-10-CM | POA: Diagnosis not present

## 2022-06-20 DIAGNOSIS — Z1330 Encounter for screening examination for mental health and behavioral disorders, unspecified: Secondary | ICD-10-CM

## 2022-06-20 DIAGNOSIS — N898 Other specified noninflammatory disorders of vagina: Secondary | ICD-10-CM

## 2022-06-20 LAB — POCT URINALYSIS DIPSTICK
Blood, UA: NEGATIVE
Glucose, UA: POSITIVE — AB
Ketones, UA: NEGATIVE
Leukocytes, UA: NEGATIVE
Nitrite, UA: NEGATIVE
Protein, UA: NEGATIVE

## 2022-06-20 MED ORDER — CLOBETASOL PROPIONATE 0.05 % EX OINT: TOPICAL_OINTMENT | CUTANEOUS | 3 refills | Status: DC

## 2022-06-20 NOTE — Progress Notes (Signed)
Subjective:     Patient ID: Amy Houston, female   DOB: [birth year], 77 y.o.   MRN: [id number]  HPI Amy Houston is a 77 year old white female,married, sp hysterectomy in complaining of recurrent vaginal itching and urinary urgency, wonders if bladder dropped. +yeast on CV swab using terazol cream.  PCP is Dr Lodema Hong.  Review of Systems Vaginal itching Urinary urgency Has sex very rarely Reviewed past medical,surgical, social and family history. Reviewed medications and allergies.     Objective:   Physical Exam BP 138/60 (BP Location: Right Arm, Patient Position: Sitting, Cuff Size: Normal)   Pulse (!) 53   Ht 4\' 10"  (1.473 m)   Wt 158 lb (71.7 kg)   BMI 33.02 kg/m  urine dipstick: 500 glucose  Skin warm and dry.Pelvic: external genitalia:vulva thickened, vagina is pale, +cream in vault, no cystocele noted,urethra has no lesions or masses noted, cervix and uterus absent, adnexa: no masses or tenderness noted. Bladder is non tender and no masses felt. On rectal exam has +hemorrhoids and mild rectocele.   AA is 0 Fall risk is moderate    06/20/2022   11:11 AM 05/24/2022    4:13 PM 03/27/2022    4:34 PM  Depression screen PHQ 2/9  Decreased Interest 0 0 0  Down, Depressed, Hopeless 0 0 0  PHQ - 2 Score 0 0 0  Altered sleeping 0  0  Tired, decreased energy 1  1  Change in appetite 0  0  Feeling bad or failure about yourself  0  0  Trouble concentrating 0  0  Moving slowly or fidgety/restless 0  0  Suicidal thoughts 0  0  PHQ-9 Score 1  1       06/20/2022   11:11 AM 03/27/2022    4:34 PM 03/15/2022   11:09 AM 01/06/2022    9:35 AM  GAD 7 : Generalized Anxiety Score  Nervous, Anxious, on Edge 0 0 0 0  Control/stop worrying 0 0 0 0  Worry too much - different things 0 0 0 0  Trouble relaxing 0 0 0 0  Restless 0 0 0 0  Easily annoyed or irritable 0 1 1 1   Afraid - awful might happen 0 0 0 0  Total GAD 7 Score 0 1 1 1   Anxiety Difficulty   Somewhat difficult Somewhat difficult       Upstream - 06/20/22 1126       Pregnancy Intention Screening   Does the patient want to become pregnant in the next year? N/A    Does the patient's partner want to become pregnant in the next year? N/A    Would the patient like to discuss contraceptive options today? N/A      Contraception Wrap Up   Current Method Female Sterilization   hyst   End Method Female Sterilization   hyst   Contraception Counseling Provided No            Examination chaperoned by Malachy Mood LPN  Assessment:     1. Itching in the vaginal area  2. Lichen sclerosus et atrophicus Meds ordered this encounter  Medications   clobetasol ointment (TEMOVATE) 0.05 %    Sig: Apply 2 x daily to vulva area for 4 weeks then 2-3 x weekly    Dispense:  30 g    Refill:  3    Order Specific Question:   Supervising Provider    Answer:   Lazaro Arms [[id number]]   Review  handout on LS  I showed her pictures in [facility]   3. Urinary urgency Drink more water Decrease caffeine - POCT Urinalysis Dipstick     Plan:     Follow up about 08/03/22 with me

## 2022-07-07 ENCOUNTER — Other Ambulatory Visit: Payer: Self-pay | Admitting: Family Medicine

## 2022-08-03 ENCOUNTER — Ambulatory Visit: Payer: PPO | Admitting: Adult Health

## 2022-08-14 ENCOUNTER — Other Ambulatory Visit: Payer: Self-pay | Admitting: Family Medicine

## 2022-08-14 ENCOUNTER — Ambulatory Visit (HOSPITAL_COMMUNITY)
Admission: RE | Admit: 2022-08-14 | Discharge: 2022-08-14 | Disposition: A | Discharge status: Home / Self Care | Payer: PPO | Source: Ambulatory Visit | Attending: Hematology | Admitting: Hematology

## 2022-08-14 DIAGNOSIS — Z170 Estrogen receptor positive status [ER+]: Secondary | ICD-10-CM | POA: Insufficient documentation

## 2022-08-14 DIAGNOSIS — C50111 Malignant neoplasm of central portion of right female breast: Secondary | ICD-10-CM | POA: Insufficient documentation

## 2022-08-14 DIAGNOSIS — Z853 Personal history of malignant neoplasm of breast: Secondary | ICD-10-CM | POA: Diagnosis not present

## 2022-08-14 MED ORDER — GADOBUTROL 1 MMOL/ML IV SOLN
7.0000 mL | Freq: Once | INTRAVENOUS | Status: AC | PRN
  Administered 2022-08-14: 7 mL via INTRAVENOUS

## 2022-08-16 ENCOUNTER — Other Ambulatory Visit: Payer: Self-pay

## 2022-08-16 DIAGNOSIS — C50111 Malignant neoplasm of central portion of right female breast: Secondary | ICD-10-CM

## 2022-08-17 ENCOUNTER — Inpatient Hospital Stay: Payer: PPO | Attending: Hematology

## 2022-08-17 ENCOUNTER — Encounter: Payer: Self-pay | Admitting: Pharmacist

## 2022-08-17 DIAGNOSIS — C50911 Malignant neoplasm of unspecified site of right female breast: Secondary | ICD-10-CM | POA: Insufficient documentation

## 2022-08-17 DIAGNOSIS — Z79811 Long term (current) use of aromatase inhibitors: Secondary | ICD-10-CM | POA: Diagnosis not present

## 2022-08-17 DIAGNOSIS — Z170 Estrogen receptor positive status [ER+]: Secondary | ICD-10-CM | POA: Insufficient documentation

## 2022-08-17 LAB — COMPREHENSIVE METABOLIC PANEL
ALT: 18 U/L (ref 0–44)
AST: 18 U/L (ref 15–41)
Albumin: 4.2 g/dL (ref 3.5–5.0)
Alkaline Phosphatase: 67 U/L (ref 38–126)
Anion gap: 9 (ref 5–15)
BUN: 22 mg/dL (ref 8–23)
CO2: 25 mmol/L (ref 22–32)
Calcium: 9.4 mg/dL (ref 8.9–10.3)
Chloride: 106 mmol/L (ref 98–111)
Creatinine, Ser: 0.96 mg/dL (ref 0.44–1.00)
GFR, Estimated: >60 mL/min (ref >60)
Glucose, Bld: 151 mg/dL — ABNORMAL HIGH (ref 70–99)
Potassium: 4 mmol/L (ref 3.5–5.1)
Sodium: 140 mmol/L (ref 135–145)
Total Bilirubin: 0.9 mg/dL (ref 0.3–1.2)
Total Protein: 7.4 g/dL (ref 6.5–8.1)

## 2022-08-17 LAB — CBC WITH DIFFERENTIAL/PLATELET
Abs Immature Granulocytes: 0.05 10*3/uL (ref 0.00–0.07)
Basophils Absolute: 0 10*3/uL (ref 0.0–0.1)
Basophils Relative: 1 %
Eosinophils Absolute: 0.1 10*3/uL (ref 0.0–0.5)
Eosinophils Relative: 2 %
HCT: 40.6 % (ref 36.0–46.0)
Hemoglobin: 13.3 g/dL (ref 12.0–15.0)
Immature Granulocytes: 1 %
Lymphocytes Relative: 30 %
Lymphs Abs: 2.5 10*3/uL (ref 0.7–4.0)
MCH: 31.5 pg (ref 26.0–34.0)
MCHC: 32.8 g/dL (ref 30.0–36.0)
MCV: 96.2 fL (ref 80.0–100.0)
Monocytes Absolute: 0.6 10*3/uL (ref 0.1–1.0)
Monocytes Relative: 7 %
Neutro Abs: 4.9 10*3/uL (ref 1.7–7.7)
Neutrophils Relative %: 59 %
Platelets: 294 10*3/uL (ref 150–400)
RBC: 4.22 MIL/uL (ref 3.87–5.11)
RDW: 12.9 % (ref 11.5–15.5)
WBC: 8.1 10*3/uL (ref 4.0–10.5)
nRBC: 0 % (ref 0.0–0.2)

## 2022-08-17 LAB — VITAMIN D 25 HYDROXY (VIT D DEFICIENCY, FRACTURES): Vit D, 25-Hydroxy: 51.4 ng/mL (ref 30–100)

## 2022-08-17 NOTE — Progress Notes (Addendum)
Adherence check-in. Appears adherent with recent refill for empagliflozin 30 day supply - 08/14/22, refill for lisinopril 90 day supply - 05/24/22, and refill for rosuvastatin 90 day supply - 05/24/22.  Not [facility] Patient.

## 2022-08-21 ENCOUNTER — Ambulatory Visit: Payer: PPO | Admitting: Adult Health

## 2022-08-21 ENCOUNTER — Encounter: Payer: Self-pay | Admitting: Adult Health

## 2022-08-21 VITALS — BP 132/56 | HR 57 | Ht <= 58 in | Wt 158.0 lb

## 2022-08-21 DIAGNOSIS — L900 Lichen sclerosus et atrophicus: Secondary | ICD-10-CM

## 2022-08-21 DIAGNOSIS — N898 Other specified noninflammatory disorders of vagina: Secondary | ICD-10-CM

## 2022-08-21 NOTE — Progress Notes (Signed)
  Subjective:     Patient ID: Amy Houston, female   DOB: 04-25-1945, 77 y.o.   MRN: [id number]  HPI [name] is a 77 year old white female, married, sp hysterectomy back in follow up on itching in vaginal area and started on temovate for lichen sclerosus and is much better, not using temovate as often.  PCP is Dr Lodema Hong.  Review of Systems Itching resolved Reviewed past medical,surgical, social and family history. Reviewed medications and allergies.     Objective:   Physical Exam BP (!) 132/56 (BP Location: Left Arm, Patient Position: Sitting, Cuff Size: Normal)   Pulse (!) 57   Ht 4\' 10"  (1.473 m)   Wt 158 lb (71.7 kg)   BMI 33.02 kg/m     Skin warm and dry.Pelvic: external genitalia is normal in appearance no lesions,vulva skin is less thickened. Examination chaperoned by Malachy Mood LPN  [facility] - 08/21/22 1105       Pregnancy Intention Screening   Does the patient want to become pregnant in the next year? N/A    Does the patient's partner want to become pregnant in the next year? N/A    Would the patient like to discuss contraceptive options today? N/A      Contraception Wrap Up   Current Method Female Sterilization   hyst   End Method Female Sterilization   hyst   Contraception Counseling Provided No             Assessment:     1. Lichen sclerosus et atrophicus Vulva skin is less thickened Continue temovate prn, has refills   2. Itching in the vaginal area Has resolved    Plan:     Follow up in 6 months or sooner if needed

## 2022-08-22 DIAGNOSIS — L821 Other seborrheic keratosis: Secondary | ICD-10-CM | POA: Diagnosis not present

## 2022-08-24 ENCOUNTER — Inpatient Hospital Stay: Payer: PPO | Admitting: Hematology

## 2022-09-05 ENCOUNTER — Inpatient Hospital Stay: Payer: PPO | Attending: Hematology | Admitting: Hematology

## 2022-09-05 ENCOUNTER — Other Ambulatory Visit: Payer: Self-pay | Admitting: Medical Genetics

## 2022-09-05 ENCOUNTER — Telehealth: Payer: Self-pay | Admitting: Family Medicine

## 2022-09-05 VITALS — BP 149/58 | HR 57 | Temp 98.3°F | Resp 16 | Wt 159.0 lb

## 2022-09-05 DIAGNOSIS — Z9013 Acquired absence of bilateral breasts and nipples: Secondary | ICD-10-CM | POA: Diagnosis not present

## 2022-09-05 DIAGNOSIS — Z853 Personal history of malignant neoplasm of breast: Secondary | ICD-10-CM | POA: Diagnosis not present

## 2022-09-05 DIAGNOSIS — C50111 Malignant neoplasm of central portion of right female breast: Secondary | ICD-10-CM | POA: Diagnosis not present

## 2022-09-05 DIAGNOSIS — Z08 Encounter for follow-up examination after completed treatment for malignant neoplasm: Secondary | ICD-10-CM | POA: Insufficient documentation

## 2022-09-05 DIAGNOSIS — Z1501 Genetic susceptibility to malignant neoplasm of breast: Secondary | ICD-10-CM | POA: Insufficient documentation

## 2022-09-05 DIAGNOSIS — Z170 Estrogen receptor positive status [ER+]: Secondary | ICD-10-CM

## 2022-09-05 DIAGNOSIS — Z006 Encounter for examination for normal comparison and control in clinical research program: Secondary | ICD-10-CM

## 2022-09-05 NOTE — Patient Instructions (Signed)
[facility] at [facility] Discharge Instructions   You were seen and examined today by Dr. Ellin Saba.  He reviewed the results of your lab work and MRI which are normal.  We will see you back in 1 year. We will repeat lab work and MRI prior to this visit.  Return as scheduled    Thank you for choosing [facility] at [facility] to provide your oncology and hematology care.  To afford each patient quality time with our provider, please arrive at least 15 minutes before your scheduled appointment time.   If you have a lab appointment with the Cancer Center please come in thru the Main Entrance and check in at the main information desk.  You need to re-schedule your appointment should you arrive 10 or more minutes late.  We strive to give you quality time with our providers, and arriving late affects you and other patients whose appointments are after yours.  Also, if you no show three or more times for appointments you may be dismissed from the clinic at the providers discretion.     Again, thank you for choosing [facility].  Our hope is that these requests will decrease the amount of time that you wait before being seen by our physicians.       _____________________________________________________________  Should you have questions after your visit to [facility], please contact our office at [phone] and follow the prompts.  Our office hours are 8:00 a.m. and 4:30 p.m. Monday - Friday.  Please note that voicemails left after 4:00 p.m. may not be returned until the following business day.  We are closed weekends and major holidays.  You do have access to a nurse 24-7, just call the main number to the clinic [phone] and do not press any options, hold on the line and a nurse will answer the phone.    For prescription refill requests, have your pharmacy contact our office and allow 72 hours.    Due to Covid, you  will need to wear a mask upon entering the hospital. If you do not have a mask, a mask will be given to you at the Main Entrance upon arrival. For doctor visits, patients may have 1 support person age 57 or older with them. For treatment visits, patients can not have anyone with them due to social distancing guidelines and our immunocompromised population.

## 2022-09-05 NOTE — Telephone Encounter (Signed)
Patient called in regard to lab orders.  Patient has recently had labs done with Oncology, wants to know if PCP still needs her to have labs done.  Pt has an appt 9/6 wants a call back

## 2022-09-05 NOTE — Progress Notes (Signed)
Amy [facility] [address] [place] [address]    Clinic Day:  09/05/2022  Referring physician: Kerri Perches, MD  Patient Care Team: Amy Perches, MD as PCP - General (Family Medicine) Amy Peers, MD as Consulting Physician (Hematology and Oncology) Amy Bali, MD (Inactive) as Attending Physician (Gastroenterology) Amy Bal, DO as Consulting Physician (Gastroenterology)   ASSESSMENT & PLAN:   Assessment: 1.  Stage I (T1CN0) right breast IDC: - Pathology: 1.2 cm, grade 2, ER/PR positive, HER2 negative, positive LVI, Ki-67 60%, 0/4 lymph nodes involved, Oncotype DX 14. - Status post bilateral mastectomy on 05/08/2012, bilateral salpingo-oophorectomy on 01/30/2013 - PALB2 and CHEK2 mutation positive - Aromasin started on 06/03/2012, switched to Femara, discontinued in May 2024.  Plan: 1.  Stage I right breast cancer: - Bilateral mastectomy sites are within normal limits.  No palpable adenopathy. - Femara was discontinued in May 2024. - Labs from 08/17/2022: Normal LFTs.  CBC grossly normal. - Breast MRI from 08/14/2022: BI-RADS Category 1. - RTC 1 year for follow-up with repeat breast MRI.   2.  Osteoporosis: - Received Prolia until 01/15/2020 and decided not to continue it for unrelated jaw pain. - DEXA scan on 08/09/2020 with T-score -2.1. - Vitamin D level is 51.  Continue calcium and vitamin D supplements.  Orders Placed This Encounter  Procedures   MR BREAST BILATERAL W WO CONTRAST INC CAD    Standing Status:   Future    Standing Expiration Date:   09/05/2023    Order Specific Question:   If indicated for the ordered procedure, I authorize the administration of contrast media per Radiology protocol    Answer:   Yes    Order Specific Question:   What is the patient's sedation requirement?    Answer:   No Sedation    Order Specific Question:   Does the patient have a pacemaker or implanted devices?    Answer:   No    Order  Specific Question:   Preferred imaging location?    Answer:   [facility] (table limit - 550lbs)   CBC with Differential    Standing Status:   Future    Standing Expiration Date:   09/05/2023   Comprehensive metabolic panel    Standing Status:   Future    Standing Expiration Date:   09/05/2023   VITAMIN D 25 Hydroxy (Vit-D Deficiency, Fractures)    Standing Status:   Future    Standing Expiration Date:   09/05/2023      Amy [facility],acting as a scribe for Amy Massed, MD.,have documented all relevant documentation on the behalf of Amy Massed, MD,as directed by  Amy Massed, MD while in the presence of Amy Massed, MD.   I, Amy Massed MD, have reviewed the above documentation for accuracy and completeness, and I agree with the above. Hello  Amy Massed, MD   9/3/20245:17 PM  CHIEF COMPLAINT:   Diagnosis: Breast cancer, right breast, IDC, Multifocal   Cancer Staging  Breast cancer, right breast, IDC, Multifocal Staging form: Breast, AJCC 7th Edition - Clinical: No stage assigned - Unsigned - Pathologic: Stage IA (T1c, N0, cM0) - Signed by Amy Paris, MD on 05/13/2012    Prior Therapy: Bilateral mastectomy  Current Therapy:  Femara   HISTORY OF PRESENT ILLNESS:   Oncology History  Breast cancer, right breast, IDC, Multifocal  03/19/2012 Initial Diagnosis   Breast cancer, right breast, IDC, Multifocal  05/08/2012 Surgery   Bilateral simple mastectomies by Amy [facility]   06/03/2012 -  Chemotherapy   Aromatase inhibitor.  PALB2 and CHEK2+ and therefore would benefit from lifelong AI therapy.   01/30/2013 Surgery   Laparotomy, minimal lysis of adhesions, abdominal hysterectomy, bilateral salpingo-oophorectomy.      INTERVAL HISTORY:   Amy Houston is a 77 y.o. female presenting to clinic today for follow up of right breast cancer. She was last seen by me on 08/18/21.  Since her last visit, she underwent  bilateral breast MRI on 08/14/22 that found: no evidence of malignancy.   Today, she states that she is doing well overall. Her appetite level is at 100%. Her energy level is at 75%.  She denies any new medical issues or pain. She is no longer taking Femara and stopped in May 2024. She is taking Calcium and Vitamin D as prescribed.   PAST MEDICAL HISTORY:   Past Medical History: Past Medical History:  Diagnosis Date   Abnormal genetic test 04/09/2013   PALB2 and CHEK2 positivity    Arthritis    "of the spine" (05/08/2012)   Breast cancer (HCC) 1995   "left" (05/08/2012)   Breast cancer, right breast, IDC, Multifocal 03/12/2012   Multifocal, 10 and 8 left breast    Depression, major, single episode, severe (HCC) 04/21/2021   Diabetes mellitus 1999   "dx; never on meds" (05/08/2012)   Diabetes mellitus without complication (HCC) 01/08/2019   Essential hypertension, benign 01/08/2019   Exertional shortness of breath    Family history of thyroid cancer    GERD (gastroesophageal reflux disease)    tums   H/O hiatal hernia    Hemorrhoid    History of blood transfusion    post child birth   Hyperlipidemia    IBS (irritable bowel syndrome)    Osteopenia 12/11/2012   On Ca++ and Vit D. Bone density in April 2014.  Next bone density due in April 2016.   PONV (postoperative nausea and vomiting)    PPD positive, treated 1999    Surgical History: Past Surgical History:  Procedure Laterality Date   ABDOMINAL HYSTERECTOMY Bilateral 01/30/2013   Procedure: HYSTERECTOMY ABDOMINAL;  Surgeon: Amy Plume, MD;  Location: [facility];  Service: Gynecology;  Laterality: Bilateral;   BREAST BIOPSY Left 1995   BREAST BIOPSY Bilateral 03/2012   "one on the left; 2 on the right" (05/08/2012)   BREAST LUMPECTOMY Left 1995   CHOLECYSTECTOMY  1992   COLONOSCOPY  12/06/93   Dr. Hayes:single diminutive polyp of the descending colon/extrernal hemorrhoids, benign path   COLONOSCOPY  11/30/98   Dr Amy [facility]:small  rectal polyp/internal hemorrhoids, benign path   COLONOSCOPY  11/26/2006   Dr. Hayes:sigmoid polyp/small interal hemorrhoids, adenomatous   COLONOSCOPY N/A 03/30/2016   Dr. Darrick [facility]: normal TI, 4 mm rectal polyp, multiple small and large-mouthed diverticula in rectosigmoid and sigmoid colon, non-bleeding internal hemorrhoids, tubular adenoma on path. surveillance in 5 yeras   COLONOSCOPY WITH ESOPHAGOGASTRODUODENOSCOPY (EGD) N/A 07/01/2012   Dr. Darrick [facility]: 2 small adenomas, sigmoid colon diverticula, surveillance in 2019   DILATION AND CURETTAGE OF UTERUS  1968   ESOPHAGEAL DILATION  07/01/2012   Dr. Darrick [facility]: proximal esophageal web, Schatzki's ring at [employer] junction, s/p dilaiton. Mild non-erosive gastritis. Repeat EGD July 23, 2012.    ESOPHAGOGASTRODUODENOSCOPY  1994   Dr. Matthias Hughs: small hiatal hernia and Schatzki's ring widely patent, CLO test negative   ESOPHAGOGASTRODUODENOSCOPY (EGD) WITH ESOPHAGEAL DILATION N/A 07/23/2012   Dr. Fields:proximal esophageal web v.  radiation induced stricture/schatzki ring/small HH, s/p dilation   LEFT HEART CATH AND CORONARY ANGIOGRAPHY N/A 03/16/2020   Procedure: LEFT HEART CATH AND CORONARY ANGIOGRAPHY;  Surgeon: Yates Decamp, MD;  Location: [facility];  Service: Cardiovascular;  Laterality: N/A;   MASTECTOMY COMPLETE / SIMPLE Left 05/08/2012   MASTECTOMY COMPLETE / SIMPLE W/ SENTINEL NODE BIOPSY Right 05/08/2012   SALPINGOOPHORECTOMY Bilateral 01/30/2013   Procedure: SALPINGO OOPHORECTOMY;  Surgeon: Amy Plume, MD;  Location: [facility];  Service: Gynecology;  Laterality: Bilateral;   SIMPLE MASTECTOMY WITH AXILLARY SENTINEL NODE BIOPSY Right 05/08/2012   Procedure: RIGHT TOTAL MASTECTOMY WITH AXILLARY SENTINEL NODE BIOPSY;  Surgeon: Amy Paris, MD;  Location: [facility] OR;  Service: General;  Laterality: Right;   SIMPLE MASTECTOMY WITH AXILLARY SENTINEL NODE BIOPSY Left 05/08/2012   Procedure: LEFT TOTAL MASTECTOMY;  Surgeon: Amy Paris, MD;  Location: [facility]  OR;  Service: General;  Laterality: Left;   TONSILLECTOMY AND ADENOIDECTOMY  ~ 2008   TUBAL LIGATION  1976   UVULOPALATOPHARYNGOPLASTY, TONSILLECTOMY AND SEPTOPLASTY  ~ 2008    Social History: Social History   Socioeconomic History   Marital status: Married    Spouse name: Interior and spatial designer   Number of children: 2   Years of education: 12th grade   Highest education level: Not on file  Occupational History   Occupation: retired    Associate Professor: [employer]  Tobacco Use   Smoking status: Never   Smokeless tobacco: Never  Vaping Use   Vaping status: Never Used  Substance and Sexual Activity   Alcohol use: Not Currently    Comment: rare    Drug use: No   Sexual activity: Yes    Birth control/protection: Surgical    Comment: hyst  Other Topics Concern   Not on file  Social History Narrative   Married 56 yrs.Lives with her husband.Retired.   Social Determinants of Health   Financial Resource Strain: Low Risk  (06/20/2022)   Overall Financial Resource Strain (CARDIA)    Difficulty of Paying Living Expenses: Not very hard  Food Insecurity: No Food Insecurity (06/20/2022)   Hunger Vital Sign    Worried About Running Out of Food in the Last Year: Never true    Ran Out of Food in the Last Year: Never true  Transportation Needs: No Transportation Needs (06/20/2022)   PRAPARE - Administrator, Civil Service (Medical): No    Lack of Transportation (Non-Medical): No  Physical Activity: Inactive (06/20/2022)   Exercise Vital Sign    Days of Exercise per Week: 0 days    Minutes of Exercise per Session: 0 min  Stress: No Stress Concern Present (06/20/2022)   [employer] of Occupational Health - Occupational Stress Questionnaire    Feeling of Stress : Only a little  Social Connections: Socially Integrated (06/20/2022)   Social Connection and Isolation Panel [NHANES]    Frequency of Communication with Friends and Family: More than three times a week    Frequency of Social  Gatherings with Friends and Family: More than three times a week    Attends Religious Services: More than 4 times per year    Active Member of [employer] or Organizations: Yes    Attends Banker Meetings: 1 to 4 times per year    Marital Status: Married  Catering manager Violence: Not At Risk (06/20/2022)   Humiliation, Afraid, Rape, and Kick questionnaire    Fear of Current or Ex-Partner: No    Emotionally  Abused: No    Physically Abused: No    Sexually Abused: No    Family History: Family History  Problem Relation Age of Onset   Lung cancer Maternal Grandmother        smoker   Depression Father    Leukemia Father 87       Hairy Cell at 42, CLL at 41   Lymphoma Father 22       Hodgkins lymphoma   Lung cancer Mother 61       smoker   Thyroid cancer Brother 18       Medullary   Lymphoma Brother 37       Follicular lymphoma   Lung cancer Sister 68   Cancer Daughter        thyroid   Other Daughter        leiomyoma of esophagus   Thyroid cancer Daughter    Cancer Daughter        breast   Breast cancer Daughter    Breast cancer Maternal Aunt        mother's paternal half sister   Colon cancer Maternal Aunt        mother's maternal half sister   Lung cancer Maternal Uncle        mother's paternal half brother   Cancer Paternal Uncle        oral cancer dx in his 52s   Lung cancer Cousin        paternal cousin   Breast cancer Cousin        paternal cousin died in her 100s    Current Medications:  Current Outpatient Medications:    acetaminophen (TYLENOL) 500 MG tablet, Take 1,000 mg by mouth daily as needed for moderate pain or headache. , Disp: , Rfl:    aspirin EC 81 MG tablet, Take 81 mg by mouth daily. Swallow whole., Disp: , Rfl:    blood glucose meter kit and supplies, Dispense based on patient and insurance preference. Once daily testing DX E11.9, Disp: 1 each, Rfl: 0   Chlorphen-PE-Acetaminophen (NOREL AD) 4-10-325 MG TABS, Take 1 tablet by mouth 3  (three) times daily as needed (Nasal congestion)., Disp: 30 tablet, Rfl: 0   Cholecalciferol (VITAMIN D) 2000 units CAPS, Take 6,000 Units by mouth at bedtime. , Disp: , Rfl:    clobetasol ointment (TEMOVATE) 0.05 %, Apply 2 x daily to vulva area for 4 weeks then 2-3 x weekly, Disp: 30 g, Rfl: 3   fluticasone (FLONASE) 50 MCG/ACT nasal spray, Place 1 spray into both nostrils daily as needed for allergies or rhinitis., Disp: 16 g, Rfl: 2   glucose blood test strip, Use as instructed daily dx e11.9, Disp: 100 each, Rfl: 12   JARDIANCE 25 MG TABS tablet, Take 1 tablet by mouth daily before breakfast., Disp: 30 tablet, Rfl: 0   Lancets 30G MISC, Once daily testing dx e11.9, Disp: 100 each, Rfl: 5   lisinopril (ZESTRIL) 40 MG tablet, Take 1 tablet (40 mg total) by mouth daily., Disp: 90 tablet, Rfl: 3   metoprolol succinate (TOPROL-XL) 100 MG 24 hr tablet, TAKE 1 TABLET BY MOUTH ONCE A DAY., Disp: 90 tablet, Rfl: 0   Misc Natural Products (TART CHERRY ADVANCED PO), Take by mouth. 1 daily, Disp: , Rfl:    Multiple Vitamin (MULTIVITAMIN WITH MINERALS) TABS, Take 1 tablet by mouth at bedtime. , Disp: , Rfl:    nitroGLYCERIN (NITROSTAT) 0.4 MG SL tablet, Place 0.4 mg under the tongue every  5 (five) minutes as needed for chest pain., Disp: , Rfl:    omeprazole (PRILOSEC) 20 MG capsule, TAKE ONE CAPSULE BY MOUTH ONCE DAILY., Disp: 90 capsule, Rfl: 3   ondansetron (ZOFRAN ODT) 4 MG disintegrating tablet, Take 1 tablet (4 mg total) by mouth every 8 (eight) hours as needed for nausea or vomiting., Disp: 20 tablet, Rfl: 0   rosuvastatin (CRESTOR) 10 MG tablet, Take 1 tablet (10 mg total) by mouth daily., Disp: 90 tablet, Rfl: 0   venlafaxine XR (EFFEXOR-XR) 75 MG 24 hr capsule, TAKE ONE CAPSULE BY MOUTH TWO TIMES DAILY, Disp: 60 capsule, Rfl: 0   Allergies: Allergies  Allergen Reactions   Moxifloxacin Hcl Rash    Perineal/ vulaval rash reported , responding to antifungal, likely yeast infection , of note pt on  jardiance  , not a true " allergic reaction"   Penicillins Rash    Has patient had a PCN reaction causing immediate rash, facial/tongue/throat swelling, SOB or lightheadedness with hypotension: Yes Has patient had a PCN reaction causing severe rash involving mucus membranes or skin necrosis: No Has patient had a PCN reaction that required hospitalization No Has patient had a PCN reaction occurring within the last 10 years: No If all of the above answers are "NO", then may proceed with Cephalosporin use.    Colchicine Other (See Comments)    Reports vomiting an diarrheah   Indomethacin Other (See Comments)    Reports vomiting and diarrheah    REVIEW OF SYSTEMS:   Review of Systems  Constitutional:  Negative for chills, fatigue and fever.  HENT:   Negative for lump/mass, mouth sores, nosebleeds, sore throat and trouble swallowing.   Eyes:  Negative for eye problems.  Respiratory:  Negative for cough and shortness of breath.   Cardiovascular:  Negative for chest pain, leg swelling and palpitations.  Gastrointestinal:  Negative for abdominal pain, constipation, diarrhea, nausea and vomiting.  Genitourinary:  Negative for bladder incontinence, difficulty urinating, dysuria, frequency, hematuria and nocturia.        +urinary urgency  Musculoskeletal:  Negative for arthralgias, back pain, flank pain, myalgias and neck pain.  Skin:  Negative for itching and rash.  Neurological:  Positive for numbness (in fingers). Negative for dizziness and headaches.  Hematological:  Does not bruise/bleed easily.  Psychiatric/Behavioral:  Negative for depression, sleep disturbance and suicidal ideas. The patient is not nervous/anxious.   All other systems reviewed and are negative.    VITALS:   Blood pressure (!) 149/58, pulse (!) 57, temperature 98.3 F (36.8 C), temperature source Oral, resp. rate 16, weight 159 lb (72.1 kg), SpO2 98%.  Wt Readings from Last 3 Encounters:  09/05/22 159 lb (72.1 kg)   08/21/22 158 lb (71.7 kg)  06/20/22 158 lb (71.7 kg)    Body mass index is 33.23 kg/m.  Performance status (ECOG): 1  PHYSICAL EXAM:   Physical Exam Vitals and nursing note reviewed. Exam conducted with a chaperone present.  Constitutional:      Appearance: Normal appearance.  Cardiovascular:     Rate and Rhythm: Normal rate and regular rhythm.     Pulses: Normal pulses.     Heart sounds: Normal heart sounds.  Pulmonary:     Effort: Pulmonary effort is normal.     Breath sounds: Normal breath sounds.  Chest:     Comments: +bilateral mastectomy sites WNL Abdominal:     Palpations: Abdomen is soft. There is no hepatomegaly, splenomegaly or mass.     Tenderness:  There is no abdominal tenderness.  Musculoskeletal:     Right lower leg: No edema.     Left lower leg: No edema.  Lymphadenopathy:     Cervical: No cervical adenopathy.     Right cervical: No superficial, deep or posterior cervical adenopathy.    Left cervical: No superficial, deep or posterior cervical adenopathy.     Upper Body:     Right upper body: No supraclavicular or axillary adenopathy.     Left upper body: No supraclavicular or axillary adenopathy.  Neurological:     General: No focal deficit present.     Mental Status: She is alert and oriented to person, place, and time.  Psychiatric:        Mood and Affect: Mood normal.        Behavior: Behavior normal.   Breast Exam Chaperone: Chapman Moss, RN   LABS:      Latest Ref Rng & Units 08/17/2022   11:02 AM 11/23/2021    9:20 AM 08/11/2021   10:41 AM  CBC  WBC 4.0 - 10.5 K/uL 8.1  7.2  7.0   Hemoglobin 12.0 - 15.0 g/dL 38.7  56.4  33.2   Hematocrit 36.0 - 46.0 % 40.6  38.4  38.4   Platelets 150 - 400 K/uL 294  307  236       Latest Ref Rng & Units 08/17/2022   11:02 AM 03/14/2022    9:55 AM 11/23/2021    9:20 AM  CMP  Glucose 70 - 99 mg/dL 951  884  166   BUN 8 - 23 mg/dL 22  19  20    Creatinine 0.44 - 1.00 mg/dL 0.63  0.16  0.10    Sodium 135 - 145 mmol/L 140  143  142   Potassium 3.5 - 5.1 mmol/L 4.0  4.3  4.6   Chloride 98 - 111 mmol/L 106  109  106   CO2 22 - 32 mmol/L 25  21  21    Calcium 8.9 - 10.3 mg/dL 9.4  9.8  9.4   Total Protein 6.5 - 8.1 g/dL 7.4   6.5   Total Bilirubin 0.3 - 1.2 mg/dL 0.9   0.4   Alkaline Phos 38 - 126 U/L 67   70   AST 15 - 41 U/L 18   16   ALT 0 - 44 U/L 18   15      No results found for: "CEA1", "CEA" / No results found for: "CEA1", "CEA" No results found for: "PSA1" No results found for: "XNA355" No results found for: "CAN125"  No results found for: "TOTALPROTELP", "ALBUMINELP", "A1GS", "A2GS", "BETS", "BETA2SER", "GAMS", "MSPIKE", "SPEI" No results found for: "TIBC", "FERRITIN", "IRONPCTSAT" No results found for: "LDH"   STUDIES:   MR BREAST BILATERAL W WO CONTRAST INC CAD  Result Date: 08/14/2022 CLINICAL DATA:  History right breast cancer post bilateral mastectomy 2014. PALB2 and CHEK2 gene mutations. EXAM: BILATERAL BREAST MRI WITH AND WITHOUT CONTRAST TECHNIQUE: Multiplanar, multisequence MR images of both breasts were obtained prior to and following the intravenous administration of 7 ml of Gadavist. Three-dimensional MR images were rendered by post-processing of the original MR data on an independent workstation. The three-dimensional MR images were interpreted, and findings are reported in the following complete MRI report for this study. Three dimensional images were evaluated at the independent interpreting workstation using the DynaCAD thin client. COMPARISON:  Previous exam(s). FINDINGS: Breast composition: a. Almost entirely fat. Background parenchymal enhancement: Minimal Right breast:  Evidence of previous right mastectomy. No suspicious masses or enhancement. Left breast: Evidence of previous left mastectomy. No suspicious masses or enhancement. Lymph nodes: No abnormal appearing lymph nodes. Ancillary findings:  None. IMPRESSION: Evidence of previous bilateral  mastectomy. No MRI evidence of malignancy. RECOMMENDATION: Recommend continued high risk screening breast MRI as clinically indicated. BI-RADS CATEGORY  1: Negative. Electronically Signed   By: Elberta Fortis M.D.   On: 08/14/2022 16:26

## 2022-09-06 ENCOUNTER — Other Ambulatory Visit: Payer: Self-pay

## 2022-09-06 DIAGNOSIS — E041 Nontoxic single thyroid nodule: Secondary | ICD-10-CM

## 2022-09-06 DIAGNOSIS — E785 Hyperlipidemia, unspecified: Secondary | ICD-10-CM | POA: Diagnosis not present

## 2022-09-06 DIAGNOSIS — I10 Essential (primary) hypertension: Secondary | ICD-10-CM | POA: Diagnosis not present

## 2022-09-06 DIAGNOSIS — E1159 Type 2 diabetes mellitus with other circulatory complications: Secondary | ICD-10-CM

## 2022-09-06 NOTE — Telephone Encounter (Signed)
Labs ordered.

## 2022-09-07 LAB — CMP14+EGFR
ALT: 16 IU/L (ref 0–32)
AST: 17 IU/L (ref 0–40)
Albumin: 4.1 g/dL (ref 3.8–4.8)
Alkaline Phosphatase: 78 IU/L (ref 44–121)
BUN/Creatinine Ratio: 23 (ref 12–28)
BUN: 21 mg/dL (ref 8–27)
Bilirubin Total: 0.3 mg/dL (ref 0.0–1.2)
CO2: 20 mmol/L (ref 20–29)
Calcium: 9.2 mg/dL (ref 8.7–10.3)
Chloride: 109 mmol/L — ABNORMAL HIGH (ref 96–106)
Creatinine, Ser: 0.92 mg/dL (ref 0.57–1.00)
Globulin, Total: 2.6 g/dL (ref 1.5–4.5)
Glucose: 147 mg/dL — ABNORMAL HIGH (ref 70–99)
Potassium: 4 mmol/L (ref 3.5–5.2)
Sodium: 143 mmol/L (ref 134–144)
Total Protein: 6.7 g/dL (ref 6.0–8.5)
eGFR: 65 mL/min/{1.73_m2} (ref >59)

## 2022-09-07 LAB — LIPID PANEL
Chol/HDL Ratio: 2.6 ratio (ref 0.0–4.4)
Cholesterol, Total: 113 mg/dL (ref 100–199)
HDL: 43 mg/dL (ref >39)
LDL Chol Calc (NIH): 49 mg/dL (ref 0–99)
Triglycerides: 119 mg/dL (ref 0–149)
VLDL Cholesterol Cal: 21 mg/dL (ref 5–40)

## 2022-09-07 LAB — TSH: TSH: 1.57 u[IU]/mL (ref 0.450–4.500)

## 2022-09-07 LAB — HEMOGLOBIN A1C
Est. average glucose Bld gHb Est-mCnc: 140 mg/dL
Hgb A1c MFr Bld: 6.5 % — ABNORMAL HIGH (ref 4.8–5.6)

## 2022-09-08 ENCOUNTER — Encounter: Payer: Self-pay | Admitting: Family Medicine

## 2022-09-08 ENCOUNTER — Ambulatory Visit (INDEPENDENT_AMBULATORY_CARE_PROVIDER_SITE_OTHER): Payer: PPO | Admitting: Family Medicine

## 2022-09-08 VITALS — BP 120/70 | HR 54 | Ht <= 58 in | Wt 159.0 lb

## 2022-09-08 DIAGNOSIS — E1159 Type 2 diabetes mellitus with other circulatory complications: Secondary | ICD-10-CM | POA: Diagnosis not present

## 2022-09-08 DIAGNOSIS — Z0001 Encounter for general adult medical examination with abnormal findings: Secondary | ICD-10-CM

## 2022-09-08 DIAGNOSIS — Z0000 Encounter for general adult medical examination without abnormal findings: Secondary | ICD-10-CM

## 2022-09-08 NOTE — Progress Notes (Unsigned)
    Amy Houston     MRN: [id number]      DOB: Dec 02, 1945  Chief Complaint  Patient presents with   Annual Exam    R-40 L-50 B-30   Immunizations    Flu already given     HPI: Patient is in for annual physical exam. No other health concerns are expressed or addressed at the visit. Recent labs,  are reviewed. Immunization is reviewed , and  updated if needed.   PE: Pleasant  female, alert and oriented x 3, in no cardio-pulmonary distress. Afebrile. HEENT No facial trauma or asymetry. Sinuses non tender.  Extra occullar muscles intact.. External ears normal, . Neck: supple, no adenopathy,JVD or thyromegaly.No bruits.  Chest: Clear to ascultation bilaterally.No crackles or wheezes. Non tender to palpation  Breast: No asymetry,no masses or lumps. No tenderness. No nipple discharge or inversion. No axillary or supraclavicular adenopathy  Cardiovascular system; Heart sounds normal,  S1 and  S2 ,no S3.  No murmur, or thrill. Apical beat not displaced Peripheral pulses normal.  Abdomen: Soft, non tender, no organomegaly or masses. No bruits. Bowel sounds normal. No guarding, tenderness or rebound.   GU: External genitalia normal female genitalia , normal female distribution of hair. No lesions. Urethral meatus normal in size, no  Prolapse, no lesions visibly  Present. Bladder non tender. Vagina pink and moist , with no visible lesions , discharge present . Adequate pelvic support no  cystocele or rectocele noted Cervix pink and appears healthy, no lesions or ulcerations noted, no discharge noted from os Uterus normal size, no adnexal masses, no cervical motion or adnexal tenderness.   Musculoskeletal exam: Full ROM of spine, hips , shoulders and knees. No deformity ,swelling or crepitus noted. No muscle wasting or atrophy.   Neurologic: Cranial nerves 2 to 12 intact. Power, tone ,sensation and reflexes normal throughout. No disturbance in gait. No  tremor.  Skin: Intact, no ulceration, erythema , scaling or rash noted. Pigmentation normal throughout  Psych; Normal mood and affect. Judgement and concentration normal   Assessment & Plan:  No problem-specific Assessment & Plan notes found for this encounter.

## 2022-09-08 NOTE — Patient Instructions (Addendum)
F/U in 4.5  to 5 months, call if you need me sooner   Congrats on excellent labs, bP and mental health   HBA1cC chem 7 and eGFr, non fasting 1  week before follow up  Please get samples  of jardiance for ptif we have on site, she is in [place], otherwise will need to go through [facility] pharmacy  It is important that you exercise regularly at least 30 minutes 5 times a week. If you develop chest pain, have severe difficulty breathing, or feel very tired, stop exercising immediately and seek medical attention    Thanks for choosing [facility], we consider it a privelige to serve you.

## 2022-09-11 ENCOUNTER — Encounter: Payer: Self-pay | Admitting: Family Medicine

## 2022-09-11 DIAGNOSIS — Z0000 Encounter for general adult medical examination without abnormal findings: Secondary | ICD-10-CM | POA: Insufficient documentation

## 2022-09-11 NOTE — Assessment & Plan Note (Signed)
Annual exam as documented. . Immunization and cancer screening needs are specifically addressed at this visit.  

## 2022-09-11 NOTE — Assessment & Plan Note (Signed)
[name] is reminded of the importance of commitment to daily physical activity for 30 minutes or more, as able and the need to limit carbohydrate intake to 30 to 60 grams per meal to help with blood sugar control.   The need to take medication as prescribed, test blood sugar as directed, and to call between visits if there is a concern that blood sugar is uncontrolled is also discussed.   [name] is reminded of the importance of daily foot exam, annual eye examination, and good blood sugar, blood pressure and cholesterol control.     Latest Ref Rng & Units 09/06/2022    8:42 AM 08/17/2022   11:02 AM 05/24/2022    5:05 PM 03/14/2022    9:55 AM 11/23/2021    9:20 AM  Diabetic Labs  HbA1c 4.8 - 5.6 % 6.5    6.8  7.1   Micro/Creat Ratio 0 - 29 mg/g creat   10     Chol 100 - 199 mg/dL 161     096   HDL >04 mg/dL 43     41   Calc LDL 0 - 99 mg/dL 49     54   Triglycerides 0 - 149 mg/dL 540     981   Creatinine 0.57 - 1.00 mg/dL 1.91  4.78   2.95  6.21       09/08/2022    4:00 PM 09/05/2022   10:30 AM 08/21/2022   10:53 AM 06/20/2022   11:13 AM 05/24/2022    4:13 PM 03/15/2022   11:07 AM 02/09/2022    3:25 PM  BP/Weight  Systolic BP 120 149 132 138 126 124 129  Diastolic BP 70 58 56 60 51 70 72  Wt. (Lbs) 159 159 158 158 152 159.08 161  BMI 33.23 kg/m2 33.23 kg/m2 33.02 kg/m2 33.02 kg/m2 31.77 kg/m2 33.25 kg/m2 33.65 kg/m2      Latest Ref Rng & Units 06/01/2022   12:00 AM 04/21/2021   10:00 AM  Foot/eye exam completion dates  Eye Exam No Retinopathy No Retinopathy       Foot Form Completion   Done     This result is from an external source.      Controlled, no change in medication

## 2022-09-14 ENCOUNTER — Ambulatory Visit
Admission: RE | Admit: 2022-09-14 | Discharge: 2022-09-14 | Disposition: A | Discharge status: Home / Self Care | Payer: PPO | Source: Ambulatory Visit | Attending: Family Medicine | Admitting: Family Medicine

## 2022-09-14 VITALS — BP 160/72 | HR 62 | Temp 98.1°F | Resp 18

## 2022-09-14 DIAGNOSIS — Z1152 Encounter for screening for COVID-19: Secondary | ICD-10-CM | POA: Insufficient documentation

## 2022-09-14 DIAGNOSIS — J208 Acute bronchitis due to other specified organisms: Secondary | ICD-10-CM | POA: Insufficient documentation

## 2022-09-14 MED ORDER — ALBUTEROL SULFATE HFA 108 (90 BASE) MCG/ACT IN AERS: 2.0000 | INHALATION_SPRAY | RESPIRATORY_TRACT | 0 refills | Status: DC | PRN

## 2022-09-14 MED ORDER — PREDNISONE 20 MG PO TABS: 40.0000 mg | ORAL_TABLET | Freq: Every day | ORAL | 0 refills | Status: DC

## 2022-09-14 MED ORDER — PROMETHAZINE-DM 6.25-15 MG/5ML PO SYRP: 5.0000 mL | ORAL_SOLUTION | Freq: Four times a day (QID) | ORAL | 0 refills | Status: DC | PRN

## 2022-09-14 MED ORDER — IPRATROPIUM-ALBUTEROL 0.5-2.5 (3) MG/3ML IN SOLN
3.0000 mL | Freq: Once | RESPIRATORY_TRACT | Status: AC
  Administered 2022-09-14: 3 mL via RESPIRATORY_TRACT

## 2022-09-14 NOTE — ED Provider Notes (Signed)
[facility]    CSN: [id number] Arrival date & time: 09/14/22  1325      History   Chief Complaint No chief complaint on file.   HPI Amy Houston is a 77 y.o. female.   Patient presenting today with 1 day history of wheezing, sinus drainage, headache, chest congestion, fatigue, scratchy throat.  Denies chest pain, shortness of breath, abdominal pain, nausea vomiting or diarrhea.  No known sick contacts recently.  No known history of chronic pulmonary disease.  So far not tried anything over-the-counter for symptoms.    Past Medical History:  Diagnosis Date   Abnormal genetic test 04/09/2013   PALB2 and CHEK2 positivity    Arthritis    "of the spine" (05/08/2012)   Breast cancer (HCC) 1995   "left" (05/08/2012)   Breast cancer, right breast, IDC, Multifocal 03/12/2012   Multifocal, 10 and 8 left breast    Depression, major, single episode, severe (HCC) 04/21/2021   Diabetes mellitus 1999   "dx; never on meds" (05/08/2012)   Diabetes mellitus without complication (HCC) 01/08/2019   Essential hypertension, benign 01/08/2019   Exertional shortness of breath    Family history of thyroid cancer    GERD (gastroesophageal reflux disease)    tums   H/O hiatal hernia    Hemorrhoid    History of blood transfusion    post child birth   Hyperlipidemia    IBS (irritable bowel syndrome)    Osteopenia 12/11/2012   On Ca++ and Vit D. Bone density in April 2014.  Next bone density due in April 2016.   PONV (postoperative nausea and vomiting)    PPD positive, treated 1999    Patient Active Problem List   Diagnosis Date Noted   Annual physical exam 09/11/2022   Lichen sclerosus et atrophicus 06/20/2022   Itching in the vaginal area 06/20/2022   Dysuria 05/28/2022   Vaginitis 05/28/2022   Scalp laceration, subsequent encounter 01/08/2022   Bruise, trunk, subsequent encounter 01/08/2022   Encounter for removal of staples 01/08/2022   Allergic rhinitis 01/08/2022    Coronary artery ectasia (HCC) 10/03/2021   MCI (mild cognitive impairment) 07/26/2021   FH: dementia 07/26/2021   Anxiety 05/22/2021   Positive PPD, treated 05/20/2021   Acute gout 05/06/2021   Gout 04/21/2021   Blood in the stool 12/16/2020   Long-term current use of bisphosphonate 08/12/2020   Dyspnea on exertion    Angina pectoris (HCC) 03/15/2020   Class 1 obesity with serious comorbidity and body mass index (BMI) of 33.0 to 33.9 in adult 09/04/2019   Family history of thyroid cancer 09/04/2019   Hypercholesteremia 09/04/2019   Vitamin D deficiency disease 07/24/2019   Primary hypertension 01/08/2019   Type 2 diabetes mellitus with vascular disease (HCC) 01/08/2019   Diverticulitis of colon 03/08/2015   Acquired trigger finger 05/06/2013   Abnormal genetic test 04/09/2013   Osteopenia 12/11/2012   Thyroid nodule 06/21/2012   Gastroesophageal reflux disease without esophagitis 06/03/2012   Adenomatous polyps 06/03/2012   Breast cancer, right breast, IDC, Multifocal 03/12/2012   Popliteal cyst 08/18/2010   Hx Left breast cancer, UOQ, IDC, receptor negative 11/08/1993    Past Surgical History:  Procedure Laterality Date   ABDOMINAL HYSTERECTOMY Bilateral 01/30/2013   Procedure: HYSTERECTOMY ABDOMINAL;  Surgeon: Bing Plume, MD;  Location: [facility];  Service: Gynecology;  Laterality: Bilateral;   BREAST BIOPSY Left 1995   BREAST BIOPSY Bilateral 03/2012   "one on the left; 2 on the right" (  05/08/2012)   BREAST LUMPECTOMY Left 1995   CHOLECYSTECTOMY  1992   COLONOSCOPY  12/06/93   Dr. Hayes:single diminutive polyp of the descending colon/extrernal hemorrhoids, benign path   COLONOSCOPY  11/30/98   Dr Hayes:small rectal polyp/internal hemorrhoids, benign path   COLONOSCOPY  11/26/2006   Dr. Hayes:sigmoid polyp/small interal hemorrhoids, adenomatous   COLONOSCOPY N/A 03/30/2016   Dr. Darrick Penna: normal TI, 4 mm rectal polyp, multiple small and large-mouthed diverticula in  rectosigmoid and sigmoid colon, non-bleeding internal hemorrhoids, tubular adenoma on path. surveillance in 5 yeras   COLONOSCOPY WITH ESOPHAGOGASTRODUODENOSCOPY (EGD) N/A 07/01/2012   Dr. Darrick Penna: 2 small adenomas, sigmoid colon diverticula, surveillance in 2019   DILATION AND CURETTAGE OF UTERUS  1968   ESOPHAGEAL DILATION  07/01/2012   Dr. Darrick Penna: proximal esophageal web, Schatzki's ring at [employer] junction, s/p dilaiton. Mild non-erosive gastritis. Repeat EGD July 23, 2012.    ESOPHAGOGASTRODUODENOSCOPY  1994   Dr. Matthias Hughs: small hiatal hernia and Schatzki's ring widely patent, CLO test negative   ESOPHAGOGASTRODUODENOSCOPY (EGD) WITH ESOPHAGEAL DILATION N/A 07/23/2012   Dr. Fields:proximal esophageal web v. radiation induced stricture/schatzki ring/small HH, s/p dilation   LEFT HEART CATH AND CORONARY ANGIOGRAPHY N/A 03/16/2020   Procedure: LEFT HEART CATH AND CORONARY ANGIOGRAPHY;  Surgeon: Yates Decamp, MD;  Location: [facility];  Service: Cardiovascular;  Laterality: N/A;   MASTECTOMY COMPLETE / SIMPLE Left 05/08/2012   MASTECTOMY COMPLETE / SIMPLE W/ SENTINEL NODE BIOPSY Right 05/08/2012   SALPINGOOPHORECTOMY Bilateral 01/30/2013   Procedure: SALPINGO OOPHORECTOMY;  Surgeon: Bing Plume, MD;  Location: [facility];  Service: Gynecology;  Laterality: Bilateral;   SIMPLE MASTECTOMY WITH AXILLARY SENTINEL NODE BIOPSY Right 05/08/2012   Procedure: RIGHT TOTAL MASTECTOMY WITH AXILLARY SENTINEL NODE BIOPSY;  Surgeon: Currie Paris, MD;  Location: [facility];  Service: General;  Laterality: Right;   SIMPLE MASTECTOMY WITH AXILLARY SENTINEL NODE BIOPSY Left 05/08/2012   Procedure: LEFT TOTAL MASTECTOMY;  Surgeon: Currie Paris, MD;  Location: [facility];  Service: General;  Laterality: Left;   TONSILLECTOMY AND ADENOIDECTOMY  ~ 2008   TUBAL LIGATION  1976   UVULOPALATOPHARYNGOPLASTY, TONSILLECTOMY AND SEPTOPLASTY  ~ 2008    OB History     Gravida  3   Para  2   Term  2   Preterm      AB  1    Living  2      SAB  1   IAB      Ectopic      Multiple      Live Births  2            Home Medications    Prior to Admission medications   Medication Sig Start Date End Date Taking? Authorizing Provider  albuterol (VENTOLIN HFA) 108 (90 Base) MCG/ACT inhaler Inhale 2 puffs into the lungs every 4 (four) hours as needed for wheezing or shortness of breath. 09/14/22  Yes Particia Nearing, PA-C  predniSONE (DELTASONE) 20 MG tablet Take 2 tablets (40 mg total) by mouth daily with breakfast. 09/14/22  Yes Particia Nearing, PA-C  promethazine-dextromethorphan (PROMETHAZINE-DM) 6.25-15 MG/5ML syrup Take 5 mLs by mouth 4 (four) times daily as needed. 09/14/22  Yes Particia Nearing, PA-C  acetaminophen (TYLENOL) 500 MG tablet Take 1,000 mg by mouth daily as needed for moderate pain or headache.     [provider]  aspirin EC 81 MG tablet Take 81 mg by mouth daily. Swallow whole.    [provider]  blood glucose meter kit and supplies Dispense based on patient and insurance preference. Once daily testing DX E11.9 05/06/21   Kerri Perches, MD  Chlorphen-PE-Acetaminophen (NOREL AD) 4-10-325 MG TABS Take 1 tablet by mouth 3 (three) times daily as needed (Nasal congestion). 10/20/21   Anabel Halon, MD  Cholecalciferol (VITAMIN D) 2000 units CAPS Take 6,000 Units by mouth at bedtime.     [provider]  clobetasol ointment (TEMOVATE) 0.05 % Apply 2 x daily to vulva area for 4 weeks then 2-3 x weekly 06/20/22   Adline Potter, NP  fluticasone (FLONASE) 50 MCG/ACT nasal spray Place 1 spray into both nostrils daily as needed for allergies or rhinitis. 10/20/21   Anabel Halon, MD  glucose blood test strip Use as instructed daily dx e11.9 05/06/21   Kerri Perches, MD  JARDIANCE 25 MG TABS tablet Take 1 tablet by mouth daily before breakfast. 08/14/22   Kerri Perches, MD  Lancets 30G MISC Once daily testing dx e11.9 05/06/21   Kerri Perches, MD  lisinopril (ZESTRIL) 40 MG tablet Take 1 tablet (40 mg total) by mouth daily. 04/21/21   Donell Beers, FNP  metoprolol succinate (TOPROL-XL) 100 MG 24 hr tablet TAKE 1 TABLET BY MOUTH ONCE A DAY. 03/09/22   Yates Decamp, MD  Misc Natural Products (TART CHERRY ADVANCED PO) Take by mouth. 1 daily    [provider]  Multiple Vitamin (MULTIVITAMIN WITH MINERALS) TABS Take 1 tablet by mouth at bedtime.     [provider]  nitroGLYCERIN (NITROSTAT) 0.4 MG SL tablet Place 0.4 mg under the tongue every 5 (five) minutes as needed for chest pain.    [provider]  omeprazole (PRILOSEC) 20 MG capsule TAKE ONE CAPSULE BY MOUTH ONCE DAILY. 06/01/21   Gelene Mink, NP  ondansetron (ZOFRAN ODT) 4 MG disintegrating tablet Take 1 tablet (4 mg total) by mouth every 8 (eight) hours as needed for nausea or vomiting. 02/14/21   Paseda, Baird Kay, FNP  rosuvastatin (CRESTOR) 10 MG tablet Take 1 tablet (10 mg total) by mouth daily. 03/09/22   Kerri Perches, MD  venlafaxine XR (EFFEXOR-XR) 75 MG 24 hr capsule TAKE ONE CAPSULE BY MOUTH TWO TIMES DAILY 08/14/22   Kerri Perches, MD    Family History Family History  Problem Relation Age of Onset   Lung cancer Mother 16       smoker   Depression Father    Leukemia Father 65       Hairy Cell at 65, CLL at 97   Lymphoma Father 15       Hodgkins lymphoma   Lung cancer Sister 52   Thyroid cancer Brother 78       Medullary   Lymphoma Brother 60       Follicular lymphoma   Cancer Daughter        thyroid   Other Daughter        leiomyoma of esophagus   Thyroid cancer Daughter    Cancer Daughter        breast   Breast cancer Daughter    Breast cancer Maternal Aunt        mother's paternal half sister   Colon cancer Maternal Aunt        mother's maternal half sister   Lung cancer Maternal Uncle        mother's paternal half brother   Cancer Paternal Uncle  oral cancer dx in his 40s   Lung cancer  Maternal Grandmother        smoker   Lung cancer Cousin        paternal cousin   Breast cancer Cousin        paternal cousin died in her 54s   Heart attack Nephew     Social History Social History   Tobacco Use   Smoking status: Never   Smokeless tobacco: Never  Vaping Use   Vaping status: Never Used  Substance Use Topics   Alcohol use: Not Currently    Comment: rare    Drug use: No     Allergies   Moxifloxacin hcl, Penicillins, Colchicine, and Indomethacin   Review of Systems Review of Systems Per HPI  Physical Exam Triage Vital Signs ED Triage Vitals  Encounter Vitals Group     BP 09/14/22 1404 (!) 160/72     Systolic BP Percentile --      Diastolic BP Percentile --      Pulse Rate 09/14/22 1404 62     Resp 09/14/22 1404 18     Temp 09/14/22 1404 98.1 F (36.7 C)     Temp Source 09/14/22 1404 Oral     SpO2 09/14/22 1404 93 %     Weight --      Height --      Head Circumference --      Peak Flow --      Pain Score 09/14/22 1403 0     Pain Loc --      Pain Education --      Exclude from Growth Chart --    No data found.  Updated Vital Signs BP (!) 160/72 (BP Location: Right Arm)   Pulse 62   Temp 98.1 F (36.7 C) (Oral)   Resp 18   SpO2 93%   Visual Acuity Right Eye Distance:   Left Eye Distance:   Bilateral Distance:    Right Eye Near:   Left Eye Near:    Bilateral Near:     Physical Exam Vitals and nursing note reviewed.  Constitutional:      Appearance: Normal appearance.  HENT:     Head: Atraumatic.     Right Ear: Tympanic membrane and external ear normal.     Left Ear: Tympanic membrane and external ear normal.     Nose: Rhinorrhea present.     Mouth/Throat:     Mouth: Mucous membranes are moist.     Pharynx: Posterior oropharyngeal erythema present.  Eyes:     Extraocular Movements: Extraocular movements intact.     Conjunctiva/sclera: Conjunctivae normal.  Cardiovascular:     Rate and Rhythm: Normal rate and regular  rhythm.     Heart sounds: Normal heart sounds.  Pulmonary:     Effort: Pulmonary effort is normal.     Breath sounds: Wheezing present. No rales.  Musculoskeletal:        General: Normal range of motion.     Cervical back: Normal range of motion and neck supple.  Skin:    General: Skin is warm and dry.  Neurological:     Mental Status: She is alert and oriented to person, place, and time.  Psychiatric:        Mood and Affect: Mood normal.        Thought Content: Thought content normal.      UC Treatments / Results  Labs (all labs ordered are listed, but only abnormal results  are displayed) Labs Reviewed  SARS CORONAVIRUS 2 (TAT 6-24 HRS)    EKG   Radiology No results found.  Procedures Procedures (including critical care time)  Medications Ordered in UC Medications  ipratropium-albuterol (DUONEB) 0.5-2.5 (3) MG/3ML nebulizer solution 3 mL (3 mLs Nebulization Given 09/14/22 1431)    Initial Impression / Assessment and Plan / UC Course  I have reviewed the triage vital signs and the nursing notes.  Pertinent labs & imaging results that were available during my care of the patient were reviewed by me and considered in my medical decision making (see chart for details).     Mildly hypertensive in triage, otherwise vital signs reassuring.  She is overall well-appearing and in no acute distress.  On exam, she does have some moderate wheezes bilaterally.  This was mildly improved with a DuoNeb treatment in clinic.  Will treat for viral bronchitis with prednisone, albuterol inhaler as needed, Phenergan DM, supportive over-the-counter medications while awaiting COVID results.  Good candidate for molnupiravir if positive.  Return for any worsening symptoms at any time.  Final Clinical Impressions(s) / UC Diagnoses   Final diagnoses:  Viral bronchitis   Discharge Instructions   None    ED Prescriptions     Medication Sig Dispense Auth. Provider   predniSONE  (DELTASONE) 20 MG tablet Take 2 tablets (40 mg total) by mouth daily with breakfast. 10 tablet Particia Nearing, PA-C   promethazine-dextromethorphan (PROMETHAZINE-DM) 6.25-15 MG/5ML syrup Take 5 mLs by mouth 4 (four) times daily as needed. 100 mL Particia Nearing, PA-C   albuterol (VENTOLIN HFA) 108 (90 Base) MCG/ACT inhaler Inhale 2 puffs into the lungs every 4 (four) hours as needed for wheezing or shortness of breath. 18 g Particia Nearing, [place]      PDMP not reviewed this encounter.   Particia Nearing, [place] 09/14/22 1722

## 2022-09-14 NOTE — ED Triage Notes (Signed)
Pt reports wheezing, sinus drainage, headache, and chest congestion x 1 day

## 2022-09-15 LAB — SARS CORONAVIRUS 2 (TAT 6-24 HRS): SARS Coronavirus 2: NEGATIVE

## 2022-09-20 ENCOUNTER — Other Ambulatory Visit: Payer: Self-pay | Admitting: Family Medicine

## 2022-09-25 ENCOUNTER — Other Ambulatory Visit: Payer: Self-pay | Admitting: Cardiology

## 2022-09-25 DIAGNOSIS — I10 Essential (primary) hypertension: Secondary | ICD-10-CM

## 2022-10-24 ENCOUNTER — Encounter: Payer: Self-pay | Admitting: Family Medicine

## 2022-10-24 ENCOUNTER — Other Ambulatory Visit: Payer: Self-pay

## 2022-10-24 ENCOUNTER — Telehealth: Payer: Self-pay | Admitting: Family Medicine

## 2022-10-24 ENCOUNTER — Other Ambulatory Visit: Payer: Self-pay | Admitting: Family Medicine

## 2022-10-24 MED ORDER — UNABLE TO FIND: 2 refills | Status: AC

## 2022-10-24 NOTE — Telephone Encounter (Signed)
Pt called in want to see if she can get Jardience Samples  Wants a call back in regard

## 2022-10-31 ENCOUNTER — Other Ambulatory Visit: Payer: Self-pay | Admitting: Family Medicine

## 2022-11-07 DIAGNOSIS — C50011 Malignant neoplasm of nipple and areola, right female breast: Secondary | ICD-10-CM | POA: Diagnosis not present

## 2022-11-07 DIAGNOSIS — C50012 Malignant neoplasm of nipple and areola, left female breast: Secondary | ICD-10-CM | POA: Diagnosis not present

## 2022-11-15 ENCOUNTER — Other Ambulatory Visit: Payer: Self-pay | Admitting: Cardiology

## 2022-11-15 DIAGNOSIS — I10 Essential (primary) hypertension: Secondary | ICD-10-CM

## 2022-12-05 ENCOUNTER — Other Ambulatory Visit: Payer: Self-pay | Admitting: Family Medicine

## 2022-12-10 ENCOUNTER — Ambulatory Visit
Admission: EM | Admit: 2022-12-10 | Discharge: 2022-12-10 | Disposition: A | Discharge status: Home / Self Care | Payer: PPO | Attending: Nurse Practitioner | Admitting: Nurse Practitioner

## 2022-12-10 DIAGNOSIS — Z20822 Contact with and (suspected) exposure to covid-19: Secondary | ICD-10-CM | POA: Diagnosis not present

## 2022-12-10 DIAGNOSIS — J069 Acute upper respiratory infection, unspecified: Secondary | ICD-10-CM | POA: Diagnosis not present

## 2022-12-10 DIAGNOSIS — J3489 Other specified disorders of nose and nasal sinuses: Secondary | ICD-10-CM | POA: Diagnosis not present

## 2022-12-10 MED ORDER — FLUTICASONE PROPIONATE 50 MCG/ACT NA SUSP: 2.0000 | Freq: Every day | NASAL | 0 refills | Status: AC

## 2022-12-10 MED ORDER — LEVOCETIRIZINE DIHYDROCHLORIDE 5 MG PO TABS: 5.0000 mg | ORAL_TABLET | Freq: Every evening | ORAL | 0 refills | Status: AC

## 2022-12-10 MED ORDER — LEVOCETIRIZINE DIHYDROCHLORIDE 5 MG PO TABS: 5.0000 mg | ORAL_TABLET | Freq: Every evening | ORAL | 0 refills | Status: DC

## 2022-12-10 MED ORDER — FLUTICASONE PROPIONATE 50 MCG/ACT NA SUSP: 2.0000 | Freq: Every day | NASAL | 0 refills | Status: DC

## 2022-12-10 NOTE — Discharge Instructions (Signed)
Take COVID test at home as discussed.  If your COVID test is positive, please contact our office for antiviral treatment. Take medication as directed. Increase fluids and get plenty of rest. May take over-the-counter ibuprofen or Tylenol as needed for pain, fever, or general discomfort. Recommend normal saline nasal spray to help with nasal congestion throughout the day. Warm salt water gargles 3-4 times daily as needed. If you develop a fever, you should remain home until you have been fever free for 24 hours with no medication. If your symptoms worsen, or fail to improve with this treatment, you may follow-up in this clinic or with your primary care physician for further evaluation. Follow-up as needed.

## 2022-12-10 NOTE — ED Triage Notes (Signed)
Pt reports she has a sore throat, "stabbing "ear pain, headache, and sinus drainage since 12 am.     3 covid exposures, pt states.

## 2022-12-10 NOTE — ED Provider Notes (Signed)
[facility]    CSN: [id number] Arrival date & time: 12/10/22  1016      History   Chief Complaint Chief Complaint  Patient presents with   sinus problems    HPI Amy Houston is a 77 y.o. female.   The history is provided by the patient.   Patient presents for complaints of sore throat, ear pain, headache, and sinus drainage that started over the past 24 hours.  She denies fever, chills, ear pain, ear drainage, cough, abdominal pain, nausea, vomiting, diarrhea, or rash.  Patient reports that she took Tylenol for her symptoms.  Reports that she has had at least 3 COVID exposures over the Thanksgiving holiday.  Patient reports that she would like to take a COVID test at home.  Past Medical History:  Diagnosis Date   Abnormal genetic test 04/09/2013   PALB2 and CHEK2 positivity    Arthritis    "of the spine" (05/08/2012)   Breast cancer (HCC) 1995   "left" (05/08/2012)   Breast cancer, right breast, IDC, Multifocal 03/12/2012   Multifocal, 10 and 8 left breast    Depression, major, single episode, severe (HCC) 04/21/2021   Diabetes mellitus 1999   "dx; never on meds" (05/08/2012)   Diabetes mellitus without complication (HCC) 01/08/2019   Essential hypertension, benign 01/08/2019   Exertional shortness of breath    Family history of thyroid cancer    GERD (gastroesophageal reflux disease)    tums   H/O hiatal hernia    Hemorrhoid    History of blood transfusion    post child birth   Hyperlipidemia    IBS (irritable bowel syndrome)    Osteopenia 12/11/2012   On Ca++ and Vit D. Bone density in April 2014.  Next bone density due in April 2016.   PONV (postoperative nausea and vomiting)    PPD positive, treated 1999    Patient Active Problem List   Diagnosis Date Noted   Annual physical exam 09/11/2022   Lichen sclerosus et atrophicus 06/20/2022   Itching in the vaginal area 06/20/2022   Dysuria 05/28/2022   Vaginitis 05/28/2022   Scalp laceration,  subsequent encounter 01/08/2022   Bruise, trunk, subsequent encounter 01/08/2022   Encounter for removal of staples 01/08/2022   Allergic rhinitis 01/08/2022   Coronary artery ectasia (HCC) 10/03/2021   MCI (mild cognitive impairment) 07/26/2021   FH: dementia 07/26/2021   Anxiety 05/22/2021   Positive PPD, treated 05/20/2021   Acute gout 05/06/2021   Gout 04/21/2021   Blood in the stool 12/16/2020   Long-term current use of bisphosphonate 08/12/2020   Dyspnea on exertion    Angina pectoris (HCC) 03/15/2020   Class 1 obesity with serious comorbidity and body mass index (BMI) of 33.0 to 33.9 in adult 09/04/2019   Family history of thyroid cancer 09/04/2019   Hypercholesteremia 09/04/2019   Vitamin D deficiency disease 07/24/2019   Primary hypertension 01/08/2019   Type 2 diabetes mellitus with vascular disease (HCC) 01/08/2019   Diverticulitis of colon 03/08/2015   Acquired trigger finger 05/06/2013   Abnormal genetic test 04/09/2013   Osteopenia 12/11/2012   Thyroid nodule 06/21/2012   Gastroesophageal reflux disease without esophagitis 06/03/2012   Adenomatous polyps 06/03/2012   Breast cancer, right breast, IDC, Multifocal 03/12/2012   Popliteal cyst 08/18/2010   Hx Left breast cancer, UOQ, IDC, receptor negative 11/08/1993    Past Surgical History:  Procedure Laterality Date   ABDOMINAL HYSTERECTOMY Bilateral 01/30/2013   Procedure: HYSTERECTOMY ABDOMINAL;  Surgeon:  Bing Plume, MD;  Location: [facility];  Service: Gynecology;  Laterality: Bilateral;   BREAST BIOPSY Left 1995   BREAST BIOPSY Bilateral 03/2012   "one on the left; 2 on the right" (05/08/2012)   BREAST LUMPECTOMY Left 1995   CHOLECYSTECTOMY  1992   COLONOSCOPY  12/06/93   Dr. Hayes:single diminutive polyp of the descending colon/extrernal hemorrhoids, benign path   COLONOSCOPY  11/30/98   Dr Hayes:small rectal polyp/internal hemorrhoids, benign path   COLONOSCOPY  11/26/2006   Dr. Hayes:sigmoid polyp/small  interal hemorrhoids, adenomatous   COLONOSCOPY N/A 03/30/2016   Dr. Darrick Penna: normal TI, 4 mm rectal polyp, multiple small and large-mouthed diverticula in rectosigmoid and sigmoid colon, non-bleeding internal hemorrhoids, tubular adenoma on path. surveillance in 5 yeras   COLONOSCOPY WITH ESOPHAGOGASTRODUODENOSCOPY (EGD) N/A 07/01/2012   Dr. Darrick Penna: 2 small adenomas, sigmoid colon diverticula, surveillance in 2019   DILATION AND CURETTAGE OF UTERUS  1968   ESOPHAGEAL DILATION  07/01/2012   Dr. Darrick Penna: proximal esophageal web, Schatzki's ring at [employer] junction, s/p dilaiton. Mild non-erosive gastritis. Repeat EGD July 23, 2012.    ESOPHAGOGASTRODUODENOSCOPY  1994   Dr. Matthias Hughs: small hiatal hernia and Schatzki's ring widely patent, CLO test negative   ESOPHAGOGASTRODUODENOSCOPY (EGD) WITH ESOPHAGEAL DILATION N/A 07/23/2012   Dr. Fields:proximal esophageal web v. radiation induced stricture/schatzki ring/small HH, s/p dilation   LEFT HEART CATH AND CORONARY ANGIOGRAPHY N/A 03/16/2020   Procedure: LEFT HEART CATH AND CORONARY ANGIOGRAPHY;  Surgeon: Yates Decamp, MD;  Location: [facility];  Service: Cardiovascular;  Laterality: N/A;   MASTECTOMY COMPLETE / SIMPLE Left 05/08/2012   MASTECTOMY COMPLETE / SIMPLE W/ SENTINEL NODE BIOPSY Right 05/08/2012   SALPINGOOPHORECTOMY Bilateral 01/30/2013   Procedure: SALPINGO OOPHORECTOMY;  Surgeon: Bing Plume, MD;  Location: [facility];  Service: Gynecology;  Laterality: Bilateral;   SIMPLE MASTECTOMY WITH AXILLARY SENTINEL NODE BIOPSY Right 05/08/2012   Procedure: RIGHT TOTAL MASTECTOMY WITH AXILLARY SENTINEL NODE BIOPSY;  Surgeon: Currie Paris, MD;  Location: [facility];  Service: General;  Laterality: Right;   SIMPLE MASTECTOMY WITH AXILLARY SENTINEL NODE BIOPSY Left 05/08/2012   Procedure: LEFT TOTAL MASTECTOMY;  Surgeon: Currie Paris, MD;  Location: [facility];  Service: General;  Laterality: Left;   TONSILLECTOMY AND ADENOIDECTOMY  ~ 2008   TUBAL LIGATION  1976    UVULOPALATOPHARYNGOPLASTY, TONSILLECTOMY AND SEPTOPLASTY  ~ 2008    OB History     Gravida  3   Para  2   Term  2   Preterm      AB  1   Living  2      SAB  1   IAB      Ectopic      Multiple      Live Births  2            Home Medications    Prior to Admission medications   Medication Sig Start Date End Date Taking? Authorizing Provider  fluticasone (FLONASE) 50 MCG/ACT nasal spray Place 2 sprays into both nostrils daily. 12/10/22  Yes Leath-Warren, Sadie Haber, NP  levocetirizine (XYZAL) 5 MG tablet Take 1 tablet (5 mg total) by mouth at bedtime. 12/10/22  Yes Leath-Warren, Sadie Haber, NP  UNABLE TO FIND Med Name: Permaform bra  Dx: 174.9 Bilateral mastectomy 10/24/22   Kerri Perches, MD  acetaminophen (TYLENOL) 500 MG tablet Take 1,000 mg by mouth daily as needed for moderate pain or headache.     [provider]  albuterol (VENTOLIN HFA) 108 (90 Base) MCG/ACT inhaler Inhale 2 puffs into the lungs every 4 (four) hours as needed for wheezing or shortness of breath. 09/14/22   Particia Nearing, PA-C  aspirin EC 81 MG tablet Take 81 mg by mouth daily. Swallow whole.    [provider]  blood glucose meter kit and supplies Dispense based on patient and insurance preference. Once daily testing DX E11.9 05/06/21   Kerri Perches, MD  Chlorphen-PE-Acetaminophen (NOREL AD) 4-10-325 MG TABS Take 1 tablet by mouth 3 (three) times daily as needed (Nasal congestion). 10/20/21   Anabel Halon, MD  Cholecalciferol (VITAMIN D) 2000 units CAPS Take 6,000 Units by mouth at bedtime.     [provider]  clobetasol ointment (TEMOVATE) 0.05 % Apply 2 x daily to vulva area for 4 weeks then 2-3 x weekly 06/20/22   Cyril Mourning A, NP  glucose blood test strip Use as instructed daily dx e11.9 05/06/21   Kerri Perches, MD  JARDIANCE 25 MG TABS tablet Take 1 tablet by mouth daily before breakfast. 10/24/22   Kerri Perches, MD   Lancets 30G MISC Once daily testing dx e11.9 05/06/21   Kerri Perches, MD  lisinopril (ZESTRIL) 40 MG tablet Take 1 tablet (40 mg total) by mouth daily. 04/21/21   Paseda, Baird Kay, FNP  metoprolol succinate (TOPROL-XL) 100 MG 24 hr tablet TAKE 1 TABLET BY MOUTH ONCE A DAY. 11/15/22   Yates Decamp, MD  Misc Natural Products (TART CHERRY ADVANCED PO) Take by mouth. 1 daily    [provider]  Multiple Vitamin (MULTIVITAMIN WITH MINERALS) TABS Take 1 tablet by mouth at bedtime.     [provider]  nitroGLYCERIN (NITROSTAT) 0.4 MG SL tablet Place 0.4 mg under the tongue every 5 (five) minutes as needed for chest pain.    [provider]  omeprazole (PRILOSEC) 20 MG capsule TAKE ONE CAPSULE BY MOUTH ONCE DAILY. 06/01/21   Gelene Mink, NP  ondansetron (ZOFRAN ODT) 4 MG disintegrating tablet Take 1 tablet (4 mg total) by mouth every 8 (eight) hours as needed for nausea or vomiting. 02/14/21   Donell Beers, FNP  predniSONE (DELTASONE) 20 MG tablet Take 2 tablets (40 mg total) by mouth daily with breakfast. 09/14/22   Particia Nearing, PA-C  promethazine-dextromethorphan (PROMETHAZINE-DM) 6.25-15 MG/5ML syrup Take 5 mLs by mouth 4 (four) times daily as needed. 09/14/22   Particia Nearing, PA-C  rosuvastatin (CRESTOR) 10 MG tablet Take 1 tablet (10 mg total) by mouth daily. 10/24/22   Kerri Perches, MD  venlafaxine XR (EFFEXOR-XR) 75 MG 24 hr capsule TAKE ONE CAPSULE BY MOUTH TWO TIMES DAILY 12/06/22   Kerri Perches, MD    Family History Family History  Problem Relation Age of Onset   Lung cancer Mother 43       smoker   Depression Father    Leukemia Father 86       Hairy Cell at 86, CLL at 58   Lymphoma Father 31       Hodgkins lymphoma   Lung cancer Sister 27   Thyroid cancer Brother 51       Medullary   Lymphoma Brother 60       Follicular lymphoma   Cancer Daughter        thyroid   Other Daughter        leiomyoma of esophagus    Thyroid cancer Daughter    Cancer  Daughter        breast   Breast cancer Daughter    Breast cancer Maternal Aunt        mother's paternal half sister   Colon cancer Maternal Aunt        mother's maternal half sister   Lung cancer Maternal Uncle        mother's paternal half brother   Cancer Paternal Uncle        oral cancer dx in his 28s   Lung cancer Maternal Grandmother        smoker   Lung cancer Cousin        paternal cousin   Breast cancer Cousin        paternal cousin died in her 67s   Heart attack Nephew     Social History Social History   Tobacco Use   Smoking status: Never   Smokeless tobacco: Never  Vaping Use   Vaping status: Never Used  Substance Use Topics   Alcohol use: Not Currently    Comment: rare    Drug use: No     Allergies   Moxifloxacin hcl, Penicillins, Colchicine, and Indomethacin   Review of Systems Review of Systems Per HPI  Physical Exam Triage Vital Signs ED Triage Vitals  Encounter Vitals Group     BP 12/10/22 1048 (!) 129/59     Systolic BP Percentile --      Diastolic BP Percentile --      Pulse Rate 12/10/22 1048 (!) 59     Resp 12/10/22 1048 20     Temp 12/10/22 1048 98.6 F (37 C)     Temp Source 12/10/22 1048 Oral     SpO2 12/10/22 1048 92 %     Weight --      Height --      Head Circumference --      Peak Flow --      Pain Score 12/10/22 1051 0     Pain Loc --      Pain Education --      Exclude from Growth Chart --    No data found.  Updated Vital Signs BP (!) 129/59 (BP Location: Right Arm)   Pulse (!) 59   Temp 98.6 F (37 C) (Oral)   Resp 20   SpO2 92%   Visual Acuity Right Eye Distance:   Left Eye Distance:   Bilateral Distance:    Right Eye Near:   Left Eye Near:    Bilateral Near:     Physical Exam Vitals and nursing note reviewed.  Constitutional:      General: She is not in acute distress.    Appearance: Normal appearance.  HENT:     Head: Normocephalic.     Right Ear: Tympanic  membrane, ear canal and external ear normal.     Left Ear: Tympanic membrane, ear canal and external ear normal.     Nose: Congestion present.     Right Turbinates: Enlarged and swollen.     Left Turbinates: Enlarged and swollen.     Right Sinus: No maxillary sinus tenderness or frontal sinus tenderness.     Left Sinus: No maxillary sinus tenderness or frontal sinus tenderness.     Mouth/Throat:     Lips: Pink.     Mouth: Mucous membranes are moist.     Pharynx: Uvula midline. Posterior oropharyngeal erythema and postnasal drip present. No pharyngeal swelling, oropharyngeal exudate or uvula swelling.     Comments: Cobblestoning  present to posterior oropharynx  Eyes:     Extraocular Movements: Extraocular movements intact.     Conjunctiva/sclera: Conjunctivae normal.     Pupils: Pupils are equal, round, and reactive to light.  Cardiovascular:     Rate and Rhythm: Normal rate and regular rhythm.     Pulses: Normal pulses.     Heart sounds: Normal heart sounds.  Pulmonary:     Effort: Pulmonary effort is normal. No respiratory distress.     Breath sounds: Normal breath sounds. No stridor. No wheezing, rhonchi or rales.  Abdominal:     General: Bowel sounds are normal.     Palpations: Abdomen is soft.     Tenderness: There is no abdominal tenderness.  Musculoskeletal:     Cervical back: Normal range of motion.  Lymphadenopathy:     Cervical: No cervical adenopathy.  Skin:    General: Skin is warm and dry.  Neurological:     General: No focal deficit present.     Mental Status: She is alert and oriented to person, place, and time.  Psychiatric:        Mood and Affect: Mood normal.        Behavior: Behavior normal.      UC Treatments / Results  Labs (all labs ordered are listed, but only abnormal results are displayed) Labs Reviewed - No data to display  EKG   Radiology No results found.  Procedures Procedures (including critical care time)  Medications Ordered in  UC Medications - No data to display  Initial Impression / Assessment and Plan / UC Course  I have reviewed the triage vital signs and the nursing notes.  Pertinent labs & imaging results that were available during my care of the patient were reviewed by me and considered in my medical decision making (see chart for details).  Patient declines COVID test, states that she would like to take a test at home.  Advised patient that if her COVID test at home is positive, to contact the office and we will provide antiviral therapy if she chooses.  Suspect a viral upper respiratory infection.  Will provide symptomatic treatment with fluticasone 50 mcg nasal spray for nasal congestion, and Xyzal 5 mg as an antihistamine.  Supportive care recommendations were provided and discussed with the patient to include fluids, rest, over-the-counter analgesics, normal saline nasal spray, and warm salt water gargles.  Discussed with patient indications of when follow-up will be necessary.  Patient was in agreement with this plan of care and verbalizes understanding.  All questions were answered.  Patient stable for discharge.  Final Clinical Impressions(s) / UC Diagnoses   Final diagnoses:  Viral upper respiratory infection     Discharge Instructions      Take COVID test at home as discussed.  If your COVID test is positive, please contact our office for antiviral treatment. Take medication as directed. Increase fluids and get plenty of rest. May take over-the-counter ibuprofen or Tylenol as needed for pain, fever, or general discomfort. Recommend normal saline nasal spray to help with nasal congestion throughout the day. Warm salt water gargles 3-4 times daily as needed. If you develop a fever, you should remain home until you have been fever free for 24 hours with no medication. If your symptoms worsen, or fail to improve with this treatment, you may follow-up in this clinic or with your primary care  physician for further evaluation. Follow-up as needed.     ED Prescriptions  Medication Sig Dispense Auth. Provider   fluticasone (FLONASE) 50 MCG/ACT nasal spray  (Status: Discontinued) Place 2 sprays into both nostrils daily. 16 g Leath-Warren, Sadie Haber, NP   levocetirizine (XYZAL ALLERGY 24HR) 5 MG tablet  (Status: Discontinued) Take 1 tablet (5 mg total) by mouth at bedtime. 30 tablet Leath-Warren, Sadie Haber, NP   fluticasone (FLONASE) 50 MCG/ACT nasal spray Place 2 sprays into both nostrils daily. 16 g Leath-Warren, Sadie Haber, NP   levocetirizine (XYZAL) 5 MG tablet Take 1 tablet (5 mg total) by mouth at bedtime. 30 tablet Leath-Warren, Sadie Haber, NP      PDMP not reviewed this encounter.   Abran Cantor, NP 12/10/22 1115

## 2022-12-11 LAB — SARS CORONAVIRUS 2 (TAT 6-24 HRS): SARS Coronavirus 2: NEGATIVE

## 2022-12-15 ENCOUNTER — Encounter: Payer: Self-pay | Admitting: Genetic Counselor

## 2022-12-15 DIAGNOSIS — Z1589 Genetic susceptibility to other disease: Secondary | ICD-10-CM | POA: Insufficient documentation

## 2023-01-01 ENCOUNTER — Other Ambulatory Visit: Payer: Self-pay | Admitting: Cardiology

## 2023-01-01 DIAGNOSIS — I10 Essential (primary) hypertension: Secondary | ICD-10-CM

## 2023-01-12 ENCOUNTER — Encounter: Payer: Self-pay | Admitting: Family Medicine

## 2023-01-12 ENCOUNTER — Other Ambulatory Visit: Payer: Self-pay | Admitting: Family Medicine

## 2023-01-12 DIAGNOSIS — I10 Essential (primary) hypertension: Secondary | ICD-10-CM

## 2023-01-15 ENCOUNTER — Other Ambulatory Visit: Payer: Self-pay

## 2023-01-15 DIAGNOSIS — I10 Essential (primary) hypertension: Secondary | ICD-10-CM

## 2023-01-15 MED ORDER — METOPROLOL SUCCINATE ER 100 MG PO TB24: 100.0000 mg | ORAL_TABLET | Freq: Every day | ORAL | 0 refills | Status: AC

## 2023-01-15 MED ORDER — METOPROLOL SUCCINATE ER 100 MG PO TB24: 100.0000 mg | ORAL_TABLET | Freq: Every day | ORAL | 1 refills | Status: DC

## 2023-01-17 ENCOUNTER — Other Ambulatory Visit (HOSPITAL_COMMUNITY)
Admission: RE | Admit: 2023-01-17 | Discharge: 2023-01-17 | Disposition: A | Discharge status: Home / Self Care | Payer: Self-pay | Source: Ambulatory Visit | Attending: Medical Genetics | Admitting: Medical Genetics

## 2023-01-17 DIAGNOSIS — Z006 Encounter for examination for normal comparison and control in clinical research program: Secondary | ICD-10-CM | POA: Insufficient documentation

## 2023-01-23 DIAGNOSIS — E1159 Type 2 diabetes mellitus with other circulatory complications: Secondary | ICD-10-CM | POA: Diagnosis not present

## 2023-01-24 LAB — BMP8+EGFR
BUN/Creatinine Ratio: 16 (ref 12–28)
BUN: 15 mg/dL (ref 8–27)
CO2: 19 mmol/L — ABNORMAL LOW (ref 20–29)
Calcium: 9.4 mg/dL (ref 8.7–10.3)
Chloride: 108 mmol/L — ABNORMAL HIGH (ref 96–106)
Creatinine, Ser: 0.92 mg/dL (ref 0.57–1.00)
Glucose: 143 mg/dL — ABNORMAL HIGH (ref 70–99)
Potassium: 4.1 mmol/L (ref 3.5–5.2)
Sodium: 143 mmol/L (ref 134–144)
eGFR: 65 mL/min/{1.73_m2} (ref >59)

## 2023-01-24 LAB — HEMOGLOBIN A1C
Est. average glucose Bld gHb Est-mCnc: 143 mg/dL
Hgb A1c MFr Bld: 6.6 % — ABNORMAL HIGH (ref 4.8–5.6)

## 2023-01-26 LAB — GENECONNECT MOLECULAR SCREEN: Genetic Analysis Overall Interpretation: NEGATIVE

## 2023-01-30 ENCOUNTER — Ambulatory Visit (INDEPENDENT_AMBULATORY_CARE_PROVIDER_SITE_OTHER): Payer: PPO | Admitting: Family Medicine

## 2023-01-30 ENCOUNTER — Encounter: Payer: Self-pay | Admitting: Family Medicine

## 2023-01-30 VITALS — BP 131/76 | HR 60 | Ht <= 58 in | Wt 156.1 lb

## 2023-01-30 DIAGNOSIS — E559 Vitamin D deficiency, unspecified: Secondary | ICD-10-CM | POA: Diagnosis not present

## 2023-01-30 DIAGNOSIS — E785 Hyperlipidemia, unspecified: Secondary | ICD-10-CM | POA: Insufficient documentation

## 2023-01-30 DIAGNOSIS — J309 Allergic rhinitis, unspecified: Secondary | ICD-10-CM

## 2023-01-30 DIAGNOSIS — I209 Angina pectoris, unspecified: Secondary | ICD-10-CM

## 2023-01-30 DIAGNOSIS — E66811 Obesity, class 1: Secondary | ICD-10-CM | POA: Diagnosis not present

## 2023-01-30 DIAGNOSIS — Z6833 Body mass index (BMI) 33.0-33.9, adult: Secondary | ICD-10-CM

## 2023-01-30 DIAGNOSIS — F419 Anxiety disorder, unspecified: Secondary | ICD-10-CM | POA: Diagnosis not present

## 2023-01-30 DIAGNOSIS — I10 Essential (primary) hypertension: Secondary | ICD-10-CM

## 2023-01-30 DIAGNOSIS — E1169 Type 2 diabetes mellitus with other specified complication: Secondary | ICD-10-CM

## 2023-01-30 DIAGNOSIS — E6609 Other obesity due to excess calories: Secondary | ICD-10-CM | POA: Diagnosis not present

## 2023-01-30 DIAGNOSIS — F515 Nightmare disorder: Secondary | ICD-10-CM | POA: Diagnosis not present

## 2023-01-30 MED ORDER — METOPROLOL SUCCINATE ER 100 MG PO TB24: 100.0000 mg | ORAL_TABLET | Freq: Every day | ORAL | 2 refills | Status: DC

## 2023-01-30 NOTE — Assessment & Plan Note (Signed)
Controlled, no change in medication DASH diet and commitment to daily physical activity for a minimum of 30 minutes discussed and encouraged, as a part of hypertension management. The importance of attaining a healthy weight is also discussed.     01/30/2023    4:01 PM 12/10/2022   10:48 AM 09/14/2022    2:04 PM 09/08/2022    4:00 PM 09/05/2022   10:30 AM 08/21/2022   10:53 AM 06/20/2022   11:13 AM  BP/Weight  Systolic BP 131 129 160 120 149 132 138  Diastolic BP 76 59 72 70 58 56 60  Wt. (Lbs) 156.12   159 159 158 158  BMI 32.63 kg/m2   33.23 kg/m2 33.23 kg/m2 33.02 kg/m2 33.02 kg/m2

## 2023-01-30 NOTE — Assessment & Plan Note (Signed)
Controlled, no change in medication

## 2023-01-30 NOTE — Assessment & Plan Note (Signed)
No recall, main issue is how spouse handles them he reports her screaming and shakes her t stp thwenihmare, cuddling and calming recommended

## 2023-01-30 NOTE — Assessment & Plan Note (Signed)
Denies any recent episode in past 1 year, states cardiology advised her to return too clinic if/when needed , I will refill metoprolol x 6months

## 2023-01-30 NOTE — Assessment & Plan Note (Signed)
  Patient re-educated about  the importance of commitment to a  minimum of 150 minutes of exercise per week as able.  The importance of healthy food choices with portion control discussed, as well as eating regularly and within a 12 hour window most days. The need to choose "clean , green" food 50 to 75% of the time is discussed, as well as to make water the primary drink and set a goal of 64 ounces water daily.       01/30/2023    4:01 PM 09/08/2022    4:00 PM 09/05/2022   10:30 AM  Weight /BMI  Weight 156 lb 1.9 oz 159 lb 159 lb  Height 4\' 10"  (1.473 m) 4\' 10"  (1.473 m)   BMI 32.63 kg/m2 33.23 kg/m2 33.23 kg/m2

## 2023-01-30 NOTE — Assessment & Plan Note (Signed)
Hyperlipidemia:Low fat diet discussed and encouraged.   Lipid Panel  Lab Results  Component Value Date   CHOL 113 09/06/2022   HDL 43 09/06/2022   LDLCALC 49 09/06/2022   LDLDIRECT 71 02/26/2020   TRIG 119 09/06/2022   CHOLHDL 2.6 09/06/2022     Updated lab needed at/ before next visit.

## 2023-01-30 NOTE — Assessment & Plan Note (Signed)
Diabetes associated with hypertension, hyperlipidemia, obesity, arthritis, and depression  Amy Houston is reminded of the importance of commitment to daily physical activity for 30 minutes or more, as able and the need to limit carbohydrate intake to 30 to 60 grams per meal to help with blood sugar control.   The need to take medication as prescribed, test blood sugar as directed, and to call between visits if there is a concern that blood sugar is uncontrolled is also discussed.   Amy Houston is reminded of the importance of daily foot exam, annual eye examination, and good blood sugar, blood pressure and cholesterol control.     Latest Ref Rng & Units 01/23/2023   10:28 AM 09/06/2022    8:42 AM 08/17/2022   11:02 AM 05/24/2022    5:05 PM 03/14/2022    9:55 AM  Diabetic Labs  HbA1c 4.8 - 5.6 % 6.6  6.5    6.8   Micro/Creat Ratio 0 - 29 mg/g creat    10    Chol 100 - 199 mg/dL  960      HDL >45 mg/dL  43      Calc LDL 0 - 99 mg/dL  49      Triglycerides 0 - 149 mg/dL  409      Creatinine 8.11 - 1.00 mg/dL 9.14  7.82  9.56   2.13       01/30/2023    4:01 PM 12/10/2022   10:48 AM 09/14/2022    2:04 PM 09/08/2022    4:00 PM 09/05/2022   10:30 AM 08/21/2022   10:53 AM 06/20/2022   11:13 AM  BP/Weight  Systolic BP 131 129 160 120 149 132 138  Diastolic BP 76 59 72 70 58 56 60  Wt. (Lbs) 156.12   159 159 158 158  BMI 32.63 kg/m2   33.23 kg/m2 33.23 kg/m2 33.02 kg/m2 33.02 kg/m2      Latest Ref Rng & Units 06/01/2022   12:00 AM 04/21/2021   10:00 AM  Foot/eye exam completion dates  Eye Exam No Retinopathy No Retinopathy       Foot Form Completion   Done     This result is from an external source.      Controlled, no change in medication

## 2023-01-30 NOTE — Assessment & Plan Note (Signed)
Updated lab needed at/ before next visit.

## 2023-01-30 NOTE — Patient Instructions (Addendum)
F/U in early June, call if you need me sooner  Fasting lipid, cmp and eGFr, hBA1C, cBC, Vit D and urine aCR 3 to 5 days before visit, after 05/25/2023   It is important that you exercise regularly at least 30 minutes 5 times a week. If you develop chest pain, have severe difficulty breathing, or feel very tired, stop exercising immediately and seek medical attention  Gentle calming behavior will help nightmares, pls pass this on  Pls cut back on white starchy foods and sweet foods  Thanks for choosing [facility], we consider it a privelige to serve you.

## 2023-01-30 NOTE — Progress Notes (Signed)
Amy Houston     MRN: [id number]      DOB: 1945-06-18  Chief Complaint  Patient presents with   Follow-up    Follow up nightmares    HPI Amy Houston is here for follow up and re-evaluation of chronic medical conditions, medication management and review of any available recent lab and radiology data.  Preventive health is updated, specifically  Cancer screening and Immunization.   Questions or concerns regarding consultations or procedures which the PT has had in the interim are  addressed. The PT denies any adverse reactions to current medications since the last visit.  Wants  to lose an additional 20 pounds, no regular exercise and too many carbs will work on both had questions about higher dose jardiance but currently at max Nightmares from once per week, one everty 2 weeks, does not remember the dream, the most troubling thing is that her spouse shakes her to stop the nightmare saying she is screaming , she has no recall of either but finds his shaking her disturbing, no new personal stress or anxiety   ROS Denies recent fever or chills. Denies sinus pressure, nasal congestion, ear pain or sore throat. Denies chest congestion, productive cough or wheezing. Denies chest pains, palpitations and leg swelling Denies abdominal pain, nausea, vomiting,diarrhea or constipation.   Denies dysuria, frequency, hesitancy or incontinence. Denies joint pain, swelling and limitation in mobility. Denies headaches, seizures, numbness, or tingling.  Denies skin break down or rash.   PE  BP 131/76 (BP Location: Right Arm, Patient Position: Sitting, Cuff Size: Large)   Pulse 60   Ht 4\' 10"  (1.473 m)   Wt 156 lb 1.9 oz (70.8 kg)   SpO2 94%   BMI 32.63 kg/m   Patient alert and oriented and in no cardiopulmonary distress.  HEENT: No facial asymmetry, EOMI,     Neck supple .  Chest: Clear to auscultation bilaterally.  CVS: S1, S2 no murmurs, no S3.Regular rate.  ABD: Soft non tender.    Ext: No edema  MS: Adequate ROM spine, shoulders, hips and knees.  Skin: Intact, no ulcerations or rash noted.  Psych: Good eye contact, normal affect. Memory intact not anxious or depressed appearing.  CNS: CN 2-12 intact, power,  normal throughout.no focal deficits noted.   Assessment & Plan  Angina pectoris (HCC) Denies any recent episode in past 1 year, states cardiology advised her to return too clinic if/when needed , I will refill metoprolol x 6months  Anxiety Controlled, no change in medication   Hypercholesteremia Hyperlipidemia:Low fat diet discussed and encouraged.   Lipid Panel  Lab Results  Component Value Date   CHOL 113 09/06/2022   HDL 43 09/06/2022   LDLCALC 49 09/06/2022   LDLDIRECT 71 02/26/2020   TRIG 119 09/06/2022   CHOLHDL 2.6 09/06/2022     Updated lab needed at/ before next visit.   Type 2 diabetes mellitus with other specified complication (HCC) Diabetes associated with hypertension, hyperlipidemia, obesity, arthritis, and depression  Amy Houston is reminded of the importance of commitment to daily physical activity for 30 minutes or more, as able and the need to limit carbohydrate intake to 30 to 60 grams per meal to help with blood sugar control.   The need to take medication as prescribed, test blood sugar as directed, and to call between visits if there is a concern that blood sugar is uncontrolled is also discussed.   Ms. Patient is reminded of the importance of daily foot  exam, annual eye examination, and good blood sugar, blood pressure and cholesterol control.     Latest Ref Rng & Units 01/23/2023   10:28 AM 09/06/2022    8:42 AM 08/17/2022   11:02 AM 05/24/2022    5:05 PM 03/14/2022    9:55 AM  Diabetic Labs  HbA1c 4.8 - 5.6 % 6.6  6.5    6.8   Micro/Creat Ratio 0 - 29 mg/g creat    10    Chol 100 - 199 mg/dL  161      HDL >09 mg/dL  43      Calc LDL 0 - 99 mg/dL  49      Triglycerides 0 - 149 mg/dL  604      Creatinine 5.40  - 1.00 mg/dL 9.81  1.91  4.78   2.95       01/30/2023    4:01 PM 12/10/2022   10:48 AM 09/14/2022    2:04 PM 09/08/2022    4:00 PM 09/05/2022   10:30 AM 08/21/2022   10:53 AM 06/20/2022   11:13 AM  BP/Weight  Systolic BP 131 129 160 120 149 132 138  Diastolic BP 76 59 72 70 58 56 60  Wt. (Lbs) 156.12   159 159 158 158  BMI 32.63 kg/m2   33.23 kg/m2 33.23 kg/m2 33.02 kg/m2 33.02 kg/m2      Latest Ref Rng & Units 06/01/2022   12:00 AM 04/21/2021   10:00 AM  Foot/eye exam completion dates  Eye Exam No Retinopathy No Retinopathy       Foot Form Completion   Done     This result is from an external source.      Controlled, no change in medication   Primary hypertension Controlled, no change in medication DASH diet and commitment to daily physical activity for a minimum of 30 minutes discussed and encouraged, as a part of hypertension management. The importance of attaining a healthy weight is also discussed.     01/30/2023    4:01 PM 12/10/2022   10:48 AM 09/14/2022    2:04 PM 09/08/2022    4:00 PM 09/05/2022   10:30 AM 08/21/2022   10:53 AM 06/20/2022   11:13 AM  BP/Weight  Systolic BP 131 129 160 120 149 132 138  Diastolic BP 76 59 72 70 58 56 60  Wt. (Lbs) 156.12   159 159 158 158  BMI 32.63 kg/m2   33.23 kg/m2 33.23 kg/m2 33.02 kg/m2 33.02 kg/m2       Vitamin D deficiency disease Updated lab needed at/ before next visit.   Hyperlipidemia LDL goal <100 Hyperlipidemia:Low fat diet discussed and encouraged.   Lipid Panel  Lab Results  Component Value Date   CHOL 113 09/06/2022   HDL 43 09/06/2022   LDLCALC 49 09/06/2022   LDLDIRECT 71 02/26/2020   TRIG 119 09/06/2022   CHOLHDL 2.6 09/06/2022     Updated lab needed at/ before next visit.   Class 1 obesity with serious comorbidity and body mass index (BMI) of 33.0 to 33.9 in adult  Patient re-educated about  the importance of commitment to a  minimum of 150 minutes of exercise per week as able.  The  importance of healthy food choices with portion control discussed, as well as eating regularly and within a 12 hour window most days. The need to choose "clean , green" food 50 to 75% of the time is discussed, as well as to make water the primary drink  and set a goal of 64 ounces water daily.       01/30/2023    4:01 PM 09/08/2022    4:00 PM 09/05/2022   10:30 AM  Weight /BMI  Weight 156 lb 1.9 oz 159 lb 159 lb  Height 4\' 10"  (1.473 m) 4\' 10"  (1.473 m)   BMI 32.63 kg/m2 33.23 kg/m2 33.23 kg/m2      Allergic rhinitis Controlled, no change in medication   Nightmares No recall, main issue is how spouse handles them he reports her screaming and shakes her t stp thwenihmare, cuddling and calming recommended

## 2023-01-31 ENCOUNTER — Other Ambulatory Visit: Payer: Self-pay | Admitting: Family Medicine

## 2023-02-20 ENCOUNTER — Other Ambulatory Visit: Payer: Self-pay | Admitting: Family Medicine

## 2023-02-27 ENCOUNTER — Encounter (HOSPITAL_COMMUNITY): Payer: Self-pay | Admitting: Hematology

## 2023-02-27 ENCOUNTER — Emergency Department (HOSPITAL_COMMUNITY): Payer: Worker's Compensation

## 2023-02-27 ENCOUNTER — Emergency Department (HOSPITAL_COMMUNITY)
Admission: EM | Admit: 2023-02-27 | Discharge: 2023-02-27 | Disposition: A | Discharge status: Home / Self Care | Payer: Worker's Compensation | Attending: Emergency Medicine | Admitting: Emergency Medicine

## 2023-02-27 ENCOUNTER — Other Ambulatory Visit: Payer: Self-pay

## 2023-02-27 ENCOUNTER — Encounter (HOSPITAL_COMMUNITY): Payer: Self-pay | Admitting: Emergency Medicine

## 2023-02-27 DIAGNOSIS — Z7982 Long term (current) use of aspirin: Secondary | ICD-10-CM | POA: Insufficient documentation

## 2023-02-27 DIAGNOSIS — S0990XA Unspecified injury of head, initial encounter: Secondary | ICD-10-CM | POA: Diagnosis present

## 2023-02-27 DIAGNOSIS — Y990 Civilian activity done for income or pay: Secondary | ICD-10-CM | POA: Insufficient documentation

## 2023-02-27 DIAGNOSIS — W19XXXA Unspecified fall, initial encounter: Secondary | ICD-10-CM

## 2023-02-27 DIAGNOSIS — W01198A Fall on same level from slipping, tripping and stumbling with subsequent striking against other object, initial encounter: Secondary | ICD-10-CM | POA: Diagnosis not present

## 2023-02-27 DIAGNOSIS — M533 Sacrococcygeal disorders, not elsewhere classified: Secondary | ICD-10-CM | POA: Diagnosis not present

## 2023-02-27 NOTE — ED Provider Notes (Incomplete)
 [place] EMERGENCY DEPARTMENT AT [facility] Provider Note   CSN: [id number] Arrival date & time: 02/27/23  1146     History {Add pertinent medical, surgical, social history, OB history to HPI:1} Chief Complaint  Patient presents with  . Fall    Amy Houston is a 78 y.o. female.  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 No loss of consciousness. She is on a daily aspirin. Denies neck and back pain.  The history is provided by the patient.  Fall This is a new problem. The current episode started 1 to 2 hours ago. Pertinent negatives include no headaches and no shortness of breath. She has tried nothing for the symptoms.       Home Medications Prior to Admission medications   Medication Sig Start Date End Date Taking? Authorizing Provider  acetaminophen (TYLENOL) 500 MG tablet Take 1,000 mg by mouth daily as needed for moderate pain or headache.     [provider]  albuterol (VENTOLIN HFA) 108 (90 Base) MCG/ACT inhaler Inhale 2 puffs into the lungs every 4 (four) hours as needed for wheezing or shortness of breath. 09/14/22   Particia Nearing, PA-C  aspirin EC 81 MG tablet Take 81 mg by mouth daily. Swallow whole.    [provider]  blood glucose meter kit and supplies Dispense based on patient and insurance preference. Once daily testing DX E11.9 05/06/21   Kerri Perches, MD  Chlorphen-PE-Acetaminophen (NOREL AD) 4-10-325 MG TABS Take 1 tablet by mouth 3 (three) times daily as needed (Nasal congestion). 10/20/21   Anabel Halon, MD  Cholecalciferol (VITAMIN D) 2000 units CAPS Take 6,000 Units by mouth at bedtime.     [provider]  clobetasol ointment (TEMOVATE) 0.05 % Apply 2 x daily to vulva area for 4 weeks then 2-3 x weekly 06/20/22   Adline Potter, NP   fluticasone (FLONASE) 50 MCG/ACT nasal spray Place 2 sprays into both nostrils daily. 12/10/22   Leath-Warren, Sadie Haber, NP  glucose blood test strip Use as instructed daily dx e11.9 05/06/21   Kerri Perches, MD  JARDIANCE 25 MG TABS tablet Take 1 tablet by mouth daily before breakfast. 02/01/23   Kerri Perches, MD  Lancets 30G MISC Once daily testing dx e11.9 05/06/21   Kerri Perches, MD  levocetirizine (XYZAL) 5 MG tablet Take 1 tablet (5 mg total) by mouth at bedtime. 12/10/22   Leath-Warren, Sadie Haber, NP  lisinopril (ZESTRIL) 40 MG tablet Take 1 tablet (40 mg total) by mouth daily. 04/21/21   Donell Beers, FNP  metoprolol succinate (TOPROL-XL) 100 MG 24 hr tablet Take 1 tablet (100 mg total) by mouth daily. Please call [phone] to schedule an appointment for future refills. Thank you. 2nd attempt. 01/15/23   Kerri Perches, MD  metoprolol succinate (TOPROL-XL) 100 MG 24 hr tablet Take 1 tablet (100 mg total) by mouth daily. Take with or immediately following a meal. 01/30/23   Kerri Perches, MD  Misc Natural Products (TART CHERRY ADVANCED PO) Take by mouth. 1 daily    [provider]  Multiple Vitamin (MULTIVITAMIN WITH MINERALS) TABS Take 1 tablet by mouth at bedtime.     [provider]  nitroGLYCERIN (NITROSTAT) 0.4 MG SL tablet Place 0.4 mg under the tongue every 5 (  five) minutes as needed for chest pain.    [provider]  omeprazole (PRILOSEC) 20 MG capsule TAKE ONE CAPSULE BY MOUTH ONCE DAILY. 06/01/21   Gelene Mink, NP  ondansetron (ZOFRAN ODT) 4 MG disintegrating tablet Take 1 tablet (4 mg total) by mouth every 8 (eight) hours as needed for nausea or vomiting. 02/14/21   Paseda, Baird Kay, FNP  promethazine-dextromethorphan (PROMETHAZINE-DM) 6.25-15 MG/5ML syrup Take 5 mLs by mouth 4 (four) times daily as needed. 09/14/22   Particia Nearing, PA-C  rosuvastatin (CRESTOR) 10 MG tablet Take 1 tablet (10 mg total) by mouth  daily. 02/20/23   Kerri Perches, MD  UNABLE TO FIND Med Name: Permaform bra  Dx: 174.9 Bilateral mastectomy 10/24/22   Kerri Perches, MD  venlafaxine XR (EFFEXOR-XR) 75 MG 24 hr capsule TAKE ONE CAPSULE BY MOUTH TWO TIMES DAILY 02/20/23   Kerri Perches, MD      Allergies    Moxifloxacin hcl, Penicillins, Colchicine, and Indomethacin    Review of Systems   Review of Systems  Respiratory:  Negative for shortness of breath.   Musculoskeletal:        Coccygeal discomfort  Skin:  Negative for wound.  Neurological:  Negative for headaches.  All other systems reviewed and are negative.   Physical Exam Updated Vital Signs BP (!) 142/55   Pulse 70   Temp 99.1 F (37.3 C) (Oral)   Resp 20   Ht 4\' 10"  (1.473 m)   Wt 70.3 kg   SpO2 99%   BMI 32.40 kg/m  Physical Exam Vitals and nursing note reviewed.  Constitutional:      Appearance: Normal appearance.  HENT:     Nose: Nose normal.  Eyes:     Pupils: Pupils are equal, round, and reactive to light.  Cardiovascular:     Rate and Rhythm: Normal rate.     Pulses: Normal pulses.  Pulmonary:     Effort: Pulmonary effort is normal.     Breath sounds: Normal breath sounds.  Abdominal:     Palpations: Abdomen is soft.  Musculoskeletal:        General: Normal range of motion.     Cervical back: Normal range of motion.  Skin:    General: Skin is warm and dry.  Neurological:     Mental Status: She is alert and oriented to person, place, and time.     Motor: No weakness.  Psychiatric:        Mood and Affect: Mood normal.        Behavior: Behavior normal.     ED Results / Procedures / Treatments   Labs (all labs ordered are listed, but only abnormal results are displayed) Labs Reviewed - No data to display  EKG None  Radiology No results found.  Procedures Procedures  {Document cardiac monitor, telemetry assessment procedure when appropriate:1}  Medications Ordered in ED Medications - No data to  display  ED Course/ Medical Decision Making/ A&P   {   Click here for ABCD2, HEART and other calculatorsREFRESH Note before signing :1}                              Medical Decision Making Amount and/or Complexity of Data Reviewed Radiology: ordered.   ***  {Document critical care time when appropriate:1} {Document review of labs and clinical decision tools ie heart score, Chads2Vasc2 etc:1}  {Document your independent review of  radiology images, and any outside records:1} {Document your discussion with family members, caretakers, and with consultants:1} {Document social determinants of health affecting pt's care:1} {Document your decision making why or why not admission, treatments were needed:1} Final Clinical Impression(s) / ED Diagnoses Final diagnoses:  None    Rx / DC Orders ED Discharge Orders     None

## 2023-02-27 NOTE — ED Provider Notes (Signed)
 [place] EMERGENCY DEPARTMENT AT [facility] Provider Note   CSN: [id number] Arrival date & time: 02/27/23  1146     History  Chief Complaint  Patient presents with   Amy Houston is a 78 y.o. female.  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 No loss of consciousness. She is on a daily aspirin. Denies neck and back pain.  The history is provided by the patient.  Fall This is a new problem. The current episode started 1 to 2 hours ago. Pertinent negatives include no headaches and no shortness of breath. She has tried nothing for the symptoms.       Home Medications Prior to Admission medications   Medication Sig Start Date End Date Taking? Authorizing Provider  acetaminophen (TYLENOL) 500 MG tablet Take 1,000 mg by mouth daily as needed for moderate pain or headache.     [provider]  albuterol (VENTOLIN HFA) 108 (90 Base) MCG/ACT inhaler Inhale 2 puffs into the lungs every 4 (four) hours as needed for wheezing or shortness of breath. 09/14/22   Particia Nearing, PA-C  aspirin EC 81 MG tablet Take 81 mg by mouth daily. Swallow whole.    [provider]  blood glucose meter kit and supplies Dispense based on patient and insurance preference. Once daily testing DX E11.9 05/06/21   Kerri Perches, MD  Chlorphen-PE-Acetaminophen (NOREL AD) 4-10-325 MG TABS Take 1 tablet by mouth 3 (three) times daily as needed (Nasal congestion). 10/20/21   Anabel Halon, MD  Cholecalciferol (VITAMIN D) 2000 units CAPS Take 6,000 Units by mouth at bedtime.     [provider]  clobetasol ointment (TEMOVATE) 0.05 % Apply 2 x daily to vulva area for 4 weeks then 2-3 x weekly 06/20/22   Adline Potter, NP  fluticasone (FLONASE) 50 MCG/ACT nasal spray Place 2 sprays into both  nostrils daily. 12/10/22   Leath-Warren, Sadie Haber, NP  glucose blood test strip Use as instructed daily dx e11.9 05/06/21   Kerri Perches, MD  JARDIANCE 25 MG TABS tablet Take 1 tablet by mouth daily before breakfast. 02/01/23   Kerri Perches, MD  Lancets 30G MISC Once daily testing dx e11.9 05/06/21   Kerri Perches, MD  levocetirizine (XYZAL) 5 MG tablet Take 1 tablet (5 mg total) by mouth at bedtime. 12/10/22   Leath-Warren, Sadie Haber, NP  lisinopril (ZESTRIL) 40 MG tablet Take 1 tablet (40 mg total) by mouth daily. 04/21/21   Donell Beers, FNP  metoprolol succinate (TOPROL-XL) 100 MG 24 hr tablet Take 1 tablet (100 mg total) by mouth daily. Please call [phone] to schedule an appointment for future refills. Thank you. 2nd attempt. 01/15/23   Kerri Perches, MD  metoprolol succinate (TOPROL-XL) 100 MG 24 hr tablet Take 1 tablet (100 mg total) by mouth daily. Take with or immediately following a meal. 01/30/23   Kerri Perches, MD  Misc Natural Products (TART CHERRY ADVANCED PO) Take by mouth. 1 daily    [provider]  Multiple Vitamin (MULTIVITAMIN WITH MINERALS) TABS Take 1 tablet by mouth at bedtime.     [provider]  nitroGLYCERIN (NITROSTAT) 0.4 MG SL tablet Place 0.4 mg under the tongue every 5 (five) minutes as needed for chest pain.  [provider]  omeprazole (PRILOSEC) 20 MG capsule TAKE ONE CAPSULE BY MOUTH ONCE DAILY. 06/01/21   Gelene Mink, NP  ondansetron (ZOFRAN ODT) 4 MG disintegrating tablet Take 1 tablet (4 mg total) by mouth every 8 (eight) hours as needed for nausea or vomiting. 02/14/21   Paseda, Baird Kay, FNP  promethazine-dextromethorphan (PROMETHAZINE-DM) 6.25-15 MG/5ML syrup Take 5 mLs by mouth 4 (four) times daily as needed. 09/14/22   Particia Nearing, PA-C  rosuvastatin (CRESTOR) 10 MG tablet Take 1 tablet (10 mg total) by mouth daily. 02/20/23   Kerri Perches, MD  UNABLE TO FIND Med Name:  Permaform bra  Dx: 174.9 Bilateral mastectomy 10/24/22   Kerri Perches, MD  venlafaxine XR (EFFEXOR-XR) 75 MG 24 hr capsule TAKE ONE CAPSULE BY MOUTH TWO TIMES DAILY 02/20/23   Kerri Perches, MD      Allergies    Moxifloxacin hcl, Penicillins, Colchicine, and Indomethacin    Review of Systems   Review of Systems  Respiratory:  Negative for shortness of breath.   Musculoskeletal:        Coccygeal discomfort  Skin:  Negative for wound.  Neurological:  Negative for headaches.  All other systems reviewed and are negative.   Physical Exam Updated Vital Signs BP (!) 142/55   Pulse 70   Temp 99.1 F (37.3 C) (Oral)   Resp 20   Ht 4\' 10"  (1.473 m)   Wt 70.3 kg   SpO2 99%   BMI 32.40 kg/m  Physical Exam Vitals and nursing note reviewed.  Constitutional:      Appearance: Normal appearance.  HENT:     Nose: Nose normal.  Eyes:     Pupils: Pupils are equal, round, and reactive to light.  Cardiovascular:     Rate and Rhythm: Normal rate.     Pulses: Normal pulses.  Pulmonary:     Effort: Pulmonary effort is normal.     Breath sounds: Normal breath sounds.  Abdominal:     Palpations: Abdomen is soft.  Musculoskeletal:        General: Normal range of motion.     Cervical back: Normal range of motion.  Skin:    General: Skin is warm and dry.  Neurological:     Mental Status: She is alert and oriented to person, place, and time.     Motor: No weakness.  Psychiatric:        Mood and Affect: Mood normal.        Behavior: Behavior normal.     ED Results / Procedures / Treatments   Labs (all labs ordered are listed, but only abnormal results are displayed) Labs Reviewed - No data to display  EKG None  Radiology No results found.  Procedures Procedures    Medications Ordered in ED Medications - No data to display  ED Course/ Medical Decision Making/ A&P                                 Medical Decision Making Amount and/or Complexity of Data  Reviewed Radiology: ordered.   78 year old patient presenting with minor head trauma and coccygeal pain after a mechanical fall. CT of head and imaging of coccyx do not reveal any acute abnormalities. Patient is well appearing. Patient monitored in ED and then discharged home. Patient to follow-up with primary care provider. Care instructions and return precautions provided.  Final Clinical Impression(s) / ED Diagnoses Final diagnoses:  Fall, initial encounter  Minor head injury, initial encounter  Coccygeal pain    Rx / DC Orders ED Discharge Orders     None         Felicie Morn, NP 02/28/23 0019    Loetta Rough, MD 03/09/23 [phone]

## 2023-02-27 NOTE — ED Triage Notes (Signed)
 Pt reports she was standing at a childs desk, she stepped back and tripped on a childs foot. She hit her tail bone and the back of her head. PT reports the trip was not intentional. PT denies LOC. Pt reports some dizziness right after the fall. Pt reports she takes aspirin every day and was instructed by EMS to come to the ED to be seen. Pt is A&OX4 at full baseline.

## 2023-02-27 NOTE — Discharge Instructions (Signed)
 Please refer to the attached instructions

## 2023-02-28 DIAGNOSIS — B078 Other viral warts: Secondary | ICD-10-CM | POA: Diagnosis not present

## 2023-03-07 ENCOUNTER — Encounter: Payer: Self-pay | Admitting: Genetic Counselor

## 2023-03-12 ENCOUNTER — Other Ambulatory Visit: Payer: Self-pay | Admitting: Family Medicine

## 2023-04-03 ENCOUNTER — Ambulatory Visit: Payer: PPO

## 2023-04-03 VITALS — BP 130/78 | Ht <= 58 in | Wt 152.0 lb

## 2023-04-03 DIAGNOSIS — Z0000 Encounter for general adult medical examination without abnormal findings: Secondary | ICD-10-CM | POA: Diagnosis not present

## 2023-04-03 DIAGNOSIS — Z2821 Immunization not carried out because of patient refusal: Secondary | ICD-10-CM

## 2023-04-03 NOTE — Patient Instructions (Signed)
 Ms. Bissette , Thank you for taking time to come for your Medicare Wellness Visit. I appreciate your ongoing commitment to your health goals. Please review the following plan we discussed and let me know if I can assist you in the future.   Referrals/Orders/Follow-Ups/Clinician Recommendations: Follow Up in one year.  This is a list of the screening recommended for you and due dates:  Health Maintenance  Topic Date Due   Zoster (Shingles) Vaccine (1 of 2) Never done   COVID-19 Vaccine (4 - 2024-25 season) 09/03/2022   Yearly kidney health urinalysis for diabetes  05/24/2023   Eye exam for diabetics  06/01/2023   Hemoglobin A1C  07/23/2023   Flu Shot  08/03/2023   Complete foot exam   09/11/2023   DTaP/Tdap/Td vaccine (2 - Td or Tdap) 11/22/2023   Yearly kidney function blood test for diabetes  01/23/2024   Medicare Annual Wellness Visit  04/02/2024   Pneumonia Vaccine  Completed   DEXA scan (bone density measurement)  Completed   Hepatitis C Screening  Completed   HPV Vaccine  Aged Out   Colon Cancer Screening  Discontinued    Advanced directives: (In Chart) A copy of your advanced directives are scanned into your chart should your provider ever need it.  Next Medicare Annual Wellness Visit scheduled for next year: Yes

## 2023-04-03 NOTE — Progress Notes (Signed)
 Because this visit was a virtual/telehealth visit,  certain criteria was not obtained, such a blood pressure, CBG if applicable, and timed get up and go. Any medications not marked as "taking" were not mentioned during the medication reconciliation part of the visit. Any vitals not documented were not able to be obtained due to this being a telehealth visit or patient was unable to self-report a recent blood pressure reading due to a lack of equipment at home via telehealth. Vitals that have been documented are verbally provided by the patient.   Subjective:   Amy Houston is a 78 y.o. who presents for a Medicare Wellness preventive visit.  Visit Complete: Virtual I connected with  [name] on 04/03/23 by a audio enabled telemedicine application and verified that I am speaking with the correct person using two identifiers.  Patient Location: Home  Provider Location: Home Office  I discussed the limitations of evaluation and management by telemedicine. The patient expressed understanding and agreed to proceed.  Vital Signs: Because this visit was a virtual/telehealth visit, some criteria may be missing or patient reported. Any vitals not documented were not able to be obtained and vitals that have been documented are patient reported.  VideoDeclined- This patient declined Librarian, academic. Therefore the visit was completed with audio only.  Persons Participating in Visit: Patient.  AWV Questionnaire: No: Patient Medicare AWV questionnaire was not completed prior to this visit.  Cardiac Risk Factors include: advanced age (>65men, >34 women);diabetes mellitus;hypertension;obesity (BMI >30kg/m2)     Objective:    Today's Vitals   04/03/23 1051  BP: 130/78  Weight: 152 lb (68.9 kg)  Height: 4\' 10"  (1.473 m)   Body mass index is 31.77 kg/m.     04/03/2023   10:50 AM 02/27/2023   12:18 PM 09/05/2022   10:30 AM 02/09/2022    3:33 PM 12/26/2021     7:16 AM 08/18/2021   11:50 AM 01/06/2021   11:23 AM  Advanced Directives  Does Patient Have a Medical Advance Directive? Yes Yes No Yes No No Yes  Type of Estate agent of [place];Living will Healthcare Power of [employer] of [place];Living will   Healthcare Power of [place];Living will  Does patient want to make changes to medical advance directive? No - Patient declined No - Guardian declined  Yes (Inpatient - patient defers changing a medical advance directive at this time - Information given)     Copy of Healthcare Power of Attorney in Chart? No - copy requested No - copy requested  Yes - validated most recent copy scanned in chart (See row information)   No - copy requested  Would patient like information on creating a medical advance directive? No - Patient declined  No - Patient declined  No - Patient declined No - Patient declined No - Patient declined    Current Medications (verified) Outpatient Encounter Medications as of 04/03/2023  Medication Sig   acetaminophen (TYLENOL) 500 MG tablet Take 1,000 mg by mouth daily as needed for moderate pain or headache.    albuterol (VENTOLIN HFA) 108 (90 Base) MCG/ACT inhaler Inhale 2 puffs into the lungs every 4 (four) hours as needed for wheezing or shortness of breath.   aspirin EC 81 MG tablet Take 81 mg by mouth daily. Swallow whole.   blood glucose meter kit and supplies Dispense based on patient and insurance preference. Once daily testing DX E11.9   Chlorphen-PE-Acetaminophen (NOREL AD) 4-10-325 MG TABS  Take 1 tablet by mouth 3 (three) times daily as needed (Nasal congestion).   Cholecalciferol (VITAMIN D) 2000 units CAPS Take 6,000 Units by mouth at bedtime.    clobetasol ointment (TEMOVATE) 0.05 % Apply 2 x daily to vulva area for 4 weeks then 2-3 x weekly   fluticasone (FLONASE) 50 MCG/ACT nasal spray Place 2 sprays into both nostrils daily.   glucose blood test strip Use as instructed daily dx e11.9    JARDIANCE 25 MG TABS tablet Take 1 tablet by mouth daily before breakfast.   Lancets 30G MISC Once daily testing dx e11.9   levocetirizine (XYZAL) 5 MG tablet Take 1 tablet (5 mg total) by mouth at bedtime.   lisinopril (ZESTRIL) 40 MG tablet Take 1 tablet (40 mg total) by mouth daily.   metoprolol succinate (TOPROL-XL) 100 MG 24 hr tablet Take 1 tablet (100 mg total) by mouth daily. Please call [phone] to schedule an appointment for future refills. Thank you. 2nd attempt.   metoprolol succinate (TOPROL-XL) 100 MG 24 hr tablet Take 1 tablet (100 mg total) by mouth daily. Take with or immediately following a meal.   Misc Natural Products (TART CHERRY ADVANCED PO) Take by mouth. 1 daily   Multiple Vitamin (MULTIVITAMIN WITH MINERALS) TABS Take 1 tablet by mouth at bedtime.    nitroGLYCERIN (NITROSTAT) 0.4 MG SL tablet Place 0.4 mg under the tongue every 5 (five) minutes as needed for chest pain.   omeprazole (PRILOSEC) 20 MG capsule TAKE ONE CAPSULE BY MOUTH ONCE DAILY.   ondansetron (ZOFRAN ODT) 4 MG disintegrating tablet Take 1 tablet (4 mg total) by mouth every 8 (eight) hours as needed for nausea or vomiting.   promethazine-dextromethorphan (PROMETHAZINE-DM) 6.25-15 MG/5ML syrup Take 5 mLs by mouth 4 (four) times daily as needed.   rosuvastatin (CRESTOR) 10 MG tablet Take 1 tablet (10 mg total) by mouth daily.   UNABLE TO FIND Med Name: Permaform bra  Dx: 174.9 Bilateral mastectomy   venlafaxine XR (EFFEXOR-XR) 75 MG 24 hr capsule TAKE ONE CAPSULE BY MOUTH TWO TIMES DAILY   No facility-administered encounter medications on file as of 04/03/2023.    Allergies (verified) Moxifloxacin hcl, Penicillins, Colchicine, and Indomethacin   History: Past Medical History:  Diagnosis Date   Abnormal genetic test 04/09/2013   PALB2 and CHEK2 positivity    Arthritis    "of the spine" (05/08/2012)   Breast cancer (HCC) 1995   "left" (05/08/2012)   Breast cancer, right breast, IDC, Multifocal  03/12/2012   Multifocal, 10 and 8 left breast    Depression, major, single episode, severe (HCC) 04/21/2021   Diabetes mellitus 1999   "dx; never on meds" (05/08/2012)   Diabetes mellitus without complication (HCC) 01/08/2019   Essential hypertension, benign 01/08/2019   Exertional shortness of breath    Family history of thyroid cancer    GERD (gastroesophageal reflux disease)    tums   H/O hiatal hernia    Hemorrhoid    History of blood transfusion    post child birth   Hyperlipidemia    IBS (irritable bowel syndrome)    Osteopenia 12/11/2012   On Ca++ and Vit D. Bone density in April 2014.  Next bone density due in April 2016.   PONV (postoperative nausea and vomiting)    PPD positive, treated 1999   Past Surgical History:  Procedure Laterality Date   ABDOMINAL HYSTERECTOMY Bilateral 01/30/2013   Procedure: HYSTERECTOMY ABDOMINAL;  Surgeon: Bing Plume, MD;  Location: [facility];  Service: Gynecology;  Laterality: Bilateral;   BREAST BIOPSY Left 1995   BREAST BIOPSY Bilateral 03/2012   "one on the left; 2 on the right" (05/08/2012)   BREAST LUMPECTOMY Left 1995   CHOLECYSTECTOMY  1992   COLONOSCOPY  12/06/93   Dr. Hayes:single diminutive polyp of the descending colon/extrernal hemorrhoids, benign path   COLONOSCOPY  11/30/98   Dr Hayes:small rectal polyp/internal hemorrhoids, benign path   COLONOSCOPY  11/26/2006   Dr. Hayes:sigmoid polyp/small interal hemorrhoids, adenomatous   COLONOSCOPY N/A 03/30/2016   Dr. Darrick Penna: normal TI, 4 mm rectal polyp, multiple small and large-mouthed diverticula in rectosigmoid and sigmoid colon, non-bleeding internal hemorrhoids, tubular adenoma on path. surveillance in 5 yeras   COLONOSCOPY WITH ESOPHAGOGASTRODUODENOSCOPY (EGD) N/A 07/01/2012   Dr. Darrick Penna: 2 small adenomas, sigmoid colon diverticula, surveillance in 2019   DILATION AND CURETTAGE OF UTERUS  1968   ESOPHAGEAL DILATION  07/01/2012   Dr. Darrick Penna: proximal esophageal web, Schatzki's ring  at [employer] junction, s/p dilaiton. Mild non-erosive gastritis. Repeat EGD July 23, 2012.    ESOPHAGOGASTRODUODENOSCOPY  1994   Dr. Matthias Hughs: small hiatal hernia and Schatzki's ring widely patent, CLO test negative   ESOPHAGOGASTRODUODENOSCOPY (EGD) WITH ESOPHAGEAL DILATION N/A 07/23/2012   Dr. Fields:proximal esophageal web v. radiation induced stricture/schatzki ring/small HH, s/p dilation   LEFT HEART CATH AND CORONARY ANGIOGRAPHY N/A 03/16/2020   Procedure: LEFT HEART CATH AND CORONARY ANGIOGRAPHY;  Surgeon: Yates Decamp, MD;  Location: [facility];  Service: Cardiovascular;  Laterality: N/A;   MASTECTOMY COMPLETE / SIMPLE Left 05/08/2012   MASTECTOMY COMPLETE / SIMPLE W/ SENTINEL NODE BIOPSY Right 05/08/2012   SALPINGOOPHORECTOMY Bilateral 01/30/2013   Procedure: SALPINGO OOPHORECTOMY;  Surgeon: Bing Plume, MD;  Location: [facility];  Service: Gynecology;  Laterality: Bilateral;   SIMPLE MASTECTOMY WITH AXILLARY SENTINEL NODE BIOPSY Right 05/08/2012   Procedure: RIGHT TOTAL MASTECTOMY WITH AXILLARY SENTINEL NODE BIOPSY;  Surgeon: Currie Paris, MD;  Location: [facility];  Service: General;  Laterality: Right;   SIMPLE MASTECTOMY WITH AXILLARY SENTINEL NODE BIOPSY Left 05/08/2012   Procedure: LEFT TOTAL MASTECTOMY;  Surgeon: Currie Paris, MD;  Location: [facility];  Service: General;  Laterality: Left;   TONSILLECTOMY AND ADENOIDECTOMY  ~ 2008   TUBAL LIGATION  1976   UVULOPALATOPHARYNGOPLASTY, TONSILLECTOMY AND SEPTOPLASTY  ~ 2008   Family History  Problem Relation Age of Onset   Lung cancer Mother 26       smoker   Depression Father    Leukemia Father 52       Hairy Cell at 92, CLL at 24   Lymphoma Father 71       Hodgkins lymphoma   Lung cancer Sister 50   Thyroid cancer Brother 87       Medullary   Lymphoma Brother 60       Follicular lymphoma   Cancer Daughter        thyroid   Other Daughter        leiomyoma of esophagus   Thyroid cancer Daughter    Cancer Daughter        breast    Breast cancer Daughter    Breast cancer Maternal Aunt        mother's paternal half sister   Colon cancer Maternal Aunt        mother's maternal half sister   Lung cancer Maternal Uncle        mother's paternal half brother   Cancer Paternal Uncle  oral cancer dx in his 58s   Lung cancer Maternal Grandmother        smoker   Lung cancer Cousin        paternal cousin   Breast cancer Cousin        paternal cousin died in her 43s   Heart attack Nephew    Social History   Socioeconomic History   Marital status: Married    Spouse name: Reita Cliche   Number of children: 2   Years of education: 12th grade   Highest education level: Not on file  Occupational History   Occupation: retired    Associate Professor: [employer]  Tobacco Use   Smoking status: Never   Smokeless tobacco: Never  Vaping Use   Vaping status: Never Used  Substance and Sexual Activity   Alcohol use: Not Currently    Comment: rare    Drug use: No   Sexual activity: Yes    Birth control/protection: Surgical    Comment: hyst  Other Topics Concern   Not on file  Social History Narrative   Married 56 yrs.Lives with her husband.Retired.   Social Drivers of Corporate investment banker Strain: Low Risk  (04/03/2023)   Overall Financial Resource Strain (CARDIA)    Difficulty of Paying Living Expenses: Not very hard  Food Insecurity: No Food Insecurity (04/03/2023)   Hunger Vital Sign    Worried About Running Out of Food in the Last Year: Never true    Ran Out of Food in the Last Year: Never true  Transportation Needs: No Transportation Needs (04/03/2023)   PRAPARE - Administrator, Civil Service (Medical): No    Lack of Transportation (Non-Medical): No  Physical Activity: Inactive (04/03/2023)   Exercise Vital Sign    Days of Exercise per Week: 0 days    Minutes of Exercise per Session: 0 min  Stress: No Stress Concern Present (04/03/2023)   [employer] of Occupational Health - Occupational  Stress Questionnaire    Feeling of Stress : Only a little  Social Connections: Socially Integrated (04/03/2023)   Social Connection and Isolation Panel [NHANES]    Frequency of Communication with Friends and Family: More than three times a week    Frequency of Social Gatherings with Friends and Family: More than three times a week    Attends Religious Services: More than 4 times per year    Active Member of [employer] or Organizations: Yes    Attends Banker Meetings: 1 to 4 times per year    Marital Status: Married    Tobacco Counseling Counseling given: Not Answered    Clinical Intake:  Pre-visit preparation completed: Yes  Pain : No/denies pain     BMI - recorded: 31.77 Nutritional Status: BMI > 30  Obese Nutritional Risks: None Diabetes: Yes CBG done?: No Did pt. bring in CBG monitor from home?: No  Lab Results  Component Value Date   HGBA1C 6.6 (H) 01/23/2023   HGBA1C 6.5 (H) 09/06/2022   HGBA1C 6.8 (H) 03/14/2022     What is the last grade level you completed in school?: some college  Interpreter Needed?: No  Information entered by :: Isidoro Donning   Activities of Daily Living     04/03/2023   10:57 AM  In your present state of health, do you have any difficulty performing the following activities:  Hearing? 0  Vision? 0  Difficulty concentrating or making decisions? 0  Walking or climbing stairs? 0  Dressing or bathing? 0  Doing errands, shopping? 0  Preparing Food and eating ? N  Using the Toilet? N  In the past six months, have you accidently leaked urine? N  Do you have problems with loss of bowel control? N  Managing your Medications? N  Managing your Finances? N  Housekeeping or managing your Housekeeping? N    Patient Care Team: Kerri Perches, MD as PCP - General (Family Medicine) Glenford Peers, MD as Consulting Physician (Hematology and Oncology) West Bali, MD (Inactive) as Attending Physician  (Gastroenterology) Lanelle Bal, DO as Consulting Physician (Gastroenterology)  Indicate any recent Medical Services you may have received from other than [facility] providers in the past year (date may be approximate).     Assessment:   This is a routine wellness examination for Rayleen.  Hearing/Vision screen Hearing Screening - Comments:: No problems hearing  Vision Screening - Comments:: Patient wears reading glasses   Goals Addressed               This Visit's Progress     Patient Stated (pt-stated)        To lose weight        Depression Screen     04/03/2023   11:14 AM 01/30/2023    4:02 PM 09/08/2022    4:09 PM 06/20/2022   11:11 AM 05/24/2022    4:13 PM 03/27/2022    4:34 PM 03/15/2022   11:09 AM  PHQ 2/9 Scores  PHQ - 2 Score 0 0 0 0 0 0 0  PHQ- 9 Score 0  0 1  1     Fall Risk     04/03/2023   10:54 AM 01/30/2023    4:02 PM 09/08/2022    4:09 PM 06/20/2022   11:11 AM 05/24/2022    4:13 PM  Fall Risk   Falls in the past year? 0 0 1 1 0  Number falls in past yr: 0 0 0 0 0  Injury with Fall? 0 0 1 1 0  Risk for fall due to : No Fall Risks No Fall Risks   No Fall Risks  Follow up Falls prevention discussed;Falls evaluation completed Falls evaluation completed   Falls evaluation completed    MEDICARE RISK AT HOME:  Medicare Risk at Home Any stairs in or around the home?: No If so, are there any without handrails?: No Home free of loose throw rugs in walkways, pet beds, electrical cords, etc?: Yes Adequate lighting in your home to reduce risk of falls?: Yes Life alert?: No Use of a cane, walker or w/c?: No Grab bars in the bathroom?: No Shower chair or bench in shower?: No Elevated toilet seat or a handicapped toilet?: No  TIMED UP AND GO:  Was the test performed?  No  Cognitive Function: 6CIT completed    07/26/2021   11:37 AM  MMSE - Mini Mental State Exam  Orientation to time 5  Orientation to Place 5  Registration 3  Attention/ Calculation 5   Recall 3  Language- name 2 objects 2  Language- repeat 1  Language- follow 3 step command 3  Language- read & follow direction 1  Write a sentence 1  Copy design 1  Total score 30        04/03/2023   10:52 AM 02/09/2022    3:34 PM 01/06/2021   11:27 AM  6CIT Screen  What Year? 0 points 0 points 0 points  What month? 0 points 0  points 0 points  What time? 0 points 0 points 0 points  Count back from 20 0 points 0 points 0 points  Months in reverse 0 points 0 points 0 points  Repeat phrase 0 points 0 points 6 points  Total Score 0 points 0 points 6 points    Immunizations Immunization History  Administered Date(s) Administered   Fluad Quad(high Dose 65+) 10/01/2019, 12/16/2020, 12/06/2021   Influenza Split 10/16/2012   Influenza, High Dose Seasonal PF 10/12/2018   Influenza-Unspecified 10/16/2013, 10/02/2014   PFIZER(Purple Top)SARS-COV-2 Vaccination 01/23/2019, 02/14/2019, 11/13/2019   Pneumococcal Conjugate-13 01/08/2019   Pneumococcal Polysaccharide-23 04/29/2020   Tdap 11/21/2013    Screening Tests Health Maintenance  Topic Date Due   Zoster Vaccines- Shingrix (1 of 2) Never done   COVID-19 Vaccine (4 - 2024-25 season) 09/03/2022   Diabetic kidney evaluation - Urine ACR  05/24/2023   OPHTHALMOLOGY EXAM  06/01/2023   HEMOGLOBIN A1C  07/23/2023   INFLUENZA VACCINE  08/03/2023   FOOT EXAM  09/11/2023   DTaP/Tdap/Td (2 - Td or Tdap) 11/22/2023   Diabetic kidney evaluation - eGFR measurement  01/23/2024   Medicare Annual Wellness (AWV)  04/02/2024   Pneumonia Vaccine 18+ Years old  Completed   DEXA SCAN  Completed   Hepatitis C Screening  Completed   HPV VACCINES  Aged Out   Colonoscopy  Discontinued    Health Maintenance  Health Maintenance Due  Topic Date Due   Zoster Vaccines- Shingrix (1 of 2) Never done   COVID-19 Vaccine (4 - 2024-25 season) 09/03/2022   Health Maintenance Items Addressed:patient declined vaccines at the moment   Additional  Screening:  Vision Screening: Recommended annual ophthalmology exams for early detection of glaucoma and other disorders of the eye.  Dental Screening: Recommended annual dental exams for proper oral hygiene  Community Resource Referral / Chronic Care Management: CRR required this visit?  No   CCM required this visit?  No     Plan:     I have personally reviewed and noted the following in the patient's chart:   Medical and social history Use of alcohol, tobacco or illicit drugs  Current medications and supplements including opioid prescriptions. Patient is not currently taking opioid prescriptions. Functional ability and status Nutritional status Physical activity Advanced directives List of other physicians Hospitalizations, surgeries, and ER visits in previous 12 months Vitals Screenings to include cognitive, depression, and falls Referrals and appointments  In addition, I have reviewed and discussed with patient certain preventive protocols, quality metrics, and best practice recommendations. A written personalized care plan for preventive services as well as general preventive health recommendations were provided to patient.     Rudi Heap, [place]   04/03/2023   After Visit Summary: (MyChart) Due to this being a telephonic visit, the after visit summary with patients personalized plan was offered to patient via MyChart   Notes: Nothing significant to report at this time.

## 2023-04-04 ENCOUNTER — Other Ambulatory Visit: Payer: Self-pay | Admitting: Family Medicine

## 2023-04-09 ENCOUNTER — Other Ambulatory Visit: Payer: Self-pay | Admitting: Family Medicine

## 2023-05-07 ENCOUNTER — Ambulatory Visit
Admission: RE | Admit: 2023-05-07 | Discharge: 2023-05-07 | Disposition: A | Discharge status: Home / Self Care | Source: Ambulatory Visit | Attending: Nurse Practitioner | Admitting: Nurse Practitioner

## 2023-05-07 VITALS — BP 144/73 | HR 55 | Temp 98.2°F | Resp 16

## 2023-05-07 DIAGNOSIS — Z8709 Personal history of other diseases of the respiratory system: Secondary | ICD-10-CM

## 2023-05-07 DIAGNOSIS — J209 Acute bronchitis, unspecified: Secondary | ICD-10-CM

## 2023-05-07 MED ORDER — AZITHROMYCIN 250 MG PO TABS: ORAL_TABLET | ORAL | 0 refills | Status: DC

## 2023-05-07 MED ORDER — METHYLPREDNISOLONE ACETATE 40 MG/ML IJ SUSP
40.0000 mg | Freq: Once | INTRAMUSCULAR | Status: AC
  Administered 2023-05-07: 40 mg via INTRAMUSCULAR

## 2023-05-07 MED ORDER — IPRATROPIUM-ALBUTEROL 0.5-2.5 (3) MG/3ML IN SOLN
3.0000 mL | Freq: Once | RESPIRATORY_TRACT | Status: AC
  Administered 2023-05-07: 3 mL via RESPIRATORY_TRACT

## 2023-05-07 MED ORDER — PREDNISONE 20 MG PO TABS: 40.0000 mg | ORAL_TABLET | Freq: Every day | ORAL | 0 refills | Status: AC

## 2023-05-07 MED ORDER — ALBUTEROL SULFATE HFA 108 (90 BASE) MCG/ACT IN AERS
2.0000 | INHALATION_SPRAY | RESPIRATORY_TRACT | 0 refills | Status: AC | PRN
Start: 2023-05-07 — End: ?

## 2023-05-07 NOTE — ED Triage Notes (Signed)
 Pt reports cough, nasal congestion, chest congestion and wheezing  x 6 days has tried home remedies but has founs no relief

## 2023-05-07 NOTE — ED Provider Notes (Signed)
 [facility]    CSN: [id number] Arrival date & time: 05/07/23  1428      History   Chief Complaint No chief complaint on file.   HPI Amy Houston is a 78 y.o. female.   Patient presents today with 6 day history of congested cough, shortness of breath, wheezing, stuffy nose, sore throat, and fatigue.  She denies fever, body aches or chills, chest pain, headache, ear pain, abdominal pain, nausea/vomiting, diarrhea, and change in appetite.  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  Has been taking over-the-counter Mucinex and cough drops for symptoms with minimal temporary improvement.  Reports she gets bronchitis frequently when she is sick.  Denies history of smoking or vaping.  Denies history of chronic lung disease diagnosis.    Past Medical History:  Diagnosis Date   Abnormal genetic test 04/09/2013   PALB2 and CHEK2 positivity    Arthritis    "of the spine" (05/08/2012)   Breast cancer (HCC) 1995   "left" (05/08/2012)   Breast cancer, right breast, IDC, Multifocal 03/12/2012   Multifocal, 10 and 8 left breast    Depression, major, single episode, severe (HCC) 04/21/2021   Diabetes mellitus 1999   "dx; never on meds" (05/08/2012)   Diabetes mellitus without complication (HCC) 01/08/2019   Essential hypertension, benign 01/08/2019   Exertional shortness of breath    Family history of thyroid  cancer    GERD (gastroesophageal reflux disease)    tums   H/O hiatal hernia    Hemorrhoid    History of blood transfusion    post child birth   Hyperlipidemia    IBS (irritable bowel syndrome)    Osteopenia 12/11/2012   On Ca++ and Vit D. Bone density in April 2014.  Next bone density due in April 2016.   PONV (postoperative nausea and vomiting)    PPD positive, treated 1999    Patient Active Problem List   Diagnosis Date Noted   Hyperlipidemia LDL goal <100 01/30/2023   Nightmares 01/30/2023    PALB2 positive 12/15/2022   Annual physical exam 09/11/2022   Lichen sclerosus et atrophicus 06/20/2022   Vaginitis 05/28/2022   Scalp laceration, subsequent encounter 01/08/2022   Bruise, trunk, subsequent encounter 01/08/2022   Encounter for removal of staples 01/08/2022   Allergic rhinitis 01/08/2022   Coronary artery ectasia (HCC) 10/03/2021   MCI (mild cognitive impairment) 07/26/2021   FH: dementia 07/26/2021   Anxiety 05/22/2021   Positive PPD, treated 05/20/2021   Acute gout 05/06/2021   Gout 04/21/2021   Blood in the stool 12/16/2020   Long-term current use of bisphosphonate 08/12/2020   Dyspnea on exertion    Angina pectoris (HCC) 03/15/2020   Class 1 obesity with serious comorbidity and body mass index (BMI) of 33.0 to 33.9 in adult 09/04/2019   Family history of thyroid  cancer 09/04/2019   Vitamin D  deficiency disease 07/24/2019   Primary hypertension 01/08/2019   Type 2 diabetes mellitus with other specified complication (HCC) 01/08/2019   Diverticulitis of colon 03/08/2015   Acquired trigger finger 05/06/2013   Abnormal genetic test 04/09/2013   Osteopenia 12/11/2012   Thyroid  nodule 06/21/2012   Gastroesophageal reflux disease without esophagitis 06/03/2012   Adenomatous polyps 06/03/2012   Breast cancer, right breast, IDC, Multifocal 03/12/2012   Popliteal cyst 08/18/2010   Hx Left breast cancer, UOQ, IDC, receptor negative 11/08/1993    Past Surgical History:  Procedure Laterality  Date   ABDOMINAL HYSTERECTOMY Bilateral 01/30/2013   Procedure: HYSTERECTOMY ABDOMINAL;  Surgeon: Romilda Coaster, MD;  Location: [facility];  Service: Gynecology;  Laterality: Bilateral;   BREAST BIOPSY Left 1995   BREAST BIOPSY Bilateral 03/2012   "one on the left; 2 on the right" (05/08/2012)   BREAST LUMPECTOMY Left 1995   CHOLECYSTECTOMY  1992   COLONOSCOPY  12/06/93   Dr. Hayes:single diminutive polyp of the descending colon/extrernal hemorrhoids, benign path   COLONOSCOPY   11/30/98   Dr Hayes:small rectal polyp/internal hemorrhoids, benign path   COLONOSCOPY  11/26/2006   Dr. Hayes:sigmoid polyp/small interal hemorrhoids, adenomatous   COLONOSCOPY N/A 03/30/2016   Dr. Nolene Baumgarten: normal TI, 4 mm rectal polyp, multiple small and large-mouthed diverticula in rectosigmoid and sigmoid colon, non-bleeding internal hemorrhoids, tubular adenoma on path. surveillance in 5 yeras   COLONOSCOPY WITH ESOPHAGOGASTRODUODENOSCOPY (EGD) N/A 07/01/2012   Dr. Nolene Baumgarten: 2 small adenomas, sigmoid colon diverticula, surveillance in 2019   DILATION AND CURETTAGE OF UTERUS  1968   ESOPHAGEAL DILATION  07/01/2012   Dr. Nolene Baumgarten: proximal esophageal web, Schatzki's ring at [employer] junction, s/p dilaiton. Mild non-erosive gastritis. Repeat EGD July 23, 2012.    ESOPHAGOGASTRODUODENOSCOPY  1994   Dr. Dellis Fermo: small hiatal hernia and Schatzki's ring widely patent, CLO test negative   ESOPHAGOGASTRODUODENOSCOPY (EGD) WITH ESOPHAGEAL DILATION N/A 07/23/2012   Dr. Fields:proximal esophageal web v. radiation induced stricture/schatzki ring/small HH, s/p dilation   LEFT HEART CATH AND CORONARY ANGIOGRAPHY N/A 03/16/2020   Procedure: LEFT HEART CATH AND CORONARY ANGIOGRAPHY;  Surgeon: Knox Perl, MD;  Location: [facility];  Service: Cardiovascular;  Laterality: N/A;   MASTECTOMY COMPLETE / SIMPLE Left 05/08/2012   MASTECTOMY COMPLETE / SIMPLE W/ SENTINEL NODE BIOPSY Right 05/08/2012   SALPINGOOPHORECTOMY Bilateral 01/30/2013   Procedure: SALPINGO OOPHORECTOMY;  Surgeon: Romilda Coaster, MD;  Location: [facility];  Service: Gynecology;  Laterality: Bilateral;   SIMPLE MASTECTOMY WITH AXILLARY SENTINEL NODE BIOPSY Right 05/08/2012   Procedure: RIGHT TOTAL MASTECTOMY WITH AXILLARY SENTINEL NODE BIOPSY;  Surgeon: Darcella Earnest, MD;  Location: [facility];  Service: General;  Laterality: Right;   SIMPLE MASTECTOMY WITH AXILLARY SENTINEL NODE BIOPSY Left 05/08/2012   Procedure: LEFT TOTAL MASTECTOMY;  Surgeon: Darcella Earnest, MD;  Location: [facility];  Service: General;  Laterality: Left;   TONSILLECTOMY AND ADENOIDECTOMY  ~ 2008   TUBAL LIGATION  1976   UVULOPALATOPHARYNGOPLASTY, TONSILLECTOMY AND SEPTOPLASTY  ~ 2008    OB History     Gravida  3   Para  2   Term  2   Preterm      AB  1   Living  2      SAB  1   IAB      Ectopic      Multiple      Live Births  2            Home Medications    Prior to Admission medications   Medication Sig Start Date End Date Taking? Authorizing Provider  azithromycin  (ZITHROMAX ) 250 MG tablet Take (2) tablets by mouth on day 1, then take (1) tablet by mouth on days 2-5. 05/07/23  Yes Wilhemena Harbour, NP  predniSONE  (DELTASONE ) 20 MG tablet Take 2 tablets (40 mg total) by mouth daily with breakfast for 5 days. 05/07/23 05/12/23 Yes Wilhemena Harbour, NP  acetaminophen  (TYLENOL ) 500 MG tablet Take 1,000 mg by mouth daily as needed for moderate pain or headache.  [provider]  albuterol  (VENTOLIN  HFA) 108 (90 Base) MCG/ACT inhaler Inhale 2 puffs into the lungs every 4 (four) hours as needed for wheezing or shortness of breath. 05/07/23   Wilhemena Harbour, NP  aspirin  EC 81 MG tablet Take 81 mg by mouth daily. Swallow whole.    [provider]  blood glucose meter kit and supplies Dispense based on patient and insurance preference. Once daily testing DX E11.9 05/06/21   Towanda Fret, MD  Chlorphen-PE-Acetaminophen  (NOREL AD) 4-10-325 MG TABS Take 1 tablet by mouth 3 (three) times daily as needed (Nasal congestion). 10/20/21   Meldon Sport, MD  Cholecalciferol (VITAMIN D ) 2000 units CAPS Take 6,000 Units by mouth at bedtime.     [provider]  clobetasol  ointment (TEMOVATE ) 0.05 % Apply 2 x daily to vulva area for 4 weeks then 2-3 x weekly 06/20/22   Javan Messing, NP  fluticasone  (FLONASE ) 50 MCG/ACT nasal spray Place 2 sprays into both nostrils daily. 12/10/22   Leath-Warren, Belen Bowers, NP  glucose  blood test strip Use as instructed daily dx e11.9 05/06/21   Towanda Fret, MD  JARDIANCE  25 MG TABS tablet Take 1 tablet by mouth daily before breakfast. 04/09/23   Towanda Fret, MD  Lancets 30G MISC Once daily testing dx e11.9 05/06/21   Towanda Fret, MD  levocetirizine (XYZAL ) 5 MG tablet Take 1 tablet (5 mg total) by mouth at bedtime. 12/10/22   Leath-Warren, Belen Bowers, NP  lisinopril  (ZESTRIL ) 40 MG tablet Take 1 tablet (40 mg total) by mouth daily. 04/21/21   Paseda, Folashade R, FNP  metoprolol  succinate (TOPROL -XL) 100 MG 24 hr tablet Take 1 tablet (100 mg total) by mouth daily. Please call [phone] to schedule an appointment for future refills. Thank you. 2nd attempt. 01/15/23   Towanda Fret, MD  metoprolol  succinate (TOPROL -XL) 100 MG 24 hr tablet Take 1 tablet (100 mg total) by mouth daily. Take with or immediately following a meal. 01/30/23   Towanda Fret, MD  Misc Natural Products (TART CHERRY ADVANCED PO) Take by mouth. 1 daily    [provider]  Multiple Vitamin (MULTIVITAMIN WITH MINERALS) TABS Take 1 tablet by mouth at bedtime.     [provider]  nitroGLYCERIN  (NITROSTAT ) 0.4 MG SL tablet Place 0.4 mg under the tongue every 5 (five) minutes as needed for chest pain.    [provider]  omeprazole  (PRILOSEC) 20 MG capsule TAKE ONE CAPSULE BY MOUTH ONCE DAILY. 06/01/21   Delman Ferns, NP  ondansetron  (ZOFRAN  ODT) 4 MG disintegrating tablet Take 1 tablet (4 mg total) by mouth every 8 (eight) hours as needed for nausea or vomiting. 02/14/21   Paseda, Folashade R, FNP  promethazine -dextromethorphan (PROMETHAZINE -DM) 6.25-15 MG/5ML syrup Take 5 mLs by mouth 4 (four) times daily as needed. 09/14/22   Corbin Dess, PA-C  rosuvastatin  (CRESTOR ) 10 MG tablet Take 1 tablet (10 mg total) by mouth daily. 02/20/23   Towanda Fret, MD  UNABLE TO FIND Med Name: Permaform bra  Dx: 174.9 Bilateral mastectomy 10/24/22   Towanda Fret, MD  venlafaxine  XR (EFFEXOR -XR) 75 MG 24 hr capsule TAKE ONE CAPSULE BY MOUTH TWO TIMES DAILY 03/12/23   Towanda Fret, MD    Family History Family History  Problem Relation Age of Onset   Lung cancer Mother 44       smoker   Depression Father    Leukemia Father 2  Hairy Cell at 67, CLL at 72   Lymphoma Father 63       Hodgkins lymphoma   Lung cancer Sister 73   Thyroid  cancer Brother 37       Medullary   Lymphoma Brother 60       Follicular lymphoma   Cancer Daughter        thyroid    Other Daughter        leiomyoma of esophagus   Thyroid  cancer Daughter    Cancer Daughter        breast   Breast cancer Daughter    Breast cancer Maternal Aunt        mother's paternal half sister   Colon cancer Maternal Aunt        mother's maternal half sister   Lung cancer Maternal Uncle        mother's paternal half brother   Cancer Paternal Uncle        oral cancer dx in his 12s   Lung cancer Maternal Grandmother        smoker   Lung cancer Cousin        paternal cousin   Breast cancer Cousin        paternal cousin died in her 70s   Heart attack Nephew     Social History Social History   Tobacco Use   Smoking status: Never   Smokeless tobacco: Never  Vaping Use   Vaping status: Never Used  Substance Use Topics   Alcohol use: Not Currently    Comment: rare    Drug use: No     Allergies   Moxifloxacin  hcl, Penicillins, Colchicine , and Indomethacin    Review of Systems Review of Systems Per HPI  Physical Exam Triage Vital Signs ED Triage Vitals  Encounter Vitals Group     BP 05/07/23 1514 (!) 144/73     Systolic BP Percentile --      Diastolic BP Percentile --      Pulse Rate 05/07/23 1514 (!) 55     Resp 05/07/23 1514 16     Temp 05/07/23 1514 98.2 F (36.8 C)     Temp Source 05/07/23 1514 Oral     SpO2 05/07/23 1514 93 %     Weight --      Height --      Head Circumference --      Peak Flow --      Pain Score 05/07/23 1517 0      Pain Loc --      Pain Education --      Exclude from Growth Chart --    No data found.  Updated Vital Signs BP (!) 144/73 (BP Location: Right Arm)   Pulse (!) 55   Temp 98.2 F (36.8 C) (Oral)   Resp 16   SpO2 93%   Visual Acuity Right Eye Distance:   Left Eye Distance:   Bilateral Distance:    Right Eye Near:   Left Eye Near:    Bilateral Near:     Physical Exam Vitals and nursing note reviewed.  Constitutional:      General: She is not in acute distress.    Appearance: Normal appearance. She is not ill-appearing or toxic-appearing.  HENT:     Head: Normocephalic and atraumatic.     Right Ear: Tympanic membrane, ear canal and external ear normal.     Left Ear: Tympanic membrane, ear canal and external ear normal.     Nose: No  congestion or rhinorrhea.     Mouth/Throat:     Mouth: Mucous membranes are moist.     Pharynx: Oropharynx is clear. No oropharyngeal exudate or posterior oropharyngeal erythema.  Eyes:     General: No scleral icterus.    Extraocular Movements: Extraocular movements intact.  Cardiovascular:     Rate and Rhythm: Regular rhythm. Bradycardia present.  Pulmonary:     Effort: Pulmonary effort is normal. No respiratory distress.     Breath sounds: Wheezing present. No rhonchi or rales.  Musculoskeletal:     Cervical back: Normal range of motion and neck supple.  Lymphadenopathy:     Cervical: No cervical adenopathy.  Skin:    General: Skin is warm and dry.     Coloration: Skin is not jaundiced or pale.     Findings: No erythema or rash.  Neurological:     Mental Status: She is alert and oriented to person, place, and time.  Psychiatric:        Behavior: Behavior is cooperative.      UC Treatments / Results  Labs (all labs ordered are listed, but only abnormal results are displayed) Labs Reviewed - No data to display  EKG   Radiology No results found.  Procedures Procedures (including critical care time)  Medications Ordered  in UC Medications  methylPREDNISolone acetate (DEPO-MEDROL) injection 40 mg (40 mg Intramuscular Given 05/07/23 1558)  ipratropium-albuterol  (DUONEB) 0.5-2.5 (3) MG/3ML nebulizer solution 3 mL (3 mLs Nebulization Given 05/07/23 1558)    Initial Impression / Assessment and Plan / UC Course  I have reviewed the triage vital signs and the nursing notes.  Pertinent labs & imaging results that were available during my care of the patient were reviewed by me and considered in my medical decision making (see chart for details).   Patient is well-appearing, normotensive, afebrile, not tachycardic, not tachypneic, oxygenating well on room air.    1. Acute bronchitis, unspecified organism 2. History of acute bronchitis with bronchospasm Overall, vitals and exam are reassuring today Given wheezing, DuoNeb and Depo-Medrol IM given in urgent care today SpO2 increased from 93% to 94% on room air; increase in air movement bilaterally after breathing treatment Given history of acute bronchitis with viral infections, do suspect possible underlying chronic bronchitis, therefore will treat with oral prednisone  starting tomorrow, azithromycin , and albuterol  inhaler scheduled for 48 hours, then as needed Strict ER and return precautions discussed with patient  The patient was given the opportunity to ask questions.  All questions answered to their satisfaction.  The patient is in agreement to this plan.   Final Clinical Impressions(s) / UC Diagnoses   Final diagnoses:  Acute bronchitis, unspecified organism  History of acute bronchitis with bronchospasm     Discharge Instructions      You are being treated today for bronchitis.  We have given you a breathing treatment and shot of steroid medicine today which helped increase your oxygen level and helped with the air movement.  Please continue the albuterol  breathing medicine have sent to the pharmacy scheduled every 6 hours for the next 48 hours to help with  lung inflammation.  Start the oral prednisone  pills tomorrow.  In addition, recommend taking azithromycin  as prescribed to treat for lung infection..  Symptoms should improve over the next week to 10 days.  If symptoms worsen, go to the emergency room.  Some things that can make you feel better are: - Increased rest - Increasing fluid with water /sugar free electrolytes - Acetaminophen   and ibuprofen as needed for fever/pain - Salt water  gargling, chloraseptic spray and throat lozenges - OTC guaifenesin (Mucinex) 600 mg twice daily - Saline sinus flushes or a neti pot - Humidifying the air     ED Prescriptions     Medication Sig Dispense Auth. Provider   albuterol  (VENTOLIN  HFA) 108 (90 Base) MCG/ACT inhaler Inhale 2 puffs into the lungs every 4 (four) hours as needed for wheezing or shortness of breath. 18 g Thena Fireman A, NP   azithromycin  (ZITHROMAX ) 250 MG tablet Take (2) tablets by mouth on day 1, then take (1) tablet by mouth on days 2-5. 6 tablet Thena Fireman A, NP   predniSONE  (DELTASONE ) 20 MG tablet Take 2 tablets (40 mg total) by mouth daily with breakfast for 5 days. 10 tablet Wilhemena Harbour, NP      PDMP not reviewed this encounter.   Wilhemena Harbour, NP 05/08/23 1148

## 2023-05-07 NOTE — Discharge Instructions (Signed)
 You are being treated today for bronchitis.  We have given you a breathing treatment and shot of steroid medicine today which helped increase your oxygen level and helped with the air movement.  Please continue the albuterol  breathing medicine have sent to the pharmacy scheduled every 6 hours for the next 48 hours to help with lung inflammation.  Start the oral prednisone  pills tomorrow.  In addition, recommend taking azithromycin  as prescribed to treat for lung infection..  Symptoms should improve over the next week to 10 days.  If symptoms worsen, go to the emergency room.  Some things that can make you feel better are: - Increased rest - Increasing fluid with water /sugar free electrolytes - Acetaminophen  and ibuprofen as needed for fever/pain - Salt water  gargling, chloraseptic spray and throat lozenges - OTC guaifenesin (Mucinex) 600 mg twice daily - Saline sinus flushes or a neti pot - Humidifying the air

## 2023-05-15 ENCOUNTER — Other Ambulatory Visit: Payer: Self-pay | Admitting: Family Medicine

## 2023-05-21 ENCOUNTER — Other Ambulatory Visit: Payer: Self-pay | Admitting: Family Medicine

## 2023-06-07 DIAGNOSIS — E113291 Type 2 diabetes mellitus with mild nonproliferative diabetic retinopathy without macular edema, right eye: Secondary | ICD-10-CM | POA: Diagnosis not present

## 2023-06-07 DIAGNOSIS — I10 Essential (primary) hypertension: Secondary | ICD-10-CM | POA: Diagnosis not present

## 2023-06-07 DIAGNOSIS — E1159 Type 2 diabetes mellitus with other circulatory complications: Secondary | ICD-10-CM | POA: Diagnosis not present

## 2023-06-07 DIAGNOSIS — E785 Hyperlipidemia, unspecified: Secondary | ICD-10-CM | POA: Diagnosis not present

## 2023-06-07 DIAGNOSIS — E559 Vitamin D deficiency, unspecified: Secondary | ICD-10-CM | POA: Diagnosis not present

## 2023-06-08 ENCOUNTER — Encounter: Payer: Self-pay | Admitting: Family Medicine

## 2023-06-08 ENCOUNTER — Ambulatory Visit: Payer: PPO | Admitting: Family Medicine

## 2023-06-08 VITALS — BP 119/69 | HR 65 | Resp 18 | Ht <= 58 in | Wt 156.1 lb

## 2023-06-08 DIAGNOSIS — D126 Benign neoplasm of colon, unspecified: Secondary | ICD-10-CM | POA: Diagnosis not present

## 2023-06-08 DIAGNOSIS — E1169 Type 2 diabetes mellitus with other specified complication: Secondary | ICD-10-CM | POA: Diagnosis not present

## 2023-06-08 DIAGNOSIS — K219 Gastro-esophageal reflux disease without esophagitis: Secondary | ICD-10-CM

## 2023-06-08 DIAGNOSIS — F419 Anxiety disorder, unspecified: Secondary | ICD-10-CM

## 2023-06-08 DIAGNOSIS — Z853 Personal history of malignant neoplasm of breast: Secondary | ICD-10-CM

## 2023-06-08 DIAGNOSIS — I10 Essential (primary) hypertension: Secondary | ICD-10-CM | POA: Diagnosis not present

## 2023-06-08 MED ORDER — VENLAFAXINE HCL ER 75 MG PO CP24: 75.0000 mg | ORAL_CAPSULE | Freq: Every day | ORAL | 5 refills | Status: DC

## 2023-06-08 NOTE — Patient Instructions (Addendum)
 Annual exam  mid October , call if you need me sooner  You are referred to GI for colonoscopy and GERD  Effexor  dose is reduced to once daily   Need to send for eye exam from Dr Barbra Boone  Pls get shingrix vaccines x 2 if you have not had them, no record in system  It is important that you exercise regularly at least 30 minutes 5 times a week. If you develop chest pain, have severe difficulty breathing, or feel very tired, stop exercising immediately and seek medical attention   Think about what you will eat, plan ahead. Choose " clean, green, fresh or frozen" over canned, processed or packaged foods which are more sugary, salty and fatty. 70 to 75% of food eaten should be vegetables and fruit. Three meals at set times with snacks allowed between meals, but they must be fruit or vegetables. Aim to eat over a 12 hour period , example 7 am to 7 pm, and STOP after  your last meal of the day. Drink water ,generally about 64 ounces per day, no other drink is as healthy. Fruit juice is best enjoyed in a healthy way, by EATING the fruit.  Thanks for choosing [facility], we consider it a privelige to serve you.

## 2023-06-09 ENCOUNTER — Ambulatory Visit: Payer: Self-pay | Admitting: Family Medicine

## 2023-06-09 ENCOUNTER — Encounter: Payer: Self-pay | Admitting: Family Medicine

## 2023-06-09 LAB — LIPID PANEL

## 2023-06-09 NOTE — Assessment & Plan Note (Signed)
 Reports uncontrolled symptoms at times will refer to GI

## 2023-06-09 NOTE — Assessment & Plan Note (Signed)
 Controlled, no change in medication DASH diet and commitment to daily physical activity for a minimum of 30 minutes discussed and encouraged, as a part of hypertension management. The importance of attaining a healthy weight is also discussed.     06/08/2023    4:22 PM 05/07/2023    3:14 PM 04/03/2023   10:51 AM 02/27/2023    4:27 PM 02/27/2023   12:16 PM 01/30/2023    4:01 PM 12/10/2022   10:48 AM  BP/Weight  Systolic BP 119 144 130 130 142 131 129  Diastolic BP 69 73 78 78 55 76 59  Wt. (Lbs) 156.12  152  155 156.12   BMI 32.63 kg/m2  31.77 kg/m2  32.4 kg/m2 32.63 kg/m2

## 2023-06-09 NOTE — Assessment & Plan Note (Signed)
 Controlled, no change in medication Diabetes associated with hypertension, hyperlipidemia, and obesity  Ms. Yaun is reminded of the importance of commitment to daily physical activity for 30 minutes or more, as able and the need to limit carbohydrate intake to 30 to 60 grams per meal to help with blood sugar control.   The need to take medication as prescribed, test blood sugar as directed, and to call between visits if there is a concern that blood sugar is uncontrolled is also discussed.   Ms. Bossman is reminded of the importance of daily foot exam, annual eye examination, and good blood sugar, blood pressure and cholesterol control.     Latest Ref Rng & Units 06/07/2023   10:57 AM 01/23/2023   10:28 AM 09/06/2022    8:42 AM 08/17/2022   11:02 AM 05/24/2022    5:05 PM  Diabetic Labs  HbA1c 4.8 - 5.6 % 6.9  6.6  6.5     Micro/Creat Ratio 0 - 29 mg/g creat 7     10   Chol  WILL FOLLOW  P  113     HDL  WILL FOLLOW  P  43     Calc LDL  WILL FOLLOW  P  49     Triglycerides  WILL FOLLOW  P  119     Creatinine  WILL FOLLOW  P 0.92  0.92  0.96      P Preliminary result      06/08/2023    4:22 PM 05/07/2023    3:14 PM 04/03/2023   10:51 AM 02/27/2023    4:27 PM 02/27/2023   12:16 PM 01/30/2023    4:01 PM 12/10/2022   10:48 AM  BP/Weight  Systolic BP 119 144 130 130 142 131 129  Diastolic BP 69 73 78 78 55 76 59  Wt. (Lbs) 156.12  152  155 156.12   BMI 32.63 kg/m2  31.77 kg/m2  32.4 kg/m2 32.63 kg/m2       Latest Ref Rng & Units 06/01/2022   12:00 AM 04/21/2021   10:00 AM  Foot/eye exam completion dates  Eye Exam No Retinopathy No Retinopathy       Foot Form Completion   Done     This result is from an external source.

## 2023-06-09 NOTE — Assessment & Plan Note (Addendum)
 Decrease dose of effexor   to 75 mg once daily, with a view to weaning off if no longer needed

## 2023-06-09 NOTE — Assessment & Plan Note (Signed)
 Followed by oncology, has  mammogram already scheduled

## 2023-06-09 NOTE — Progress Notes (Signed)
 Amy Houston     MRN: [id number]      DOB: 1945/11/22  Chief Complaint  Patient presents with   Medical Management of Chronic Issues    5 month follow up     HPI Amy Houston is here for follow up and re-evaluation of chronic medical conditions, medication management and review of any available recent lab and radiology data.  Preventive health is updated, specifically  Cancer screening and Immunization.   Treated and has fully recovered from acute bronchitis, treated at [facility] 05/07/2023. Amy Houston on the job in Feb, had ED eval which was negative for significant injury The PT denies any adverse reactions to current medications since the last visit.  Has concerns about "long term safety " of medication, has been on effexor  for over 15 years, her depression and anxiety and learning how to live a retired life that is fulfilling and getting along with her spouse have all improved since her sessions with a Therapist Denies polyuria, polydipsia, blurred vision , or hypoglycemic episodes.   ROS Denies recent fever or chills. Denies sinus pressure, nasal congestion, ear pain or sore throat. Denies chest congestion, productive cough or wheezing. Denies chest pains, palpitations and leg swelling C/o intermittent heartburn abdominal pain, nausea, vomiting,diarrhea or constipation.   Denies dysuria, frequency, hesitancy or incontinence. Denies  uncontrolled joint pain, swelling and limitation in mobility. Denies headaches, seizures, numbness, or tingling. Denies uncontrolled depression, anxiety or insomnia. Denies skin break down or rash.   PE  BP 119/69   Pulse 65   Resp 18   Ht 4\' 10"  (1.473 m)   Wt 156 lb 1.9 oz (70.8 kg)   SpO2 95%   BMI 32.63 kg/m   Patient alert and oriented and in no cardiopulmonary distress.  HEENT: No facial asymmetry, EOMI,     Neck supple .  Chest: Clear to auscultation bilaterally.  CVS: S1, S2 no murmurs, no S3.Regular rate.  Ext: No edema  MS:  Adequate  though reduced ROM spine, shoulders, hips and knees.  Skin: Intact, no ulcerations or rash noted.  Psych: Good eye contact, normal affect. Memory intact not anxious or depressed appearing.  CNS: CN 2-12 intact, power,  normal throughout.no focal deficits noted.   Assessment & Plan  Primary hypertension Controlled, no change in medication DASH diet and commitment to daily physical activity for a minimum of 30 minutes discussed and encouraged, as a part of hypertension management. The importance of attaining a healthy weight is also discussed.     06/08/2023    4:22 PM 05/07/2023    3:14 PM 04/03/2023   10:51 AM 02/27/2023    4:27 PM 02/27/2023   12:16 PM 01/30/2023    4:01 PM 12/10/2022   10:48 AM  BP/Weight  Systolic BP 119 144 130 130 142 131 129  Diastolic BP 69 73 78 78 55 76 59  Wt. (Lbs) 156.12  152  155 156.12   BMI 32.63 kg/m2  31.77 kg/m2  32.4 kg/m2 32.63 kg/m2        Type 2 diabetes mellitus with other specified complication (HCC) Controlled, no change in medication Diabetes associated with hypertension, hyperlipidemia, and obesity  Amy Houston is reminded of the importance of commitment to daily physical activity for 30 minutes or more, as able and the need to limit carbohydrate intake to 30 to 60 grams per meal to help with blood sugar control.   The need to take medication as prescribed, test blood sugar as directed,  and to call between visits if there is a concern that blood sugar is uncontrolled is also discussed.   Amy Houston is reminded of the importance of daily foot exam, annual eye examination, and good blood sugar, blood pressure and cholesterol control.     Latest Ref Rng & Units 06/07/2023   10:57 AM 01/23/2023   10:28 AM 09/06/2022    8:42 AM 08/17/2022   11:02 AM 05/24/2022    5:05 PM  Diabetic Labs  HbA1c 4.8 - 5.6 % 6.9  6.6  6.5     Micro/Creat Ratio 0 - 29 mg/g creat 7     10   Chol  WILL FOLLOW  P  113     HDL  WILL FOLLOW  P  43     Calc  LDL  WILL FOLLOW  P  49     Triglycerides  WILL FOLLOW  P  119     Creatinine  WILL FOLLOW  P 0.92  0.92  0.96      P Preliminary result      06/08/2023    4:22 PM 05/07/2023    3:14 PM 04/03/2023   10:51 AM 02/27/2023    4:27 PM 02/27/2023   12:16 PM 01/30/2023    4:01 PM 12/10/2022   10:48 AM  BP/Weight  Systolic BP 119 144 130 130 142 131 129  Diastolic BP 69 73 78 78 55 76 59  Wt. (Lbs) 156.12  152  155 156.12   BMI 32.63 kg/m2  31.77 kg/m2  32.4 kg/m2 32.63 kg/m2       Latest Ref Rng & Units 06/01/2022   12:00 AM 04/21/2021   10:00 AM  Foot/eye exam completion dates  Eye Exam No Retinopathy No Retinopathy       Foot Form Completion   Done     This result is from an external source.        Gastroesophageal reflux disease without esophagitis Reports uncontrolled symptoms at times will refer to GI  Hx Left breast cancer, UOQ, IDC, receptor negative Followed by oncology, has  mammogram already scheduled  Anxiety Decrease dose of effexor   to 75 mg once daily, with a view to weaning off if no longer needed

## 2023-06-12 ENCOUNTER — Telehealth: Payer: Self-pay | Admitting: Pharmacist

## 2023-06-12 DIAGNOSIS — E1169 Type 2 diabetes mellitus with other specified complication: Secondary | ICD-10-CM

## 2023-06-12 MED ORDER — EMPAGLIFLOZIN 25 MG PO TABS: 25.0000 mg | ORAL_TABLET | Freq: Every day | ORAL | 0 refills | Status: DC

## 2023-06-12 NOTE — Telephone Encounter (Signed)
 This patient is appearing on a report for being at risk of failing the adherence measure for diabetes medications this calendar year.   Medication: Jardiance  Last fill date: 05/21/23 for 30 day supply  Contacted pharmacy to facilitate refills. and Will collaborate with provider to facilitate refill needs.  [name], PharmD, BCACP, CPP Clinical Pharmacist, [facility]

## 2023-06-13 LAB — HEMOGLOBIN A1C
Est. average glucose Bld gHb Est-mCnc: 151 mg/dL
Hgb A1c MFr Bld: 6.9 % — ABNORMAL HIGH (ref 4.8–5.6)

## 2023-06-13 LAB — CBC
Hematocrit: 41.6 % (ref 34.0–46.6)
Hemoglobin: 13.3 g/dL (ref 11.1–15.9)
MCH: 30.8 pg (ref 26.6–33.0)
MCHC: 32 g/dL (ref 31.5–35.7)
MCV: 96 fL (ref 79–97)
Platelets: 295 10*3/uL (ref 150–450)
RBC: 4.32 x10E6/uL (ref 3.77–5.28)
RDW: 12.9 % (ref 11.7–15.4)
WBC: 7 10*3/uL (ref 3.4–10.8)

## 2023-06-13 LAB — CMP14+EGFR
ALT: 12 IU/L (ref 0–32)
AST: 13 IU/L (ref 0–40)
Albumin: 4.5 g/dL (ref 3.8–4.8)
Alkaline Phosphatase: 80 IU/L (ref 44–121)
BUN/Creatinine Ratio: 19 (ref 12–28)
BUN: 21 mg/dL (ref 8–27)
Bilirubin Total: 0.2 mg/dL (ref 0.0–1.2)
CO2: 20 mmol/L (ref 20–29)
Calcium: 9.6 mg/dL (ref 8.7–10.3)
Chloride: 104 mmol/L (ref 96–106)
Creatinine, Ser: 1.1 mg/dL — ABNORMAL HIGH (ref 0.57–1.00)
Globulin, Total: 2.2 g/dL (ref 1.5–4.5)
Glucose: 127 mg/dL — ABNORMAL HIGH (ref 70–99)
Potassium: 4.3 mmol/L (ref 3.5–5.2)
Sodium: 140 mmol/L (ref 134–144)
Total Protein: 6.7 g/dL (ref 6.0–8.5)
eGFR: 52 mL/min/{1.73_m2} — ABNORMAL LOW (ref >59)

## 2023-06-13 LAB — LIPID PANEL
Cholesterol, Total: 133 mg/dL (ref 100–199)
HDL: 45 mg/dL (ref >39)
LDL CALC COMMENT:: 3 ratio (ref 0.0–4.4)
LDL Chol Calc (NIH): 63 mg/dL (ref 0–99)
Triglycerides: 142 mg/dL (ref 0–149)
VLDL Cholesterol Cal: 25 mg/dL (ref 5–40)

## 2023-06-13 LAB — MICROALBUMIN / CREATININE URINE RATIO
Creatinine, Urine: 90.1 mg/dL
Microalb/Creat Ratio: 7 mg/g{creat} (ref 0–29)
Microalbumin, Urine: 5.9 ug/mL

## 2023-06-13 LAB — VITAMIN D 25 HYDROXY (VIT D DEFICIENCY, FRACTURES): Vit D, 25-Hydroxy: 41.8 ng/mL (ref 30.0–100.0)

## 2023-06-14 ENCOUNTER — Other Ambulatory Visit: Payer: Self-pay | Admitting: Family Medicine

## 2023-06-25 ENCOUNTER — Other Ambulatory Visit: Payer: Self-pay | Admitting: Gastroenterology

## 2023-06-28 ENCOUNTER — Other Ambulatory Visit: Payer: Self-pay | Admitting: Nurse Practitioner

## 2023-06-28 DIAGNOSIS — I10 Essential (primary) hypertension: Secondary | ICD-10-CM

## 2023-07-03 ENCOUNTER — Ambulatory Visit: Admitting: Gastroenterology

## 2023-07-03 ENCOUNTER — Encounter: Payer: Self-pay | Admitting: *Deleted

## 2023-07-03 ENCOUNTER — Encounter: Payer: Self-pay | Admitting: Gastroenterology

## 2023-07-03 ENCOUNTER — Other Ambulatory Visit: Payer: Self-pay | Admitting: *Deleted

## 2023-07-03 VITALS — BP 134/47 | HR 51 | Temp 98.6°F | Ht <= 58 in | Wt 159.8 lb

## 2023-07-03 DIAGNOSIS — D369 Benign neoplasm, unspecified site: Secondary | ICD-10-CM

## 2023-07-03 DIAGNOSIS — Z8719 Personal history of other diseases of the digestive system: Secondary | ICD-10-CM

## 2023-07-03 DIAGNOSIS — Z860101 Personal history of adenomatous and serrated colon polyps: Secondary | ICD-10-CM | POA: Diagnosis not present

## 2023-07-03 DIAGNOSIS — K219 Gastro-esophageal reflux disease without esophagitis: Secondary | ICD-10-CM | POA: Diagnosis not present

## 2023-07-03 DIAGNOSIS — R1310 Dysphagia, unspecified: Secondary | ICD-10-CM | POA: Insufficient documentation

## 2023-07-03 MED ORDER — SUTAB 1479-225-188 MG PO TABS: ORAL_TABLET | ORAL | 0 refills | Status: DC

## 2023-07-03 MED ORDER — OMEPRAZOLE 20 MG PO CPDR: 20.0000 mg | DELAYED_RELEASE_CAPSULE | Freq: Every day | ORAL | 3 refills | Status: AC

## 2023-07-03 NOTE — Patient Instructions (Signed)
 I have refilled omeprazole  for you.  We are arranging a colonoscopy, upper endoscopy, and dilation with Dr. Cindie in the near future!  You will need to hold Jardiance  72 hours prior to the procedure.  We will see you back in 1 year or sooner if needed!  I enjoyed seeing you again today! I value our relationship and want to provide genuine, compassionate, and quality care. You may receive a survey regarding your visit with me, and I welcome your feedback! Thanks so much for taking the time to complete this. I look forward to seeing you again.      Therisa MICAEL Stager, PhD, ANP-BC [facility]

## 2023-07-03 NOTE — Progress Notes (Signed)
 Gastroenterology Office Note     Primary Care Physician:  [name], MD  Primary Gastroenterologist: Dr. Cindie    Chief Complaint   Chief Complaint  Patient presents with   Follow-up    Follow up GERD and time for colonoscopy. Pt needs refills on her Omeprazole  and Ondansetron        History of Present Illness   Amy Houston is a 78 y.o. female presenting today with a history of diverticulitis in the past, GERD, adenomas with surveillance that was due in 2023. She was last here in May 2022 for routine visit.   GERD: omeprazole  20 mg daily. After eating will have to clear her throat at times. Schatzki ring dilations in the past with good results. No solid food dysphagia. Feels things may be stuck in upper throat at times. Would like an EGD at time of colonoscopy and dilation if needed.   No changes in bowel habits. Rare if constipated. Occasionally looser stool, stating she has a hx of IBS. This is at her baseline. Fatty foods will trigger a looser, urgent stool such as rib-eye, bacon.  No unexplained weight loss. No abdominal pain. No unexplained lack of appetite.    Colonoscopy 2018: normal TI, 4 mm rectal tubular adenoma, diverticula, non-bleeding internal hemorrhoids.  No FH colon cancer.   Past Medical History:  Diagnosis Date   Abnormal genetic test 04/09/2013   PALB2 and CHEK2 positivity    Arthritis    of the spine (05/08/2012)   Breast cancer (HCC) 1995   left (05/08/2012)   Breast cancer, right breast, IDC, Multifocal 03/12/2012   Multifocal, 10 and 8 left breast    Depression, major, single episode, severe (HCC) 04/21/2021   Diabetes mellitus 1999   dx; never on meds (05/08/2012)   Diabetes mellitus without complication (HCC) 01/08/2019   Essential hypertension, benign 01/08/2019   Exertional shortness of breath    Family history of thyroid  cancer    GERD (gastroesophageal reflux disease)    tums   H/O hiatal hernia    Hemorrhoid     History of blood transfusion    post child birth   Hyperlipidemia    IBS (irritable bowel syndrome)    Osteopenia 12/11/2012   On Ca++ and Vit D. Bone density in April 2014.  Next bone density due in April 2016.   PONV (postoperative nausea and vomiting)    PPD positive, treated 1999    Past Surgical History:  Procedure Laterality Date   ABDOMINAL HYSTERECTOMY Bilateral 01/30/2013   Procedure: HYSTERECTOMY ABDOMINAL;  Surgeon: Debby JULIANNA Lares, MD;  Location: [facility];  Service: Gynecology;  Laterality: Bilateral;   BREAST BIOPSY Left 1995   BREAST BIOPSY Bilateral 03/2012   one on the left; 2 on the right (05/08/2012)   BREAST LUMPECTOMY Left 1995   CHOLECYSTECTOMY  1992   COLONOSCOPY  12/06/93   Dr. Hayes:single diminutive polyp of the descending colon/extrernal hemorrhoids, benign path   COLONOSCOPY  11/30/98   Dr Hayes:small rectal polyp/internal hemorrhoids, benign path   COLONOSCOPY  11/26/2006   Dr. Hayes:sigmoid polyp/small interal hemorrhoids, adenomatous   COLONOSCOPY N/A 03/30/2016   Dr. Harvey: normal TI, 4 mm rectal polyp, multiple small and large-mouthed diverticula in rectosigmoid and sigmoid colon, non-bleeding internal hemorrhoids, tubular adenoma on path. surveillance in 5 yeras   COLONOSCOPY WITH ESOPHAGOGASTRODUODENOSCOPY (EGD) N/A 07/01/2012   Dr. Harvey: 2 small adenomas, sigmoid colon diverticula, surveillance in 2019   DILATION AND CURETTAGE OF UTERUS  1968   ESOPHAGEAL DILATION  07/01/2012   Dr. Harvey: proximal esophageal web, Schatzki's ring at GE junction, s/p dilaiton. Mild non-erosive gastritis. Repeat EGD July 23, 2012.    ESOPHAGOGASTRODUODENOSCOPY  1994   Dr. Donnald: small hiatal hernia and Schatzki's ring widely patent, CLO test negative   ESOPHAGOGASTRODUODENOSCOPY (EGD) WITH ESOPHAGEAL DILATION N/A 07/23/2012   Dr. Fields:proximal esophageal web v. radiation induced stricture/schatzki ring/small HH, s/p dilation   LEFT HEART CATH AND CORONARY ANGIOGRAPHY  N/A 03/16/2020   Procedure: LEFT HEART CATH AND CORONARY ANGIOGRAPHY;  Surgeon: Ladona Heinz, MD;  Location: [facility];  Service: Cardiovascular;  Laterality: N/A;   MASTECTOMY COMPLETE / SIMPLE Left 05/08/2012   MASTECTOMY COMPLETE / SIMPLE W/ SENTINEL NODE BIOPSY Right 05/08/2012   SALPINGOOPHORECTOMY Bilateral 01/30/2013   Procedure: SALPINGO OOPHORECTOMY;  Surgeon: Debby JULIANNA Lares, MD;  Location: [facility];  Service: Gynecology;  Laterality: Bilateral;   SIMPLE MASTECTOMY WITH AXILLARY SENTINEL NODE BIOPSY Right 05/08/2012   Procedure: RIGHT TOTAL MASTECTOMY WITH AXILLARY SENTINEL NODE BIOPSY;  Surgeon: Sherlean JINNY Laughter, MD;  Location: [facility];  Service: General;  Laterality: Right;   SIMPLE MASTECTOMY WITH AXILLARY SENTINEL NODE BIOPSY Left 05/08/2012   Procedure: LEFT TOTAL MASTECTOMY;  Surgeon: Sherlean JINNY Laughter, MD;  Location: [facility];  Service: General;  Laterality: Left;   TONSILLECTOMY AND ADENOIDECTOMY  ~ 2008   TUBAL LIGATION  1976   UVULOPALATOPHARYNGOPLASTY, TONSILLECTOMY AND SEPTOPLASTY  ~ 2008    Current Outpatient Medications  Medication Sig Dispense Refill   acetaminophen  (TYLENOL ) 500 MG tablet Take 1,000 mg by mouth daily as needed for moderate pain or headache.      albuterol  (VENTOLIN  HFA) 108 (90 Base) MCG/ACT inhaler Inhale 2 puffs into the lungs every 4 (four) hours as needed for wheezing or shortness of breath. 18 g 0   aspirin  EC 81 MG tablet Take 81 mg by mouth daily. Swallow whole.     Chlorphen-PE-Acetaminophen  (NOREL AD) 4-10-325 MG TABS Take 1 tablet by mouth 3 (three) times daily as needed (Nasal congestion). 30 tablet 0   Cholecalciferol (VITAMIN D ) 2000 units CAPS Take 6,000 Units by mouth at bedtime.      clobetasol  ointment (TEMOVATE ) 0.05 % Apply 2 x daily to vulva area for 4 weeks then 2-3 x weekly 30 g 3   empagliflozin  (JARDIANCE ) 25 MG TABS tablet Take 1 tablet (25 mg total) by mouth daily before breakfast. 30 tablet 0   fluticasone  (FLONASE ) 50 MCG/ACT  nasal spray Place 2 sprays into both nostrils daily. 16 g 0   glucose blood test strip Use as instructed daily dx e11.9 100 each 12   Lancets 30G MISC Once daily testing dx e11.9 100 each 5   lisinopril  (ZESTRIL ) 40 MG tablet Take 1 tablet (40 mg total) by mouth daily. 90 tablet 3   metoprolol  succinate (TOPROL -XL) 100 MG 24 hr tablet Take 1 tablet (100 mg total) by mouth daily. Please call [phone] to schedule an appointment for future refills. Thank you. 2nd attempt. 15 tablet 0   metoprolol  succinate (TOPROL -XL) 100 MG 24 hr tablet Take 1 tablet (100 mg total) by mouth daily. Take with or immediately following a meal. 90 tablet 2   Multiple Vitamin (MULTIVITAMIN WITH MINERALS) TABS Take 1 tablet by mouth at bedtime.      nitroGLYCERIN  (NITROSTAT ) 0.4 MG SL tablet Place 0.4 mg under the tongue every 5 (five) minutes as needed for chest pain.     omeprazole  (PRILOSEC) 20 MG  capsule TAKE ONE CAPSULE BY MOUTH ONCE DAILY. 90 capsule 3   ondansetron  (ZOFRAN  ODT) 4 MG disintegrating tablet Take 1 tablet (4 mg total) by mouth every 8 (eight) hours as needed for nausea or vomiting. 20 tablet 0   rosuvastatin  (CRESTOR ) 10 MG tablet Take 1 tablet (10 mg total) by mouth daily. 90 tablet 0   venlafaxine  XR (EFFEXOR  XR) 75 MG 24 hr capsule Take 1 capsule (75 mg total) by mouth daily with breakfast. 30 capsule 5   blood glucose meter kit and supplies Dispense based on patient and insurance preference. Once daily testing DX E11.9 (Patient not taking: Reported on 07/03/2023) 1 each 0   levocetirizine (XYZAL ) 5 MG tablet Take 1 tablet (5 mg total) by mouth at bedtime. (Patient not taking: Reported on 07/03/2023) 30 tablet 0   Misc Natural Products (TART CHERRY ADVANCED PO) Take by mouth. 1 daily (Patient not taking: Reported on 07/03/2023)     UNABLE TO FIND Med Name: Permaform bra  Dx: 174.9 Bilateral mastectomy 1 each 2   No current facility-administered medications for this visit.    Allergies as of  07/03/2023 - Review Complete 07/03/2023  Allergen Reaction Noted   Moxifloxacin  hcl Rash 12/06/2021   Penicillins Rash 08/18/2010   Colchicine  Other (See Comments) 05/06/2021   Indomethacin  Other (See Comments) 05/06/2021    Family History  Problem Relation Age of Onset   Lung cancer Mother 14       smoker   Depression Father    Leukemia Father 67       Hairy Cell at 72, CLL at 61   Lymphoma Father 59       Hodgkins lymphoma   Lung cancer Sister 41   Thyroid  cancer Brother 96       Medullary   Lymphoma Brother 60       Follicular lymphoma   Cancer Daughter        thyroid    Other Daughter        leiomyoma of esophagus   Thyroid  cancer Daughter    Cancer Daughter        breast   Breast cancer Daughter    Breast cancer Maternal Aunt        mother's paternal half sister   Colon cancer Maternal Aunt        mother's maternal half sister   Lung cancer Maternal Uncle        mother's paternal half brother   Cancer Paternal Uncle        oral cancer dx in his 43s   Lung cancer Maternal Grandmother        smoker   Lung cancer Cousin        paternal cousin   Breast cancer Cousin        paternal cousin died in her 30s   Heart attack Nephew     Social History   Socioeconomic History   Marital status: Married    Spouse name: Beryl   Number of children: 2   Years of education: 12th grade   Highest education level: Not on file  Occupational History   Occupation: retired    Associate Professor: [employer]  Tobacco Use   Smoking status: Never   Smokeless tobacco: Never  Vaping Use   Vaping status: Never Used  Substance and Sexual Activity   Alcohol use: Not Currently    Comment: rare    Drug use: No   Sexual activity: Yes    Birth  control/protection: Surgical    Comment: hyst  Other Topics Concern   Not on file  Social History Narrative   Married 56 yrs.Lives with her husband.Retired.   Social Drivers of Corporate investment banker Strain: Low Risk  (04/03/2023)    Overall Financial Resource Strain (CARDIA)    Difficulty of Paying Living Expenses: Not very hard  Food Insecurity: No Food Insecurity (04/03/2023)   Hunger Vital Sign    Worried About Running Out of Food in the Last Year: Never true    Ran Out of Food in the Last Year: Never true  Transportation Needs: No Transportation Needs (04/03/2023)   PRAPARE - Administrator, Civil Service (Medical): No    Lack of Transportation (Non-Medical): No  Physical Activity: Inactive (04/03/2023)   Exercise Vital Sign    Days of Exercise per Week: 0 days    Minutes of Exercise per Session: 0 min  Stress: No Stress Concern Present (04/03/2023)   [employer] of Occupational Health - Occupational Stress Questionnaire    Feeling of Stress : Only a little  Social Connections: Socially Integrated (04/03/2023)   Social Connection and Isolation Panel    Frequency of Communication with Friends and Family: More than three times a week    Frequency of Social Gatherings with Friends and Family: More than three times a week    Attends Religious Services: More than 4 times per year    Active Member of [employer] or Organizations: Yes    Attends Banker Meetings: 1 to 4 times per year    Marital Status: Married  Catering manager Violence: Not At Risk (04/03/2023)   Humiliation, Afraid, Rape, and Kick questionnaire    Fear of Current or Ex-Partner: No    Emotionally Abused: No    Physically Abused: No    Sexually Abused: No     Review of Systems   Gen: Denies any fever, chills, fatigue, weight loss, lack of appetite.  CV: Denies chest pain, heart palpitations, peripheral edema, syncope.  Resp: Denies shortness of breath at rest or with exertion. Denies wheezing or cough.  GI: Denies dysphagia or odynophagia. Denies jaundice, hematemesis, fecal incontinence. GU : Denies urinary burning, urinary frequency, urinary hesitancy MS: Denies joint pain, muscle weakness, cramps, or limitation of  movement.  Derm: Denies rash, itching, dry skin Psych: Denies depression, anxiety, memory loss, and confusion Heme: Denies bruising, bleeding, and enlarged lymph nodes.   Physical Exam   BP (!) 134/47   Pulse (!) 51   Temp 98.6 F (37 C)   Ht 4' 10 (1.473 m)   Wt 159 lb 12.8 oz (72.5 kg)   BMI 33.40 kg/m  General:   Alert and oriented. Pleasant and cooperative. Well-nourished and well-developed.  Head:  Normocephalic and atraumatic. Eyes:  Without icterus Abdomen:  +BS, soft, non-tender and non-distended. No HSM noted. No guarding or rebound. No masses appreciated.  Rectal:  Deferred  Msk:  Symmetrical without gross deformities. Normal posture. Extremities:  Without edema. Neurologic:  Alert and  oriented x4;  grossly normal neurologically. Skin:  Intact without significant lesions or rashes. Psych:  Alert and cooperative. Normal mood and affect.   Assessment   Amy Houston is a 78 y.o. female presenting today with a history of diverticulitis in the past, GERD, adenomas with surveillance that was due in 2023. She was last here in May 2022 for routine visit.   Adenomas: overdue for surveillance. No concerning lower GI signs/symptoms. Although  she is over the age 33, she is overall healthy and would like one more colonoscopy.   GERD: doing well on omeprazole  20 mg daily. Although clearing her throat at times, she denies any solid food dysphagia. May have oropharyngeal component as notes food feels stuck in upper area at times. Prior hx of Schatki's ring s/p dilation with good results. Requests EGD and dilation as appropriate at time of colonoscopy.     PLAN   Proceed with colonoscopy/EGD/dilation by Dr. Cindie  in near future: the risks, benefits, and alternatives have been discussed with the patient in detail. The patient states understanding and desires to proceed.  Hold Jardiance  72 hours prior Refilled omperazole 1 year recall for follow-up   Amy MICAEL Stager, PhD,  ANP-BC [facility]

## 2023-07-10 ENCOUNTER — Telehealth: Payer: Self-pay | Admitting: *Deleted

## 2023-07-10 NOTE — Telephone Encounter (Signed)
 Spoke with pt. She rescheduled her 7/28 procedure to 8/19. Message sent to endo and new instructions to be sent to patient.

## 2023-07-10 NOTE — Telephone Encounter (Signed)
 LMOVM to return call   Pt left vm stating she needed to reschedule her procedure on 7/28

## 2023-07-13 ENCOUNTER — Other Ambulatory Visit: Payer: Self-pay | Admitting: Adult Health

## 2023-07-26 ENCOUNTER — Other Ambulatory Visit (HOSPITAL_COMMUNITY)

## 2023-07-27 ENCOUNTER — Telehealth: Payer: Self-pay

## 2023-07-27 ENCOUNTER — Other Ambulatory Visit: Payer: Self-pay | Admitting: Family Medicine

## 2023-07-27 DIAGNOSIS — I10 Essential (primary) hypertension: Secondary | ICD-10-CM

## 2023-07-27 MED ORDER — LISINOPRIL 40 MG PO TABS: 40.0000 mg | ORAL_TABLET | Freq: Every day | ORAL | 3 refills | Status: AC

## 2023-07-27 NOTE — Telephone Encounter (Signed)
 Copied from CRM [id number]. Topic: Clinical - Medication Refill >> Jul 27, 2023  1:42 PM Tiffini S wrote: Medication: lisinopril  (ZESTRIL ) 40 MG tablet  Has the patient contacted their pharmacy? Yes, pharmacy have been faxing the request (Agent: If no, request that the patient contact the pharmacy for the refill. If patient does not wish to contact the pharmacy document the reason why and proceed with request.) (Agent: If yes, when and what did the pharmacy advise?)  This is the patient's preferred pharmacy:  [facility] [place], [place] - [id number] [place] [address] Phone: [phone] Fax: [phone]    Is this the correct pharmacy for this prescription? Yes If no, delete pharmacy and type the correct one.   Has the prescription been filled recently? Yes  Is the patient out of the medication? Yes, medication is need today or no later than tomorrow   Has the patient been seen for an appointment in the last year OR does the patient have an upcoming appointment? Yes  Can we respond through MyChart? No, patient is asking for a call at [phone]  Agent: Please be advised that Rx refills may take up to 3 business days. We ask that you follow-up with your pharmacy.

## 2023-07-27 NOTE — Telephone Encounter (Signed)
Refilled x1 year

## 2023-07-27 NOTE — Telephone Encounter (Signed)
 Pt requesting refill for Lisinopril  40mg  but this medication was last filled in 2023 by outside provider. Please advise

## 2023-07-27 NOTE — Addendum Note (Signed)
 Addended by: [name] on: 07/27/2023 02:49 PM   Modules accepted: Orders

## 2023-08-15 ENCOUNTER — Telehealth: Payer: Self-pay

## 2023-08-15 ENCOUNTER — Other Ambulatory Visit (HOSPITAL_COMMUNITY): Payer: PPO

## 2023-08-15 ENCOUNTER — Inpatient Hospital Stay: Payer: PPO

## 2023-08-15 NOTE — Progress Notes (Signed)
   08/15/2023  Patient ID: Amy Houston, female   DOB: [birth year], 78 y.o.   MRN: [id number]  This patient is appearing on a report for being at risk of failing the adherence measure for identified medications this calendar year.   Medication Adherence Summary (STAR/HEDIS Monitoring): Adherence Category: diabetes    Drug Name: Jardiance  25 mg  Last Fill or Sold Date:06/25/2023 (confirmed via Dr. Annemarie and [facility]) Days' Supply: 30      Notes: ? Adherence data pulled from pharmacy claims portal Dr. Annemarie. [facility] to confirm last fill date, as it is an independent pharmacy and records may not be up to date. Pharmacy confirmed the patient has no refills remaining. Per DrFirst, fill history is inconsistent.  Spoke with the patient. She stated she called yesterday to request a refill and plans to pick it up today. I explained there were no refills remaining as of June. She was concerned about being in the donut hole, although the last fill had no copay. Briefly explained her new Medicare plan and to contact provider for a medication assistance referral. Patient verbalized understanding.   Patient admits to occasionally forgetting morning doses. She uses a pillbox but does not use a pill reminder. She is open to trying a pill reminder and will place it in a high-traffic area to support her morning routine.  ? Patient education provided on importance of adherence for chronic condition management and STAR quality performance. ? Reviewed barriers to adherence: forgetfulness. ? Plan: Will collaborate with provider to facilitate refill needs.  Dorcas Solian, PharmD Clinical Pharmacist Cell: [phone]

## 2023-08-17 ENCOUNTER — Other Ambulatory Visit: Payer: Self-pay

## 2023-08-17 DIAGNOSIS — E1169 Type 2 diabetes mellitus with other specified complication: Secondary | ICD-10-CM

## 2023-08-17 MED ORDER — EMPAGLIFLOZIN 25 MG PO TABS: 25.0000 mg | ORAL_TABLET | Freq: Every day | ORAL | 2 refills | Status: DC

## 2023-08-21 ENCOUNTER — Encounter (HOSPITAL_COMMUNITY)
Admission: RE | Disposition: A | Discharge status: Home / Self Care | Payer: Self-pay | Source: Home / Self Care | Attending: Internal Medicine

## 2023-08-21 ENCOUNTER — Encounter (HOSPITAL_COMMUNITY): Payer: Self-pay | Admitting: Internal Medicine

## 2023-08-21 ENCOUNTER — Ambulatory Visit (HOSPITAL_COMMUNITY): Admitting: Certified Registered"

## 2023-08-21 ENCOUNTER — Other Ambulatory Visit: Payer: Self-pay

## 2023-08-21 ENCOUNTER — Ambulatory Visit (HOSPITAL_COMMUNITY)
Admission: RE | Admit: 2023-08-21 | Discharge: 2023-08-21 | Disposition: A | Discharge status: Home / Self Care | Attending: Internal Medicine | Admitting: Internal Medicine

## 2023-08-21 ENCOUNTER — Ambulatory Visit (HOSPITAL_BASED_OUTPATIENT_CLINIC_OR_DEPARTMENT_OTHER): Admitting: Certified Registered"

## 2023-08-21 DIAGNOSIS — E119 Type 2 diabetes mellitus without complications: Secondary | ICD-10-CM | POA: Insufficient documentation

## 2023-08-21 DIAGNOSIS — K222 Esophageal obstruction: Secondary | ICD-10-CM | POA: Insufficient documentation

## 2023-08-21 DIAGNOSIS — K317 Polyp of stomach and duodenum: Secondary | ICD-10-CM

## 2023-08-21 DIAGNOSIS — K5730 Diverticulosis of large intestine without perforation or abscess without bleeding: Secondary | ICD-10-CM

## 2023-08-21 DIAGNOSIS — Z860101 Personal history of adenomatous and serrated colon polyps: Secondary | ICD-10-CM

## 2023-08-21 DIAGNOSIS — K219 Gastro-esophageal reflux disease without esophagitis: Secondary | ICD-10-CM | POA: Insufficient documentation

## 2023-08-21 DIAGNOSIS — Z7984 Long term (current) use of oral hypoglycemic drugs: Secondary | ICD-10-CM | POA: Diagnosis not present

## 2023-08-21 DIAGNOSIS — D122 Benign neoplasm of ascending colon: Secondary | ICD-10-CM | POA: Insufficient documentation

## 2023-08-21 DIAGNOSIS — K2950 Unspecified chronic gastritis without bleeding: Secondary | ICD-10-CM | POA: Insufficient documentation

## 2023-08-21 DIAGNOSIS — I209 Angina pectoris, unspecified: Secondary | ICD-10-CM | POA: Diagnosis not present

## 2023-08-21 DIAGNOSIS — D123 Benign neoplasm of transverse colon: Secondary | ICD-10-CM | POA: Insufficient documentation

## 2023-08-21 DIAGNOSIS — D120 Benign neoplasm of cecum: Secondary | ICD-10-CM

## 2023-08-21 DIAGNOSIS — I10 Essential (primary) hypertension: Secondary | ICD-10-CM | POA: Insufficient documentation

## 2023-08-21 DIAGNOSIS — F32A Depression, unspecified: Secondary | ICD-10-CM | POA: Insufficient documentation

## 2023-08-21 DIAGNOSIS — F419 Anxiety disorder, unspecified: Secondary | ICD-10-CM | POA: Insufficient documentation

## 2023-08-21 DIAGNOSIS — K449 Diaphragmatic hernia without obstruction or gangrene: Secondary | ICD-10-CM | POA: Insufficient documentation

## 2023-08-21 DIAGNOSIS — Z1211 Encounter for screening for malignant neoplasm of colon: Secondary | ICD-10-CM | POA: Insufficient documentation

## 2023-08-21 DIAGNOSIS — Z860100 Personal history of colon polyps, unspecified: Secondary | ICD-10-CM | POA: Diagnosis not present

## 2023-08-21 DIAGNOSIS — R1310 Dysphagia, unspecified: Secondary | ICD-10-CM | POA: Diagnosis present

## 2023-08-21 HISTORY — PX: ESOPHAGOGASTRODUODENOSCOPY: SHX5428

## 2023-08-21 HISTORY — PX: ESOPHAGEAL DILATION: SHX303

## 2023-08-21 HISTORY — PX: COLONOSCOPY: SHX5424

## 2023-08-21 LAB — GLUCOSE, CAPILLARY: Glucose-Capillary: 128 mg/dL — ABNORMAL HIGH (ref 70–99)

## 2023-08-21 SURGERY — COLONOSCOPY
Anesthesia: General

## 2023-08-21 MED ORDER — LIDOCAINE 2% (20 MG/ML) 5 ML SYRINGE
INTRAMUSCULAR | Status: DC | PRN
  Administered 2023-08-21: 60 mg via INTRAVENOUS

## 2023-08-21 MED ORDER — ONDANSETRON HCL 4 MG/2ML IJ SOLN
INTRAMUSCULAR | Status: DC | PRN
  Administered 2023-08-21: 4 mg via INTRAVENOUS

## 2023-08-21 MED ORDER — STERILE WATER FOR IRRIGATION IR SOLN
Status: DC | PRN
  Administered 2023-08-21: 60 mL

## 2023-08-21 MED ORDER — PROPOFOL 500 MG/50ML IV EMUL
INTRAVENOUS | Status: DC | PRN
  Administered 2023-08-21: 30 mg via INTRAVENOUS
  Administered 2023-08-21: 70 mg via INTRAVENOUS
  Administered 2023-08-21: 125 ug/kg/min via INTRAVENOUS

## 2023-08-21 MED ORDER — LACTATED RINGERS IV SOLN: INTRAVENOUS | Status: DC

## 2023-08-21 NOTE — Anesthesia Postprocedure Evaluation (Signed)
 Anesthesia Post Note  Patient: Amy Houston  Procedure(s) Performed: COLONOSCOPY EGD (ESOPHAGOGASTRODUODENOSCOPY) DILATION, ESOPHAGUS  Patient location during evaluation: Phase II Anesthesia Type: General Level of consciousness: awake Pain management: pain level controlled Vital Signs Assessment: post-procedure vital signs reviewed and stable Respiratory status: spontaneous breathing and respiratory function stable Cardiovascular status: blood pressure returned to baseline and stable Postop Assessment: no headache and no apparent nausea or vomiting Anesthetic complications: no Comments: Late entry   No notable events documented.   Last Vitals:  Vitals:   08/21/23 0829 08/21/23 1027  BP: (!) 132/54 (!) 174/57  Pulse: (!) 52 65  Resp: 17 15  Temp: 37.1 C 36.5 C  SpO2: 100% 93%    Last Pain:  Vitals:   08/21/23 1027  TempSrc: Oral  PainSc: 0-No pain                 Yvonna JINNY Bosworth

## 2023-08-21 NOTE — Anesthesia Procedure Notes (Addendum)
 Date/Time: 08/21/2023 9:47 AM  Performed by: Para Jerelene CROME, CRNAOxygen Delivery Method: Simple face mask Comments: POM Face Mask.

## 2023-08-21 NOTE — H&P (Signed)
 Primary Care Physician:  [name], MD Primary Gastroenterologist:  Dr. Cindie  Pre-Procedure History & Physical: HPI:  Amy Houston is a 78 y.o. female is here for an EGD with possible dilation due to history of dysphagia, GERD and colonoscopy for personal history of adenomatous colon polyps.  Past Medical History:  Diagnosis Date   Abnormal genetic test 04/09/2013   PALB2 and CHEK2 positivity    Arthritis    of the spine (05/08/2012)   Breast cancer (HCC) 1995   left (05/08/2012)   Breast cancer, right breast, IDC, Multifocal 03/12/2012   Multifocal, 10 and 8 left breast    Depression, major, single episode, severe (HCC) 04/21/2021   Diabetes mellitus 1999   dx; never on meds (05/08/2012)   Diabetes mellitus without complication (HCC) 01/08/2019   Essential hypertension, benign 01/08/2019   Exertional shortness of breath    Family history of thyroid  cancer    GERD (gastroesophageal reflux disease)    tums   H/O hiatal hernia    Hemorrhoid    History of blood transfusion    post child birth   Hyperlipidemia    IBS (irritable bowel syndrome)    Osteopenia 12/11/2012   On Ca++ and Vit D. Bone density in April 2014.  Next bone density due in April 2016.   PONV (postoperative nausea and vomiting)    PPD positive, treated 1999    Past Surgical History:  Procedure Laterality Date   ABDOMINAL HYSTERECTOMY Bilateral 01/30/2013   Procedure: HYSTERECTOMY ABDOMINAL;  Surgeon: Debby JULIANNA Lares, MD;  Location: [facility];  Service: Gynecology;  Laterality: Bilateral;   BREAST BIOPSY Left 1995   BREAST BIOPSY Bilateral 03/2012   one on the left; 2 on the right (05/08/2012)   BREAST LUMPECTOMY Left 1995   CHOLECYSTECTOMY  1992   COLONOSCOPY  12/06/93   Dr. Hayes:single diminutive polyp of the descending colon/extrernal hemorrhoids, benign path   COLONOSCOPY  11/30/98   Dr Hayes:small rectal polyp/internal hemorrhoids, benign path   COLONOSCOPY  11/26/2006   Dr. Hayes:sigmoid  polyp/small interal hemorrhoids, adenomatous   COLONOSCOPY N/A 03/30/2016   Dr. Harvey: normal TI, 4 mm rectal polyp, multiple small and large-mouthed diverticula in rectosigmoid and sigmoid colon, non-bleeding internal hemorrhoids, tubular adenoma on path. surveillance in 5 yeras   COLONOSCOPY WITH ESOPHAGOGASTRODUODENOSCOPY (EGD) N/A 07/01/2012   Dr. Harvey: 2 small adenomas, sigmoid colon diverticula, surveillance in 2019   DILATION AND CURETTAGE OF UTERUS  1968   ESOPHAGEAL DILATION  07/01/2012   Dr. Harvey: proximal esophageal web, Schatzki's ring at [employer] junction, s/p dilaiton. Mild non-erosive gastritis. Repeat EGD July 23, 2012.    ESOPHAGOGASTRODUODENOSCOPY  1994   Dr. Donnald: small hiatal hernia and Schatzki's ring widely patent, CLO test negative   ESOPHAGOGASTRODUODENOSCOPY (EGD) WITH ESOPHAGEAL DILATION N/A 07/23/2012   Dr. Fields:proximal esophageal web v. radiation induced stricture/schatzki ring/small HH, s/p dilation   LEFT HEART CATH AND CORONARY ANGIOGRAPHY N/A 03/16/2020   Procedure: LEFT HEART CATH AND CORONARY ANGIOGRAPHY;  Surgeon: Ladona Heinz, MD;  Location: [facility];  Service: Cardiovascular;  Laterality: N/A;   MASTECTOMY COMPLETE / SIMPLE Left 05/08/2012   MASTECTOMY COMPLETE / SIMPLE W/ SENTINEL NODE BIOPSY Right 05/08/2012   SALPINGOOPHORECTOMY Bilateral 01/30/2013   Procedure: SALPINGO OOPHORECTOMY;  Surgeon: Debby JULIANNA Lares, MD;  Location: [facility];  Service: Gynecology;  Laterality: Bilateral;   SIMPLE MASTECTOMY WITH AXILLARY SENTINEL NODE BIOPSY Right 05/08/2012   Procedure: RIGHT TOTAL MASTECTOMY WITH AXILLARY SENTINEL NODE BIOPSY;  Surgeon: Sherlean JINNY Laughter, MD;  Location: [facility];  Service: General;  Laterality: Right;   SIMPLE MASTECTOMY WITH AXILLARY SENTINEL NODE BIOPSY Left 05/08/2012   Procedure: LEFT TOTAL MASTECTOMY;  Surgeon: Sherlean JINNY Laughter, MD;  Location: [facility];  Service: General;  Laterality: Left;   TONSILLECTOMY AND ADENOIDECTOMY  ~ 2008   TUBAL  LIGATION  1976   UVULOPALATOPHARYNGOPLASTY, TONSILLECTOMY AND SEPTOPLASTY  ~ 2008    Prior to Admission medications   Medication Sig Start Date End Date Taking? Authorizing Provider  aspirin  EC 81 MG tablet Take 81 mg by mouth daily. Swallow whole.   Yes [provider]  Cholecalciferol (VITAMIN D ) 2000 units CAPS Take 6,000 Units by mouth at bedtime.    Yes [provider]  empagliflozin  (JARDIANCE ) 25 MG TABS tablet Take 1 tablet (25 mg total) by mouth daily before breakfast. 08/17/23  Yes [name], MD  lisinopril  (ZESTRIL ) 40 MG tablet Take 1 tablet (40 mg total) by mouth daily. 07/27/23  Yes [name], MD  metoprolol  succinate (TOPROL -XL) 100 MG 24 hr tablet Take 1 tablet (100 mg total) by mouth daily. Please call [phone] to schedule an appointment for future refills. Thank you. 2nd attempt. 01/15/23  Yes [name], MD  Multiple Vitamin (MULTIVITAMIN WITH MINERALS) TABS Take 1 tablet by mouth at bedtime.    Yes [provider]  omeprazole  (PRILOSEC) 20 MG capsule Take 1 capsule (20 mg total) by mouth daily. 30 minutes before breakfast 07/03/23  Yes Shirlean Therisa ORN, NP  ondansetron  (ZOFRAN  ODT) 4 MG disintegrating tablet Take 1 tablet (4 mg total) by mouth every 8 (eight) hours as needed for nausea or vomiting. 02/14/21  Yes Paseda, Folashade R, FNP  rosuvastatin  (CRESTOR ) 10 MG tablet Take 1 tablet (10 mg total) by mouth daily. 02/20/23  Yes [name], MD  venlafaxine  XR (EFFEXOR  XR) 75 MG 24 hr capsule Take 1 capsule (75 mg total) by mouth daily with breakfast. 06/08/23  Yes [name], MD  acetaminophen  (TYLENOL ) 500 MG tablet Take 1,000 mg by mouth daily as needed for moderate pain or headache.     [provider]  albuterol  (VENTOLIN  HFA) 108 (90 Base) MCG/ACT inhaler Inhale 2 puffs into the lungs every 4 (four) hours as needed for wheezing or shortness of breath. 05/07/23   Chandra Harlene LABOR, NP   Chlorphen-PE-Acetaminophen  (NOREL AD) 4-10-325 MG TABS Take 1 tablet by mouth 3 (three) times daily as needed (Nasal congestion). 10/20/21   Tobie Suzzane POUR, MD  clobetasol  ointment (TEMOVATE ) 0.05 % Apply 2 times daily to vulva area for 4 weeks then 2 to 3 times weekly as directed. 07/13/23   Signa Delon LABOR, NP  fluticasone  (FLONASE ) 50 MCG/ACT nasal spray Place 2 sprays into both nostrils daily. 12/10/22   Leath-Warren, Etta JINNY, NP  glucose blood test strip Use as instructed daily dx e11.9 05/06/21   [name], MD  Lancets 30G MISC Once daily testing dx e11.9 05/06/21   [name], MD  levocetirizine (XYZAL ) 5 MG tablet Take 1 tablet (5 mg total) by mouth at bedtime. Patient not taking: Reported on 07/03/2023 12/10/22   Leath-Warren, Etta JINNY, NP  metoprolol  succinate (TOPROL -XL) 100 MG 24 hr tablet Take 1 tablet (100 mg total) by mouth daily. Take with or immediately following a meal. 01/30/23   [name], MD  Misc Natural Products (TART CHERRY ADVANCED PO) Take by mouth. 1 daily Patient not taking: Reported on  07/03/2023    [provider]  nitroGLYCERIN  (NITROSTAT ) 0.4 MG SL tablet Place 0.4 mg under the tongue every 5 (five) minutes as needed for chest pain.    [provider]  Sodium Sulfate-Mag Sulfate-KCl (SUTAB ) [phone] MG TABS As directed 07/03/23   Cindie Carlin POUR, DO  UNABLE TO FIND Med Name: Permaform bra  Dx: 174.9 Bilateral mastectomy 10/24/22   [name], MD    Allergies as of 07/03/2023 - Review Complete 07/03/2023  Allergen Reaction Noted   Moxifloxacin  hcl Rash 12/06/2021   Penicillins Rash 08/18/2010   Colchicine  Other (See Comments) 05/06/2021   Indomethacin  Other (See Comments) 05/06/2021    Family History  Problem Relation Age of Onset   Lung cancer Mother 80       smoker   Depression Father    Leukemia Father 94       Hairy Cell at 54, CLL at 39   Lymphoma Father 34       Hodgkins lymphoma   Lung  cancer Sister 103   Thyroid  cancer Brother 64       Medullary   Lymphoma Brother 60       Follicular lymphoma   Lung cancer Maternal Grandmother        smoker   Cancer Daughter        thyroid    Other Daughter        leiomyoma of esophagus   Thyroid  cancer Daughter    Cancer Daughter        breast   Breast cancer Daughter    Breast cancer Maternal Aunt        mother's paternal half sister   Colon cancer Maternal Aunt        mother's maternal half sister   Lung cancer Maternal Uncle        mother's paternal half brother   Cancer Paternal Uncle        oral cancer dx in his 59s   Lung cancer Cousin        paternal cousin   Breast cancer Cousin        paternal cousin died in her 28s   Heart attack Nephew    Cancer - Colon Neg Hx     Social History   Socioeconomic History   Marital status: Married    Spouse name: Beryl   Number of children: 2   Years of education: 12th grade   Highest education level: Not on file  Occupational History   Occupation: retired    Associate Professor: [employer]  Tobacco Use   Smoking status: Never   Smokeless tobacco: Never  Vaping Use   Vaping status: Never Used  Substance and Sexual Activity   Alcohol use: Not Currently    Comment: rare    Drug use: No   Sexual activity: Yes    Birth control/protection: Surgical    Comment: hyst  Other Topics Concern   Not on file  Social History Narrative   Married 56 yrs.Lives with her husband.Retired.   Social Drivers of Corporate investment banker Strain: Low Risk  (04/03/2023)   Overall Financial Resource Strain (CARDIA)    Difficulty of Paying Living Expenses: Not very hard  Food Insecurity: No Food Insecurity (04/03/2023)   Hunger Vital Sign    Worried About Running Out of Food in the Last Year: Never true    Ran Out of Food in the Last Year: Never true  Transportation Needs: No Transportation Needs (04/03/2023)  PRAPARE - Administrator, Civil Service (Medical): No    Lack of  Transportation (Non-Medical): No  Physical Activity: Inactive (04/03/2023)   Exercise Vital Sign    Days of Exercise per Week: 0 days    Minutes of Exercise per Session: 0 min  Stress: No Stress Concern Present (04/03/2023)   [employer] of Occupational Health - Occupational Stress Questionnaire    Feeling of Stress : Only a little  Social Connections: Socially Integrated (04/03/2023)   Social Connection and Isolation Panel    Frequency of Communication with Friends and Family: More than three times a week    Frequency of Social Gatherings with Friends and Family: More than three times a week    Attends Religious Services: More than 4 times per year    Active Member of [employer] or Organizations: Yes    Attends Banker Meetings: 1 to 4 times per year    Marital Status: Married  Catering manager Violence: Not At Risk (04/03/2023)   Humiliation, Afraid, Rape, and Kick questionnaire    Fear of Current or Ex-Partner: No    Emotionally Abused: No    Physically Abused: No    Sexually Abused: No    Review of Systems: General: Negative for fever, chills, fatigue, weakness. Eyes: Negative for vision changes.  ENT: Negative for hoarseness, difficulty swallowing , nasal congestion. CV: Negative for chest pain, angina, palpitations, dyspnea on exertion, peripheral edema.  Respiratory: Negative for dyspnea at rest, dyspnea on exertion, cough, sputum, wheezing.  GI: See history of present illness. GU:  Negative for dysuria, hematuria, urinary incontinence, urinary frequency, nocturnal urination.  MS: Negative for joint pain, low back pain.  Derm: Negative for rash or itching.  Neuro: Negative for weakness, abnormal sensation, seizure, frequent headaches, memory loss, confusion.  Psych: Negative for anxiety, depression Endo: Negative for unusual weight change.  Heme: Negative for bruising or bleeding. Allergy: Negative for rash or hives.  Physical Exam: Vital signs in last 24  hours: Temp:  [98.7 F (37.1 C)] 98.7 F (37.1 C) (08/19 0829) Pulse Rate:  [52] 52 (08/19 0829) Resp:  [17] 17 (08/19 0829) BP: (132)/(54) 132/54 (08/19 0829) SpO2:  [100 %] 100 % (08/19 0829) Weight:  [70.3 kg] 70.3 kg (08/19 0829)   General:   Alert,  Well-developed, well-nourished, pleasant and cooperative in NAD Head:  Normocephalic and atraumatic. Eyes:  Sclera clear, no icterus.   Conjunctiva pink. Ears:  Normal auditory acuity. Nose:  No deformity, discharge,  or lesions. Msk:  Symmetrical without gross deformities. Normal posture. Extremities:  Without clubbing or edema. Neurologic:  Alert and  oriented x4;  grossly normal neurologically. Skin:  Intact without significant lesions or rashes. Psych:  Alert and cooperative. Normal mood and affect.   Impression/Plan: Amy Houston is here for an EGD with possible dilation due to history of dysphagia, GERD and colonoscopy for personal history of adenomatous colon polyps.  Risks, benefits, limitations, imponderables and alternatives regarding procedure have been reviewed with the patient. Questions have been answered. All parties agreeable.

## 2023-08-21 NOTE — Transfer of Care (Addendum)
 Immediate Anesthesia Transfer of Care Note  Patient: Amy Houston  Procedure(s) Performed: COLONOSCOPY EGD (ESOPHAGOGASTRODUODENOSCOPY) DILATION, ESOPHAGUS  Patient Location: Endoscopy Unit  Anesthesia Type:General  Level of Consciousness: drowsy and patient cooperative  Airway & Oxygen Therapy: Patient Spontanous Breathing  Post-op Assessment: Report given to RN and Post -op Vital signs reviewed and stable  Post vital signs: Reviewed and stable  Last Vitals:  Vitals Value Taken Time  BP 174/57 08/21/23   1027  Temp 36.5 08/21/23   1027  Pulse 65 08/21/23   1027  Resp 15 08/21/23   1027  SpO2 93% 08/21/23   1027    Last Pain:  Vitals:   08/21/23 0942  TempSrc:   PainSc: 0-No pain      Patients Stated Pain Goal: 7 (08/21/23 0829)  Complications: No notable events documented.

## 2023-08-21 NOTE — Anesthesia Preprocedure Evaluation (Signed)
 Anesthesia Evaluation  Patient identified by MRN, date of birth, ID band Patient awake    Reviewed: Allergy & Precautions, H&P , NPO status , Patient's Chart, lab work & pertinent test results, reviewed documented beta blocker date and time   History of Anesthesia Complications (+) PONV and history of anesthetic complications  Airway Mallampati: II  TM Distance: >3 FB Neck ROM: full    Dental no notable dental hx.    Pulmonary neg pulmonary ROS   Pulmonary exam normal breath sounds clear to auscultation       Cardiovascular Exercise Tolerance: Good hypertension, + angina  negative cardio ROS  Rhythm:regular Rate:Normal     Neuro/Psych  PSYCHIATRIC DISORDERS Anxiety Depression    negative neurological ROS  negative psych ROS   GI/Hepatic negative GI ROS, Neg liver ROS, hiatal hernia,GERD  ,,  Endo/Other  negative endocrine ROSdiabetes    Renal/GU negative Renal ROS  negative genitourinary   Musculoskeletal   Abdominal   Peds  Hematology negative hematology ROS (+)   Anesthesia Other Findings   Reproductive/Obstetrics negative OB ROS                              Anesthesia Physical Anesthesia Plan  ASA: 2  Anesthesia Plan: General   Post-op Pain Management:    Induction:   PONV Risk Score and Plan: Propofol  infusion  Airway Management Planned:   Additional Equipment:   Intra-op Plan:   Post-operative Plan:   Informed Consent: I have reviewed the patients History and Physical, chart, labs and discussed the procedure including the risks, benefits and alternatives for the proposed anesthesia with the patient or authorized representative who has indicated his/her understanding and acceptance.     Dental Advisory Given  Plan Discussed with: CRNA  Anesthesia Plan Comments:         Anesthesia Quick Evaluation

## 2023-08-21 NOTE — Op Note (Signed)
 [facility] Patient Name: Amy Houston Procedure Date: 08/21/2023 9:34 AM MRN: [id number] Date of Birth: Dec 26, 1945 Attending MD: Carlin POUR. Cindie , [place], [id number] CSN: [id number] Age: 78 Admit Type: Outpatient Procedure:                Upper GI endoscopy Indications:              Dysphagia, Heartburn Providers:                Carlin POUR. Cindie, DO, Ashley Goins, Bascom Blush Referring MD:              Medicines:                See the Anesthesia note for documentation of the                            administered medications Complications:            No immediate complications. Estimated Blood Loss:     Estimated blood loss was minimal. Procedure:                Pre-Anesthesia Assessment:                           - The anesthesia plan was to use monitored                            anesthesia care (MAC).                           After obtaining informed consent, the endoscope was                            passed under direct vision. Throughout the                            procedure, the patient's blood pressure, pulse, and                            oxygen saturations were monitored continuously. The                            [id number] ([id number]) scope was introduced through                            the mouth, and advanced to the second part of                            duodenum. The upper GI endoscopy was accomplished                            without difficulty. The patient tolerated the                            procedure well. Scope In: 9:52:33 AM Scope Out: 10:02:19 AM Total Procedure Duration: 0 hours 9 minutes 46 seconds  Findings:      A small hiatal hernia was present.  A mild Schatzki ring was found in the distal esophagus. A TTS dilator       was passed through the scope. Dilation with an 18-19-20 mm balloon       dilator was performed to 20 mm. The dilation site was examined and       showed moderate improvement in luminal narrowing.       Multiple 4 to 12 mm likely fundic gland polyps with no bleeding and no       stigmata of recent bleeding were found in the gastric fundus and in the       gastric body. All polyps >10 mm were removed (four) with a cold snare.       Resection and retrieval were complete.      The duodenal bulb, first portion of the duodenum and second portion of       the duodenum were normal. Impression:               - Small hiatal hernia.                           - Mild Schatzki ring. Dilated.                           - Multiple gastric polyps. Resected and retrieved.                           - Normal duodenal bulb, first portion of the                            duodenum and second portion of the duodenum. Moderate Sedation:      Per Anesthesia Care Recommendation:           - Patient has a contact number available for                            emergencies. The signs and symptoms of potential                            delayed complications were discussed with the                            patient. Return to normal activities tomorrow.                            Written discharge instructions were provided to the                            patient.                           - Resume previous diet.                           - Continue present medications.                           - Await pathology results.                           -  Repeat upper endoscopy PRN for retreatment.                           - Return to GI clinic in 1 year. Procedure Code(s):        --- Professional ---                           [phone], Esophagogastroduodenoscopy, flexible,                            transoral; with removal of tumor(s), polyp(s), or                            other lesion(s) by snare technique                           [id number], Esophagogastroduodenoscopy, flexible,                            transoral; with transendoscopic balloon dilation of                            esophagus (less than 30 mm  diameter) Diagnosis Code(s):        --- Professional ---                           K44.9, Diaphragmatic hernia without obstruction or                            gangrene                           K22.2, Esophageal obstruction                           K31.7, Polyp of stomach and duodenum                           R13.10, Dysphagia, unspecified                           R12, Heartburn CPT copyright 2022 American Medical Association. All rights reserved. The codes documented in this report are preliminary and upon coder review may  be revised to meet current compliance requirements. Carlin POUR. Cindie, DO Carlin POUR. Cindie, DO 08/21/2023 10:05:54 AM This report has been signed electronically. Number of Addenda: 0

## 2023-08-21 NOTE — Op Note (Signed)
 [facility] Patient Name: Amy Houston Procedure Date: 08/21/2023 9:32 AM MRN: [id number] Date of Birth: January 29, 1945 Attending MD: Carlin POUR. Cindie , [place], [id number] CSN: [id number] Age: 78 Admit Type: Outpatient Procedure:                Colonoscopy Indications:              Surveillance: Personal history of colonic polyps                            (unknown histology) on last colonoscopy more than 5                            years ago Providers:                Carlin POUR. Cindie, DO, Ashley Goins, Bascom Blush Referring MD:              Medicines:                See the Anesthesia note for documentation of the                            administered medications Complications:            No immediate complications. Estimated Blood Loss:     Estimated blood loss was minimal. Procedure:                Pre-Anesthesia Assessment:                           - The anesthesia plan was to use monitored                            anesthesia care (MAC).                           After obtaining informed consent, the colonoscope                            was passed under direct vision. Throughout the                            procedure, the patient's blood pressure, pulse, and                            oxygen saturations were monitored continuously. The                            [id number]-HQ190L ([id number]) Peds Colon was introduced                            through the anus and advanced to the the cecum,                            identified by appendiceal orifice and ileocecal                            valve. The colonoscopy  was performed without                            difficulty. The patient tolerated the procedure                            well. The quality of the bowel preparation was                            evaluated using the BBPS [facility] Bowel Preparation                            Scale) with scores of: Right Colon = 2 (minor                            amount of residual  staining, small fragments of                            stool and/or opaque liquid, but mucosa seen well),                            Transverse Colon = 2 (minor amount of residual                            staining, small fragments of stool and/or opaque                            liquid, but mucosa seen well) and Left Colon = 2                            (minor amount of residual staining, small fragments                            of stool and/or opaque liquid, but mucosa seen                            well). The total BBPS score equals 6. The quality                            of the bowel preparation was good. Scope In: 10:07:35 AM Scope Out: 10:21:12 AM Scope Withdrawal Time: 0 hours 10 minutes 49 seconds  Total Procedure Duration: 0 hours 13 minutes 37 seconds  Findings:      Multiple small-mouthed diverticula were found in the sigmoid colon.      Two sessile polyps were found in the ascending colon and cecum. The       polyps were 4 to 6 mm in size. These polyps were removed with a cold       snare. Resection and retrieval were complete.      A 6 mm polyp was found in the transverse colon. The polyp was sessile.       The polyp was removed with a cold snare. Resection and retrieval were       complete.      The exam was  otherwise without abnormality. Impression:               - Diverticulosis in the sigmoid colon.                           - Two 4 to 6 mm polyps in the ascending colon and                            in the cecum, removed with a cold snare. Resected                            and retrieved.                           - One 6 mm polyp in the transverse colon, removed                            with a cold snare. Resected and retrieved.                           - The examination was otherwise normal. Moderate Sedation:      Per Anesthesia Care Recommendation:           - Patient has a contact number available for                            emergencies. The signs  and symptoms of potential                            delayed complications were discussed with the                            patient. Return to normal activities tomorrow.                            Written discharge instructions were provided to the                            patient.                           - Resume previous diet.                           - Continue present medications.                           - Await pathology results.                           - No repeat colonoscopy due to age.                           - Return to GI clinic in 1 year. Procedure Code(s):        --- Professional ---  54614, Colonoscopy, flexible; with removal of                            tumor(s), polyp(s), or other lesion(s) by snare                            technique Diagnosis Code(s):        --- Professional ---                           Z86.010, Personal history of colonic polyps                           D12.2, Benign neoplasm of ascending colon                           D12.0, Benign neoplasm of cecum                           D12.3, Benign neoplasm of transverse colon (hepatic                            flexure or splenic flexure)                           K57.30, Diverticulosis of large intestine without                            perforation or abscess without bleeding CPT copyright 2022 American Medical Association. All rights reserved. The codes documented in this report are preliminary and upon coder review may  be revised to meet current compliance requirements. Carlin POUR. Cindie, DO Carlin POUR. Erek Kowal, DO 08/21/2023 10:30:11 AM This report has been signed electronically. Number of Addenda: 0

## 2023-08-21 NOTE — Discharge Instructions (Addendum)
 EGD Discharge instructions Please read the instructions outlined below and refer to this sheet in the next few weeks. These discharge instructions provide you with general information on caring for yourself after you leave the hospital. Your doctor may also give you specific instructions. While your treatment has been planned according to the most current medical practices available, unavoidable complications occasionally occur. If you have any problems or questions after discharge, please call your doctor. ACTIVITY You may resume your regular activity but move at a slower pace for the next 24 hours.  Take frequent rest periods for the next 24 hours.  Walking will help expel (get rid of) the air and reduce the bloated feeling in your abdomen.  No driving for 24 hours (because of the anesthesia (medicine) used during the test).  You may shower.  Do not sign any important legal documents or operate any machinery for 24 hours (because of the anesthesia used during the test).  NUTRITION Drink plenty of fluids.  You may resume your normal diet.  Begin with a light meal and progress to your normal diet.  Avoid alcoholic beverages for 24 hours or as instructed by your caregiver.  MEDICATIONS You may resume your normal medications unless your caregiver tells you otherwise.  WHAT YOU CAN EXPECT TODAY You may experience abdominal discomfort such as a feeling of fullness or "gas" pains.  FOLLOW-UP Your doctor will discuss the results of your test with you.  SEEK IMMEDIATE MEDICAL ATTENTION IF ANY OF THE FOLLOWING OCCUR: Excessive nausea (feeling sick to your stomach) and/or vomiting.  Severe abdominal pain and distention (swelling).  Trouble swallowing.  Temperature over 101 F (37.8 C).  Rectal bleeding or vomiting of blood.     Colonoscopy Discharge Instructions  Read the instructions outlined below and refer to this sheet in the next few weeks. These discharge instructions provide you with  general information on caring for yourself after you leave the hospital. Your doctor may also give you specific instructions. While your treatment has been planned according to the most current medical practices available, unavoidable complications occasionally occur.   ACTIVITY You may resume your regular activity, but move at a slower pace for the next 24 hours.  Take frequent rest periods for the next 24 hours.  Walking will help get rid of the air and reduce the bloated feeling in your belly (abdomen).  No driving for 24 hours (because of the medicine (anesthesia) used during the test).   Do not sign any important legal documents or operate any machinery for 24 hours (because of the anesthesia used during the test).  NUTRITION Drink plenty of fluids.  You may resume your normal diet as instructed by your doctor.  Begin with a light meal and progress to your normal diet. Heavy or fried foods are harder to digest and may make you feel sick to your stomach (nauseated).  Avoid alcoholic beverages for 24 hours or as instructed.  MEDICATIONS You may resume your normal medications unless your doctor tells you otherwise.  WHAT YOU CAN EXPECT TODAY Some feelings of bloating in the abdomen.  Passage of more gas than usual.  Spotting of blood in your stool or on the toilet paper.  IF YOU HAD POLYPS REMOVED DURING THE COLONOSCOPY: No aspirin  products for 7 days or as instructed.  No alcohol for 7 days or as instructed.  Eat a soft diet for the next 24 hours.  FINDING OUT THE RESULTS OF YOUR TEST Not all test results are  available during your visit. If your test results are not back during the visit, make an appointment with your caregiver to find out the results. Do not assume everything is normal if you have not heard from your caregiver or the medical facility. It is important for you to follow up on all of your test results.  SEEK IMMEDIATE MEDICAL ATTENTION IF: You have more than a spotting of  blood in your stool.  Your belly is swollen (abdominal distention).  You are nauseated or vomiting.  You have a temperature over 101.  You have abdominal pain or discomfort that is severe or gets worse throughout the day.   Your upper endoscopy revealed a small hiatal hernia.  You had a tightening of your esophagus called a Schatzki's ring.  I stretched this out today.  You had numerous polyps in your stomach, these are likely benign fundic gland polyps and nothing to worry about.  I did remove 4 of the larger ones today.  We will call with these results.  Small bowel was normal.  Continue on omeprazole .  Your colonoscopy revealed 3 small polyp(s) which I removed successfully. Await pathology results, my office will contact you.  Given your age, I do not think you need further colonoscopies for surveillance purposes.  You also have diverticulosis. I would recommend increasing fiber in your diet or adding OTC Benefiber/Metamucil. Be sure to drink at least 4 to 6 glasses of water  daily. Follow-up with GI in 1 year. Message sent to the office and will call you to schedule appointment.    I hope you have a great rest of your week!  Carlin POUR. Cindie, D.O. Gastroenterology and Hepatology [facility]

## 2023-08-22 ENCOUNTER — Inpatient Hospital Stay: Attending: Oncology

## 2023-08-22 ENCOUNTER — Ambulatory Visit (HOSPITAL_COMMUNITY)
Admission: RE | Admit: 2023-08-22 | Discharge: 2023-08-22 | Disposition: A | Discharge status: Home / Self Care | Source: Ambulatory Visit | Attending: Hematology | Admitting: Hematology

## 2023-08-22 ENCOUNTER — Ambulatory Visit: Payer: PPO | Admitting: Oncology

## 2023-08-22 DIAGNOSIS — Z853 Personal history of malignant neoplasm of breast: Secondary | ICD-10-CM | POA: Diagnosis not present

## 2023-08-22 DIAGNOSIS — M8580 Other specified disorders of bone density and structure, unspecified site: Secondary | ICD-10-CM | POA: Diagnosis not present

## 2023-08-22 DIAGNOSIS — Z170 Estrogen receptor positive status [ER+]: Secondary | ICD-10-CM | POA: Insufficient documentation

## 2023-08-22 DIAGNOSIS — Z9013 Acquired absence of bilateral breasts and nipples: Secondary | ICD-10-CM | POA: Diagnosis not present

## 2023-08-22 DIAGNOSIS — Z9223 Personal history of estrogen therapy: Secondary | ICD-10-CM | POA: Insufficient documentation

## 2023-08-22 DIAGNOSIS — C50911 Malignant neoplasm of unspecified site of right female breast: Secondary | ICD-10-CM | POA: Diagnosis not present

## 2023-08-22 DIAGNOSIS — C50111 Malignant neoplasm of central portion of right female breast: Secondary | ICD-10-CM | POA: Diagnosis not present

## 2023-08-22 LAB — CBC WITH DIFFERENTIAL/PLATELET
Abs Immature Granulocytes: 0.02 K/uL (ref 0.00–0.07)
Basophils Absolute: 0 K/uL (ref 0.0–0.1)
Basophils Relative: 0 %
Eosinophils Absolute: 0.1 K/uL (ref 0.0–0.5)
Eosinophils Relative: 1 %
HCT: 39.2 % (ref 36.0–46.0)
Hemoglobin: 12.8 g/dL (ref 12.0–15.0)
Immature Granulocytes: 0 %
Lymphocytes Relative: 34 %
Lymphs Abs: 2.5 K/uL (ref 0.7–4.0)
MCH: 32.6 pg (ref 26.0–34.0)
MCHC: 32.7 g/dL (ref 30.0–36.0)
MCV: 99.7 fL (ref 80.0–100.0)
Monocytes Absolute: 0.6 K/uL (ref 0.1–1.0)
Monocytes Relative: 9 %
Neutro Abs: 4.1 K/uL (ref 1.7–7.7)
Neutrophils Relative %: 56 %
Platelets: 294 K/uL (ref 150–400)
RBC: 3.93 MIL/uL (ref 3.87–5.11)
RDW: 13 % (ref 11.5–15.5)
WBC: 7.4 K/uL (ref 4.0–10.5)
nRBC: 0 % (ref 0.0–0.2)

## 2023-08-22 LAB — COMPREHENSIVE METABOLIC PANEL WITH GFR
ALT: 20 U/L (ref 0–44)
AST: 19 U/L (ref 15–41)
Albumin: 3.7 g/dL (ref 3.5–5.0)
Alkaline Phosphatase: 56 U/L (ref 38–126)
Anion gap: 10 (ref 5–15)
BUN: 13 mg/dL (ref 8–23)
CO2: 24 mmol/L (ref 22–32)
Calcium: 9.2 mg/dL (ref 8.9–10.3)
Chloride: 106 mmol/L (ref 98–111)
Creatinine, Ser: 0.93 mg/dL (ref 0.44–1.00)
GFR, Estimated: >60 mL/min (ref >60)
Glucose, Bld: 148 mg/dL — ABNORMAL HIGH (ref 70–99)
Potassium: 3.8 mmol/L (ref 3.5–5.1)
Sodium: 140 mmol/L (ref 135–145)
Total Bilirubin: 0.9 mg/dL (ref 0.0–1.2)
Total Protein: 6.9 g/dL (ref 6.5–8.1)

## 2023-08-22 LAB — VITAMIN D 25 HYDROXY (VIT D DEFICIENCY, FRACTURES): Vit D, 25-Hydroxy: 55.51 ng/mL (ref 30–100)

## 2023-08-22 LAB — SURGICAL PATHOLOGY

## 2023-08-22 MED ORDER — GADOBUTROL 1 MMOL/ML IV SOLN
7.0000 mL | Freq: Once | INTRAVENOUS | Status: AC | PRN
  Administered 2023-08-22: 7 mL via INTRAVENOUS

## 2023-08-23 ENCOUNTER — Encounter (HOSPITAL_COMMUNITY): Payer: Self-pay | Admitting: Internal Medicine

## 2023-08-29 ENCOUNTER — Inpatient Hospital Stay (HOSPITAL_BASED_OUTPATIENT_CLINIC_OR_DEPARTMENT_OTHER): Admitting: Oncology

## 2023-08-29 VITALS — BP 145/57 | HR 63 | Temp 98.0°F | Resp 16 | Wt 157.6 lb

## 2023-08-29 DIAGNOSIS — M810 Age-related osteoporosis without current pathological fracture: Secondary | ICD-10-CM | POA: Insufficient documentation

## 2023-08-29 DIAGNOSIS — Z853 Personal history of malignant neoplasm of breast: Secondary | ICD-10-CM | POA: Diagnosis not present

## 2023-08-29 DIAGNOSIS — Z170 Estrogen receptor positive status [ER+]: Secondary | ICD-10-CM

## 2023-08-29 DIAGNOSIS — C50111 Malignant neoplasm of central portion of right female breast: Secondary | ICD-10-CM | POA: Diagnosis not present

## 2023-08-29 DIAGNOSIS — M8580 Other specified disorders of bone density and structure, unspecified site: Secondary | ICD-10-CM | POA: Diagnosis not present

## 2023-08-29 NOTE — Assessment & Plan Note (Deleted)
-   Received Prolia  until 01/15/2020 and decided not to continue it for unrelated jaw pain. - DEXA scan on 08/09/2020 with T-score -2.1. - Vitamin D  level is 51.  Continue calcium  and vitamin D  supplements.

## 2023-08-29 NOTE — Assessment & Plan Note (Addendum)
-   Bilateral mastectomy sites are within normal limits.  No palpable adenopathy. - Femara  was discontinued in May 2024. - Labs from 08/22/23: Normal LFTs.  CBC grossly normal. - Breast MRI from 08/22/23: BI-RADS Category 1. - RTC 1 year for follow-up with repeat breast MRI.

## 2023-08-29 NOTE — Assessment & Plan Note (Addendum)
-   Received Prolia  until 01/15/2020 and decided not to continue it for unrelated jaw pain. - DEXA scan on 08/09/2020 with T-score -2.1. -Recommend repeat bone density.  We will get this scheduled in the next few weeks. - Vitamin D  level is 55.51.  Continue calcium  and vitamin D  supplements. -She is no longer on an aromatase inhibitor.

## 2023-08-29 NOTE — Progress Notes (Signed)
 [facility] OFFICE PROGRESS NOTE  Amy Rollene BRAVO, MD  ASSESSMENT & PLAN:    Assessment & Plan Malignant neoplasm of central portion of right breast in female, estrogen receptor positive (HCC) - Bilateral mastectomy sites are within normal limits.  No palpable adenopathy. - Femara  was discontinued in May 2024. - Labs from 08/22/23: Normal LFTs.  CBC grossly normal. - Breast MRI from 08/22/23: BI-RADS Category 1. - RTC 1 year for follow-up with repeat breast MRI.   Osteopenia, unspecified location - Received Prolia  until 01/15/2020 and decided not to continue it for unrelated jaw pain. - DEXA scan on 08/09/2020 with T-score -2.1. -Recommend repeat bone density.  We will get this scheduled in the next few weeks. - Vitamin D  level is 55.51.  Continue calcium  and vitamin D  supplements. -She is no longer on an aromatase inhibitor.  Orders Placed This Encounter  Procedures   DG Bone Density    Standing Status:   Future    Expected Date:   09/05/2023    Expiration Date:   08/28/2024    Reason for Exam (SYMPTOM  OR DIAGNOSIS REQUIRED):   osteopenia    Preferred imaging location?:   [facility]    Release to patient:   Immediate   MR BREAST BILATERAL W WO CONTRAST INC CAD    Standing Status:   Future    Expected Date:   08/26/2024    Expiration Date:   08/28/2024    If indicated for the ordered procedure, I authorize the administration of contrast media per Radiology protocol:   Yes    What is the patient's sedation requirement?:   No Sedation    Does the patient have a pacemaker or implanted devices?:   No    Preferred imaging location?:   [facility] (table limit - 500lbs)   VITAMIN D  25 Hydroxy (Vit-D Deficiency, Fractures)    Standing Status:   Future    Expected Date:   02/29/2024    Expiration Date:   08/28/2024   Comprehensive metabolic panel    Standing Status:   Future    Expected Date:   02/29/2024    Expiration Date:   08/28/2024   CBC with  Differential    Standing Status:   Future    Expected Date:   02/29/2024    Expiration Date:   08/28/2024    INTERVAL HISTORY: Patient returns for follow-up for history of right breast cancer.  She discontinued Femara  last year.  She completed 10 years of antihormone treatment.  She was started on Prolia  for osteoporosis/osteopenia but chose to stop secondary to unrelated jaw pain.  Reports she does take calcium  and vitamin D .  Patient receives annual breast MRIs for high risk of recurrence.  Most recent MRI is from 08/22/2023 which did not reveal evidence of malignancy in either right or left mastectomy site.  Patient had a colonoscopy on 08/21/2023 with Dr. Cindie for follow-up for adenomatous colon polyps and EGD for dysphagia and GERD.  Results of EGD showed small hiatal hernia, mild Schatzki ring which was dilated and multiple gastric polyps.  Colonoscopy showed diverticulosis, 2 polyps in ascending colon and cecum and 1 polyp in transverse colon.  Pathology for EGD was negative for H. pylori, intestinal metaplasia, dysplasia and carcinoma.  Pathology from colonoscopy revealed tubular adenoma.  She was evaluated at urgent care for URI/viral bronchitis on 09/14/2022, 12/10/2022 and 05/07/2023.  She was given antibiotics and steroids.  She was evaluated  on 02/27/2023 for a fall and minor head injury.  Imaging was essentially unremarkable.  We reviewed vitamin D , CMP, CBC.  SUMMARY OF HEMATOLOGIC HISTORY: Oncology History  Breast cancer, right breast, IDC, Multifocal  03/19/2012 Initial Diagnosis   Breast cancer, right breast, IDC, Multifocal   05/08/2012 Surgery   Bilateral simple mastectomies by Dr. Merrilyn   06/03/2012 -  Chemotherapy   Aromatase inhibitor.  PALB2 and CHEK2+ and therefore would benefit from lifelong AI therapy.   01/30/2013 Surgery   Laparotomy, minimal lysis of adhesions, abdominal hysterectomy, bilateral salpingo-oophorectomy.      CBC    Component Value Date/Time   WBC  7.4 08/22/2023 0925   RBC 3.93 08/22/2023 0925   HGB 12.8 08/22/2023 0925   HGB 13.3 06/07/2023 1057   HCT 39.2 08/22/2023 0925   HCT 41.6 06/07/2023 1057   PLT 294 08/22/2023 0925   PLT 295 06/07/2023 1057   MCV 99.7 08/22/2023 0925   MCV 96 06/07/2023 1057   MCH 32.6 08/22/2023 0925   MCHC 32.7 08/22/2023 0925   RDW 13.0 08/22/2023 0925   RDW 12.9 06/07/2023 1057   LYMPHSABS 2.5 08/22/2023 0925   MONOABS 0.6 08/22/2023 0925   EOSABS 0.1 08/22/2023 0925   BASOSABS 0.0 08/22/2023 0925       Latest Ref Rng & Units 08/22/2023    9:25 AM 06/07/2023   10:57 AM 01/23/2023   10:28 AM  CMP  Glucose 70 - 99 mg/dL 851  872  856   BUN 8 - 23 mg/dL 13  21  15    Creatinine 0.44 - 1.00 mg/dL 9.06  8.89  9.07   Sodium 135 - 145 mmol/L 140  140  143   Potassium 3.5 - 5.1 mmol/L 3.8  4.3  4.1   Chloride 98 - 111 mmol/L 106  104  108   CO2 22 - 32 mmol/L 24  20  19    Calcium  8.9 - 10.3 mg/dL 9.2  9.6  9.4   Total Protein 6.5 - 8.1 g/dL 6.9  6.7    Total Bilirubin 0.0 - 1.2 mg/dL 0.9  0.2    Alkaline Phos 38 - 126 U/L 56  80    AST 15 - 41 U/L 19  13    ALT 0 - 44 U/L 20  12       No results found for: FERRITIN, VITAMINB12  Vitals:   08/29/23 1048  BP: (!) 145/57  Pulse: 63  Resp: 16  Temp: 98 F (36.7 C)  SpO2: 99%    Review of System:  Review of Systems  Neurological:  Positive for tingling and sensory change.    Physical Exam: Physical Exam Constitutional:      Appearance: Normal appearance.  HENT:     Head: Normocephalic and atraumatic.  Eyes:     Pupils: Pupils are equal, round, and reactive to light.  Cardiovascular:     Rate and Rhythm: Normal rate and regular rhythm.     Heart sounds: Normal heart sounds. No murmur heard. Pulmonary:     Effort: Pulmonary effort is normal.     Breath sounds: Normal breath sounds. No wheezing.  Chest:  Breasts:    Right: Absent.     Left: Absent.  Abdominal:     General: Bowel sounds are normal. There is no  distension.     Palpations: Abdomen is soft.     Tenderness: There is no abdominal tenderness.  Musculoskeletal:  General: Normal range of motion.     Cervical back: Normal range of motion.  Skin:    General: Skin is warm and dry.     Findings: No rash.  Neurological:     Mental Status: She is alert and oriented to person, place, and time.     Gait: Gait is intact.  Psychiatric:        Mood and Affect: Mood and affect normal.        Cognition and Memory: Memory normal.        Judgment: Judgment normal.      I spent 20 minutes dedicated to the care of this patient (face-to-face and non-face-to-face) on the date of the encounter to include what is described in the assessment and plan.,  Delon Hope, NP 08/29/2023 3:52 PM

## 2023-09-04 ENCOUNTER — Ambulatory Visit: Payer: Self-pay | Admitting: Internal Medicine

## 2023-09-04 ENCOUNTER — Telehealth: Payer: Self-pay

## 2023-09-04 NOTE — Progress Notes (Signed)
   09/04/2023  Patient ID: Amy Houston, female   DOB: 04-Feb-1945, 78 y.o.   MRN: [id number]  This patient is appearing on a report for being at risk of failing the adherence measure for identified medications this calendar year.   Medication Adherence Summary (STAR/HEDIS Monitoring): Adherence Category: diabetes    Drug Name: Jardiance  25 mg  Sold Date: 08/21/2023  Days' Supply: 30  These medications were not on the adherence report: Drug Name: Rosuvastatin  10 mg  Sold Date: 07/13/2023 Days' Supply:90  Drug Name: Lisinopril  40 mg  Sold Date: 07/28/2023 Days' Supply:90    Notes: ? Adherence data pulled from pharmacy claims portal Dr. Annemarie.    ? Reviewed barriers to adherence: forgetfulness. ? Plan: Will follow up to get Jardiance  switched to 90 days supply to help with compliance.  Dorcas Solian, PharmD Clinical Pharmacist Cell: [phone]

## 2023-09-05 ENCOUNTER — Ambulatory Visit: Admitting: Family Medicine

## 2023-09-05 ENCOUNTER — Encounter: Payer: Self-pay | Admitting: Family Medicine

## 2023-09-05 VITALS — BP 120/59 | HR 83 | Resp 16 | Ht <= 58 in | Wt 161.1 lb

## 2023-09-05 DIAGNOSIS — E785 Hyperlipidemia, unspecified: Secondary | ICD-10-CM | POA: Diagnosis not present

## 2023-09-05 DIAGNOSIS — E1169 Type 2 diabetes mellitus with other specified complication: Secondary | ICD-10-CM

## 2023-09-05 DIAGNOSIS — Z23 Encounter for immunization: Secondary | ICD-10-CM | POA: Diagnosis not present

## 2023-09-05 DIAGNOSIS — I10 Essential (primary) hypertension: Secondary | ICD-10-CM | POA: Diagnosis not present

## 2023-09-05 DIAGNOSIS — F419 Anxiety disorder, unspecified: Secondary | ICD-10-CM | POA: Diagnosis not present

## 2023-09-05 MED ORDER — VENLAFAXINE HCL 75 MG PO TABS: 75.0000 mg | ORAL_TABLET | Freq: Two times a day (BID) | ORAL | 5 refills | Status: AC

## 2023-09-05 NOTE — Patient Instructions (Addendum)
 Keep annual exam in December as befoore  HBA1C tomorrow please  Flu vaccine today   Increase dose of effexor  t twice daily  Pls send for eye exam with dr Darroll canny June 2025  It is important that you exercise regularly at least 30 minutes 5 times a week. If you develop chest pain, have severe difficulty breathing, or feel very tired, stop exercising immediately and seek medical attention   Thanks for choosing [facility], we consider it a privelige to serve you.

## 2023-09-11 ENCOUNTER — Encounter: Payer: Self-pay | Admitting: Family Medicine

## 2023-09-11 ENCOUNTER — Telehealth: Payer: Self-pay

## 2023-09-11 DIAGNOSIS — Z23 Encounter for immunization: Secondary | ICD-10-CM | POA: Insufficient documentation

## 2023-09-11 NOTE — Assessment & Plan Note (Signed)
 Controlled, no change in medication DASH diet and commitment to daily physical activity for a minimum of 30 minutes discussed and encouraged, as a part of hypertension management. The importance of attaining a healthy weight is also discussed.     09/05/2023    3:46 PM 08/29/2023   10:48 AM 08/21/2023   10:27 AM 08/21/2023    8:29 AM 07/03/2023   10:11 AM 06/08/2023    4:22 PM 05/07/2023    3:14 PM  BP/Weight  Systolic BP 120 145 174 132 134 119 144  Diastolic BP 59 57 57 54 47 69 73  Wt. (Lbs) 161.12 157.63  155 159.8 156.12   BMI 33.67 kg/m2 32.94 kg/m2  32.4 kg/m2 33.4 kg/m2 32.63 kg/m2

## 2023-09-11 NOTE — Progress Notes (Addendum)
   09/11/2023  Patient ID: Amy Houston, female   DOB: 30-Mar-1945, 78 y.o.   MRN: [id number]  This patient is appearing on a report for being at risk of failing the adherence measure for identified medications this calendar year.   Medication Adherence Summary (STAR/HEDIS Monitoring): Adherence Category: diabetes    Drug Name: Jardiance  25 mg  Sold Date: 08/21/2023  Days' Supply: 30   Drug Name: Rosuvastatin  10 mg  Sold Date: 07/13/2023 Days' Supply:90     Notes: ? Adherence data pulled from pharmacy claims portal Dr. Annemarie.  ? Reviewed barriers to adherence: days supply. ? Plan: Awaiting for days supply conversion of Jardiance  - if cost effective.  Dorcas Solian, PharmD Clinical Pharmacist Cell: [phone]

## 2023-09-11 NOTE — Assessment & Plan Note (Signed)
 After obtaining informed consent, the influenza vaccine is  administered , with no adverse effect noted at the time of administration.

## 2023-09-11 NOTE — Assessment & Plan Note (Addendum)
 Diabetes associated with hypertension, hyperlipidemia, arthritis, and depression  Amy [facility] is reminded of the importance of commitment to daily physical activity for 30 minutes or more, as able and the need to limit carbohydrate intake to 30 to 60 grams per meal to help with blood sugar control.   The need to take medication as prescribed, test blood sugar as directed, and to call between visits if there is a concern that blood sugar is uncontrolled is also discussed.   Amy Houston is reminded of the importance of daily foot exam, annual eye examination, and good blood sugar, blood pressure and cholesterol control.     Latest Ref Rng & Units 08/22/2023    9:25 AM 06/07/2023   10:57 AM 01/23/2023   10:28 AM 09/06/2022    8:42 AM 08/17/2022   11:02 AM  Diabetic Labs  HbA1c 4.8 - 5.6 %  6.9  6.6  6.5    Micro/Creat Ratio 0 - 29 mg/g creat  7      Chol 100 - 199 mg/dL  866   886    HDL >60 mg/dL  45   43    Calc LDL 0 - 99 mg/dL  63   49    Triglycerides 0 - 149 mg/dL  857   880    Creatinine 0.44 - 1.00 mg/dL 9.06  8.89  9.07  9.07  0.96       09/05/2023    3:46 PM 08/29/2023   10:48 AM 08/21/2023   10:27 AM 08/21/2023    8:29 AM 07/03/2023   10:11 AM 06/08/2023    4:22 PM 05/07/2023    3:14 PM  BP/Weight  Systolic BP 120 145 174 132 134 119 144  Diastolic BP 59 57 57 54 47 69 73  Wt. (Lbs) 161.12 157.63  155 159.8 156.12   BMI 33.67 kg/m2 32.94 kg/m2  32.4 kg/m2 33.4 kg/m2 32.63 kg/m2       Latest Ref Rng & Units 06/01/2022   12:00 AM 04/21/2021   10:00 AM  Foot/eye exam completion dates  Eye Exam No Retinopathy No Retinopathy       Foot Form Completion   Done     This result is from an external source.

## 2023-09-11 NOTE — Progress Notes (Signed)
 Amy Houston     MRN: [id number]      DOB: 04-19-1945  Chief Complaint  Patient presents with   Hypertension    Follow up     HPI Amy Houston is here for follow up and re-evaluation of chronic medical conditions, medication management and review of any available recent lab and radiology data.  Preventive health is updated, specifically  Cancer screening and Immunization.   Questions or concerns regarding consultations or procedures which the PT has had in the interim are  addressed. The PT denies any adverse reactions to current medications since the last visit.  Increased irritability on lower dose effexor  wantsdose to be increased to previous one  ROS Denies recent fever or chills. Denies sinus pressure, nasal congestion, ear pain or sore throat. Denies chest congestion, productive cough or wheezing. Denies chest pains, palpitations and leg swelling Denies abdominal pain, nausea, vomiting,diarrhea or constipation.   Denies dysuria, frequency, hesitancy or incontinence. Denies joint pain, swelling and limitation in mobility. Denies headaches, seizures, numbness, or tingling. . Denies skin break down or rash.   PE  BP (!) 120/59   Pulse 83   Resp 16   Ht 4' 10 (1.473 m)   Wt 161 lb 1.9 oz (73.1 kg)   SpO2 (!) 83%   BMI 33.67 kg/m   Patient alert and oriented and in no cardiopulmonary distress.  HEENT: No facial asymmetry, EOMI,     Neck supple .  Chest: Clear to auscultation bilaterally.  CVS: S1, S2 no murmurs, no S3.Regular rate.  ABD: Soft non tender.   Ext: No edema  MS: Adequate ROM spine, shoulders, hips and knees.  Skin: Intact, no ulcerations or rash noted.  Psych: Good eye contact, normal affect. Memory intact not anxious or depressed appearing.  CNS: CN 2-12 intact, power,  normal throughout.no focal deficits noted.   Assessment & Plan  Anxiety Adequately corrected his Effexor  dose to 1 mg twice daily.  Hyperlipidemia LDL goal  <100 Hyperlipidemia:Low fat diet discussed and encouraged.   Lipid Panel  Lab Results  Component Value Date   CHOL 133 06/07/2023   HDL 45 06/07/2023   LDLCALC 63 06/07/2023   LDLDIRECT 71 02/26/2020   TRIG 142 06/07/2023   CHOLHDL 3.0 06/07/2023     Controlled, no change in medication   Primary hypertension Controlled, no change in medication DASH diet and commitment to daily physical activity for a minimum of 30 minutes discussed and encouraged, as a part of hypertension management. The importance of attaining a healthy weight is also discussed.     09/05/2023    3:46 PM 08/29/2023   10:48 AM 08/21/2023   10:27 AM 08/21/2023    8:29 AM 07/03/2023   10:11 AM 06/08/2023    4:22 PM 05/07/2023    3:14 PM  BP/Weight  Systolic BP 120 145 174 132 134 119 144  Diastolic BP 59 57 57 54 47 69 73  Wt. (Lbs) 161.12 157.63  155 159.8 156.12   BMI 33.67 kg/m2 32.94 kg/m2  32.4 kg/m2 33.4 kg/m2 32.63 kg/m2        Type 2 diabetes mellitus with other specified complication (HCC) Diabetes associated with hypertension, hyperlipidemia, arthritis, and depression  Amy Houston is reminded of the importance of commitment to daily physical activity for 30 minutes or more, as able and the need to limit carbohydrate intake to 30 to 60 grams per meal to help with blood sugar control.   The need to take  medication as prescribed, test blood sugar as directed, and to call between visits if there is a concern that blood sugar is uncontrolled is also discussed.   Amy Houston is reminded of the importance of daily foot exam, annual eye examination, and good blood sugar, blood pressure and cholesterol control.     Latest Ref Rng & Units 08/22/2023    9:25 AM 06/07/2023   10:57 AM 01/23/2023   10:28 AM 09/06/2022    8:42 AM 08/17/2022   11:02 AM  Diabetic Labs  HbA1c 4.8 - 5.6 %  6.9  6.6  6.5    Micro/Creat Ratio 0 - 29 mg/g creat  7      Chol 100 - 199 mg/dL  866   886    HDL >60 mg/dL  45   43    Calc  LDL 0 - 99 mg/dL  63   49    Triglycerides 0 - 149 mg/dL  857   880    Creatinine 0.44 - 1.00 mg/dL 9.06  8.89  9.07  9.07  0.96       09/05/2023    3:46 PM 08/29/2023   10:48 AM 08/21/2023   10:27 AM 08/21/2023    8:29 AM 07/03/2023   10:11 AM 06/08/2023    4:22 PM 05/07/2023    3:14 PM  BP/Weight  Systolic BP 120 145 174 132 134 119 144  Diastolic BP 59 57 57 54 47 69 73  Wt. (Lbs) 161.12 157.63  155 159.8 156.12   BMI 33.67 kg/m2 32.94 kg/m2  32.4 kg/m2 33.4 kg/m2 32.63 kg/m2       Latest Ref Rng & Units 06/01/2022   12:00 AM 04/21/2021   10:00 AM  Foot/eye exam completion dates  Eye Exam No Retinopathy No Retinopathy       Foot Form Completion   Done     This result is from an external source.        Immunization due After obtaining informed consent, the influenza  vaccine is  administered , with no adverse effect noted at the time of administration.

## 2023-09-11 NOTE — Assessment & Plan Note (Signed)
 Adequately corrected his Effexor  dose to 1 mg twice daily.

## 2023-09-11 NOTE — Assessment & Plan Note (Signed)
 Hyperlipidemia:Low fat diet discussed and encouraged.   Lipid Panel  Lab Results  Component Value Date   CHOL 133 06/07/2023   HDL 45 06/07/2023   LDLCALC 63 06/07/2023   LDLDIRECT 71 02/26/2020   TRIG 142 06/07/2023   CHOLHDL 3.0 06/07/2023     Controlled, no change in medication

## 2023-09-12 DIAGNOSIS — E1169 Type 2 diabetes mellitus with other specified complication: Secondary | ICD-10-CM | POA: Diagnosis not present

## 2023-09-13 LAB — HEMOGLOBIN A1C
Est. average glucose Bld gHb Est-mCnc: 146 mg/dL
Hgb A1c MFr Bld: 6.7 % — ABNORMAL HIGH (ref 4.8–5.6)

## 2023-10-01 ENCOUNTER — Ambulatory Visit: Payer: Self-pay

## 2023-10-01 NOTE — Telephone Encounter (Signed)
 Pt triaged, see earlier encounter for today.

## 2023-10-01 NOTE — Telephone Encounter (Signed)
 scheduled

## 2023-10-01 NOTE — Telephone Encounter (Signed)
 FYI Only or Action Required?: Action required by provider: clinical question for provider.  Patient was last seen in primary care on 09/05/2023 by [name], MD.  Called Nurse Triage reporting Sinusitis.  Symptoms began several days ago.  Interventions attempted: Rest, hydration, or home remedies.  Symptoms are: unchanged.  Triage Disposition: Home Care  Patient/caregiver understands and will follow disposition?: No, wishes to speak with PCP     Reason for Disposition  [1] Sinus congestion as part of a cold AND [2] present < 10 days  MILD-MODERATE diarrhea (e.g., 1-6 times / day more than normal)  Answer Assessment - Initial Assessment Questions Additional info: Patient states she typically needs Z pac for sinus symptoms once or twice per year . Requesting z pac and Zofran  to [facility] today. Declines acute office visit.    1. LOCATION: Where does it hurt?      sinuses 2. ONSET: When did the sinus pain start?  (e.g., hours, days)      4 days 3. SEVERITY: How bad is the pain?   (Scale 0-10; or none, mild, moderate or severe)     Mild 4. RECURRENT SYMPTOM: Have you ever had sinus problems before? If Yes, ask: When was the last time? and What happened that time?      yes 5. NASAL CONGESTION: Is the nose blocked? If Yes, ask: Can you open it or must you breathe through your mouth?     yes 6. NASAL DISCHARGE: Do you have discharge from your nose? If so ask, What color?     runny 7. FEVER: Do you have a fever? If Yes, ask: What is it, how was it measured, and when did it start?      denies 8. OTHER SYMPTOMS: Do you have any other symptoms? (e.g., sore throat, cough, earache, difficulty breathing)     Scratchy throat, vomiting, diarrhea  Answer Assessment - Initial Assessment Questions 1. DIARRHEA SEVERITY: How bad is the diarrhea? How many more stools have you had in the past 24 hours than normal?      mild 2. ONSET: When did the  diarrhea begin?      4 days ago 3. STOOL DESCRIPTION:  How loose or watery is the diarrhea? What is the stool color? Is there any blood or mucous in the stool?     No blood or mucous 4. VOMITING: Are you also vomiting? If Yes, ask: How many times in the past 24 hours?      Yes, 1 or 2, nausea 5. ABDOMEN PAIN: Are you having any abdomen pain? If Yes, ask: What does it feel like? (e.g., crampy, dull, intermittent, constant)      no 6. ABDOMEN PAIN SEVERITY: If present, ask: How bad is the pain?  (e.g., Scale 1-10; mild, moderate, or severe)     0/10 7. ORAL INTAKE: If vomiting, Have you been able to drink liquids? How much liquids have you had in the past 24 hours?     Drinking fluids 8. HYDRATION: Any signs of dehydration? (e.g., dry mouth [not just dry lips], too weak to stand, dizziness, new weight loss) When did you last urinate?     hydrated 9. EXPOSURE: Have you traveled to a foreign country recently? Have you been exposed to anyone with diarrhea? Could you have eaten any food that was spoiled?     Recently returned from beach 10. ANTIBIOTIC USE: Are you taking antibiotics now or have you taken antibiotics in the past 2  months?        11. OTHER SYMPTOMS: Do you have any other symptoms? (e.g., fever, blood in stool)       Sinus symptoms  Protocols used: Sinus Pain or Congestion-A-AH, Diarrhea-A-AH

## 2023-10-03 ENCOUNTER — Encounter: Admitting: Family Medicine

## 2023-10-05 ENCOUNTER — Ambulatory Visit (INDEPENDENT_AMBULATORY_CARE_PROVIDER_SITE_OTHER): Admitting: Physician Assistant

## 2023-10-05 ENCOUNTER — Encounter: Payer: Self-pay | Admitting: Physician Assistant

## 2023-10-05 ENCOUNTER — Ambulatory Visit: Admitting: Physician Assistant

## 2023-10-05 VITALS — BP 115/65 | Ht <= 58 in | Wt 162.0 lb

## 2023-10-05 DIAGNOSIS — J0110 Acute frontal sinusitis, unspecified: Secondary | ICD-10-CM

## 2023-10-05 MED ORDER — AZITHROMYCIN 250 MG PO TABS: ORAL_TABLET | ORAL | 0 refills | Status: AC

## 2023-10-05 MED ORDER — ONDANSETRON 4 MG PO TBDP: 4.0000 mg | ORAL_TABLET | Freq: Three times a day (TID) | ORAL | 0 refills | Status: AC | PRN

## 2023-10-05 NOTE — Assessment & Plan Note (Signed)
 Presentation was consistent with sinusitis.  No evidence of other bacterial infections including pneumonia, pharyngitis, otitis media, or orbital cellulitis. Discussed that this fits the picture of viral vs bacterial sinusitis and that due to type and duration of symptoms and exam findings, we will treat as bacterial sinusitis.  Antibiotics prescribed. May continue OTC medications. Discussed supportive treatment. The patient was instructed to return if the worsens in any way, especially if not tolerating fluids, increased sinus pain or swelling, worsening headache, persistent fever, difficulty swallowing or breathing, or as needed. The patient agreed with the plan.

## 2023-10-05 NOTE — Progress Notes (Signed)
 Acute Office Visit  Subjective:     Patient ID: Amy Houston, female    DOB: [birth year], 78 y.o.   MRN: [id number]   Discussed the use of AI scribe software for clinical note transcription with the patient, who gave verbal consent to proceed.  History of Present Illness Amy Houston is a 78 year old female who presents with upper respiratory and gastrointestinal symptoms.  Symptoms began a week ago with a runny nose and headache while at the beach. Upon returning home, she developed diarrhea and nausea without vomiting. Her husband experienced vomiting but no diarrhea. She has not had a fever but continues to have headache, congestion, diarrhea, and nausea. Her headaches are localized to the forehead.  She has been using an over-the-counter liquid flu medication from [facility] and requests a refill of Zofran , having taken her last dose. She reports adequate eating, drinking, and sleeping.  Early symptoms included wheezing and a slight cough, but she denies shortness of breath or chest pain. She occasionally experiences ear pain upon waking, possibly due to teeth grinding. Congestion and headache are her primary concerns. 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   She recalls a past adverse reaction to certain antibiotics when she had gout but has tolerated Z-Pak in the past.    Review of Systems  Constitutional:  Positive for malaise/fatigue. Negative for fever and weight loss.  HENT:  Positive for congestion and sinus pain. Negative for ear pain and sore throat.   Respiratory:  Positive for cough and wheezing. Negative for shortness of breath.   Cardiovascular:  Negative for chest pain and palpitations.  Gastrointestinal:  Positive for diarrhea and nausea. Negative for vomiting.  Musculoskeletal:  Negative for myalgias.  Neurological:  Positive for headaches. Negative for dizziness.        Objective:     BP  115/65   Ht 4' 10 (1.473 m)   Wt 162 lb (73.5 kg)   BMI 33.86 kg/m   Physical Exam Vitals reviewed.  Constitutional:      General: She is not in acute distress.    Appearance: Normal appearance. She is not ill-appearing.  HENT:     Right Ear: Tympanic membrane normal.     Left Ear: Tympanic membrane normal.     Nose: Congestion present.     Right Sinus: Maxillary sinus tenderness and frontal sinus tenderness present.     Left Sinus: Maxillary sinus tenderness and frontal sinus tenderness present.     Mouth/Throat:     Mouth: Mucous membranes are moist.     Pharynx: Oropharynx is clear. No posterior oropharyngeal erythema.  Eyes:     Extraocular Movements: Extraocular movements intact.     Conjunctiva/sclera: Conjunctivae normal.  Cardiovascular:     Rate and Rhythm: Normal rate and regular rhythm.     Heart sounds: Normal heart sounds. No murmur heard. Pulmonary:     Effort: Pulmonary effort is normal.     Breath sounds: Normal breath sounds. No wheezing, rhonchi or rales.  Lymphadenopathy:     Cervical: No cervical adenopathy.  Skin:    General: Skin is warm and dry.  Neurological:     General: No focal deficit present.     Mental Status: She is alert and oriented to person, place, and time.  Psychiatric:        Mood and Affect: Mood normal.        Behavior:  Behavior normal.     No results found for any visits on 10/05/23.      Assessment & Plan:  Acute non-recurrent frontal sinusitis Assessment & Plan: Presentation was consistent with sinusitis.  No evidence of other bacterial infections including pneumonia, pharyngitis, otitis media, or orbital cellulitis. Discussed that this fits the picture of viral vs bacterial sinusitis and that due to type and duration of symptoms and exam findings, we will treat as bacterial sinusitis.  Antibiotics prescribed. May continue OTC medications. Discussed supportive treatment. The patient was instructed to return if the worsens in  any way, especially if not tolerating fluids, increased sinus pain or swelling, worsening headache, persistent fever, difficulty swallowing or breathing, or as needed. The patient agreed with the plan.   Orders: -     Azithromycin ; Take 2 tablets on day 1, then 1 tablet daily on days 2 through 5  Dispense: 6 tablet; Refill: 0  Other orders -     Ondansetron ; Take 1 tablet (4 mg total) by mouth every 8 (eight) hours as needed for nausea or vomiting.  Dispense: 20 tablet; Refill: 0    Return if symptoms worsen or fail to improve.  Charmaine Dorenda Pfannenstiel, PA-C

## 2023-11-02 ENCOUNTER — Ambulatory Visit: Payer: Self-pay | Admitting: Family Medicine

## 2023-11-09 ENCOUNTER — Other Ambulatory Visit (HOSPITAL_COMMUNITY)

## 2023-11-15 ENCOUNTER — Other Ambulatory Visit: Payer: Self-pay | Admitting: Family Medicine

## 2023-11-28 ENCOUNTER — Other Ambulatory Visit: Payer: Self-pay

## 2023-11-28 DIAGNOSIS — E1169 Type 2 diabetes mellitus with other specified complication: Secondary | ICD-10-CM

## 2023-11-28 MED ORDER — EMPAGLIFLOZIN 25 MG PO TABS: 25.0000 mg | ORAL_TABLET | Freq: Every day | ORAL | 2 refills | Status: AC

## 2023-12-13 ENCOUNTER — Encounter: Admitting: Family Medicine

## 2023-12-25 ENCOUNTER — Encounter: Admitting: Family Medicine

## 2024-01-18 ENCOUNTER — Other Ambulatory Visit (HOSPITAL_COMMUNITY)

## 2024-02-08 ENCOUNTER — Encounter: Admitting: Family Medicine

## 2024-03-03 ENCOUNTER — Encounter: Admitting: Family Medicine

## 2024-04-07 ENCOUNTER — Ambulatory Visit

## 2024-05-02 ENCOUNTER — Other Ambulatory Visit (HOSPITAL_COMMUNITY)

## 2024-08-22 ENCOUNTER — Ambulatory Visit (HOSPITAL_COMMUNITY)

## 2024-08-22 ENCOUNTER — Other Ambulatory Visit

## 2024-08-29 ENCOUNTER — Ambulatory Visit: Admitting: Oncology
